# Patient Record
Sex: Female | Born: 1970 | Race: White | Hispanic: No | Smoking: Never smoker
Health system: Southern US, Community
[De-identification: ages and names within clinical notes are randomized; demographics above are authoritative.]

## PROBLEM LIST (undated history)

## (undated) DIAGNOSIS — E8881 Metabolic syndrome: Secondary | ICD-10-CM

## (undated) DIAGNOSIS — F329 Major depressive disorder, single episode, unspecified: Secondary | ICD-10-CM

## (undated) DIAGNOSIS — B0223 Postherpetic polyneuropathy: Secondary | ICD-10-CM

## (undated) DIAGNOSIS — I428 Other cardiomyopathies: Secondary | ICD-10-CM

## (undated) DIAGNOSIS — I509 Heart failure, unspecified: Secondary | ICD-10-CM

## (undated) DIAGNOSIS — T7840XA Allergy, unspecified, initial encounter: Secondary | ICD-10-CM

## (undated) DIAGNOSIS — E119 Type 2 diabetes mellitus without complications: Secondary | ICD-10-CM

## (undated) DIAGNOSIS — Z5189 Encounter for other specified aftercare: Secondary | ICD-10-CM

## (undated) DIAGNOSIS — G43909 Migraine, unspecified, not intractable, without status migrainosus: Secondary | ICD-10-CM

## (undated) DIAGNOSIS — E118 Type 2 diabetes mellitus with unspecified complications: Secondary | ICD-10-CM

## (undated) DIAGNOSIS — R9430 Abnormal result of cardiovascular function study, unspecified: Secondary | ICD-10-CM

## (undated) DIAGNOSIS — M21619 Bunion of unspecified foot: Secondary | ICD-10-CM

## (undated) DIAGNOSIS — E785 Hyperlipidemia, unspecified: Secondary | ICD-10-CM

## (undated) DIAGNOSIS — K7581 Nonalcoholic steatohepatitis (NASH): Secondary | ICD-10-CM

## (undated) DIAGNOSIS — E669 Obesity, unspecified: Secondary | ICD-10-CM

## (undated) DIAGNOSIS — R000 Tachycardia, unspecified: Secondary | ICD-10-CM

## (undated) DIAGNOSIS — K219 Gastro-esophageal reflux disease without esophagitis: Secondary | ICD-10-CM

## (undated) DIAGNOSIS — F3289 Other specified depressive episodes: Secondary | ICD-10-CM

## (undated) DIAGNOSIS — D239 Other benign neoplasm of skin, unspecified: Secondary | ICD-10-CM

## (undated) DIAGNOSIS — M1990 Unspecified osteoarthritis, unspecified site: Secondary | ICD-10-CM

## (undated) HISTORY — DX: Other specified depressive episodes: F32.89

## (undated) HISTORY — DX: Major depressive disorder, single episode, unspecified: F32.9

## (undated) HISTORY — DX: Nonalcoholic steatohepatitis (NASH): K75.81

## (undated) HISTORY — DX: Unspecified osteoarthritis, unspecified site: M19.90

## (undated) HISTORY — DX: Abnormal result of cardiovascular function study, unspecified: R94.30

## (undated) HISTORY — DX: Bunion of unspecified foot: M21.619

## (undated) HISTORY — DX: Type 2 diabetes mellitus with unspecified complications: E11.8

## (undated) HISTORY — PX: OTHER SURGICAL HISTORY: SHX169

## (undated) HISTORY — DX: Allergy, unspecified, initial encounter: T78.40XA

## (undated) HISTORY — DX: Metabolic syndrome: E88.810

## (undated) HISTORY — DX: Encounter for other specified aftercare: Z51.89

## (undated) HISTORY — DX: Gastro-esophageal reflux disease without esophagitis: K21.9

## (undated) HISTORY — DX: Type 2 diabetes mellitus without complications: E11.9

## (undated) HISTORY — DX: Other cardiomyopathies: I42.8

## (undated) HISTORY — DX: Tachycardia, unspecified: R00.0

## (undated) HISTORY — PX: CARDIAC CATHETERIZATION: SHX172

## (undated) HISTORY — DX: Heart failure, unspecified: I50.9

## (undated) HISTORY — DX: Metabolic syndrome: E88.81

## (undated) HISTORY — DX: Other benign neoplasm of skin, unspecified: D23.9

## (undated) HISTORY — DX: Obesity, unspecified: E66.9

## (undated) HISTORY — PX: BUNIONECTOMY: SHX129

## (undated) HISTORY — DX: Hyperlipidemia, unspecified: E78.5

## (undated) HISTORY — DX: Migraine, unspecified, not intractable, without status migrainosus: G43.909

---

## 1999-02-20 DIAGNOSIS — I428 Other cardiomyopathies: Secondary | ICD-10-CM

## 1999-02-20 HISTORY — DX: Other cardiomyopathies: I42.8

## 2005-04-04 ENCOUNTER — Ambulatory Visit: Payer: Self-pay | Admitting: Family Medicine

## 2005-04-10 ENCOUNTER — Ambulatory Visit: Payer: Self-pay | Admitting: Family Medicine

## 2005-06-22 ENCOUNTER — Ambulatory Visit: Payer: Self-pay | Admitting: Cardiology

## 2006-01-16 ENCOUNTER — Ambulatory Visit: Payer: Self-pay | Admitting: Cardiology

## 2006-02-12 ENCOUNTER — Ambulatory Visit: Payer: Self-pay

## 2006-02-12 ENCOUNTER — Encounter: Payer: Self-pay | Admitting: Cardiology

## 2006-02-15 ENCOUNTER — Ambulatory Visit: Payer: Self-pay | Admitting: Cardiology

## 2006-03-01 ENCOUNTER — Ambulatory Visit: Payer: Self-pay | Admitting: Family Medicine

## 2007-03-07 ENCOUNTER — Ambulatory Visit: Payer: Self-pay | Admitting: Cardiology

## 2007-04-10 ENCOUNTER — Telehealth (INDEPENDENT_AMBULATORY_CARE_PROVIDER_SITE_OTHER): Payer: Self-pay | Admitting: *Deleted

## 2007-05-23 LAB — CONVERTED CEMR LAB

## 2008-05-06 ENCOUNTER — Ambulatory Visit: Payer: Self-pay | Admitting: Cardiology

## 2008-05-14 ENCOUNTER — Encounter: Payer: Self-pay | Admitting: Cardiology

## 2008-05-14 ENCOUNTER — Ambulatory Visit: Payer: Self-pay

## 2008-05-14 ENCOUNTER — Ambulatory Visit: Payer: Self-pay | Admitting: Cardiology

## 2008-05-14 LAB — CONVERTED CEMR LAB
AST: 26 units/L (ref 0–37)
Bilirubin, Direct: 0.1 mg/dL (ref 0.0–0.3)
CO2: 27 meq/L (ref 19–32)
Chloride: 104 meq/L (ref 96–112)
Creatinine, Ser: 0.6 mg/dL (ref 0.4–1.2)
GFR calc Af Amer: 145 mL/min
GFR calc non Af Amer: 120 mL/min
Potassium: 4.1 meq/L (ref 3.5–5.1)
Triglycerides: 360 mg/dL (ref 0–149)
VLDL: 72 mg/dL — ABNORMAL HIGH (ref 0–40)

## 2008-06-11 ENCOUNTER — Ambulatory Visit: Payer: Self-pay | Admitting: Cardiology

## 2008-07-16 ENCOUNTER — Encounter (INDEPENDENT_AMBULATORY_CARE_PROVIDER_SITE_OTHER): Payer: Self-pay | Admitting: *Deleted

## 2008-07-17 ENCOUNTER — Ambulatory Visit: Payer: Self-pay | Admitting: Cardiology

## 2008-08-31 ENCOUNTER — Telehealth: Payer: Self-pay | Admitting: Cardiology

## 2008-10-12 ENCOUNTER — Ambulatory Visit: Payer: Self-pay | Admitting: Cardiology

## 2008-10-12 DIAGNOSIS — E8770 Fluid overload, unspecified: Secondary | ICD-10-CM | POA: Insufficient documentation

## 2008-10-13 LAB — CONVERTED CEMR LAB
BUN: 14 mg/dL (ref 6–23)
CO2: 27 meq/L (ref 19–32)
Calcium: 9.5 mg/dL (ref 8.4–10.5)
Chloride: 107 meq/L (ref 96–112)
Creatinine, Ser: 0.8 mg/dL (ref 0.4–1.2)
Glucose, Bld: 151 mg/dL — ABNORMAL HIGH (ref 70–99)
Potassium: 4.6 meq/L (ref 3.5–5.1)
Sodium: 140 meq/L (ref 135–145)

## 2008-10-14 DIAGNOSIS — R072 Precordial pain: Secondary | ICD-10-CM

## 2008-10-15 ENCOUNTER — Ambulatory Visit: Payer: Self-pay | Admitting: Cardiology

## 2008-10-15 DIAGNOSIS — I498 Other specified cardiac arrhythmias: Secondary | ICD-10-CM

## 2009-01-11 ENCOUNTER — Encounter: Payer: Self-pay | Admitting: Cardiology

## 2009-01-12 ENCOUNTER — Ambulatory Visit: Payer: Self-pay | Admitting: Cardiology

## 2009-04-09 ENCOUNTER — Encounter (INDEPENDENT_AMBULATORY_CARE_PROVIDER_SITE_OTHER): Payer: Self-pay | Admitting: *Deleted

## 2009-07-09 ENCOUNTER — Ambulatory Visit: Payer: Self-pay | Admitting: Cardiology

## 2009-07-16 LAB — CONVERTED CEMR LAB
Basophils Absolute: 0.1 10*3/uL (ref 0.0–0.1)
Creatinine, Ser: 0.7 mg/dL (ref 0.4–1.2)
GFR calc non Af Amer: 99.61 mL/min (ref >60)
HCT: 41.5 % (ref 36.0–46.0)
Hemoglobin: 14 g/dL (ref 12.0–15.0)
MCHC: 33.7 g/dL (ref 30.0–36.0)
MCV: 90.7 fL (ref 78.0–100.0)
Monocytes Relative: 6 % (ref 3.0–12.0)
Platelets: 361 10*3/uL (ref 150.0–400.0)
Potassium: 4.5 meq/L (ref 3.5–5.1)
RDW: 13.7 % (ref 11.5–14.6)
Sodium: 138 meq/L (ref 135–145)
WBC: 13.6 10*3/uL — ABNORMAL HIGH (ref 4.5–10.5)

## 2009-07-22 ENCOUNTER — Ambulatory Visit (HOSPITAL_COMMUNITY): Admission: RE | Admit: 2009-07-22 | Discharge: 2009-07-22 | Payer: Self-pay | Admitting: Cardiology

## 2009-07-22 ENCOUNTER — Ambulatory Visit: Payer: Self-pay | Admitting: Internal Medicine

## 2009-07-22 ENCOUNTER — Ambulatory Visit: Payer: Self-pay

## 2009-07-22 ENCOUNTER — Encounter: Payer: Self-pay | Admitting: Cardiology

## 2009-08-11 ENCOUNTER — Encounter: Payer: Self-pay | Admitting: Cardiology

## 2009-08-12 ENCOUNTER — Ambulatory Visit: Payer: Self-pay | Admitting: Cardiology

## 2009-08-12 LAB — CONVERTED CEMR LAB
BUN: 11 mg/dL (ref 6–23)
CO2: 30 meq/L (ref 19–32)
Calcium: 9.1 mg/dL (ref 8.4–10.5)
Chloride: 102 meq/L (ref 96–112)
Creatinine, Ser: 0.8 mg/dL (ref 0.4–1.2)
GFR calc non Af Amer: 85.34 mL/min (ref >60)
Glucose, Bld: 153 mg/dL — ABNORMAL HIGH (ref 70–99)
Potassium: 4.2 meq/L (ref 3.5–5.1)
Sodium: 140 meq/L (ref 135–145)

## 2009-10-07 ENCOUNTER — Ambulatory Visit: Payer: Self-pay | Admitting: Internal Medicine

## 2009-10-07 DIAGNOSIS — E669 Obesity, unspecified: Secondary | ICD-10-CM

## 2009-10-07 DIAGNOSIS — G43909 Migraine, unspecified, not intractable, without status migrainosus: Secondary | ICD-10-CM

## 2009-10-07 DIAGNOSIS — E785 Hyperlipidemia, unspecified: Secondary | ICD-10-CM | POA: Insufficient documentation

## 2009-10-07 DIAGNOSIS — M129 Arthropathy, unspecified: Secondary | ICD-10-CM | POA: Insufficient documentation

## 2009-10-07 DIAGNOSIS — F329 Major depressive disorder, single episode, unspecified: Secondary | ICD-10-CM

## 2009-10-07 DIAGNOSIS — Z9189 Other specified personal risk factors, not elsewhere classified: Secondary | ICD-10-CM | POA: Insufficient documentation

## 2009-10-07 DIAGNOSIS — E8881 Metabolic syndrome: Secondary | ICD-10-CM

## 2009-10-08 LAB — CONVERTED CEMR LAB
Cholesterol: 202 mg/dL — ABNORMAL HIGH (ref 0–200)
Direct LDL: 132 mg/dL
HDL: 36.9 mg/dL — ABNORMAL LOW (ref >39.00)
Hgb A1c MFr Bld: 6.3 % (ref 4.6–6.5)
Rheumatoid fact SerPl-aCnc: 23.7 intl units/mL — ABNORMAL HIGH (ref 0.0–20.0)
TSH: 2.81 microintl units/mL (ref 0.35–5.50)
Total CHOL/HDL Ratio: 5
Triglycerides: 366 mg/dL — ABNORMAL HIGH (ref 0.0–149.0)
VLDL: 73.2 mg/dL — ABNORMAL HIGH (ref 0.0–40.0)

## 2009-12-22 ENCOUNTER — Telehealth: Payer: Self-pay | Admitting: Cardiology

## 2009-12-23 ENCOUNTER — Ambulatory Visit: Payer: Self-pay | Admitting: Internal Medicine

## 2009-12-24 LAB — CONVERTED CEMR LAB
AST: 22 units/L (ref 0–37)
Albumin: 3.9 g/dL (ref 3.5–5.2)
Cholesterol: 167 mg/dL (ref 0–200)
Direct LDL: 96.5 mg/dL
Total CHOL/HDL Ratio: 5
Total Protein: 7.2 g/dL (ref 6.0–8.3)
Triglycerides: 271 mg/dL — ABNORMAL HIGH (ref 0.0–149.0)
VLDL: 54.2 mg/dL — ABNORMAL HIGH (ref 0.0–40.0)

## 2009-12-27 ENCOUNTER — Ambulatory Visit: Payer: Self-pay | Admitting: Internal Medicine

## 2010-01-10 ENCOUNTER — Ambulatory Visit: Payer: Self-pay | Admitting: Internal Medicine

## 2010-01-10 DIAGNOSIS — D239 Other benign neoplasm of skin, unspecified: Secondary | ICD-10-CM | POA: Insufficient documentation

## 2010-01-10 LAB — CONVERTED CEMR LAB: Hgb A1c MFr Bld: 6.2 % (ref 4.6–6.5)

## 2010-04-26 ENCOUNTER — Telehealth: Payer: Self-pay | Admitting: Internal Medicine

## 2010-05-22 HISTORY — PX: BUNIONECTOMY: SHX129

## 2010-06-21 NOTE — Assessment & Plan Note (Signed)
Summary: NEW/ MEDICARE/MEDCOST/[place]  #   Vital Signs:  Patient profile:   40 year old female Height:      67 inches (170.18 cm) Weight:      256.8 pounds (116.73 kg) O2 Sat:      94 % on Room air Temp:     97.1 degrees F (36.17 degrees C) oral Pulse rate:   96 / minute BP sitting:   100 / 72  (left arm) Cuff size:   large  Vitals Entered By: Tomma Lightning (Oct 07, 2009 9:20 AM)  O2 Flow:  Room air CC: New patient Is Patient Diabetic? No Pain Assessment Patient in pain? no        Primary Care Provider:  Rowe Clack MD  CC:  New patient.  History of Present Illness: new pt to me and our division, here to est care -  1) dyslipidemia - hx high TG 04/2008 - no f/u since then - not faithful with low fat diet - never on med or otc tx for same -   2) arthritis - onset >6 mos ago - pain is worse with cold weather and activity - pain affects bilateral MCP and bilateral knees R>L - no trauma or injury recalled, not assoc with any swelling- +FH both RA(mom) and OA (g-mom) - pain improved with "gloves" while knitting to keep hands warm and occ tylenol use  3) hyperglycemia - ?metabolic syndrome - +FH DM but never personally dx - weight stable, not exercising - no PU or PD -   4) CM hx - follows with cards for same - no edema or SOB - no recent need for med changes  5) depression - follows with psyc provider for same - reports compliance with ongoing medical treatment and no changes in medication dose or frequency. denies adverse side effects related to current therapy. no si or sadness   Preventive Screening-Counseling & Management  Alcohol-Tobacco     Alcohol drinks/day: <1     Alcohol Counseling: not indicated; use of alcohol is not excessive or problematic     Smoking Status: never     Tobacco Counseling: not indicated; no tobacco use  Caffeine-Diet-Exercise     Diet Counseling: to improve diet; diet is suboptimal     Nutrition Referrals: no     Does Patient Exercise:  no     Exercise Counseling: to improve exercise regimen     Depression Counseling: not indicated; screening negative for depression  Safety-Violence-Falls     Seat Belt Use: yes     Seat Belt Counseling: not indicated; patient wears seat belts     Helmet Counseling: not indicated; patient wears helmet when riding bicycle/motocycle     Firearms in the Home: no firearms in the home     Firearm Counseling: not applicable     Smoke Detectors: yes     Smoke Detector Counseling: no     Violence Counseling: not indicated; no violence risk noted     Fall Risk Counseling: not indicated; no significant falls noted  Clinical Review Panels:  Prevention   Last Pap Smear:  Interpretation/ Result:Negative for intraepithelial Lesion or Malignancy.    (05/23/2007)  Immunizations   Last Tetanus Booster:  Historical (05/22/2008)   Last Flu Vaccine:  HISTORICAL (04/10/2007)   Last Pneumovax:  Historical (05/23/1999)  Lipid Management   Cholesterol:  191 (05/14/2008)   LDL (bad choesterol):  DEL (05/14/2008)   HDL (good cholesterol):  34.9 (05/14/2008)  CBC  WBC:  13.6 (07/09/2009)   RBC:  4.57 (07/09/2009)   Hgb:  14.0 (07/09/2009)   Hct:  41.5 (07/09/2009)   Platelets:  361.0 (07/09/2009)   MCV  90.7 (07/09/2009)   MCHC  33.7 (07/09/2009)   RDW  13.7 (07/09/2009)   PMN:  61.2 (07/09/2009)   Lymphs:  29.7 (07/09/2009)   Monos:  6.0 (07/09/2009)   Eosinophils:  2.3 (07/09/2009)   Basophil:  0.8 (07/09/2009)  Complete Metabolic Panel   Glucose:  153 (08/12/2009)   Sodium:  140 (08/12/2009)   Potassium:  4.2 (08/12/2009)   Chloride:  102 (08/12/2009)   CO2:  30 (08/12/2009)   BUN:  11 (08/12/2009)   Creatinine:  0.8 (08/12/2009)   Albumin:  3.4 (05/14/2008)   Total Protein:  6.8 (05/14/2008)   Calcium:  9.1 (08/12/2009)   Total Bili:  0.7 (05/14/2008)   Alk Phos:  61 (05/14/2008)   SGPT (ALT):  25 (05/14/2008)   SGOT (AST):  26 (05/14/2008)   Current Medications  (verified): 1)  Furosemide 80 Mg Tabs (Furosemide) .... Take 1 Tablet By Mouth Once A Day 2)  Digitek 0.125 Mg  Tabs (Digoxin) .... Take One Tablet By Mouth Once Daily 3)  Lisinopril 20 Mg Tabs (Lisinopril) .... Take One Tablet By Mouth Daily 4)  Spironolactone 25 Mg Tabs (Spironolactone) .... Take One Tablet By Mouth Daily 5)  Carvedilol 25 Mg Tabs (Carvedilol) .... Take Two Tablets By Mouth Twice A Day 6)  Calcium Carbonate-Vitamin D 600-400 Mg-Unit  Tabs (Calcium Carbonate-Vitamin D) .... Once Daily 7)  Vitamin C 500 Mg  Tabs (Ascorbic Acid) .... Once Daily 8)  Fluoxetine Hcl 40 Mg Caps (Fluoxetine Hcl) .... 2 Tabs Once Daily 9)  Lamictal 200 Mg Tabs (Lamotrigine) .... Once Daily 10)  Klor-Con M20 20 Meq Cr-Tabs (Potassium Chloride Crys Cr) .Marland Kitchen.. 1 Tab Once Daily 11)  Zyrtec Allergy 10 Mg Tabs (Cetirizine Hcl) .... Once Daily 12)  Glucosamine-Chondroitin   Caps (Glucosamine-Chondroit-Vit C-Mn) .... Take One Tablet By Mouth Two Times A Day  Allergies: 1)  ! Sulfa  Past History:  Past Medical History: Migraines Cardiomyopathy. EF 20% in October 2000,  improved over time.echocardiogram    September 2007-EF 50-55%---EF 04/2008 .Marland Kitchen35-40%  /  EF 40-45%...echo...07/22/2009 Chest pain 2007 with normal Cardiolite. History of normal coronary arteries in 2000 Depression arthritis -hands, knees r>l metabolic syndrome - hypertriglycerides 04/2008, hyperglycemia  [facility] rooster; cards - [facility] - [facility] psyc -polus - [facility]  Past Surgical History: Denies surgical history  Family History: Family History of Arthritis (parent, grandparent) Family History Diabetes 1st degree relative (dad, mom) Family History High cholesterol (parent) Family History Hypertension (parent) Family History Ovarian cancer (mom, benign mass)  mom expired age 44 - [place], DM - sepsis from lymphedema? dad expired age 93 - [place], smoker, DM, bipolar schizophrenia  Social History: Never Smoked married, lives with  spouse, dtr and college friend rare alcohol use disabled Smoking Status:  never Does Patient Exercise:  no Seat Belt Use:  yes  Review of Systems       see HPI above. I have reviewed all other systems and they were negative.   Physical Exam  General:  overweight-appearing.  alert, well-developed, well-nourished, and cooperative to examination.    Head:  Normocephalic and atraumatic without obvious abnormalities. No apparent alopecia or balding. Eyes:  vision grossly intact; pupils equal, round and reactive to light.  conjunctiva and lids normal.    Ears:  normal pinnae bilaterally, without erythema,  swelling, or tenderness to palpation. TMs clear, without effusion, or cerumen impaction. Hearing grossly normal bilaterally  Mouth:  teeth and gums in good repair; mucous membranes moist, without lesions or ulcers. oropharynx clear without exudate, no erythema.  Neck:  supple, full ROM, no masses, no thyromegaly; no thyroid nodules or tenderness. no JVD or carotid bruits.   Lungs:  normal respiratory effort, no intercostal retractions or use of accessory muscles; normal breath sounds bilaterally - no crackles and no wheezes.    Heart:  normal rate, regular rhythm, no murmur, and no rub. BLE without edema. normal DP pulses and normal cap refill in all 4 extremities    Abdomen:  soft, non-tender, normal bowel sounds, no distention; no masses and no appreciable hepatomegaly or splenomegaly.   Genitalia:  defer to gyn Msk:  very mild MCP boggy changed 2/3 on left hand - no other effusiions - no warmth or erythema - FROM with ext and flexion - bilater knee: full range of motion, no joint effusion or swelling. no erythema or abnormal warmth. Stable to ligamentous testing. Nontender to palpation. Neurovascularly intact.  Neurologic:  alert & oriented X3 and cranial nerves II-XII symetrically intact.  strength normal in all extremities, sensation intact to light touch, and gait normal. speech fluent  without dysarthria or aphasia; follows commands with good comprehension.  Skin:  no rashes, vesicles, ulcers, or erythema. No nodules or irregularity to palpation.  Psych:  Oriented X3, memory intact for recent and remote, normally interactive, good eye contact, not anxious appearing, not depressed appearing, and not agitated.      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 see below - Time spent with patient 4) minutes, more than 50% of this time was spent counseling patient onsyndrome + need for diet and exercise with weight loss to control same (even if meds needed for clarified dx depending on lab results)  Problem # 2:  DYSLIPIDEMIA (ICD-272.4)  Orders: TLB-Lipid Panel (80061-LIPID)  Labs Reviewed: SGOT: 26 (05/14/2008)   SGPT: 25 (05/14/2008)   HDL:34.9 (05/14/2008)  LDL:DEL (05/14/2008)  Chol:191 (05/14/2008)  Trig:360 (05/14/2008)  Problem # 3:  HYPERGLYCEMIA, BORDERLINE (ICD-790.29)  Orders: TLB-A1C / Hgb A1C (Glycohemoglobin) (83036-A1C)  Labs Reviewed: Creat: 0.8 (08/12/2009)     Problem # 4:  OBESITY (ICD-278.00)  Orders: TLB-TSH (Thyroid Stimulating Hormone) (84443-TSH)  Ht: 67 (10/07/2009)   Wt: 256.8 (10/07/2009)   BMI: 41.18 (08/12/2009)  Problem # 5:  ARTHRITIS (ICD-716.90) exam benign but note FH RA - suspect mild DJD if any - check xray now and labs cont tylenol and "warmth/gloves" to maximize conserv mgmt at this time avoid NSAIDs given CM and CHF hx Orders: TLB-Rheumatoid Factor (RA) (86431-RA) T-Hand Left 3 Views (73130TC) T-Hand Right 3 views (73130TC) T-Knee Left 2 view (73560TC) T-Knee Right 2 view (73560TC)  Problem # 6:  DEPRESSION (ICD-311) well controlled -  cont same med and counseling as per psyc providers Her updated medication list for this problem includes:    Fluoxetine Hcl 40 Mg Caps (Fluoxetine hcl) .Marland Kitchen... 2 tabs once daily  Orders: TLB-TSH  (Thyroid Stimulating Hormone) (84443-TSH)  Problem # 7:  CARDIOMYOPATHY (ICD-425.4) per cards 07/2009 OV: Her updated medication list for this problem includes:    Furosemide 80 Mg Tabs (Furosemide) .Marland Kitchen... Take 1 tablet by mouth once a day    Digitek 0.125 Mg Tabs (Digoxin) .Marland Kitchen... Take one tablet by mouth once daily  Lisinopril 20 Mg Tabs (Lisinopril) .Marland Kitchen... Take one tablet by mouth daily    Spironolactone 25 Mg Tabs (Spironolactone) .Marland Kitchen... Take one tablet by mouth daily    Carvedilol 25 Mg Tabs (Carvedilol) .Marland Kitchen... Take two tablets by mouth twice a day The patient's followup echo revealed an ejection fraction of 40-45%.  I am pleased with his result.  We will continue all of her medications.  Complete Medication List: 1)  Furosemide 80 Mg Tabs (Furosemide) .... Take 1 tablet by mouth once a day 2)  Digitek 0.125 Mg Tabs (Digoxin) .... Take one tablet by mouth once daily 3)  Lisinopril 20 Mg Tabs (Lisinopril) .... Take one tablet by mouth daily 4)  Spironolactone 25 Mg Tabs (Spironolactone) .... Take one tablet by mouth daily 5)  Carvedilol 25 Mg Tabs (Carvedilol) .... Take two tablets by mouth twice a day 6)  Calcium Carbonate-vitamin D 600-400 Mg-unit Tabs (Calcium carbonate-vitamin d) .... Once daily 7)  Vitamin C 500 Mg Tabs (Ascorbic acid) .... Once daily 8)  Fluoxetine Hcl 40 Mg Caps (Fluoxetine hcl) .... 2 tabs once daily 9)  Lamictal 200 Mg Tabs (Lamotrigine) .... Once daily 10)  Klor-con M20 20 Meq Cr-tabs (Potassium chloride crys cr) .Marland Kitchen.. 1 tab once daily 11)  Zyrtec Allergy 10 Mg Tabs (Cetirizine hcl) .... Once daily 12)  Glucosamine-chondroitin Caps (Glucosamine-chondroit-vit c-mn) .... Take one tablet by mouth two times a day  Patient Instructions: 1)  it was good to see you today.  2)  test(s) ordered today - your results will be posted on the phone tree for review in 48-72 hours from the time of test completion; call [phone] and enter your 9 digit MRN (listed above on this  page, just below your name); if any changes need to be made or there are abnormal results, you will be contacted directly.  3)  no medications changes recommended at this time but will notify you if additions needed after reviewing labs - 4)  ok to use Tylenol as needed for arthritis pains 5)  it is important that you work on losing weight - monitor your diet and consume fewer calories such as less carbohydrates (sugar) and less fat. you also need to increase your physical activity level - start by walking for 10-20 minutes 3 times per week and work up to 30 minutes 4-5 times each week.  6)  Please schedule a follow-up appointment in 3 months, sooner if problems.    Immunization History:  Tetanus/Td Immunization History:    Tetanus/Td:  historical (05/22/2008)  Pneumovax Immunization History:    Pneumovax:  historical (05/23/1999)    Pap Smear  Procedure date:  05/23/2007  Findings:      Interpretation/ Result:Negative for intraepithelial Lesion or Malignancy.

## 2010-06-21 NOTE — Progress Notes (Signed)
Summary: simvastain  Phone Note Refill Request Message from:  Fax from Pharmacy on April 26, 2010 12:22 PM  Refills Requested: Medication #1:  SIMVASTATIN 20 MG TABS 1 by mouth at bedtime. [facility]/ [place] [phone]   Method Requested: Electronic Initial call taken by: Tomma Lightning RMA,  April 26, 2010 12:22 PM    Prescriptions: SIMVASTATIN 20 MG TABS (SIMVASTATIN) 1 by mouth at bedtime  #30 x 9   Entered by:   Tomma Lightning RMA   Authorized by:   Rowe Clack MD   Signed by:   Tomma Lightning RMA on 04/26/2010   Method used:   Electronically to        [facility] [phone]* (retail)       [place], [place]  [address]       Ph: [phone]       Fax: [phone]   RxID:   [id number]

## 2010-06-21 NOTE — Miscellaneous (Signed)
  Clinical Lists Changes  Observations: Added new observation of PAST MED HX: allergy to sulfa and tetanus Migraines Cardiomyopathy. Ejection fraction 20% in October 2000 by report. This improved over time.echocardiogram September 2007-ejection fraction 50-55%----  /  -EF 04/2008 .Marland Kitchen35-40%  /  EF 40-45%...echo...07/22/2009 Chest pain 2007 with normal Cardiolite. History of normal coronary arteries in 2000 (08/11/2009 10:17) Added new observation of PRIMARY MD: none (08/11/2009 10:17)       Past History:  Past Medical History: allergy to sulfa and tetanus Migraines Cardiomyopathy. Ejection fraction 20% in October 2000 by report. This improved over time.echocardiogram September 2007-ejection fraction 50-55%----  /  -EF 04/2008 .Marland Kitchen35-40%  /  EF 40-45%...echo...07/22/2009 Chest pain 2007 with normal Cardiolite. History of normal coronary arteries in 2000

## 2010-06-21 NOTE — Progress Notes (Signed)
Summary: Pt request call  Phone Note Call from Patient Call back at Home Phone [phone]   Caller: Patient Summary of Call: Request call Initial call taken by: Delsa Sale,  December 22, 2009 1:10 PM  Follow-up for Phone Call        pt has been having a "twinge" b/t right side b/t collar bone and shoulder, just last about 1 min., has only happened 3 times in past 2 weeks, also gets a "flash of warmth" over her body when she gets the twinge, no SOB, no palps, BP 111/75 advised did not sound cardiac recommended f/u w/Dr Asa Lente pt agreeable Kevan Rosebush, RN  December 22, 2009 5:38 PM

## 2010-06-21 NOTE — Assessment & Plan Note (Signed)
Summary: rov/jss   Visit Type:  Follow-up Primary Provider:  none  CC:  cardiomyopathy.  History of Present Illness: The patient is seen for followup of cardiomyopathy.  Her carvedilol dose is 50 mg b.i.d.  She tolerates this well. She has had some increased shortness of breath.  This is with exertion.  She has not had PND orthopnea.  She is not having any significant edema but she says she may be mildly swollen.  She does watch result in today.  She does drink extra fluid including water and she will cut back.   Current Medications (verified): 1)  Furosemide 80 Mg Tabs (Furosemide) .Marland Kitchen.. 1 in The Am, 1/2 in The Pm 2)  Digitek 0.125 Mg  Tabs (Digoxin) .... Take One Tablet By Mouth Once Daily 3)  Lisinopril 20 Mg Tabs (Lisinopril) .... Take One Tablet By Mouth Daily 4)  Spironolactone 25 Mg Tabs (Spironolactone) .... Take One Tablet By Mouth Daily 5)  Carvedilol 25 Mg Tabs (Carvedilol) .... Take Two Tablets By Mouth Twice A Day 6)  Calcium Carbonate-Vitamin D 600-400 Mg-Unit  Tabs (Calcium Carbonate-Vitamin D) .... Once Daily 7)  Vitamin C 500 Mg  Tabs (Ascorbic Acid) .... Once Daily 8)  Fluoxetine Hcl 40 Mg Caps (Fluoxetine Hcl) .... 2 Tabs Once Daily 9)  Lamictal 200 Mg Tabs (Lamotrigine) .... Once Daily 10)  Klor-Con M20 20 Meq Cr-Tabs (Potassium Chloride Crys Cr) .Marland Kitchen.. 1 Tab Once Daily 11)  Zyrtec Allergy 10 Mg Tabs (Cetirizine Hcl) .... Once Daily 12)  Glucosamine-Chondroitin   Caps (Glucosamine-Chondroit-Vit C-Mn) .... Take One Tablet By Mouth Once Daily.  Allergies (verified): 1)  ! Sulfa 2)  ! * Tetanus  Past History:  Past Medical History: Last updated: 10/14/2008 allergy to sulfa and tetanus Migraines Cardiomyopathy. Ejection fraction 20% in October 2000 by report. This improved over time.echocardiogram September 2007-ejection fraction 50-55%-----EF 04/2008 .Marland Kitchen35-40% Chest pain 2007 with normal Cardiolite. History of normal coronary arteries in 2000  Review of Systems      Patient denies fever, chills, headache, sweats, rash, change in vision, change in hearing, chest pain, cough, nausea or vomiting, urinary symptoms, musculoskeletal problems.  All other systems are reviewed and are negative.  Vital Signs:  Patient profile:   40 year old female Height:      67 inches Weight:      264 pounds BMI:     41.50 Pulse rate:   90 / minute BP sitting:   112 / 68  (left arm) Cuff size:   regular  Vitals Entered By: Mignon Pine, RMA (July 09, 2009 9:59 AM)  Physical Exam  General:  patient is stable.  She is overweight. Head:  head is atraumatic. Eyes:  no xanthelasma. Neck:  no jugular venous distention. Chest Wall:  no chest wall tenderness. Lungs:  lungs are clear.  Respiratory effort is nonlabored. Heart:  cardiac exam reveals S1-S2.  No clicks or significant murmurs Abdomen:  abdomen is obese but soft. Msk:  no musculoskeletal deformities. Extremities:  there may be trace peripheral edema Skin:  no skin rashes. Psych:  patient is oriented to person time and place.  Affect is normal.   Impression & Recommendations:  Problem # 1:  SINUS TACHYCARDIA (ICD-427.89)  Her updated medication list for this problem includes:    Lisinopril 20 Mg Tabs (Lisinopril) .Marland Kitchen... Take one tablet by mouth daily    Carvedilol 25 Mg Tabs (Carvedilol) .Marland Kitchen... Take two tablets by mouth twice a day The patient's heart rate is 90  today despite carvedilol 50 mg b.i.d.  We will check thyroid functions.  This is the dose that is recommended.  I will not plan to push higher at this point.  Problem # 2:  CARDIOMYOPATHY (ICD-425.4)  Her updated medication list for this problem includes:    Furosemide 80 Mg Tabs (Furosemide) .Marland Kitchen... 1 in the am, 1/2 in the pm    Digitek 0.125 Mg Tabs (Digoxin) .Marland Kitchen... Take one tablet by mouth once daily    Lisinopril 20 Mg Tabs (Lisinopril) .Marland Kitchen... Take one tablet by mouth daily    Spironolactone 25 Mg Tabs (Spironolactone) .Marland Kitchen... Take one tablet  by mouth daily    Carvedilol 25 Mg Tabs (Carvedilol) .Marland Kitchen... Take two tablets by mouth twice a day It is time now to recheck the patient's 2-D echo.  Based on the finding I will continue to adjust her medications.  Her current shortness of breath may be related to volume overload.  Orders: TLB-BMP (Basic Metabolic Panel-BMET) ([id number]) TLB-CBC Platelet - w/Differential (85025-CBCD) TLB-TSH (Thyroid Stimulating Hormone) (84443-TSH) Echocardiogram (Echo)  Problem # 3:  PRECORDIAL PAIN (ICD-786.51)  Her updated medication list for this problem includes:    Lisinopril 20 Mg Tabs (Lisinopril) .Marland Kitchen... Take one tablet by mouth daily    Carvedilol 25 Mg Tabs (Carvedilol) .Marland Kitchen... Take two tablets by mouth twice a day The patient has not been having any chest.  Problem # 4:  FLUID OVERLOAD (ICD-276.6)  The patient's current shortness of breath may be mild volume overload.  We will start by cutting down this fluid.  I will not plan to push her diuretic as of today.  We will check a CBC, chemistry, thyroid functions.  She'll have a 2-D echo and I'll see her for followup.  Orders: TLB-BMP (Basic Metabolic Panel-BMET) ([id number]) TLB-CBC Platelet - w/Differential (85025-CBCD) TLB-TSH (Thyroid Stimulating Hormone) (84443-TSH) Echocardiogram (Echo)  Patient Instructions: 1)  Labs today--bmet, cbc, tsh 2)  Your physician has requested that you limit your fluid intake to    per day. 3)  Your physician has requested that you have an echocardiogram.  Echocardiography is a painless test that uses sound waves to create images of your heart. It provides your doctor with information about the size and shape of your heart and how well your heart's chambers and valves are working.  This procedure takes approximately one hour. There are no restrictions for this procedure. 4)  Follow up in 2 weeks

## 2010-06-21 NOTE — Assessment & Plan Note (Signed)
Summary: 2wk f/u sl   Visit Type:  Follow-up Primary Provider:  none  CC:  cardiomyopathy.  History of Present Illness: The patient is seen for followup of cardiomyopathy and fluid overload.  I saw her last July 09, 2009.  At that time we decided to proceed with a followup 2-D echo to reassess LV function.  Plan was also made for her to decrease her salt and fluid intake.  Since then she has decreased her fluid intake and in addition she increased her Lasix.  She feels much better.  We had checked labs on that day and her renal function was good.  We will need to check her chemistries again today and she is on a higher dose of diuretics.  Other labs showed that her hemoglobin was normal and her TSH was normal.  Current Medications (verified): 1)  Furosemide 80 Mg Tabs (Furosemide) .... Take 1 Tablet By Mouth Once A Day 2)  Digitek 0.125 Mg  Tabs (Digoxin) .... Take One Tablet By Mouth Once Daily 3)  Lisinopril 20 Mg Tabs (Lisinopril) .... Take One Tablet By Mouth Daily 4)  Spironolactone 25 Mg Tabs (Spironolactone) .... Take One Tablet By Mouth Daily 5)  Carvedilol 25 Mg Tabs (Carvedilol) .... Take Two Tablets By Mouth Twice A Day 6)  Calcium Carbonate-Vitamin D 600-400 Mg-Unit  Tabs (Calcium Carbonate-Vitamin D) .... Once Daily 7)  Vitamin C 500 Mg  Tabs (Ascorbic Acid) .... Once Daily 8)  Fluoxetine Hcl 40 Mg Caps (Fluoxetine Hcl) .... 2 Tabs Once Daily 9)  Lamictal 200 Mg Tabs (Lamotrigine) .... Once Daily 10)  Klor-Con M20 20 Meq Cr-Tabs (Potassium Chloride Crys Cr) .Marland Kitchen.. 1 Tab Once Daily 11)  Zyrtec Allergy 10 Mg Tabs (Cetirizine Hcl) .... Once Daily 12)  Glucosamine-Chondroitin   Caps (Glucosamine-Chondroit-Vit C-Mn) .... Take One Tablet By Mouth Two Times A Day  Allergies (verified): 1)  ! Sulfa 2)  ! * Tetanus  Past History:  Past Medical History: Last updated: 08/11/2009 allergy to sulfa and tetanus Migraines Cardiomyopathy. Ejection fraction 20% in October 2000 by  report. This improved over time.echocardiogram September 2007-ejection fraction 50-55%----  /  -EF 04/2008 .Marland Kitchen35-40%  /  EF 40-45%...echo...07/22/2009 Chest pain 2007 with normal Cardiolite. History of normal coronary arteries in 2000  Review of Systems       Patient denies fever, chills, headache, sweats, rash, change in vision, change in hearing, chest pain, cough, shortness of breath, nausea vomiting, urinary symptoms.  All other systems are reviewed and are negative.  Vital Signs:  Patient profile:   40 year old female Height:      67 inches Weight:      262 pounds BMI:     41.18 Pulse rate:   85 / minute BP sitting:   104 / 66  (left arm) Cuff size:   regular  Vitals Entered By: Mignon Pine, RMA (August 12, 2009 9:31 AM)  Physical Exam  General:  patient is quite stable today and feeling well. Eyes:  no xanthelasma. Neck:  no jugular venous distention. Lungs:  lungs are clear.  Respiratory effort is nonlabored. Heart:  cardiac exam reveals S1-S2.  No clicks or significant murmurs. Abdomen:  abdomen is soft. Extremities:  no peripheral edema. Psych:  patient is oriented to person time and place.  Affect is normal.   Impression & Recommendations:  Problem # 1:  SINUS TACHYCARDIA (ICD-427.89)  Her updated medication list for this problem includes:    Lisinopril 20 Mg Tabs (Lisinopril) .Marland KitchenMarland KitchenMarland KitchenMarland Kitchen  Take one tablet by mouth daily    Carvedilol 25 Mg Tabs (Carvedilol) .Marland Kitchen... Take two tablets by mouth twice a day The patient is on high-dose carvedilol.  Her rate is reasonable.  We will not push the dose higher.  Problem # 2:  CARDIOMYOPATHY (ICD-425.4)  Her updated medication list for this problem includes:    Furosemide 80 Mg Tabs (Furosemide) .Marland Kitchen... Take 1 tablet by mouth once a day    Digitek 0.125 Mg Tabs (Digoxin) .Marland Kitchen... Take one tablet by mouth once daily    Lisinopril 20 Mg Tabs (Lisinopril) .Marland Kitchen... Take one tablet by mouth daily    Spironolactone 25 Mg Tabs (Spironolactone)  .Marland Kitchen... Take one tablet by mouth daily    Carvedilol 25 Mg Tabs (Carvedilol) .Marland Kitchen... Take two tablets by mouth twice a day The patient's followup echo revealed an ejection fraction of 40-45%.  I am pleased with his result.  We will continue all of her medications.  Orders: TLB-BMP (Basic Metabolic Panel-BMET) (12258-TMMITVI)  Problem # 3:  FLUID OVERLOAD (ICD-276.6) Fluid status is under much better control.  Chemistry rechecked today to be sure that her renal function and potassium were stable.  Six-month followup.  Patient Instructions: 1)  Lab today 2)  Follow up in 6 months

## 2010-06-21 NOTE — Assessment & Plan Note (Signed)
Summary: f/u appt/#//cd   Vital Signs:  Patient profile:   40 year old female Height:      64 inches (162.56 cm) Weight:      246.6 pounds (112.09 kg) O2 Sat:      96 % on Room air Temp:     98.3 degrees F (36.83 degrees C) oral Pulse rate:   89 / minute BP sitting:   100 / 60  (left arm) Cuff size:   large  Vitals Entered By: Tomma Lightning RMA (January 10, 2010 1:44 PM)  O2 Flow:  Room air CC: follow-up visit Is Patient Diabetic? Yes Did you bring your meter with you today? No Pain Assessment Patient in pain? no        Primary Care Provider:  Rowe Clack MD  CC:  follow-up visit.  History of Present Illness: here for f/u  1) dyslipidemia - hx high TG 04/2008 - now faithful with low fat diet - started on statin 09/2009 for same - reports compliance with ongoing medical treatment and no changes in medication dose or frequency. denies adverse side effects related to current therapy.   2) arthritis - onset >6 mos ago - pain is worse with cold weather and activity - pain affects bilateral MCP and bilateral knees R>L - no trauma or injury recalled, not assoc with any swelling- +FH both RA(mom) and OA (g-mom) - pain improved with "gloves" while knitting to keep hands warm and occ tylenol use  3) hyperglycemia - metabolic syndrome - +FH DM but never personally dx - ongoing weight loss efforts with noted success, ongoing exercise - no PU or PD -   4) CM hx - follows with cards for same - no edema or SOB - no recent need for med changes  5) depression - follows with psyc provider for same - reports compliance with ongoing medical treatment and no changes in medication dose or frequency. denies adverse side effects related to current therapy. no si or sadness   Current Medications (verified): 1)  Furosemide 80 Mg Tabs (Furosemide) .... Take 1 Tablet By Mouth Once A Day 2)  Digitek 0.125 Mg  Tabs (Digoxin) .... Take One Tablet By Mouth Once Daily 3)  Lisinopril 20 Mg Tabs  (Lisinopril) .... Take One Tablet By Mouth Daily 4)  Spironolactone 25 Mg Tabs (Spironolactone) .... Take One Tablet By Mouth Daily 5)  Carvedilol 25 Mg Tabs (Carvedilol) .... Take Two Tablets By Mouth Twice A Day 6)  Calcium Carbonate-Vitamin D 600-400 Mg-Unit  Tabs (Calcium Carbonate-Vitamin D) .... Once Daily 7)  Vitamin C 500 Mg  Tabs (Ascorbic Acid) .... Once Daily 8)  Fluoxetine Hcl 40 Mg Caps (Fluoxetine Hcl) .... 2 Tabs Once Daily 9)  Lamictal 200 Mg Tabs (Lamotrigine) .... Once Daily 10)  Klor-Con M20 20 Meq Cr-Tabs (Potassium Chloride Crys Cr) .Marland Kitchen.. 1 Tab Once Daily 11)  Zyrtec Allergy 10 Mg Tabs (Cetirizine Hcl) .... Once Daily 12)  Glucosamine-Chondroitin   Caps (Glucosamine-Chondroit-Vit C-Mn) .... Take One Tablet By Mouth Two Times A Day 13)  Simvastatin 20 Mg Tabs (Simvastatin) .Marland Kitchen.. 1 By Mouth At Bedtime  Allergies (verified): 1)  ! Sulfa  Past History:  Past Medical History: Migraines Cardiomyopathy. EF 20% in October 2000,  improved over time.echocardiogram    September 2007-EF 50-55%---EF 04/2008 .Marland Kitchen35-40%  /  EF 40-45%...echo...07/22/2009 Chest pain 2007 with normal Cardiolite. History of normal coronary arteries in 2000 Depression arthritis -hands, knees r>l metabolic syndrome - hypertriglycerides 04/2008, hyperglycemia  Md  roster; cards - Haematologist gyn - dorn psyc -polus - [facility]  Review of Systems  The patient denies weight gain, chest pain, syncope, and headaches.         c/o hair loss and changing mole on left chest  Physical Exam  General:  overweight-appearing.  alert, well-developed, well-nourished, and cooperative to examination.    Lungs:  normal respiratory effort, no intercostal retractions or use of accessory muscles; normal breath sounds bilaterally - no crackles and no wheezes.    Heart:  normal rate, regular rhythm, no murmur, and no rub. BLE without edema. normal DP pulses and normal cap refill in all 4 extremities    Skin:  fried egg mole on  left anterior chest - also skin tags right axillea Psych:  Oriented X3, memory intact for recent and remote, normally interactive, good eye contact, not anxious appearing, not depressed appearing, and not agitated.      Impression & Recommendations:  Problem # 1:  DYSLIPIDEMIA (ICD-272.4) Assessment Improved  labs reviewed - improved on statin and with life style changes - cont same Her updated medication list for this problem includes:    Simvastatin 20 Mg Tabs (Simvastatin) .Marland Kitchen... 1 by mouth at bedtime  Orders: TLB-TSH (Thyroid Stimulating Hormone) (84443-TSH) TLB-A1C / Hgb A1C (Glycohemoglobin) (83036-A1C)  Labs Reviewed: SGOT: 22 (12/23/2009)   SGPT: 25 (12/23/2009)   HDL:34.20 (12/23/2009), 36.90 (10/07/2009)  LDL:DEL (05/14/2008)  Chol:167 (12/23/2009), 202 (10/07/2009)  Trig:271.0 (12/23/2009), 366.0 (10/07/2009)  Problem # 2:  OBESITY (ICD-278.00) congrats on weight loss provided Orders: TLB-TSH (Thyroid Stimulating Hormone) (84443-TSH) TLB-A1C / Hgb A1C (Glycohemoglobin) (83036-A1C)  Orders: TLB-TSH (Thyroid Stimulating Hormone) (84443-TSH)  Ht: 67 (10/07/2009)   Wt: 256.8 (10/07/2009)   BMI: 41.18 (08/12/2009)  Ht: 64 (01/10/2010)   Wt: 246.6 (01/10/2010)   BMI: 41.18 (08/12/2009)  Problem # 3:  DIABETES MELLITUS, TYPE II, CONTROLLED, MILD (ICD-250.00) diet controlled - recheck a1c now Her updated medication list for this problem includes:    Lisinopril 20 Mg Tabs (Lisinopril) .Marland Kitchen... Take one tablet by mouth daily  Orders: TLB-TSH (Thyroid Stimulating Hormone) (84443-TSH) TLB-A1C / Hgb A1C (Glycohemoglobin) (83036-A1C)  Labs Reviewed: Creat: 0.8 (08/12/2009)    Reviewed HgBA1c results: 6.3 (10/07/2009)  Problem # 4:  BENIGN NEOPLASM OF SKIN SITE UNSPECIFIED (ICD-216.9)  Orders: Dermatology Referral (Derma)  Complete Medication List: 1)  Furosemide 80 Mg Tabs (Furosemide) .... Take 1 tablet by mouth once a day 2)  Digitek 0.125 Mg Tabs (Digoxin) .... Take  one tablet by mouth once daily 3)  Lisinopril 20 Mg Tabs (Lisinopril) .... Take one tablet by mouth daily 4)  Spironolactone 25 Mg Tabs (Spironolactone) .... Take one tablet by mouth daily 5)  Carvedilol 25 Mg Tabs (Carvedilol) .... Take two tablets by mouth twice a day 6)  Calcium Carbonate-vitamin D 600-400 Mg-unit Tabs (Calcium carbonate-vitamin d) .... Once daily 7)  Vitamin C 500 Mg Tabs (Ascorbic acid) .... Once daily 8)  Fluoxetine Hcl 40 Mg Caps (Fluoxetine hcl) .... 2 tabs once daily 9)  Lamictal 200 Mg Tabs (Lamotrigine) .... Once daily 10)  Klor-con M20 20 Meq Cr-tabs (Potassium chloride crys cr) .Marland Kitchen.. 1 tab once daily 11)  Zyrtec Allergy 10 Mg Tabs (Cetirizine hcl) .... Once daily 12)  Glucosamine-chondroitin Caps (Glucosamine-chondroit-vit c-mn) .... Take one tablet by mouth two times a day 13)  Simvastatin 20 Mg Tabs (Simvastatin) .Marland Kitchen.. 1 by mouth at bedtime  Patient Instructions: 1)  it was good to see you today. 2)  test(s) ordered  today - your results will be posted on the phone tree for review in 48-72 hours from the time of test completion; call [phone] and enter your 9 digit MRN (listed above on this page, just below your name); if any changes need to be made or there are abnormal results, you will be contacted directly.  3)  we'll make referral dermatology as discussed. Our office will contact you regarding this appointment once made.  4)  keep up the good work on your diet, exercise and weight loss! 5)  labs reviewed - cholesterol is better and liver is good ! 6)  Please schedule a follow-up appointment in 6 months to review cholesterol, weight, etc; call sooner if problems.

## 2010-07-11 ENCOUNTER — Ambulatory Visit: Payer: Self-pay | Admitting: Internal Medicine

## 2010-08-02 ENCOUNTER — Other Ambulatory Visit: Payer: PRIVATE HEALTH INSURANCE

## 2010-08-02 ENCOUNTER — Encounter: Payer: Self-pay | Admitting: Internal Medicine

## 2010-08-02 ENCOUNTER — Ambulatory Visit (INDEPENDENT_AMBULATORY_CARE_PROVIDER_SITE_OTHER)
Admission: RE | Admit: 2010-08-02 | Discharge: 2010-08-02 | Disposition: A | Discharge status: Home / Self Care | Payer: PRIVATE HEALTH INSURANCE | Source: Ambulatory Visit | Attending: Internal Medicine | Admitting: Internal Medicine

## 2010-08-02 ENCOUNTER — Other Ambulatory Visit: Payer: Self-pay | Admitting: Internal Medicine

## 2010-08-02 ENCOUNTER — Ambulatory Visit (INDEPENDENT_AMBULATORY_CARE_PROVIDER_SITE_OTHER): Payer: PRIVATE HEALTH INSURANCE | Admitting: Internal Medicine

## 2010-08-02 DIAGNOSIS — E785 Hyperlipidemia, unspecified: Secondary | ICD-10-CM

## 2010-08-02 DIAGNOSIS — E119 Type 2 diabetes mellitus without complications: Secondary | ICD-10-CM

## 2010-08-02 DIAGNOSIS — M79609 Pain in unspecified limb: Secondary | ICD-10-CM | POA: Insufficient documentation

## 2010-08-02 DIAGNOSIS — Z79899 Other long term (current) drug therapy: Secondary | ICD-10-CM

## 2010-08-02 DIAGNOSIS — H9190 Unspecified hearing loss, unspecified ear: Secondary | ICD-10-CM | POA: Insufficient documentation

## 2010-08-02 DIAGNOSIS — M21619 Bunion of unspecified foot: Secondary | ICD-10-CM | POA: Insufficient documentation

## 2010-08-02 LAB — LIPID PANEL
Cholesterol: 120 mg/dL (ref 0–200)
HDL: 26.5 mg/dL — ABNORMAL LOW (ref >39.00)
Triglycerides: 215 mg/dL — ABNORMAL HIGH (ref 0.0–149.0)

## 2010-08-02 LAB — HEMOGLOBIN A1C: Hgb A1c MFr Bld: 6.2 % (ref 4.6–6.5)

## 2010-08-02 LAB — HEPATIC FUNCTION PANEL
ALT: 25 U/L (ref 0–35)
AST: 21 U/L (ref 0–37)
Total Bilirubin: 1 mg/dL (ref 0.3–1.2)
Total Protein: 7 g/dL (ref 6.0–8.3)

## 2010-08-02 LAB — CREATININE, SERUM: Creatinine, Ser: 0.7 mg/dL (ref 0.4–1.2)

## 2010-08-02 LAB — LDL CHOLESTEROL, DIRECT: Direct LDL: 64.6 mg/dL

## 2010-08-09 NOTE — Assessment & Plan Note (Signed)
Summary: 6 MTH FU-STC   Vital Signs:  Patient profile:   40 year old female Height:      64 inches Weight:      249.50 pounds BMI:     42.98 O2 Sat:      97 % on Room air Temp:     97.8 degrees F oral Pulse rate:   95 / minute BP sitting:   124 / 62  (left arm) Cuff size:   regular  Vitals Entered By: Crissie Sickles, CMA (August 02, 2010 10:01 AM)  O2 Flow:  Room air CC: 27mh follow up/DBD   Primary Care Provider:  VRowe ClackMD  CC:  682m follow up/DBD.  History of Present Illness: here for f/u  1) dyslipidemia - hx high TG 04/2008 - now faithful with low fat diet - started on statin 09/2009 for same - reports compliance with ongoing medical treatment and no changes in medication dose or frequency. denies adverse side effects related to current therapy.   2) arthritis  - pain is worse with cold weather and activity - pain affects bilateral MCP and bilateral knees R>L - no trauma or injury recalled, not assoc with any swelling- +FH both RA(mom) and OA (g-mom) - pain improved with "gloves" while knitting to keep hands warm and occ tylenol use  3) DM2, diet controlled - metabolic syndrome - +FH DM - ongoing weight loss efforts (intermittent), ongoing exercise - no PU or PD -  does not check home cbg  4) CM hx - follows with cards for same - no edema or SOB - no recent need for med changes  5) depression - follows with psyc provider for same - reports compliance with ongoing medical treatment and no changes in medication dose or frequency. denies adverse side effects related to current therapy. no si or sadness   Clinical Review Panels:  Lipid Management   Cholesterol:  167 (12/23/2009)   LDL (bad choesterol):  DEL (05/14/2008)   HDL (good cholesterol):  34.20 (12/23/2009)  Diabetes Management   HgBA1C:  6.2 (01/10/2010)   Creatinine:  0.8 (08/12/2009)   Last Flu Vaccine:  HISTORICAL (04/10/2007)   Last Pneumovax:  Historical  (05/23/1999)   Allergies: 1)  ! Sulfa  Past History:  Past Medical History: Migraines Cardiomyopathy. EF 20% in October 2000,  improved over time.echocardiogram    September 2007-EF 50-55%---EF 04/2008 ..3Marland Kitchen-40%  /  EF 40-45%...echo...07/22/2009 Chest pain 2007 with normal Cardiolite. History of normal coronary arteries in 2000 Depression  arthritis -hands, knees r>l metabolic syndrome - hypertriglycerides 04/2008, hyperglycemia DM2  Md roster: cards - kaRon Parkeryn - dorn psyc -polus - [facility]  Review of Systems       The patient complains of decreased hearing.  The patient denies chest pain, syncope, and headaches.         c/o B bunion irritation with closed shoes and r heel pain x 6 mo, no trauma.  Physical Exam  General:  overweight-appearing.  alert, well-developed, well-nourished, and cooperative to examination.    Lungs:  normal respiratory effort, no intercostal retractions or use of accessory muscles; normal breath sounds bilaterally - no crackles and no wheezes.    Heart:  normal rate, regular rhythm, no murmur, and no rub. BLE without edema. normal DP pulses and normal cap refill in all 4 extremities    Msk:  B bunions with erythema, no ulceration - R heel nontender to palp - achellies intact FROM w/o pain  Psych:  Oriented X3, memory intact for recent and remote, normally interactive, good eye contact, not anxious appearing, not depressed appearing, and not agitated.      Impression & Recommendations:  Problem # 1:  DIABETES MELLITUS, TYPE II, CONTROLLED, MILD (ICD-250.00)  Her updated medication list for this problem includes:    Lisinopril 20 Mg Tabs (Lisinopril) .Marland Kitchen... Take one tablet by mouth daily  Orders: TLB-A1C / Hgb A1C (Glycohemoglobin) (83036-A1C) TLB-Creatinine, Blood (82565-CREA)  diet controlled - recheck a1c now  Labs Reviewed: Creat: 0.8 (08/12/2009)    Reviewed HgBA1c results: 6.2 (01/10/2010)  6.3 (10/07/2009)  Problem # 2:   DYSLIPIDEMIA (ICD-272.4)  Her updated medication list for this problem includes:    Simvastatin 20 Mg Tabs (Simvastatin) .Marland Kitchen... 1 by mouth at bedtime  Orders: TLB-Lipid Panel (80061-LIPID)  labs reviewed - improved on statin begun 09/2009 and with life style changes - cont same  Labs Reviewed: SGOT: 22 (12/23/2009)   SGPT: 25 (12/23/2009)   HDL:34.20 (12/23/2009), 36.90 (10/07/2009)  LDL:DEL (05/14/2008)  Chol:167 (12/23/2009), 202 (10/07/2009)  Trig:271.0 (12/23/2009), 366.0 (10/07/2009)  Problem # 3:  BUNIONS, BILATERAL (ICD-727.1)  Orders: Podiatry Referral (Podiatry)  Problem # 4:  HEEL PAIN, RIGHT (ICD-729.5) probable PF (hx same) - check xray for spur or FB - reviewed exercise , NSAIDS and ice massage Orders: T-Foot Right (73630TC) Podiatry Referral (Podiatry)  Problem # 5:  HEARING LOSS (ICD-389.9)  Orders: Audiology (Audio)  Complete Medication List: 1)  Furosemide 80 Mg Tabs (Furosemide) .... Take 1 tablet by mouth once a day 2)  Digitek 0.125 Mg Tabs (Digoxin) .... Take one tablet by mouth once daily 3)  Lisinopril 20 Mg Tabs (Lisinopril) .... Take one tablet by mouth daily 4)  Spironolactone 25 Mg Tabs (Spironolactone) .... Take one tablet by mouth daily 5)  Carvedilol 25 Mg Tabs (Carvedilol) .... Take two tablets by mouth twice a day 6)  Calcium Carbonate-vitamin D 600-400 Mg-unit Tabs (Calcium carbonate-vitamin d) .... Once daily 7)  Vitamin C 500 Mg Tabs (Ascorbic acid) .... Once daily 8)  Fluoxetine Hcl 40 Mg Caps (Fluoxetine hcl) .... 2 tabs once daily 9)  Lamictal 200 Mg Tabs (Lamotrigine) .... Once daily 10)  Klor-con M20 20 Meq Cr-tabs (Potassium chloride crys cr) .Marland Kitchen.. 1 tab once daily 11)  Zyrtec Allergy 10 Mg Tabs (Cetirizine hcl) .... Once daily 12)  Glucosamine-chondroitin Caps (Glucosamine-chondroit-vit c-mn) .... Take one tablet by mouth two times a day 13)  Simvastatin 20 Mg Tabs (Simvastatin) .Marland Kitchen.. 1 by mouth at bedtime  Other  Orders: TLB-Hepatic/Liver Function Pnl (80076-HEPATIC)  Patient Instructions: 1)  it was good to see you today. 2)  test(s) ordered today - your results will be called to you after review in 48-72 hours from the time of test completion 3)  we'll make referral podiatry and audiology as discussed. Our office will contact you regarding this appointment once made.  4)  keep up the good work on your diet, exercise and weight loss! 5)  labs reviewed - cholesterol is better and liver is good ! 6)  Please schedule a follow-up appointment in 6 months to review cholesterol, diabetes and weight, etc; call sooner if problems.    Orders Added: 1)  TLB-Lipid Panel [80061-LIPID] 2)  TLB-A1C / Hgb A1C (Glycohemoglobin) [83036-A1C] 3)  TLB-Hepatic/Liver Function Pnl [80076-HEPATIC] 4)  TLB-Creatinine, Blood [82565-CREA] 5)  T-Foot Right [73630TC] 6)  Audiology [Audio] 7)  Podiatry Referral [Podiatry] 8)  Est. Patient Level IV [17616]

## 2010-08-15 ENCOUNTER — Other Ambulatory Visit: Payer: Self-pay | Admitting: Cardiology

## 2010-08-16 ENCOUNTER — Other Ambulatory Visit: Payer: Self-pay

## 2010-08-16 DIAGNOSIS — I428 Other cardiomyopathies: Secondary | ICD-10-CM

## 2010-08-16 MED ORDER — CARVEDILOL 25 MG PO TABS: 25.0000 mg | ORAL_TABLET | Freq: Two times a day (BID) | ORAL | Status: DC

## 2010-08-18 ENCOUNTER — Other Ambulatory Visit: Payer: Self-pay | Admitting: Cardiology

## 2010-08-18 NOTE — Telephone Encounter (Signed)
[address] °

## 2010-09-26 ENCOUNTER — Telehealth: Payer: Self-pay | Admitting: Cardiology

## 2010-09-26 NOTE — Telephone Encounter (Signed)
Pt states the office called in the wrong dose. Pt needs coreg 40m 2 tablet twice a day [facility]

## 2010-09-27 ENCOUNTER — Other Ambulatory Visit: Payer: Self-pay | Admitting: *Deleted

## 2010-09-27 ENCOUNTER — Other Ambulatory Visit: Payer: Self-pay | Admitting: Cardiology

## 2010-09-27 DIAGNOSIS — I428 Other cardiomyopathies: Secondary | ICD-10-CM

## 2010-09-27 MED ORDER — CARVEDILOL 25 MG PO TABS: 50.0000 mg | ORAL_TABLET | Freq: Two times a day (BID) | ORAL | Status: DC

## 2010-09-27 NOTE — Telephone Encounter (Signed)
Spoke with pharmacy they state the directions is correct they just need a quanity change to have enough pills for 1 month I corrected the dosage and gave pt #120 6 refills

## 2010-09-27 NOTE — Telephone Encounter (Signed)
Pt states she has called re her meds and no one has call in the correct meds to her pharmacy. Pt needs coreg 66m 2 tab twice a day to be called in to [facility].

## 2010-09-28 ENCOUNTER — Other Ambulatory Visit: Payer: Self-pay | Admitting: *Deleted

## 2010-09-28 DIAGNOSIS — I428 Other cardiomyopathies: Secondary | ICD-10-CM

## 2010-09-28 MED ORDER — SPIRONOLACTONE 25 MG PO TABS: ORAL_TABLET | ORAL | Status: DC

## 2010-09-29 ENCOUNTER — Encounter: Payer: Self-pay | Admitting: Family Medicine

## 2010-09-29 ENCOUNTER — Inpatient Hospital Stay (INDEPENDENT_AMBULATORY_CARE_PROVIDER_SITE_OTHER)
Admission: RE | Admit: 2010-09-29 | Discharge: 2010-09-29 | Disposition: A | Discharge status: Home / Self Care | Payer: PRIVATE HEALTH INSURANCE | Source: Ambulatory Visit | Attending: Family Medicine | Admitting: Family Medicine

## 2010-09-29 DIAGNOSIS — J069 Acute upper respiratory infection, unspecified: Secondary | ICD-10-CM

## 2010-10-04 NOTE — Assessment & Plan Note (Signed)
[facility]                            CARDIOLOGY OFFICE NOTE   GWENDOLYNN, MERKEY                     MRN:          [id number]  DATE:07/17/2008                            DOB:          12/17/1970    Ms. Badders is here for followup.  She has left ventricular dysfunction.  I have been adjusting her meds up.  We pushed her carvedilol up to 25  b.i.d. on June 14, 2008.  She is feeling better.  However, she still  has some mild relative resting tachycardia.  She is also feeling some  increase in volume.  She is on Lasix, but in the past she has been on a  higher dose.  She does not get edema.  However, historically she gets a  bloated sensation in her abdomen.  Her weight is up to 5 pounds since  the last visit.   PAST MEDICAL HISTORY:   ALLERGIES:  SULFA and TETANUS.   MEDICATIONS:  See the flow sheet.   REVIEW OF SYSTEMS:  She is not having any fevers or chills.  There is no  headaches.  There is no GI or GU symptoms.  There are no skin rashes.  Her review of systems is negative.   PHYSICAL EXAMINATION:  VITAL SIGNS:  Blood pressure is 96/72.  This is  stable for her.  Her pulse, however, is 91.  This has improved from the  last visit, but remains higher than I would like at rest.  GENERAL:  The patient is oriented to person, time, and place.  Affect is  normal.  HEENT:  No xanthelasma.  She has normal extraocular motion.  NECK:  There are no carotid bruits.  There is no jugular venous  distension.  LUNGS:  Clear.  Respiratory effort is not labored.  CARDIAC:  S1 with an S2.  There are no clicks or significant murmurs.  ABDOMEN:  Soft.  EXTREMITIES:  She has no significant peripheral edema.   Problems are listed completely on the note of May 06, 2008.  She  may be mildly volume overloaded.  Her Lasix dose will be increased to 80  b.i.d.  Her renal function was normal in December 2009.  She will obtain  a BMET before I see her  back.  Her carvedilol dose will also be  increased to 37.5 b.i.d.     Carlena Bjornstad, MD, [facility]  Electronically Signed    JDK/MedQ  DD: 07/17/2008  DT: 07/17/2008  Job #: [id number]

## 2010-10-04 NOTE — Assessment & Plan Note (Signed)
[facility]                            CARDIOLOGY OFFICE NOTE   Carolyn, Salazar                     MRN:          [id number]  DATE:05/06/2008                            DOB:          Apr 30, 1971    Carolyn Salazar is here for cardiology followup.  She had moved to this area  from [place], [place].  She had a history of LV dysfunction that was  thought to be severe, but ultimately this improved.  Her echo here in  September 2007, revealed that she had an ejection fraction in the 50-55%  range.  I had seen her in 2008 and tried to keep her on appropriate  medicines for her ventricle.  She is not having any chest pain.  She has  had a very difficult year and that she helped taking care of her mother  who was dying for a prolonged period of time.  Her mother did eventually  passed away.   PAST MEDICAL HISTORY:   ALLERGIES:  TETANUS and SULFA.   MEDICATIONS:  1. Furosemide 80.  2. Fluoxetine 80.  3. Lamictal 200.  4. Lisinopril 20.  5. Digoxin 0.125.  6. Spironolactone 25.  7. Zyrtec 10.  8. Vitamins.  9. Potassium 20.  10.Carvedilol 12.5 b.i.d.  11.Xanax.   OTHER MEDICAL PROBLEMS:  See the list below.   REVIEW OF SYSTEMS:  She is not having any headache or eye problems.  She  is not having any fevers or chills.  She has no GI or GU symptoms.  Her  review of systems otherwise is negative.   PHYSICAL EXAMINATION:  VITAL SIGNS:  Blood pressure is 99/74 with the  pulse of 102.  GENERAL:  The patient is oriented to person, time, and place.  Affect is  normal.  HEENT:  No xanthelasma.  She has normal extraocular motion.  There are  no carotid bruits.  There is no jugular venous distention.  LUNGS:  Clear.  Respiratory effort is not labored.  CARDIAC:  S1 with an S2.  There are no clicks or significant murmurs.  ABDOMEN:  Soft.  EXTREMITIES:  She has no significant peripheral edema.  The patient does  weight 257 pounds and she is significantly  overweight.   LABORATORY:  Revealed mild sinus tachycardia.   PROBLEMS:  1. History of allergy to SULFA and TETANUS.  2. Migraines, stable.  3. History of mid calf pain historically stable.  4. Question of her lipid status.  I had asked to arrange for a fasting      lipid when I saw her last year, but this did not happen.  We will      arrange for it at this time.  5. History of a cardiomyopathy with ejection fraction 20% in the past      which then improved.  I am concerned that she had mild resting      sinus tachycardia.  It has been 2 years since her last echo.  We      will do a 2-D echo to reassess.  She will also have thyroid  functions.  6. History of chest pain in 2007.  She has Cardiolite with no ischemia      at that time.  The patient is on medications as listed and has had      no labs done.  She needs BMET and a digoxin level and this will all      be done.  I will then see her in followup.     Carlena Bjornstad, MD, [facility]  Electronically Signed    JDK/MedQ  DD: 05/06/2008  DT: 05/06/2008  Job #: [id number]

## 2010-10-04 NOTE — Assessment & Plan Note (Signed)
[facility]                            CARDIOLOGY OFFICE NOTE   KALIKA, SMAY                     MRN:          [id number]  DATE:03/07/2007                            DOB:          [birth year]    Ms. Meiser is doing very well.  In [place], [place], before she came  here, she had a history of left ventricular dysfunction.  Fortunately,  this improved.  She then had some chest pain in September of 2007 and we  evaluated her with a Myoview scan.  This study showed no significant  ischemia.  We did a followup echo to be sure that her left ventricular  function was remaining good.  This study showed her ejection fraction to  be in the 55% range.  She continues on the appropriate medicines and she  is doing well.  She has not had any chest pain.  There has been no  syncope or pre-syncope.   PAST MEDICAL HISTORY:   ALLERGIES:  SULFA, TETANUS.   MEDICATIONS:  Furosemide.  Fluoxetine.  Lamictal.  Lisinopril.  Carvedilol.  Digoxin.  Spironolactone.  Zyrtec.  Vitamins.  Potassium.  P.r.n. Xanax.   OTHER MEDICAL PROBLEMS:  See the list below.   REVIEW OF SYSTEMS:  She is actually doing well and her review of systems  today is negative.   PHYSICAL EXAM:  Weight is 250 pounds, down 2 pounds since September of  2007.  Blood pressure 110/64 with a pulse of 96.  She is not taking her  full dose of carvedilol and I have pushed this back up.  HEENT:  No xanthelasma.  She has normal extraocular motions.  There are no carotid bruits.  There is no jugular venous distension.  LUNGS:  Clear.  Respiratory effort is not labored.  CARDIAC:  Reveals an S1 with an S2.  There are no clicks or significant  murmurs.  ABDOMEN:  Obese, but soft.  She has no peripheral edema.  There are no musculoskeletal deformities.   EKG is normal, but her resting rate is 95.   PROBLEMS:  1. History of allergy to SULFA and TETANUS.  2. Migraines.  3. History of mid calf pain  historically.  4. History of mild gallop.  5. Question of her lipid status.  We will ask for a fasting lipid      profile.  6. Cardiomyopathy.  Ejection fraction 20% in October of 2000 by report      and this improved.  She was actually considered for a transplant      elsewhere, but she improved and she has remained stable.  7. Chest pain in 2007 with normal Cardiolite and this has stabilized.      She did have normal coronary arteries in 2000.   I will see her back in 1 year for cardiology followup.  We will request  a fasting lipid.  She is going to go back to the recommended dose of  carvedilol.     Carlena Bjornstad, MD, [facility]  Electronically Signed    JDK/MedQ  DD: 03/07/2007  DT: 03/08/2007  Job #:  [id number] 

## 2010-10-07 NOTE — Assessment & Plan Note (Signed)
Carolyn Salazar OFFICE NOTE   Carolyn Salazar, Carolyn Salazar                     MRN:          [id number]  DATE:06/14/2008                            DOB:          November 22, 1970    I saw Carolyn Salazar on May 06, 2008.  She had some increased resting  heart rate.  She had a history previously of decreased LV function that  did improve.  We decided to recheck all of her status.  Her TSH was  normal.  BMET was normal.  Triglycerides were 360 with HDL 35 and LDL  102.  I am not changing her meds at this point.  She will begin to lose  weight and we will look further into these meds.  Her dig level was 0.1.  This dose can be increased.  However, the main goal will be to increase  her carvedilol at this time because her 2-D echo reveals decreased LV  function.   The patient is feeling some fatigue and some shortness of breath.  She  has cut back on her salt intake and she is feeling better.   PAST MEDICAL HISTORY:   ALLERGIES:  TETANUS and SULFA.   MEDICATIONS:  See the flow sheet.   REVIEW OF SYSTEMS:  She is not having any GI or GU symptoms.  She has no  headaches, fevers, or chills.  There are no rashes.  Otherwise, her  review of systems is negative.   PHYSICAL EXAMINATION:  VITAL SIGNS:  Blood pressure is 110/76 with a  pulse of 96.  GENERAL:  The patient is oriented to person, time, and place.  Affect is  normal.  HEENT:  No xanthelasma.  She has normal extraocular motion.  NECK:  There are no carotid bruits.  There is no jugular venous  distention.  LUNGS:  Clear.  Respiratory effort is not labored.  CARDIAC:  S1 with an S2.  There are no clicks or significant murmurs.  ABDOMEN:  Soft.  EXTREMITIES:  She has no peripheral edema.   PROBLEMS:  Listed on the note of May 06, 2008.  #4.  Lipid status.  We do need to address this further, but will not  change her medications as of today.  #5.  History of  cardiomyopathy that improved in the past.  Her ejection  fraction now has to come down somewhat compare to the prior information  that I have and her ejection fraction is 35-40%.  She is on many of the  appropriate medications.  We will increase her carvedilol dose from 12.5  b.i.d. to 25 b.i.d. as for the first step and that I will see her back  for followup.   I will see her back in 2-3 weeks for the next step.     Carolyn Bjornstad, MD, [facility]  Electronically Signed    JDK/MedQ  DD: 06/11/2008  DT: 06/11/2008  Job #: [id number]

## 2010-10-07 NOTE — Assessment & Plan Note (Signed)
[facility]                              CARDIOLOGY OFFICE NOTE   ABRIANNA, SIDMAN                     MRN:          [id number]  DATE:02/15/2006                            DOB:          1971/05/16    Ms. Michie was seen in the office on January 16, 2006.  She had been in  [place], and she felt poorly.  She had a Cardiolite scan there, and the  ejection fraction was 54%.  There was question of some reversible anterior  ischemia.  She was stable, and she was seen back, and when I saw her she was  doing well.  I decided to do a followup 2D echo to be sure her LV function  was remaining normal.  We know she has normal coronaries from 2000.  The  echo study was done, and she is now seen back in followup.   The echo study showed that her ejection fraction was 50% to 55%.  There was  a question of possible hypokinesis at the apex, seen mostly in the apical  views.   Today she returns, and she is feeling well.   See the prior note in the chart for her medications and her allergies.   The patient appears to be stable.   PROBLEMS:  1. Cardiomyopathy.  This was noted in 2000, with an ejection fraction of      20%.  It improved and she is stable.  2. Recent chest pain.  I am not inclined to assess any further.  I will      see her for followup in a year.            ______________________________  Carlena Bjornstad, MD, [facility]     JDK/MedQ  DD:  02/15/2006  DT:  02/17/2006  Job #:  [id number]

## 2010-10-07 NOTE — Assessment & Plan Note (Signed)
[facility]                              CARDIOLOGY OFFICE NOTE   CHAKARA, BOGNAR                     MRN:          [id number]  DATE:01/16/2006                            DOB:          [birth year]    Carolyn Salazar is seen for cardiology followup.  I had seen her in February  2007 with an extensive evaluation and note.  The patient recently was in  [place], [place], moving her mother to this area.  She had some chest  discomfort.  She was seen and kept in an emergency room there overnight and  eventually had a Cardiolite scan with an ejection fraction of 54%.  There  was question of reversible anterior ischemia.  Her markers were negative and  she was allowed to be discharged and she is now here for followup.  Since  that time she has been stable.  She does have some shortness of breath and  fatigue with the recent high heat and humidity.  It is important to note  that originally in 2000, the patient had a nonischemic cardiomyopathy.  Catheterization was normal and on medications she responded and her ejection  fraction normalized historically.   PAST MEDICAL HISTORY:   ALLERGIES:  SULFA and TETANUS.   MEDICATIONS:  1. Furosemide 80.  2. Fluoxetine 80.  3. Lamictal 200.  4. Lisinopril 20.  5. Coreg 12.5 b.i.d.  6. Digoxin 0.125.  7  Spironolactone 25.  1. Zyrtec 10.  2. Calcium.  3. Vitamin C.  4. Zinc.  5. Trazodone.  6. KCL 20.   OTHER MEDICAL PROBLEMS:  See the list below.   REVIEW OF SYSTEMS:  Other than some recent shortness of breath, her review  of systems is negative.   PHYSICAL EXAMINATION:  VITAL SIGNS:  Blood pressure today 116/68.  Her rate  is 90.  Patient is significantly overweight at 252 pounds.  GENERAL:  Patient is oriented to person, time and place and her affect is  normal.  LUNGS:  Clear.  Respiratory effort is not labored.  HEENT:  Reveals no xanthelasma.  She has normal extraocular motion.  CARDIAC:   Reveals an S1 and S2.  There are no clicks or significant murmurs.  ABDOMEN:  Soft but obese.  EXTREMITIES:  She has no significant peripheral edema.   EKG reveals non-specific ST-T wave changes.  There is no marked change from  the past.   PROBLEMS:  Include:  1. History of an allergy to SULFA and TETANUS.  2. Migraines.  3. History of some mild calf pain historically.  4. History of mild gout.  5. Some anxiety and depression.  6. History of a renal stone.  7. History of some asthma.  8. Elevated low-density lipoprotein.  9. Cardiomyopathy.  As described in October of 2000, she had ejection      fraction of 20%.  This improved over time.  She was actually considered      for transplant.  She had a myocardial biopsy that was non-diagnostic.      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  Fortunately,  she      improved and historically her ejection fraction increased to 60%.  By      nuclear scan recently in [place], her ejection fraction was 56%.  10.Recent chest pain with emergency room visit in [place].   Overall, I think Ms. Guercio is stable.  However, she is fatigued and short  of breath at times.  We will reassess her left ventricle function very  carefully with 2-D echo and then I will see her back.  At this time I plan  not to change her medications.  We will also consider approach to her  lipids.  As noted above the patient did have a Cardiolite scan raising the  question of anterior ischemia.  It is possible this could be from breast  attenuation.  This finding will be kept in mind.  However, with normal  coronaries in 2000, I have chosen not to repeat studies at this time.  I  will see her back after her echo is done.                               Carlena Bjornstad, MD, [facility]    JDK/MedQ  DD:  01/16/2006  DT:  01/17/2006  Job #:  [id number]

## 2010-10-07 NOTE — Assessment & Plan Note (Signed)
[facility]                                   ON-CALL NOTE   TEYANA, PIERRON                     MRN:          [id number]  DATE:12/22/2005                            DOB:          1970-08-03    While DOD today, I got a call from the [facility] emergency room about  Carolyn Salazar.  She is a patient of Dr. Ron Parker.  She is 40 years old.  She has a history of nonischemic cardiomyopathy, diagnosed in 2000.  She had  a normal cardiac catheterization at that time.  Subsequently, her  cardiomyopathy has resolved with her most recent ejection fraction of 60%.  She was in [place] helping her mother move and she developed some atypical  chest pain.  She had a Cardiolite out there which showed an EF of 54% and a  question of reversible anterior defect concerning for ischemia.  Her chest  pain has resolved.  She has had negative cardiac markers, and she is being  planned for discharge.  They called [place] to give [place] the information and make  sure we were okay with the plan.  Given her normal coronary anatomy on  recent cardiac catheterization, I suspect she may have shifting breast  attenuation on her nuclear study.  I said if Ms. Petrovic is asymptomatic,  then I felt that she was okay to be discharged and I have asked her to  follow up with Dr. Ron Parker as soon as she gets back from [facility].  Of  course should she have recurrent pain, she will need to report immediately  to the emergency room.                                   Shaune Pascal. Bensimhon, MD   DRB/MedQ  DD:  12/22/2005  DT:  12/22/2005  Job #:  [id number]

## 2010-11-30 ENCOUNTER — Other Ambulatory Visit: Payer: Self-pay | Admitting: Cardiology

## 2010-12-05 ENCOUNTER — Other Ambulatory Visit: Payer: Self-pay | Admitting: Cardiology

## 2011-01-31 ENCOUNTER — Telehealth: Payer: Self-pay | Admitting: *Deleted

## 2011-01-31 ENCOUNTER — Other Ambulatory Visit (INDEPENDENT_AMBULATORY_CARE_PROVIDER_SITE_OTHER): Payer: PRIVATE HEALTH INSURANCE

## 2011-01-31 ENCOUNTER — Ambulatory Visit (INDEPENDENT_AMBULATORY_CARE_PROVIDER_SITE_OTHER): Payer: PRIVATE HEALTH INSURANCE | Admitting: Internal Medicine

## 2011-01-31 ENCOUNTER — Other Ambulatory Visit: Payer: Self-pay | Admitting: Internal Medicine

## 2011-01-31 ENCOUNTER — Encounter: Payer: Self-pay | Admitting: Internal Medicine

## 2011-01-31 VITALS — BP 90/62 | HR 76 | Temp 98.3°F | Ht 64.0 in | Wt 241.6 lb

## 2011-01-31 DIAGNOSIS — Z79899 Other long term (current) drug therapy: Secondary | ICD-10-CM

## 2011-01-31 DIAGNOSIS — E119 Type 2 diabetes mellitus without complications: Secondary | ICD-10-CM

## 2011-01-31 DIAGNOSIS — E669 Obesity, unspecified: Secondary | ICD-10-CM

## 2011-01-31 DIAGNOSIS — E785 Hyperlipidemia, unspecified: Secondary | ICD-10-CM

## 2011-01-31 DIAGNOSIS — I428 Other cardiomyopathies: Secondary | ICD-10-CM

## 2011-01-31 DIAGNOSIS — Z23 Encounter for immunization: Secondary | ICD-10-CM

## 2011-01-31 LAB — HEPATIC FUNCTION PANEL
AST: 23 U/L (ref 0–37)
Albumin: 4.4 g/dL (ref 3.5–5.2)
Alkaline Phosphatase: 76 U/L (ref 39–117)
Total Protein: 7.8 g/dL (ref 6.0–8.3)

## 2011-01-31 LAB — LIPID PANEL
Cholesterol: 153 mg/dL (ref 0–200)
VLDL: 51.4 mg/dL — ABNORMAL HIGH (ref 0.0–40.0)

## 2011-01-31 LAB — HEMOGLOBIN A1C: Hgb A1c MFr Bld: 6 % (ref 4.6–6.5)

## 2011-01-31 MED ORDER — SIMVASTATIN 20 MG PO TABS: 20.0000 mg | ORAL_TABLET | Freq: Every day | ORAL | Status: DC

## 2011-01-31 NOTE — Assessment & Plan Note (Signed)
Started simvastatin 09/2009 - tolerating well Check lipids/LFT now and adjust as needed

## 2011-01-31 NOTE — Telephone Encounter (Signed)
Pt is here early for appt want to know does md want to have bloodwork done. Want to go down now appt is @ 10:00am..Marland Kitchen9/11/12@8 :54am/LMB

## 2011-01-31 NOTE — Assessment & Plan Note (Signed)
Dx 02/1999 - managed by cards for same - Tachycardia controlled with beta-blocker but would reduce ACEI due to low blood pressure and fatigue Pt will discuss with Dr. Ron Parker before changing med

## 2011-01-31 NOTE — Telephone Encounter (Signed)
Note not seen before now - labs ordered at [facility] - thx

## 2011-01-31 NOTE — Progress Notes (Signed)
Subjective:    Patient ID: Carolyn Salazar, female    DOB: 01/10/1971, 40 y.o.   MRN: [id number]  HPI  here for follow up - reviewed chronic medical issues  dyslipidemia - hx high TG 04/2008 - now faithful with low fat diet - started on statin 09/2009 for same - reports compliance with ongoing medical treatment and no changes in medication dose or frequency.  denies adverse side effects related to current therapy.   arthritis - pain is worse with cold weather and activity - pain affects bilateral MCP and bilateral knees R>L - no trauma or injury recalled, not assoc with any swelling- +FH both RA(mom) and OA (g-mom) - pain improved with "gloves" while knitting to keep hands warm and occ tylenol use   DM2, diet controlled - metabolic syndrome - +FH DM - ongoing weight loss efforts (intermittent), ongoing exercise - no PU or PD - does not check home cbg   CM hx 02/1999 - follows with cards for same - no edema or SOB - no recent need for med changes   depression - follows with psyc provider for same - reports compliance with ongoing medical treatment and no changes in medication dose or frequency. denies adverse side effects related to current therapy. no si or sadness   Past Medical History  Diagnosis Date  . BUNIONS, BILATERAL   . CARDIOMYOPATHY 02/1999    EF 20% in 02/1999, improved over time. Echo 01/2006 - EF 50-55% EF 04/2008 35-40% EF 40-45% Echo 07/22/2009  . MIGRAINE HEADACHE   . DEPRESSION   . OBESITY   . 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 DYSLIPIDEMIA   . DIABETES MELLITUS, TYPE II, CONTROLLED, MILD   . BENIGN NEOPLASM OF SKIN SITE UNSPECIFIED   . Arthritis     Hands, Knees RT>LT    Review of Systems  Constitutional: Negative for unexpected weight change.  Respiratory: Negative for cough and shortness of breath.   Cardiovascular: Negative for chest pain and palpitations.       Objective:   Physical Exam BP 90/62  Pulse 76   Temp(Src) 98.3 F (36.8 C) (Oral)  Ht 5' 4"  (1.626 m)  Wt 241 lb 9.6 oz (109.589 kg)  BMI 41.47 kg/m2  SpO2 97% Wt Readings from Last 3 Encounters:  01/31/11 241 lb 9.6 oz (109.589 kg)  08/02/10 249 lb 8 oz (113.172 kg)  01/10/10 246 lb 9.6 oz (111.857 kg)   Constitutional: She is overweight. She appears well-developed and well-nourished. No distress.   Neck: Normal range of motion. Neck supple. No JVD present. No thyromegaly present.  Cardiovascular: Normal rate, regular rhythm and normal heart sounds.  No murmur heard. No BLE edema. Pulmonary/Chest: Effort normal and breath sounds normal. No respiratory distress. She has no wheezes.  Musculoskeletal: Normal range of motion, no joint effusions. No gross deformities Neurological: She is alert and oriented to person, place, and time. No cranial nerve deficit. Coordination normal.  Skin: Skin is warm and dry. No rash noted. No erythema.  Psychiatric: She has a normal mood and affect. Her behavior is normal. Judgment and thought content normal.   Lab Results  Component Value Date   WBC 13.6* 07/09/2009   HGB 14.0 07/09/2009   HCT 41.5 07/09/2009   PLT 361.0 07/09/2009   CHOL 120 08/02/2010   TRIG 215.0* 08/02/2010   HDL 26.50* 08/02/2010   LDLDIRECT 64.6 08/02/2010   ALT 25 08/02/2010   AST 21 08/02/2010  NA 140 08/12/2009   K 4.2 08/12/2009   CL 102 08/12/2009   CREATININE 0.7 08/02/2010   BUN 11 08/12/2009   CO2 30 08/12/2009   TSH 3.17 01/10/2010   HGBA1C 6.2 08/02/2010       Assessment & Plan:  See problem list. Medications and labs reviewed today.

## 2011-01-31 NOTE — Assessment & Plan Note (Signed)
Diet controlled -  Check a1c and adjust as needed Lab Results  Component Value Date   HGBA1C 6.2 08/02/2010

## 2011-01-31 NOTE — Patient Instructions (Signed)
It was good to see you today. We have reviewed your prior records including labs and tests today Consider reducing dose of lisinopril to 30m daily if ok with Dr. KRon Parkerdue to your low blood pressure  Other Medications reviewed, no changes at this time. Test(s) ordered today. Your results will be called to you after review (48-72hours after test completion). If any changes need to be made, you will be notified at that time. Please schedule followup in 6 months to monitor diabetes, cholesterol and weight, call sooner if problems.

## 2011-01-31 NOTE — Assessment & Plan Note (Signed)
Weight loss trend continues - diet with weight watchers and exercise ongoing Encouragement provided re: same

## 2011-02-01 ENCOUNTER — Telehealth: Payer: Self-pay | Admitting: Cardiology

## 2011-02-01 NOTE — Telephone Encounter (Signed)
Her psychiatrist wants to prescribe a sleeping pill for her and wants to know if there are any that she cannot take due to med reaction with other meds.

## 2011-02-01 NOTE — Telephone Encounter (Signed)
No special concerns from my viewpoint. If her Doctor has a particular med he wants to ask [place] about, please let us know.

## 2011-02-01 NOTE — Telephone Encounter (Signed)
Needs to speak to you regarding her starting a sleeping pill another doctor wants to put her on.  Is there one that he would recommend that would not interfere with her other meds.  Please call her [phone].

## 2011-02-02 NOTE — Telephone Encounter (Signed)
Pt was notified.  

## 2011-02-17 ENCOUNTER — Encounter: Payer: Self-pay | Admitting: Internal Medicine

## 2011-02-27 ENCOUNTER — Other Ambulatory Visit: Payer: Self-pay | Admitting: Internal Medicine

## 2011-04-12 ENCOUNTER — Telehealth: Payer: Self-pay | Admitting: Cardiology

## 2011-04-12 NOTE — Telephone Encounter (Signed)
Complaining of dizziness off and on today, sob x 2 days, tired past month.  She also states she has been having trouble concentrating and increased thirst recently.  BP today was 80/47, 88/51 and 101/46.

## 2011-04-12 NOTE — Telephone Encounter (Signed)
New Msg: Pt calling wanting to speak with nurse c/o low blood pressure and feelings of dizziness. Please return pt call to discuss further.

## 2011-04-12 NOTE — Telephone Encounter (Signed)
Pt to hold Lasix and Potassium tomorrow.  To restart Lasix and Potassium on Friday as long as she is doing ok.  Pt to f/u with Dr Ron Parker next week.  Instructions per Dr Acie Fredrickson.

## 2011-04-17 ENCOUNTER — Encounter: Payer: Self-pay | Admitting: Cardiology

## 2011-04-17 DIAGNOSIS — R079 Chest pain, unspecified: Secondary | ICD-10-CM | POA: Insufficient documentation

## 2011-04-18 ENCOUNTER — Encounter: Payer: Self-pay | Admitting: Cardiology

## 2011-04-18 ENCOUNTER — Ambulatory Visit (INDEPENDENT_AMBULATORY_CARE_PROVIDER_SITE_OTHER): Payer: PRIVATE HEALTH INSURANCE | Admitting: Cardiology

## 2011-04-18 DIAGNOSIS — E8779 Other fluid overload: Secondary | ICD-10-CM

## 2011-04-18 DIAGNOSIS — I428 Other cardiomyopathies: Secondary | ICD-10-CM

## 2011-04-18 DIAGNOSIS — R079 Chest pain, unspecified: Secondary | ICD-10-CM

## 2011-04-18 DIAGNOSIS — I959 Hypotension, unspecified: Secondary | ICD-10-CM

## 2011-04-18 MED ORDER — FUROSEMIDE 40 MG PO TABS: 40.0000 mg | ORAL_TABLET | Freq: Every day | ORAL | Status: DC

## 2011-04-18 NOTE — Assessment & Plan Note (Signed)
Her fluid status is stable.  He remained stable on the lower dose of Lasix.  This will be continued.

## 2011-04-18 NOTE — Progress Notes (Signed)
HPI  Patient is seen in followup cardiomyopathy.  She is also seen followup hypotension.  I had seen her last in March, 2011.  Last week the patient felt weak and tired.  Her blood pressure was 80/47.  Her Lasix was held for several days and she felt better.  She did not have syncope.  She has not had chest pain or shortness of breath.  She has felt a sensation of coolness in her feet.  This is at rest.  There is no exertional claudication type symptoms.  Historically the patient has had left ventricular dysfunction.  Her EF has been buried over time.  In October, 2000, her ejection fraction was as low as 20%.  This improved over time up to 50% in September, 2007.  In 2009 EF seemed to decrease to 35-40%.  EF in March, 2011 was 40-50%.  As part of today's evaluation I have reviewed my old records about the patient extensively and I have updated the current new electronic record.  She also mentions some excess thirst.  The patient is watched carefully for the possibility of diabetes.  This has Not been a significant documented problem.  Allergies  Allergen Reactions  . Sulfonamide Derivatives     REACTION: sweeling    Current Outpatient Prescriptions  Medication Sig Dispense Refill  . Calcium Carbonate-Vit D-Min 600-200 MG-UNIT TABS Take 1 tablet by mouth daily.        . carvedilol (COREG) 25 MG tablet Take 2 tablets (50 mg total) by mouth 2 (two) times daily.  60 tablet  11  . cetirizine (ZYRTEC) 10 MG tablet Take 10 mg by mouth daily.        . digoxin (LANOXIN) 0.125 MG tablet Take 125 mcg by mouth daily.        Marland Kitchen FLUoxetine (PROZAC) 40 MG capsule Take 80 mg by mouth daily.        . furosemide (LASIX) 80 MG tablet TAKE 1 TABLET BY MOUTH TWICE DAILY  60 tablet  7  . glucosamine-chondroitin 500-400 MG tablet Take 1 tablet by mouth 2 (two) times daily.        Marland Kitchen lamoTRIgine (LAMICTAL) 200 MG tablet Take 400 mg by mouth daily.       Marland Kitchen lisinopril (PRINIVIL,ZESTRIL) 20 MG tablet TAKE 1 TABLET BY  MOUTH EVERY DAY  90 tablet  5  . Multiple Vitamin (MULTI-VITAMIN PO) Take by mouth daily.        . potassium chloride SA (K-DUR,KLOR-CON) 20 MEQ tablet TAKE ONE TABLET BY MOUTH EVERY DAY  90 tablet  5  . simvastatin (ZOCOR) 20 MG tablet TAKE 1 TABLET BY MOUTH EVERY NIGHT AT BEDTIME  30 tablet  5  . spironolactone (ALDACTONE) 25 MG tablet 1 tab po qd  90 tablet  3  . vitamin C (ASCORBIC ACID) 500 MG tablet Take 500 mg by mouth daily.          History   Social History  . Marital Status: Married    Spouse Name: N/A    Number of Children: N/A  . Years of Education: N/A   Occupational History  . Not on file.   Social History Main Topics  . Smoking status: Never Smoker   . Smokeless tobacco: Not on file  . Alcohol Use: Yes     Rare  . Drug Use:   . Sexually Active:    Other Topics Concern  . Not on file   Social History Narrative   Pt lives  with his spouse, daughter and college friend.     Family History  Problem Relation Age of Onset  . Diabetes Mother   . Cancer Mother     Ovarian, benign mass  . Diabetes Father   . Hypertension Other     Parent  . Hyperlipidemia Other     parent  . Arthritis Other     parent, grandparent    Past Medical History  Diagnosis Date  . BUNIONS, BILATERAL   . CARDIOMYOPATHY 02/1999    EF 20% in 02/1999, improved over time. Echo 01/2006 - EF 50-55% EF 04/2008 35-40% EF 40-45% Echo 07/22/2009  . MIGRAINE HEADACHE   . DEPRESSION   . OBESITY   . 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 DYSLIPIDEMIA   . DIABETES MELLITUS, TYPE II, CONTROLLED, MILD   . BENIGN NEOPLASM OF SKIN SITE UNSPECIFIED   . Arthritis     Hands, Knees RT>LT  . Chest pain     Nuclear, 2007, normal ( catheterization in 2000, normal coronary arteries)  . Fluid overload   . Sinus tachycardia   . Hypotension     November, 2012    Past Surgical History  Procedure Date  . Cardiac catheterization   . Heart biopsy   . Vein scope      ROS   Patient denies fever, chills, headache, sweats, rash, change in vision, change in hearing, chest pain, cough, nausea vomiting, urinary symptoms.  All other systems are reviewed and are negative.  PHYSICAL EXAM  Patient is stable today.  She is overweight.  There is no jugular venous distention.  Lungs are clear.  Respiratory effort is unlabored.  Cardiac exam reveals S1-S2.  There no clicks or significant murmurs.  The abdomen is soft.  There is no peripheral edema.  There are no musculoskeletal deformities.  No skin rashes.  Filed Vitals:   04/18/11 1453  BP: 105/59  Pulse: 88  Height: 5' 6"  (1.676 m)  Weight: 241 lb 12.8 oz (109.68 kg)    EKG  EKG is done today and reviewed by me.  I have compared to the tracing of 2010.  She has sinus rhythm.  She has diffuse nonspecific ST-T wave changes.  There is no change  ASSESSMENT & PLAN

## 2011-04-18 NOTE — Assessment & Plan Note (Addendum)
The patient felt poorly last week with decreased blood pressure.  She is feeling better today.  Her overall diuretic dose has been decreased.  Since she has had significant LV dysfunction in the past and since she is now stable on lower diuretic dose, I chose not to change her other medicines. Probably from the current dose of her medicines.  He do need to check chemistry.  Also because she felt poorly a CBC will be checked.  Historically her thyroid functions have been normal over time.

## 2011-04-18 NOTE — Assessment & Plan Note (Signed)
She has not had any recurrent significant chest pain.  No further workup.

## 2011-04-18 NOTE — Patient Instructions (Signed)
Your physician recommends that you schedule a follow-up appointment in: 2-3 [place] Your physician recommends that you continue on your current medications as directed. Please refer to the Current Medication list given to you today. Your physician has requested that you have an echocardiogram. Echocardiography is a painless test that uses sound waves to create images of your heart. It provides your doctor with information about the size and shape of your heart and how well your heart's chambers and valves are working. This procedure takes approximately one hour. There are no restrictions for this procedure. DX 458.9. Your physician recommends that you return for lab work in: [place] 458.9

## 2011-04-18 NOTE — Assessment & Plan Note (Signed)
The patient is on good doses of medicine for her cardiomyopathy.  Her last echo was in March, 2011.  Because she has had such variation over time I feel that we should repeat an echo now.

## 2011-04-19 LAB — BASIC METABOLIC PANEL
Chloride: 105 mEq/L (ref 96–112)
Potassium: 4.5 mEq/L (ref 3.5–5.1)
Sodium: 138 mEq/L (ref 135–145)

## 2011-04-19 LAB — CBC WITH DIFFERENTIAL/PLATELET
Basophils Absolute: 0 10*3/uL (ref 0.0–0.1)
Eosinophils Absolute: 0.2 10*3/uL (ref 0.0–0.7)
Lymphocytes Relative: 26.5 % (ref 12.0–46.0)
MCHC: 33.9 g/dL (ref 30.0–36.0)
Neutrophils Relative %: 66.5 % (ref 43.0–77.0)
Platelets: 296 10*3/uL (ref 150.0–400.0)
RBC: 4.24 Mil/uL (ref 3.87–5.11)
RDW: 14.8 % — ABNORMAL HIGH (ref 11.5–14.6)

## 2011-04-23 ENCOUNTER — Other Ambulatory Visit: Payer: Self-pay | Admitting: Cardiology

## 2011-04-24 NOTE — Progress Notes (Signed)
Summary: POSSIBLE SINUS INFECTION AND CHEST CONGESTION NH Room 5   Vital Signs:  Patient Profile:   40 Years Old Female CC:      Sinus Headache,Productive cough, Congestion x 6 days Height:     64 inches (162.56 cm) Weight:      252 pounds O2 Sat:      98 % O2 treatment:    Room Air Temp:     99.0 degrees F oral Pulse rate:   78 / minute Pulse rhythm:   regular Resp:     12 per minute BP sitting:   98 / 63  (left arm) Cuff size:   large  Vitals Entered By: Georgiann Mccoy (Sep 29, 2010 8:16 AM)                  Current Allergies (reviewed today): ! SULFAHistory of Present Illness Chief Complaint: Sinus Headache,Productive cough, Congestion x 6 days History of Present Illness:  Subjective: Patient complains of onset of URI symptoms 5 days ago with frontal headache and left earache.  This was soon followed by sinus congestion and mild sore throat.  The sore throat has now resolved. + cough started yesterday, non-productive No pleuritic pain No wheezing + post-nasal drainage ? sinus pain/pressure No itchy/red eyes + earache initially, resolved No hemoptysis No SOB No fever/chills, but has felt hot No nausea No vomiting No abdominal pain No diarrhea No skin rashes + fatigue No myalgias Used OTC meds without relief; she presently take Zyrtec for seasonal rhinitis.  Her symptoms have not improved with guaifenesin  REVIEW OF SYSTEMS Constitutional Symptoms      Denies fever, chills, night sweats, weight loss, weight gain, and fatigue.  Eyes       Complains of eye drainage.      Denies change in vision, eye pain, glasses, contact lenses, and eye surgery. Ear/Nose/Throat/Mouth       Complains of ear pain, frequent runny nose, sinus problems, and hoarseness.      Denies hearing loss/aids, change in hearing, ear discharge, dizziness, frequent nose bleeds, sore throat, and tooth pain or bleeding.  Respiratory       Complains of productive cough.      Denies dry cough,  wheezing, shortness of breath, asthma, bronchitis, and emphysema/COPD.  Cardiovascular       Denies murmurs, chest pain, and tires easily with exhertion.    Gastrointestinal       Denies stomach pain, nausea/vomiting, diarrhea, constipation, blood in bowel movements, and indigestion. Genitourniary       Denies painful urination, kidney stones, and loss of urinary control. Neurological       Complains of headaches.      Denies paralysis, seizures, and fainting/blackouts. Musculoskeletal       Denies muscle pain, joint pain, joint stiffness, decreased range of motion, redness, swelling, muscle weakness, and gout.  Skin       Denies bruising, unusual mles/lumps or sores, and hair/skin or nail changes.  Psych       Denies mood changes, temper/anger issues, anxiety/stress, speech problems, depression, and sleep problems.  Past History:  Past Medical History: Reviewed history from 08/02/2010 and no changes required. Migraines Cardiomyopathy. EF 20% in October 2000,  improved over time.echocardiogram    September 2007-EF 50-55%---EF 04/2008 .Marland Kitchen35-40%  /  EF 40-45%...echo...07/22/2009 Chest pain 2007 with normal Cardiolite. History of normal coronary arteries in 2000 Depression  arthritis -hands, knees r>l metabolic syndrome - hypertriglycerides 04/2008, hyperglycemia DM2  Md roster: cards -  Ron Parker gyn - [facility] -[facility]us - [facility]  Past Surgical History: Heart cath Heart Biopsy Vein Scope  Family History: Reviewed history from 10/07/2009 and no changes required. Family History of Arthritis (parent, grandparent) Family History Diabetes 1st degree relative (dad, mom) Family History High cholesterol (parent) Family History Hypertension (parent) Family History Ovarian cancer (mom, benign mass)  mom expired age 101 - [place], DM - sepsis from lymphedema? dad expired age 15 - [place], smoker, DM, bipolar schizophrenia  Social History: Reviewed history from 10/07/2009 and no changes  required. Never Smoked married, lives with spouse, dtr and college friend rare alcohol use disabled   Objective:  Appearance:  Patient appears healthy, stated age, and in no acute distress  Eyes:  Pupils are equal, round, and reactive to light and accomdation.  Extraocular movement is intact.  Conjunctivae are not inflamed.  Ears:  Canals normal.  Tympanic membranes normal.   Nose:  Mildly congested turbinates.  + mild maxillary sinus tenderness  Pharynx:  Normal  Neck:  Supple.  Tender shotty posterior nodes are palpated bilaterally.  Lungs:  Clear to auscultation.  Breath sounds are equal.  Heart:  Regular rate and rhythm without murmurs, rubs, or gallops.  Abdomen:  Nontender without masses or hepatosplenomegaly.  Bowel sounds are present.  No CVA or flank tenderness.  Extremities:  No edema.   Skin:  No rash CBC:  WBC 10.3 with normal diff; Hgb 12.6 Assessment New Problems: UPPER RESPIRATORY INFECTION, ACUTE (ICD-465.9)  NO EVIDENCE BACTERIAL INFECTION TODAY  Plan New Medications/Changes: BENZONATATE 200 MG CAPS (BENZONATATE) One by mouth hs as needed cough  #12 x 0, 09/29/2010, Theone Murdoch MD AMOXICILLIN 875 MG TABS (AMOXICILLIN) One by mouth two times a day (Rx void after 10/07/10)  #20 x 0, 09/29/2010, Theone Murdoch MD  New Orders: Pulse Oximetry (single measurment) [94760] CBC w/Diff [81017-51025] New Patient Level III [85277] Planning Comments:   Treat symptomatically for now:  Increase fluid intake, begin expectorant/decongestant, topical decongestant,  cough suppressant at bedtime.  If fever/chills/sweats persist, or if not improving 5  days begin amoxicillin (given Rx to hold).  Followup with PCP if not improving 7 to 10 days.   The patient and/or caregiver has been counseled thoroughly with regard to medications prescribed including dosage, schedule, interactions, rationale for use, and possible side effects and they verbalize understanding.  Diagnoses and  expected course of recovery discussed and will return if not improved as expected or if the condition worsens. Patient and/or caregiver verbalized understanding.  Prescriptions: BENZONATATE 200 MG CAPS (BENZONATATE) One by mouth hs as needed cough  #12 x 0   Entered and Authorized by:   Theone Murdoch MD   Signed by:   Theone Murdoch MD on 09/29/2010   Method used:   Print then Give to Patient   RxID:   [id number] AMOXICILLIN 875 MG TABS (AMOXICILLIN) One by mouth two times a day (Rx void after 10/07/10)  #20 x 0   Entered and Authorized by:   Theone Murdoch MD   Signed by:   Theone Murdoch MD on 09/29/2010   Method used:   Print then Give to Patient   RxID:   [id number]   Patient Instructions: 1)  Take Mucinex D (guaifenesin with decongestant) twice daily for congestion, or take Mucinex plus Sudafed. 2)  Stop Zyrtec for now. 3)  Increase fluid intake, rest. 4)  May use Afrin nasal spray (or generic oxymetazoline) twice daily for about 5 days.  Also recommend using  saline nasal spray several times daily and/or saline nasal irrigation. 5)  Begin Amoxicillin if not improving about 5 days or if persistent fever develops. 6)  Followup with family doctor if not improving 7 to 10 days.   Orders Added: 1)  Pulse Oximetry (single measurment) [94760] 2)  CBC w/Diff [[id number]] 3)  New Patient Level III [83462]

## 2011-05-04 ENCOUNTER — Ambulatory Visit (HOSPITAL_COMMUNITY): Payer: PRIVATE HEALTH INSURANCE | Attending: Cardiovascular Disease

## 2011-05-04 DIAGNOSIS — E785 Hyperlipidemia, unspecified: Secondary | ICD-10-CM | POA: Insufficient documentation

## 2011-05-04 DIAGNOSIS — I079 Rheumatic tricuspid valve disease, unspecified: Secondary | ICD-10-CM | POA: Insufficient documentation

## 2011-05-04 DIAGNOSIS — E119 Type 2 diabetes mellitus without complications: Secondary | ICD-10-CM | POA: Insufficient documentation

## 2011-05-04 DIAGNOSIS — I959 Hypotension, unspecified: Secondary | ICD-10-CM

## 2011-05-04 DIAGNOSIS — I379 Nonrheumatic pulmonary valve disorder, unspecified: Secondary | ICD-10-CM | POA: Insufficient documentation

## 2011-05-04 DIAGNOSIS — I428 Other cardiomyopathies: Secondary | ICD-10-CM

## 2011-05-08 ENCOUNTER — Ambulatory Visit: Payer: PRIVATE HEALTH INSURANCE | Admitting: Cardiology

## 2011-05-09 ENCOUNTER — Ambulatory Visit (INDEPENDENT_AMBULATORY_CARE_PROVIDER_SITE_OTHER): Payer: PRIVATE HEALTH INSURANCE | Admitting: Cardiology

## 2011-05-09 ENCOUNTER — Encounter: Payer: Self-pay | Admitting: Cardiology

## 2011-05-09 DIAGNOSIS — I428 Other cardiomyopathies: Secondary | ICD-10-CM

## 2011-05-09 DIAGNOSIS — I959 Hypotension, unspecified: Secondary | ICD-10-CM

## 2011-05-09 DIAGNOSIS — E8779 Other fluid overload: Secondary | ICD-10-CM

## 2011-05-09 NOTE — Assessment & Plan Note (Signed)
Her current blood pressure is 98 systolic. This is stable for her. She's not having symptoms. We will not adjust her medicines any further. She is on very good medications for cardiomyopathy.

## 2011-05-09 NOTE — Assessment & Plan Note (Signed)
Volume status is stable. No change in therapy. 

## 2011-05-09 NOTE — Patient Instructions (Signed)
Your physician wants you to follow-up in:  6 months. You will receive a reminder letter in the mail two months in advance. If you don't receive a letter, please call our office to schedule the follow-up appointment.   

## 2011-05-09 NOTE — Progress Notes (Signed)
HPI   Patient is seen today for followup visit oh April 18, 2011. She has history of a cardiomyopathy. She had some hypotension and felt poorly. Her Lasix was held. When I saw her last her CBC was checked revealing a hemoglobin of 13. Renal function was good with a BUN of 19 and creatinine 0.8. Two-dimensional echo revealed that her ejection fraction remains the same at 40-45%. She's feeling well.  Allergies  Allergen Reactions  . Sulfonamide Derivatives     REACTION: sweeling    Current Outpatient Prescriptions  Medication Sig Dispense Refill  . Calcium Carbonate-Vit D-Min 600-200 MG-UNIT TABS Take 1 tablet by mouth daily.        . carvedilol (COREG) 25 MG tablet Take 2 tablets (50 mg total) by mouth 2 (two) times daily.  60 tablet  11  . cetirizine (ZYRTEC) 10 MG tablet Take 10 mg by mouth daily.        . digoxin (LANOXIN) 0.125 MG tablet TAKE 1 TABLET BY MOUTH ONCE DAILY  90 tablet  3  . FLUoxetine (PROZAC) 40 MG capsule Take 80 mg by mouth daily.        . furosemide (LASIX) 40 MG tablet Take 1 tablet (40 mg total) by mouth daily.  60 tablet  7  . glucosamine-chondroitin 500-400 MG tablet Take 1 tablet by mouth 2 (two) times daily.        Marland Kitchen lamoTRIgine (LAMICTAL) 200 MG tablet Take 400 mg by mouth daily.       Marland Kitchen lisinopril (PRINIVIL,ZESTRIL) 20 MG tablet TAKE 1 TABLET BY MOUTH EVERY DAY  90 tablet  5  . Multiple Vitamin (MULTI-VITAMIN PO) Take by mouth daily.        . potassium chloride SA (K-DUR,KLOR-CON) 20 MEQ tablet TAKE ONE TABLET BY MOUTH EVERY DAY  90 tablet  5  . simvastatin (ZOCOR) 20 MG tablet TAKE 1 TABLET BY MOUTH EVERY NIGHT AT BEDTIME  30 tablet  5  . spironolactone (ALDACTONE) 25 MG tablet 1 tab po qd  90 tablet  3  . vitamin C (ASCORBIC ACID) 500 MG tablet Take 500 mg by mouth daily.          History   Social History  . Marital Status: Married    Spouse Name: N/A    Number of Children: N/A  . Years of Education: N/A   Occupational History  . Not on file.    Social History Main Topics  . Smoking status: Never Smoker   . Smokeless tobacco: Not on file  . Alcohol Use: Yes     Rare  . Drug Use:   . Sexually Active:    Other Topics Concern  . Not on file   Social History Narrative   Pt lives with his spouse, daughter and college friend.     Family History  Problem Relation Age of Onset  . Diabetes Mother   . Cancer Mother     Ovarian, benign mass  . Diabetes Father   . Hypertension Other     Parent  . Hyperlipidemia Other     parent  . Arthritis Other     parent, grandparent    Past Medical History  Diagnosis Date  . BUNIONS, BILATERAL   . CARDIOMYOPATHY 02/1999    EF 20% in 02/1999, improved over time. Echo 01/2006 - EF 50-55% EF 04/2008 35-40% EF 40-45% Echo 07/22/2009  . MIGRAINE HEADACHE   . DEPRESSION   . OBESITY   . METABOLIC SYNDROME X  hypertriglycerides 04/2008, hyperglycemia  . DYSLIPIDEMIA   . DIABETES MELLITUS, TYPE II, CONTROLLED, MILD   . BENIGN NEOPLASM OF SKIN SITE UNSPECIFIED   . Arthritis     Hands, Knees RT>LT  . Chest pain     Nuclear, 2007, normal ( catheterization in 2000, normal coronary arteries)  . Fluid overload   . Sinus tachycardia   . Hypotension     November, 2012    Past Surgical History  Procedure Date  . Cardiac catheterization   . Heart biopsy   . Vein scope     ROS   Patient denies fever, chills, headache, sweats, rash, change in vision, change in hearing, chest pain, cough, nausea vomiting, urinary symptoms. All other systems are reviewed and are negative.  PHYSICAL EXAM  Patient is stable. She is oriented to person time and place. Affect is normal. There is no jugulovenous distention. Lungs are clear. Respiratory effort is nonlabored. Cardiac exam reveals S1 and S2. There are no clicks or significant murmurs. The abdomen is soft. There is no peripheral edema.  Filed Vitals:   05/09/11 0917  BP: 98/65  Pulse: 89  Height: 5' 6"  (1.676 m)  Weight: 241 lb 12.8 oz  (109.68 kg)    EKG  ASSESSMENT & PLAN

## 2011-05-09 NOTE — Assessment & Plan Note (Signed)
Followup ejection fraction is 40-45% by echo in December, 2012. This is stable for her and we're both pleased. No change in therapy.

## 2011-05-21 ENCOUNTER — Other Ambulatory Visit: Payer: Self-pay | Admitting: Internal Medicine

## 2011-05-22 ENCOUNTER — Telehealth: Payer: Self-pay | Admitting: Cardiology

## 2011-05-22 NOTE — Telephone Encounter (Signed)
Per Dr Rayann Heman, pt was told to hold lasix for a couple days and see a PA or NP at the end of the week.  Appt was scheduled with Truitt Merle NP on Friday.  Pt was notified and agrees.

## 2011-05-22 NOTE — Telephone Encounter (Signed)
Reporting a low BP and sob this am.  It was 88/46 after going up and down stairs this am.  No dizziness and no dry mouth.  This happened previously at the end of Nov.  At that time she held her lasix and potassium for a few days with relief of symptoms.  BP normally runs around 94/56.

## 2011-05-22 NOTE — Telephone Encounter (Addendum)
New msg Pt wanted to talk to someone about her BP 88/47 please call her back

## 2011-05-25 ENCOUNTER — Telehealth: Payer: Self-pay | Admitting: Cardiology

## 2011-05-25 NOTE — Telephone Encounter (Signed)
Pt states she is holding her lasix but BP went down one more time since 05/22/11.  She will keep her appt here tomorrow.

## 2011-05-25 NOTE — Telephone Encounter (Signed)
Pt rtn call 

## 2011-05-26 ENCOUNTER — Ambulatory Visit (INDEPENDENT_AMBULATORY_CARE_PROVIDER_SITE_OTHER): Payer: PRIVATE HEALTH INSURANCE | Admitting: Nurse Practitioner

## 2011-05-26 ENCOUNTER — Encounter: Payer: Self-pay | Admitting: Nurse Practitioner

## 2011-05-26 VITALS — BP 94/62 | HR 90 | Ht 66.0 in | Wt 243.0 lb

## 2011-05-26 DIAGNOSIS — I428 Other cardiomyopathies: Secondary | ICD-10-CM

## 2011-05-26 DIAGNOSIS — I5020 Unspecified systolic (congestive) heart failure: Secondary | ICD-10-CM

## 2011-05-26 LAB — BASIC METABOLIC PANEL
BUN: 19 mg/dL (ref 6–23)
CO2: 23 mEq/L (ref 19–32)
Calcium: 8.9 mg/dL (ref 8.4–10.5)
Chloride: 106 mEq/L (ref 96–112)
Creatinine, Ser: 0.7 mg/dL (ref 0.4–1.2)
GFR: 92.51 mL/min (ref >60.00)
Glucose, Bld: 140 mg/dL — ABNORMAL HIGH (ref 70–99)
Potassium: 4.6 mEq/L (ref 3.5–5.1)
Sodium: 137 mEq/L (ref 135–145)

## 2011-05-26 LAB — BRAIN NATRIURETIC PEPTIDE: Pro B Natriuretic peptide (BNP): 8 pg/mL (ref 0.0–100.0)

## 2011-05-26 NOTE — Progress Notes (Signed)
Carolyn Salazar Date of Birth: 06-Aug-1970 Medical Record #[id number]  History of Present Illness: [name] is seen today for a work in visit. She is seen for Dr. Ron Parker. She is a 41 year old obese female with a history of a cardiomyopathy. EF has improved, up to 40-45%. Last echo was just this past December.   She comes in today. She had called to report a low blood pressure and some shortness of breath on Monday. She had been going up and down her steps to do laundry. She was not dizzy or lightheaded. She actually felt pretty good. However, she does not exercise on any basis and only goes up her steps inconsistently. She checked her blood pressure and it was noted to be 88/46. She is on a very good CHF regimen. She was told to hold her diuretic but restarted it last night due to feeling like her fluid was building back up. She tries to watch her salt. Doesn't exercise and probably has a degree of deconditioning. No chest pain.   Current Outpatient Prescriptions on File Prior to Visit  Medication Sig Dispense Refill  . Calcium Carbonate-Vit D-Min 600-200 MG-UNIT TABS Take 1 tablet by mouth daily.        . carvedilol (COREG) 25 MG tablet Take 2 tablets (50 mg total) by mouth 2 (two) times daily.  60 tablet  11  . cetirizine (ZYRTEC) 10 MG tablet Take 10 mg by mouth daily.        . digoxin (LANOXIN) 0.125 MG tablet TAKE 1 TABLET BY MOUTH ONCE DAILY  90 tablet  3  . FLUoxetine (PROZAC) 40 MG capsule Take 80 mg by mouth daily.        Marland Kitchen glucosamine-chondroitin 500-400 MG tablet Take 1 tablet by mouth daily.       Marland Kitchen lamoTRIgine (LAMICTAL) 200 MG tablet Take 400 mg by mouth daily.       Marland Kitchen lisinopril (PRINIVIL,ZESTRIL) 20 MG tablet TAKE 1 TABLET BY MOUTH EVERY DAY  90 tablet  5  . Multiple Vitamin (MULTI-VITAMIN PO) Take by mouth daily.        . potassium chloride SA (K-DUR,KLOR-CON) 20 MEQ tablet TAKE ONE TABLET BY MOUTH EVERY DAY  90 tablet  5  . simvastatin (ZOCOR) 20 MG tablet TAKE 1 TABLET BY  MOUTH EVERY NIGHT AT BEDTIME  30 tablet  5  . spironolactone (ALDACTONE) 25 MG tablet 1 tab po qd  90 tablet  3  . vitamin C (ASCORBIC ACID) 500 MG tablet Take 500 mg by mouth daily.        Marland Kitchen DISCONTD: furosemide (LASIX) 40 MG tablet Take 1 tablet (40 mg total) by mouth daily.  60 tablet  7  . DISCONTD: simvastatin (ZOCOR) 20 MG tablet TAKE 1 TABLET BY MOUTH AT BEDTIME  90 tablet  1    Allergies  Allergen Reactions  . Sulfonamide Derivatives     REACTION: sweeling    Past Medical History  Diagnosis Date  . BUNIONS, BILATERAL   . CARDIOMYOPATHY 02/1999    EF 20% in 02/1999, improved over time. Echo 01/2006 - EF 50-55% EF 04/2008 35-40% EF 40-45% Echo 07/22/2009  . MIGRAINE HEADACHE   . DEPRESSION   . OBESITY   . 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 DYSLIPIDEMIA   . DIABETES MELLITUS, TYPE II, CONTROLLED, MILD   . BENIGN NEOPLASM OF SKIN SITE UNSPECIFIED   . Arthritis     Hands, Knees RT>LT  .  Chest pain     Nuclear, 2007, normal ( catheterization in 2000, normal coronary arteries)  . Fluid overload   . Sinus tachycardia   . Hypotension     November, 2012    Past Surgical History  Procedure Date  . Cardiac catheterization   . Heart biopsy   . Vein scope     History  Smoking status  . Never Smoker   Smokeless tobacco  . Not on file    History  Alcohol Use  . Yes    Rare    Family History  Problem Relation Age of Onset  . Diabetes Mother   . Cancer Mother     Ovarian, benign mass  . Diabetes Father   . Hypertension Other     Parent  . Hyperlipidemia Other     parent  . Arthritis Other     parent, grandparent    Review of Systems: The review of systems is positive for recent URI.  All other systems were reviewed and are negative.  Physical Exam: BP 94/62  Pulse 90  Ht 5' 6"  (1.676 m)  Wt 243 lb (110.224 kg)  BMI 39.22 kg/m2 Patient is very pleasant and in no acute distress. She is morbidly obese. Skin is warm  and dry. Color is normal.  HEENT is unremarkable. Normocephalic/atraumatic. PERRL. Sclera are nonicteric. Neck is supple. No masses. No JVD. Lungs are clear. Cardiac exam shows a regular rate and rhythm. She has a soft S3. Abdomen is obese but soft. Extremities are without edema. Gait and ROM are intact. No gross neurologic deficits noted.  LABORATORY DATA: BMET and BNP are pending.   Assessment / Plan:

## 2011-05-26 NOTE — Assessment & Plan Note (Signed)
She looks compensated to me. I explained that with the CHF regimen she is on that her heart likes her blood pressure to be in this range, unless she was symptomatic. She is not symptomatic. I have left her on her current regimen. We will check a BNP and BMET today. For now, she will keep her regular appointment with Dr. Ron Parker for June. I have encouraged her to try and walk on a consistent basis. I think this would benefit her greatly.  Patient is agreeable to this plan and will call if any problems develop in the interim.

## 2011-05-26 NOTE — Patient Instructions (Signed)
Continue with your current medicines. Weigh yourself each morning and record. Take extra dose of diuretic for weight gain of 3 pounds in 24 hours.  Call the office if you have any concerns. Limit sodium intake. Goal is to have less than 2000 mg (2gm) of salt per day.  We will check your labs today.  Keep your 6 month appointment with Dr. Ron Parker unless problems arise.

## 2011-06-29 ENCOUNTER — Emergency Department (INDEPENDENT_AMBULATORY_CARE_PROVIDER_SITE_OTHER)
Admission: EM | Admit: 2011-06-29 | Discharge: 2011-06-29 | Disposition: A | Discharge status: Home / Self Care | Payer: PRIVATE HEALTH INSURANCE | Source: Home / Self Care | Attending: Family Medicine | Admitting: Family Medicine

## 2011-06-29 ENCOUNTER — Encounter: Payer: Self-pay | Admitting: *Deleted

## 2011-06-29 DIAGNOSIS — B029 Zoster without complications: Secondary | ICD-10-CM

## 2011-06-29 MED ORDER — VALACYCLOVIR HCL 1 G PO TABS: 1000.0000 mg | ORAL_TABLET | Freq: Three times a day (TID) | ORAL | Status: DC

## 2011-06-29 NOTE — ED Provider Notes (Signed)
History     CSN: [id number]  Arrival date & time 06/29/11  0820   First MD Initiated Contact with Patient 06/29/11 0915      Chief Complaint  Patient presents with  . Rash      HPI Comments: Patient complains of onset of rash on abdomen and upper chest two days ago.  She feels well otherwise.  She has had chickenpox in the past  Patient is a 41 y.o. female presenting with rash.  Rash  This is a new problem. The current episode started 2 days ago. The problem has been gradually worsening. The problem is associated with nothing. There has been no fever. The rash is present on the abdomen and torso. The pain is at a severity of 0/10. The patient is experiencing no pain. Pertinent negatives include no blisters, no itching, no pain and no weeping. She has tried nothing for the symptoms.    Past Medical History  Diagnosis Date  . BUNIONS, BILATERAL   . CARDIOMYOPATHY 02/1999    EF 20% in 02/1999, improved over time. Echo 01/2006 - EF 50-55% EF 04/2008 35-40% EF 40-45% Echo 07/22/2009  . MIGRAINE HEADACHE   . DEPRESSION   . OBESITY   . 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 DYSLIPIDEMIA   . DIABETES MELLITUS, TYPE II, CONTROLLED, MILD   . BENIGN NEOPLASM OF SKIN SITE UNSPECIFIED   . Arthritis     Hands, Knees RT>LT  . Chest pain     Nuclear, 2007, normal ( catheterization in 2000, normal coronary arteries)  . Fluid overload   . Sinus tachycardia   . Hypotension     November, 2012    Past Surgical History  Procedure Date  . Cardiac catheterization   . Heart biopsy   . Vein scope   . Bunionectomy     Family History  Problem Relation Age of Onset  . Diabetes Mother   . Cancer Mother     Ovarian, benign mass  . Diabetes Father   . Heart failure Father   . Hypertension Other     Parent  . Hyperlipidemia Other     parent  . Arthritis Other     parent, grandparent    History  Substance Use Topics  . Smoking status: Never Smoker   .  Smokeless tobacco: Not on file  . Alcohol Use: Yes     Rare    OB History    Grav Para Term Preterm Abortions TAB SAB Ect Mult Living                  Review of Systems  Skin: Positive for rash. Negative for itching.  All other systems reviewed and are negative.    Allergies  Sulfonamide derivatives  Home Medications   Current Outpatient Rx  Name Route Sig Dispense Refill  . CALCIUM CARBONATE-VIT D-MIN 600-200 MG-UNIT PO TABS Oral Take 1 tablet by mouth daily.      Marland Kitchen CARVEDILOL 25 MG PO TABS Oral Take 2 tablets (50 mg total) by mouth 2 (two) times daily. 60 tablet 11  . CETIRIZINE HCL 10 MG PO TABS Oral Take 10 mg by mouth daily.      Marland Kitchen DIGOXIN 0.125 MG PO TABS  TAKE 1 TABLET BY MOUTH ONCE DAILY 90 tablet 3  . FLUOXETINE HCL 40 MG PO CAPS Oral Take 80 mg by mouth daily.      . FUROSEMIDE 40 MG PO TABS Oral  Take 40 mg by mouth 2 (two) times daily.      Marland Kitchen GLUCOSAMINE-CHONDROITIN 500-400 MG PO TABS Oral Take 1 tablet by mouth daily.     Marland Kitchen LAMOTRIGINE 200 MG PO TABS Oral Take 400 mg by mouth daily.     Marland Kitchen LISINOPRIL 20 MG PO TABS  TAKE 1 TABLET BY MOUTH EVERY DAY 90 tablet 5    **Patient requests 90 day supply**  . MULTI-VITAMIN PO Oral Take by mouth daily.      Marland Kitchen POTASSIUM CHLORIDE CRYS ER 20 MEQ PO TBCR  TAKE ONE TABLET BY MOUTH EVERY DAY 90 tablet 5    **Patient requests 90 day supply**  . SIMVASTATIN 20 MG PO TABS  TAKE 1 TABLET BY MOUTH EVERY NIGHT AT BEDTIME 30 tablet 5  . SPIRONOLACTONE 25 MG PO TABS  1 tab po qd 90 tablet 3  . VALACYCLOVIR HCL 1 G PO TABS Oral Take 1 tablet (1,000 mg total) by mouth 3 (three) times daily. 21 tablet 0  . VITAMIN C 500 MG PO TABS Oral Take 500 mg by mouth daily.        BP 88/56  Pulse 80  Temp(Src) 98.3 F (36.8 C) (Oral)  Resp 18  Ht 5' 6"  (1.676 m)  Wt 242 lb 4 oz (109.884 kg)  BMI 39.10 kg/m2  SpO2 96%  LMP 06/23/2011  Physical Exam  Nursing note and vitals reviewed. Constitutional: She appears well-developed and  well-nourished. No distress.  Skin:          In marked location on chest there are crops of 1 to 67m dia erythematous maculo-papular herpetic appearing lesions.    ED Course  Procedures none      1. Herpes zoster       MDM  Begin Valtrex. Followup with dermatologist if not improving.        STheone Murdoch MD 06/29/11 [phone]

## 2011-06-29 NOTE — ED Notes (Signed)
Pt c/o rash on her trunk x 2 days. No OTC meds. Denies fever.

## 2011-07-06 ENCOUNTER — Encounter: Payer: Self-pay | Admitting: Internal Medicine

## 2011-07-06 ENCOUNTER — Ambulatory Visit (INDEPENDENT_AMBULATORY_CARE_PROVIDER_SITE_OTHER): Payer: PRIVATE HEALTH INSURANCE | Admitting: Internal Medicine

## 2011-07-06 ENCOUNTER — Other Ambulatory Visit (INDEPENDENT_AMBULATORY_CARE_PROVIDER_SITE_OTHER): Payer: PRIVATE HEALTH INSURANCE

## 2011-07-06 DIAGNOSIS — R5383 Other fatigue: Secondary | ICD-10-CM

## 2011-07-06 DIAGNOSIS — IMO0002 Reserved for concepts with insufficient information to code with codable children: Secondary | ICD-10-CM

## 2011-07-06 DIAGNOSIS — E119 Type 2 diabetes mellitus without complications: Secondary | ICD-10-CM

## 2011-07-06 DIAGNOSIS — M792 Neuralgia and neuritis, unspecified: Secondary | ICD-10-CM

## 2011-07-06 DIAGNOSIS — R5381 Other malaise: Secondary | ICD-10-CM

## 2011-07-06 LAB — CBC WITH DIFFERENTIAL/PLATELET
Basophils Absolute: 0 10*3/uL (ref 0.0–0.1)
Eosinophils Absolute: 0.3 10*3/uL (ref 0.0–0.7)
Eosinophils Relative: 3 % (ref 0.0–5.0)
HCT: 33.2 % — ABNORMAL LOW (ref 36.0–46.0)
Lymphs Abs: 3.6 10*3/uL (ref 0.7–4.0)
MCHC: 34.2 g/dL (ref 30.0–36.0)
MCV: 92.9 fl (ref 78.0–100.0)
Monocytes Absolute: 0.6 10*3/uL (ref 0.1–1.0)
Platelets: 309 10*3/uL (ref 150.0–400.0)
RDW: 13.3 % (ref 11.5–14.6)

## 2011-07-06 LAB — TSH: TSH: 4.91 u[IU]/mL (ref 0.35–5.50)

## 2011-07-06 LAB — FERRITIN: Ferritin: 200.4 ng/mL (ref 10.0–291.0)

## 2011-07-06 LAB — HEPATIC FUNCTION PANEL
AST: 15 U/L (ref 0–37)
Albumin: 4 g/dL (ref 3.5–5.2)

## 2011-07-06 MED ORDER — GABAPENTIN 300 MG PO CAPS: 300.0000 mg | ORAL_CAPSULE | Freq: Three times a day (TID) | ORAL | Status: DC

## 2011-07-06 NOTE — Progress Notes (Signed)
Subjective:    Patient ID: Carolyn Salazar, female    DOB: 09-23-1970, 41 y.o.   MRN: [id number]  HPI here for rash - located on abdomen never epigastric region and slight made over breast left greater than right. Associated with itch and burning sensation. Denies outdoor exposure or new medication/topical. No fever, not spreading. No history of same. Evaluated in the emergency room and diagnosed with shingles, unimproved after 6 days of Valtrex therapy  also reviewed chronic medical issues  dyslipidemia - hx high TG 04/2008 - now faithful with low fat diet - started on statin 09/2009 for same - reports compliance with ongoing medical treatment and no changes in medication dose or frequency.  denies adverse side effects related to current therapy.   arthritis - pain is worse with cold weather and activity - pain affects bilateral MCP and bilateral knees R>L - no trauma or injury recalled, not assoc with any swelling- +FH both RA(mom) and OA (g-mom) - pain improved with "gloves" while knitting to keep hands warm and occ tylenol use   DM2, diet controlled - metabolic syndrome - +FH DM - ongoing weight loss efforts (intermittent), ongoing exercise - no PU or PD - does not check home cbg   CM hx 02/1999 - follows with cards for same - no edema or SOB - no recent need for med changes   depression - follows with psyc provider for same - reports compliance with ongoing medical treatment and no changes in medication dose or frequency. denies adverse side effects related to current therapy. no si or sadness   Past Medical History  Diagnosis Date  . BUNIONS, BILATERAL   . CARDIOMYOPATHY 02/1999    EF 20% in 02/1999, improved over time. Echo 01/2006 - EF 50-55% EF 04/2008 35-40% EF 40-45% Echo 07/22/2009  . MIGRAINE HEADACHE   . DEPRESSION   . OBESITY   . 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 DYSLIPIDEMIA   . DIABETES MELLITUS, TYPE II, CONTROLLED, MILD   . BENIGN  NEOPLASM OF SKIN SITE UNSPECIFIED   . Arthritis     Hands, Knees RT>LT  . Chest pain     Nuclear, 2007, normal ( catheterization in 2000, normal coronary arteries)  . Fluid overload   . Sinus tachycardia   . Hypotension     November, 2012    Review of Systems  Constitutional: Negative for unexpected weight change.  Respiratory: Negative for cough and shortness of breath.   Cardiovascular: Negative for chest pain and palpitations.       Objective:   Physical Exam  BP 80/62  Pulse 84  Temp(Src) 97.3 F (36.3 C) (Oral)  SpO2 97%  LMP 06/23/2011 Wt Readings from Last 3 Encounters:  06/29/11 242 lb 4 oz (109.884 kg)  05/26/11 243 lb (110.224 kg)  05/09/11 241 lb 12.8 oz (109.68 kg)   Constitutional: She is overweight. She appears well-developed and well-nourished. No distress.   Neck: Normal range of motion. Neck supple. No JVD present. No thyromegaly present.  Cardiovascular: Normal rate, regular rhythm and normal heart sounds.  No murmur heard. No BLE edema. Pulmonary/Chest: Effort normal and breath sounds normal. No respiratory distress. She has no wheezes.  MSkel: Back: full range of motion of thoracic and lumbar spine. Non tender to palpation. Negative straight leg raise. DTR's are symmetrically intact. Sensation intact in all dermatomes of the lower extremities. Full strength to manual muscle testing. patient is able to heel toe walk without  difficulty and ambulates with antalgic gait. Skin: small scattered petechial patch outbreak midline abdomen,  Also spot on L>R breast; no vesicles or cellulitis, Skin is warm and dry. No rash noted. No erythema.  Psychiatric: She has a normal mood and affect. Her behavior is normal. Judgment and thought content normal.   Lab Results  Component Value Date   WBC 13.8* 04/18/2011   HGB 12.8 04/18/2011   HCT 37.8 04/18/2011   PLT 296.0 04/18/2011   CHOL 153 01/31/2011   TRIG 257.0* 01/31/2011   HDL 41.00 01/31/2011   LDLDIRECT 87.1  01/31/2011   ALT 21 01/31/2011   AST 23 01/31/2011   NA 137 05/26/2011   K 4.6 05/26/2011   CL 106 05/26/2011   CREATININE 0.7 05/26/2011   BUN 19 05/26/2011   CO2 23 05/26/2011   TSH 3.17 01/10/2010   HGBA1C 6.0 01/31/2011       Assessment & Plan:   Fatigue - peticheal rash with neuropathy (BLE) but no weakness - check labs and start gabapentin  Also see problem list. Medications and labs reviewed today.

## 2011-07-06 NOTE — Patient Instructions (Signed)
It was good to see you today. Test(s) ordered today. Your results will be called to you after review (48-72hours after test completion). If any changes need to be made, you will be notified at that time. If labs ok, will start gabapentin for your nerve pain symptoms - Your prescription(s) have been submitted to your pharmacy. Please take as directed and contact our office if you believe you are having problem(s) with the medication(s).

## 2011-07-06 NOTE — Assessment & Plan Note (Signed)
Diet controlled - on ACEI Check a1c and adjust as needed Lab Results  Component Value Date   HGBA1C 6.0 01/31/2011

## 2011-07-07 ENCOUNTER — Telehealth: Payer: Self-pay | Admitting: *Deleted

## 2011-07-07 NOTE — Telephone Encounter (Signed)
Pt was given Gabapentin yesterday at [facility] for nerve pain from shingles-pt now states that she is having trouble walking and moving since starting the Gabapentin. VAL pt-please advise.

## 2011-07-08 ENCOUNTER — Other Ambulatory Visit: Payer: Self-pay

## 2011-07-08 ENCOUNTER — Inpatient Hospital Stay (HOSPITAL_COMMUNITY)
Admission: EM | Admit: 2011-07-08 | Discharge: 2011-07-10 | DRG: 683 | Disposition: A | Discharge status: Home / Self Care | Payer: PRIVATE HEALTH INSURANCE | Attending: Internal Medicine | Admitting: Internal Medicine

## 2011-07-08 ENCOUNTER — Emergency Department (HOSPITAL_COMMUNITY): Payer: PRIVATE HEALTH INSURANCE

## 2011-07-08 ENCOUNTER — Encounter (HOSPITAL_COMMUNITY): Payer: Self-pay | Admitting: Emergency Medicine

## 2011-07-08 DIAGNOSIS — B029 Zoster without complications: Secondary | ICD-10-CM | POA: Diagnosis present

## 2011-07-08 DIAGNOSIS — E8881 Metabolic syndrome: Secondary | ICD-10-CM

## 2011-07-08 DIAGNOSIS — Z833 Family history of diabetes mellitus: Secondary | ICD-10-CM

## 2011-07-08 DIAGNOSIS — E669 Obesity, unspecified: Secondary | ICD-10-CM

## 2011-07-08 DIAGNOSIS — W19XXXA Unspecified fall, initial encounter: Secondary | ICD-10-CM | POA: Diagnosis present

## 2011-07-08 DIAGNOSIS — F329 Major depressive disorder, single episode, unspecified: Secondary | ICD-10-CM

## 2011-07-08 DIAGNOSIS — M21619 Bunion of unspecified foot: Secondary | ICD-10-CM

## 2011-07-08 DIAGNOSIS — D72829 Elevated white blood cell count, unspecified: Secondary | ICD-10-CM

## 2011-07-08 DIAGNOSIS — N179 Acute kidney failure, unspecified: Principal | ICD-10-CM

## 2011-07-08 DIAGNOSIS — Y998 Other external cause status: Secondary | ICD-10-CM

## 2011-07-08 DIAGNOSIS — F3289 Other specified depressive episodes: Secondary | ICD-10-CM

## 2011-07-08 DIAGNOSIS — E785 Hyperlipidemia, unspecified: Secondary | ICD-10-CM

## 2011-07-08 DIAGNOSIS — Z9189 Other specified personal risk factors, not elsewhere classified: Secondary | ICD-10-CM

## 2011-07-08 DIAGNOSIS — E872 Acidosis, unspecified: Secondary | ICD-10-CM

## 2011-07-08 DIAGNOSIS — G43909 Migraine, unspecified, not intractable, without status migrainosus: Secondary | ICD-10-CM

## 2011-07-08 DIAGNOSIS — E1165 Type 2 diabetes mellitus with hyperglycemia: Secondary | ICD-10-CM | POA: Diagnosis present

## 2011-07-08 DIAGNOSIS — IMO0002 Reserved for concepts with insufficient information to code with codable children: Secondary | ICD-10-CM | POA: Diagnosis present

## 2011-07-08 DIAGNOSIS — E875 Hyperkalemia: Secondary | ICD-10-CM

## 2011-07-08 DIAGNOSIS — R000 Tachycardia, unspecified: Secondary | ICD-10-CM

## 2011-07-08 DIAGNOSIS — D239 Other benign neoplasm of skin, unspecified: Secondary | ICD-10-CM

## 2011-07-08 DIAGNOSIS — M129 Arthropathy, unspecified: Secondary | ICD-10-CM

## 2011-07-08 DIAGNOSIS — H9190 Unspecified hearing loss, unspecified ear: Secondary | ICD-10-CM

## 2011-07-08 DIAGNOSIS — I959 Hypotension, unspecified: Secondary | ICD-10-CM | POA: Diagnosis present

## 2011-07-08 DIAGNOSIS — E119 Type 2 diabetes mellitus without complications: Secondary | ICD-10-CM

## 2011-07-08 DIAGNOSIS — I428 Other cardiomyopathies: Secondary | ICD-10-CM

## 2011-07-08 HISTORY — DX: Postherpetic polyneuropathy: B02.23

## 2011-07-08 LAB — BASIC METABOLIC PANEL
Chloride: 109 mEq/L (ref 96–112)
GFR calc Af Amer: 6 mL/min — ABNORMAL LOW (ref >90)
GFR calc non Af Amer: 5 mL/min — ABNORMAL LOW (ref >90)
Potassium: >7.5 mEq/L (ref 3.5–5.1)
Sodium: 134 mEq/L — ABNORMAL LOW (ref 135–145)

## 2011-07-08 LAB — URINALYSIS, ROUTINE W REFLEX MICROSCOPIC
Leukocytes, UA: NEGATIVE
Nitrite: NEGATIVE
Specific Gravity, Urine: 1.017 (ref 1.005–1.030)
Urobilinogen, UA: 0.2 mg/dL (ref 0.0–1.0)
pH: 5 (ref 5.0–8.0)

## 2011-07-08 LAB — POCT I-STAT, CHEM 8
BUN: 127 mg/dL — ABNORMAL HIGH (ref 6–23)
Calcium, Ion: 1.17 mmol/L (ref 1.12–1.32)
Chloride: 120 mEq/L — ABNORMAL HIGH (ref 96–112)
Creatinine, Ser: 7.6 mg/dL — ABNORMAL HIGH (ref 0.50–1.10)
Glucose, Bld: 212 mg/dL — ABNORMAL HIGH (ref 70–99)

## 2011-07-08 LAB — DIFFERENTIAL
Basophils Relative: 0 % (ref 0–1)
Eosinophils Absolute: 0.4 10*3/uL (ref 0.0–0.7)
Lymphs Abs: 3.5 10*3/uL (ref 0.7–4.0)
Neutro Abs: 8.1 10*3/uL — ABNORMAL HIGH (ref 1.7–7.7)
Neutrophils Relative %: 63 % (ref 43–77)

## 2011-07-08 LAB — CBC
MCH: 30.6 pg (ref 26.0–34.0)
MCHC: 32.2 g/dL (ref 30.0–36.0)
Platelets: 307 10*3/uL (ref 150–400)
RBC: 3.53 MIL/uL — ABNORMAL LOW (ref 3.87–5.11)

## 2011-07-08 LAB — URINE MICROSCOPIC-ADD ON

## 2011-07-08 LAB — PREGNANCY, URINE: Preg Test, Ur: NEGATIVE

## 2011-07-08 MED ORDER — ACETAMINOPHEN 80 MG PO CHEW
500.0000 mg | CHEWABLE_TABLET | ORAL | Status: DC | PRN
  Filled 2011-07-08: qty 7

## 2011-07-08 MED ORDER — ACETAMINOPHEN 160 MG/5ML PO SOLN
500.0000 mg | ORAL | Status: DC | PRN
  Filled 2011-07-08: qty 20.3

## 2011-07-08 MED ORDER — SODIUM CHLORIDE 0.9 % IV BOLUS (SEPSIS)
1000.0000 mL | Freq: Once | INTRAVENOUS | Status: AC
  Administered 2011-07-08: 1000 mL via INTRAVENOUS

## 2011-07-08 MED ORDER — ALBUTEROL SULFATE (5 MG/ML) 0.5% IN NEBU
2.5000 mg | INHALATION_SOLUTION | Freq: Once | RESPIRATORY_TRACT | Status: AC
  Administered 2011-07-08: 2.5 mg via RESPIRATORY_TRACT
  Filled 2011-07-08: qty 0.5

## 2011-07-08 MED ORDER — SODIUM BICARBONATE 8.4 % IV SOLN
50.0000 meq | Freq: Once | INTRAVENOUS | Status: AC
  Administered 2011-07-08: 50 meq via INTRAVENOUS

## 2011-07-08 MED ORDER — SODIUM BICARBONATE 8.4 % IV SOLN
INTRAVENOUS | Status: AC
  Filled 2011-07-08: qty 50

## 2011-07-08 MED ORDER — INSULIN ASPART 100 UNIT/ML ~~LOC~~ SOLN
SUBCUTANEOUS | Status: AC
  Filled 2011-07-08: qty 1

## 2011-07-08 MED ORDER — SODIUM CHLORIDE 0.9 % IV SOLN: INTRAVENOUS | Status: DC

## 2011-07-08 MED ORDER — DEXTROSE 50 % IV SOLN
50.0000 mL | Freq: Once | INTRAVENOUS | Status: AC
  Administered 2011-07-08: 50 mL via INTRAVENOUS

## 2011-07-08 MED ORDER — SODIUM POLYSTYRENE SULFONATE 15 GM/60ML PO SUSP
ORAL | Status: AC
  Administered 2011-07-08: 60 g via ORAL
  Filled 2011-07-08: qty 60

## 2011-07-08 MED ORDER — DEXTROSE 50 % IV SOLN
INTRAVENOUS | Status: AC
  Administered 2011-07-08: 50 mL via INTRAVENOUS
  Filled 2011-07-08: qty 50

## 2011-07-08 MED ORDER — CALCIUM GLUCONATE 10 % IV SOLN
1.0000 g | Freq: Once | INTRAVENOUS | Status: AC
  Administered 2011-07-08: 1 g via INTRAVENOUS
  Filled 2011-07-08: qty 10

## 2011-07-08 MED ORDER — SODIUM POLYSTYRENE SULFONATE 15 GM/60ML PO SUSP
60.0000 g | Freq: Once | ORAL | Status: AC
  Administered 2011-07-08: 60 g via ORAL
  Filled 2011-07-08: qty 180

## 2011-07-08 MED ORDER — INSULIN REGULAR HUMAN 100 UNIT/ML IJ SOLN
10.0000 [IU] | Freq: Once | INTRAMUSCULAR | Status: AC
  Administered 2011-07-08: 10 [IU] via INTRAVENOUS
  Filled 2011-07-08: qty 0.1

## 2011-07-08 MED ORDER — SODIUM CHLORIDE 0.9 % IV BOLUS (SEPSIS)
500.0000 mL | Freq: Once | INTRAVENOUS | Status: AC
  Administered 2011-07-08: 500 mL via INTRAVENOUS

## 2011-07-08 NOTE — Telephone Encounter (Signed)
Reduce Gabapentin to 1 qd or stop F/u w/Dr Asa Lente Thx

## 2011-07-08 NOTE — ED Notes (Addendum)
NT in room doing repeat EKG

## 2011-07-08 NOTE — ED Notes (Signed)
Patient transported to CT 

## 2011-07-08 NOTE — ED Notes (Signed)
[facility] notified for transport to [facility].

## 2011-07-08 NOTE — ED Notes (Signed)
CAR:[id number]<AD> Expected date:<BR> Expected time:<BR> Means of arrival:<BR> Comments:<BR> EMS 34

## 2011-07-08 NOTE — ED Notes (Signed)
Pt with bruise, small abrasion above right eye, some swelling-"goose egg"

## 2011-07-08 NOTE — ED Notes (Signed)
Pt has hight level of potassium. Currently being treated for High K. MD and RNs at bedside at this moment administering medicines.

## 2011-07-08 NOTE — Consult Note (Signed)
Carolyn Salazar 07/08/2011 [place] Requesting Physician:  Dr. Jules Husbands  Reason for Consult:  Hyperkalemia, AKI HPI: The patient is a 41 y.o. year-old female with hx cardiomyopathy and obesity presented with severe lightheadness and fatigue of 24-48 hrs.  Had shingles one week ago and prescribed valcyte. Has been taking ibuprofen for pain, and excedrin.  Then had neurontin prescribed 2d ago.  In ED K+ was >7.5, creat over 8 and BP in the 76'P systolic. Normal BP for her is in the 90's.  She had a wide complex junctional rhythm which improved to a sinus rhthym after IV calcium, Na HCO3 and insulin with glucose.  She is feeling about 50% better now.  She voided 600 cc urine, and had 2 BM's after rec'g kaexalate about 90 min ago.  No prior hx of renal failure.   Creatinine, Ser  Date/Time Value Range Status  07/08/2011  6:39 PM 7.60* 0.50-1.10 (mg/dL) Final  07/08/2011  4:30 PM 8.43* 0.50-1.10 (mg/dL) Final  05/26/2011 10:39 AM 0.7  0.4-1.2 (mg/dL) Final  04/18/2011  4:49 PM 0.8  0.4-1.2 (mg/dL) Final  08/02/2010 10:35 AM 0.7  0.4-1.2 (mg/dL) Final  08/12/2009  9:58 AM 0.8  0.4-1.2 (mg/dL) Final  07/09/2009 10:33 AM 0.7  0.4-1.2 (mg/dL) Final  10/12/2008  9:15 AM 0.8  0.4-1.2 (mg/dL) Final  05/14/2008  9:04 AM 0.6  0.4-1.2 (mg/dL) Final    Past Medical History:  Past Medical History  Diagnosis Date  . BUNIONS, BILATERAL   . CARDIOMYOPATHY 02/1999    EF 20% in 02/1999, improved over time. Echo 01/2006 - EF 50-55% EF 04/2008 35-40% EF 40-45% Echo 07/22/2009  . MIGRAINE HEADACHE   . DEPRESSION   . OBESITY   . 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 DYSLIPIDEMIA   . DIABETES MELLITUS, TYPE II, CONTROLLED, MILD   . BENIGN NEOPLASM OF SKIN SITE UNSPECIFIED   . Arthritis     Hands, Knees RT>LT  . Chest pain     Nuclear, 2007, normal ( catheterization in 2000, normal coronary arteries)  . Fluid overload   . Sinus tachycardia   . Hypotension     November,  2012  . Shingles (herpes zoster) polyneuropathy     Past Surgical History:  Past Surgical History  Procedure Date  . Cardiac catheterization   . Heart biopsy   . Vein scope   . Bunionectomy     Family History:  Family History  Problem Relation Age of Onset  . Diabetes Mother   . Cancer Mother     Ovarian, benign mass  . Diabetes type II Mother   . Diabetes Father   . Heart failure Father   . Heart attack Father   . Hypertension Other     Parent  . Hyperlipidemia Other     parent  . Arthritis Other     parent, grandparent   Social History:  reports that she has never smoked. She does not have any smokeless tobacco history on file. She reports that she drinks about .6 ounces of alcohol per week. She reports that she does not use illicit drugs.  Allergies:  Allergies  Allergen Reactions  . Sulfonamide Derivatives     REACTION: sweeling    Home medications: Prior to Admission medications   Medication Sig Start Date End Date Taking? Authorizing Provider  Calcium Carbonate-Vit D-Min 600-200 MG-UNIT TABS Take 1 tablet by mouth daily.     Yes Historical Provider, MD  carvedilol (COREG) 25  MG tablet Take 2 tablets (50 mg total) by mouth 2 (two) times daily. 09/27/10 09/27/11 Yes Carlena Bjornstad, MD  cetirizine (ZYRTEC) 10 MG tablet Take 10 mg by mouth daily.     Yes Historical Provider, MD  digoxin (LANOXIN) 0.125 MG tablet TAKE 1 TABLET BY MOUTH ONCE DAILY 04/23/11  Yes Carlena Bjornstad, MD  furosemide (LASIX) 40 MG tablet Take 40 mg by mouth 2 (two) times daily.     Yes Historical Provider, MD  gabapentin (NEURONTIN) 300 MG capsule Take 1 capsule (300 mg total) by mouth 3 (three) times daily. 07/06/11 07/05/12 Yes Gwendolyn Grant, MD  glucosamine-chondroitin 500-400 MG tablet Take 1 tablet by mouth daily.    Yes Historical Provider, MD  lamoTRIgine (LAMICTAL) 200 MG tablet Take 400 mg by mouth daily.    Yes Historical Provider, MD  lisinopril (PRINIVIL,ZESTRIL) 20 MG tablet TAKE 1  TABLET BY MOUTH EVERY DAY 09/27/10  Yes Carlena Bjornstad, MD  Multiple Vitamin (MULTI-VITAMIN PO) Take by mouth daily.     Yes Historical Provider, MD  potassium chloride SA (K-DUR,KLOR-CON) 20 MEQ tablet TAKE ONE TABLET BY MOUTH EVERY DAY 11/30/10  Yes Jolaine Artist, MD  simvastatin (ZOCOR) 20 MG tablet TAKE 1 TABLET BY MOUTH EVERY NIGHT AT BEDTIME 02/27/11  Yes Gwendolyn Grant, MD  spironolactone (ALDACTONE) 25 MG tablet 1 tab po qd 09/28/10  Yes Carlena Bjornstad, MD  vitamin C (ASCORBIC ACID) 500 MG tablet Take 500 mg by mouth daily.     Yes Historical Provider, MD  zolpidem (AMBIEN) 10 MG tablet Take 10 mg by mouth at bedtime as needed.   Yes Historical Provider, MD  FLUoxetine (PROZAC) 40 MG capsule Take 40 mg by mouth 2 (two) times daily.     Historical Provider, MD    Inpatient medications:    . albuterol  2.5 mg Nebulization Once  . calcium gluconate  1 g Intravenous Once  . dextrose  50 mL Intravenous Once  . insulin regular  10 Units Intravenous Once  . sodium bicarbonate  50 mEq Intravenous Once  . sodium bicarbonate  50 mEq Intravenous Once  . sodium chloride  1,000 mL Intravenous Once  . sodium chloride  500 mL Intravenous Once  . sodium polystyrene  60 g Oral Once    Review of Systems Gen:  Denies headache, fever, chills, sweats.  No weight loss. HEENT:  No visual change, sore throat, difficulty swallowing. Resp:  No difficulty breathing, DOE.  No cough or hemoptysis. Cardiac:  No chest pain, orthopnea, PND.  Denies edema. GI:   Denies abdominal pain.   No nausea, vomiting, diarrhea.  No constipation. GU:  Denies difficulty or change in voiding.  No change in urine color.     MS:  Denies joint pain or swelling.   Derm:  Denies skin rash or itching.  No chronic skin conditions.  Neuro:   Denies focal weakness, memory problems, hx stroke or TIA.   Psych:  Denies symptoms of depression of anxiety.  No hallucination.    Physical Exam:  Blood pressure 92/46, pulse 95,  temperature 97.8 F (36.6 C), temperature source Oral, resp. rate 22, last menstrual period 06/23/2011, SpO2 100.00%.  Gen: alert, pale, slurred speech Skin: no rash, cyanosis Neck: no JVD, bruits or LAN Chest: clear bilat Heart: regular, no rub or gallop Abdomen: soft, nontender, obese, no ascite Ext: no edema x 4 ext Neuro: alert, Ox3, no focal deficit Heme/Lymph: no bruising or LAN  Labs:  Basic Metabolic Panel:  Lab 26/33/35 1839 07/08/11 1630  NA 139 134*  K 7.3* >7.5*  CL 120* 109  CO2 -- 12*  GLUCOSE 212* 126*  BUN 127* 101*  CREATININE 7.60* 8.43*  ALB -- --  CALCIUM -- 8.7  PHOS -- --   Liver Function Tests:  Lab 07/06/11 1059  AST 15  ALT 18  ALKPHOS 62  BILITOT 0.9  PROT 7.3  ALBUMIN 4.0   No results found for this basename: LIPASE:3,AMYLASE:3 in the last 168 hours No results found for this basename: AMMONIA:3 in the last 168 hours CBC:  Lab 07/08/11 1839 07/08/11 1630 07/06/11 1059  WBC -- 13.0* 11.1*  NEUTROABS -- 8.1* 6.6  HGB 10.2* 10.8* 11.4*  HCT 30.0* 33.5* 33.2*  MCV -- 94.9 92.9  PLT -- 307 309.0   PT/INR: @labrcntip (inr:5) Cardiac Enzymes: No results found for this basename: CKTOTAL:5,CKMB:5,CKMBINDEX:5,TROPONINI:5 in the last 168 hours CBG: No results found for this basename: GLUCAP:5 in the last 168 hours  Iron Studies:  Lab 07/06/11 1059  IRON --  TIBC --  TRANSFERRIN --  FERRITIN 200.4    Xrays/Other Studies: Ct Head Wo Contrast  07/08/2011  *RADIOLOGY REPORT*  Clinical Data:  Weakness.  Altered mental status.  Fall down stairs.  Syncope.  Right frontal contusion.  CT HEAD WITHOUT CONTRAST CT CERVICAL SPINE WITHOUT CONTRAST  Technique:  Multidetector CT imaging of the head and cervical spine was performed following the standard protocol without intravenous contrast.  Multiplanar CT image reconstructions of the cervical spine were also generated.  Comparison:   None  CT HEAD  Findings: Small right maxillary sinus with partial  opacification and an air-fluid level.  This may be related to sinus disease however, orbital floor injury cannot be excluded.  If this is of concern CT imaging of the face can be obtained for further delineation.  Right orbital/supraorbital subcutaneous hematoma.  No underlying fracture.  Minimal mucosal thickening right sphenoid sinus.  No intracranial hemorrhage.  No CT evidence of large acute infarct.  Small acute infarct cannot be excluded by CT.  No intracranial mass lesion detected on this unenhanced exam.  Globes appear to be grossly intact.  IMPRESSION: Right orbital/supraorbital subcutaneous hematoma without underlying fracture or intracranial hemorrhage.  The globes appear to be intact.  Fluid level within small right maxillary sinus.  This may be related to inflammatory disease although orbital floor injury not excluded.  CT CERVICAL SPINE  Findings: No cervical spine fracture.  Mild reversal of the normal cervical reduces may be related to head position or muscle spasm.  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  IMPRESSION: No cervical spine fracture.  Please see above.  Original Report Authenticated By: Doug Sou, M.D.   Ct Cervical Spine Wo Contrast  07/08/2011  *RADIOLOGY REPORT*  Clinical Data:  Weakness.  Altered mental status.  Fall down stairs.  Syncope.  Right frontal contusion.  CT HEAD WITHOUT CONTRAST CT CERVICAL SPINE WITHOUT CONTRAST  Technique:  Multidetector CT imaging of the head and cervical spine was performed following the standard protocol without intravenous contrast.  Multiplanar CT image reconstructions of the cervical spine were also generated.  Comparison:   None  CT HEAD  Findings: Small right maxillary sinus with partial opacification and an air-fluid level.  This may be related to sinus disease however, orbital floor injury cannot be excluded.  If this is of concern CT imaging of the  face can be  obtained for further delineation.  Right orbital/supraorbital subcutaneous hematoma.  No underlying fracture.  Minimal mucosal thickening right sphenoid sinus.  No intracranial hemorrhage.  No CT evidence of large acute infarct.  Small acute infarct cannot be excluded by CT.  No intracranial mass lesion detected on this unenhanced exam.  Globes appear to be grossly intact.  IMPRESSION: Right orbital/supraorbital subcutaneous hematoma without underlying fracture or intracranial hemorrhage.  The globes appear to be intact.  Fluid level within small right maxillary sinus.  This may be related to inflammatory disease although orbital floor injury not excluded.  CT CERVICAL SPINE  Findings: No cervical spine fracture.  Mild reversal of the normal cervical reduces may be related to head position or muscle spasm.  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  IMPRESSION: No cervical spine fracture.  Please see above.  Original Report Authenticated By: Doug Sou, M.D.    Assessment/Plan 1. AKI- functional vs ATN,  prob due to taking NSAID's in setting of known CM, ACEI and poor oral intake with recent episode of shingles.  Making urine, which is reassuring.  Recommend IVF's with NS, strict I/O's, hold ACEI and avoid NSAID's, IV dye, other nephrotoxins. Check renal US tomorrow. CHeck UA.  2. Hyperkalemia- due to combination of AKI, ACEI, spironolactone, po KCL, NSAID's.  I believe she will respond to medical therapy with kaexalate and IVF's.  Check K+ q 4 hrs until under 6, and repeat kaexelate prn.  HD as needed.  Will need to be admitted at [facility] with telemetry. 3. Vol depletion 4. Hypotension- acute on chronic, baseline BP is low 90's.   5. Hx CM, idiopathic- last EF around 40-50% 6. Obesity  Thanks for the referral, we will follow.  Kelly Splinter  MD [facility] [phone] pgr    [phone] cell 07/08/2011, 7:35  PM

## 2011-07-08 NOTE — H&P (Addendum)
Hospital Admission Note Date: 07/08/2011  PCP: Gwendolyn Grant, MD, MD  Chief Complaint: Fall  History of Present Illness: This is a 41 year old female with past medical history of nonischemic cardiomyopathy with an EF of 40-45% on her last echo currently on Coreg, lisinopril, spironolactone and Lasix, but comes in for fall here in the ED CT scan of the head and neck was done that showed no fractures. She relates that 1 week ago she started having shingles. Her primary care doctor put her on Neurontin. 2 days prior to admission she started getting wobbly as she relates. And also some dizziness upon standing. She relates his dizziness as being out of balance. So she decided to call EMS. And after she called EMS she fell. She was brought here to the ED imaging was done. Labs were drawn and shows some a potassium of 7.3 a bicarbonate of 12 and a creatinine of 8. the emergency doctor talked to the nephrologist who related that she would probably be the better off transferred:. She was given 1 L of IV fluid. EKG was done that showed prolonged QRS prolonged PR and 30 T waves one in aVL V2 and V3: She was also given Kayexalate, insulin, 2 tabs of D50, 2 amps of bicarbonate, and calcium. And her EKG started to normalize with her QRS dropping from 192-138 her PR interval shortened.  We were called to admit and further evaluate. She relates no nausea vomiting diarrhea  Allergies: Sulfonamide derivatives Past Medical History  Diagnosis Date  . BUNIONS, BILATERAL   . CARDIOMYOPATHY 02/1999    EF 20% in 02/1999, improved over time. Echo 01/2006 - EF 50-55% EF 04/2008 35-40% EF 40-45% Echo 07/22/2009  . MIGRAINE HEADACHE   . DEPRESSION   . OBESITY   . 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 DYSLIPIDEMIA   . DIABETES MELLITUS, TYPE II, CONTROLLED, MILD   . BENIGN NEOPLASM OF SKIN SITE UNSPECIFIED   . Arthritis     Hands, Knees RT>LT  . Chest pain     Nuclear, 2007, normal  ( catheterization in 2000, normal coronary arteries)  . Fluid overload   . Sinus tachycardia   . Hypotension     November, 2012  . Shingles (herpes zoster) polyneuropathy    Prior to Admission medications   Medication Sig Start Date End Date Taking? Authorizing Provider  Calcium Carbonate-Vit D-Min 600-200 MG-UNIT TABS Take 1 tablet by mouth daily.     Yes Historical Provider, MD  carvedilol (COREG) 25 MG tablet Take 2 tablets (50 mg total) by mouth 2 (two) times daily. 09/27/10 09/27/11 Yes Carlena Bjornstad, MD  cetirizine (ZYRTEC) 10 MG tablet Take 10 mg by mouth daily.     Yes Historical Provider, MD  digoxin (LANOXIN) 0.125 MG tablet TAKE 1 TABLET BY MOUTH ONCE DAILY 04/23/11  Yes Carlena Bjornstad, MD  furosemide (LASIX) 40 MG tablet Take 40 mg by mouth 2 (two) times daily.     Yes Historical Provider, MD  gabapentin (NEURONTIN) 300 MG capsule Take 1 capsule (300 mg total) by mouth 3 (three) times daily. 07/06/11 07/05/12 Yes Gwendolyn Grant, MD  glucosamine-chondroitin 500-400 MG tablet Take 1 tablet by mouth daily.    Yes Historical Provider, MD  lamoTRIgine (LAMICTAL) 200 MG tablet Take 400 mg by mouth daily.    Yes Historical Provider, MD  lisinopril (PRINIVIL,ZESTRIL) 20 MG tablet TAKE 1 TABLET BY MOUTH EVERY DAY 09/27/10  Yes Carlena Bjornstad, MD  Multiple Vitamin (MULTI-VITAMIN PO) Take by mouth daily.     Yes Historical Provider, MD  potassium chloride SA (K-DUR,KLOR-CON) 20 MEQ tablet TAKE ONE TABLET BY MOUTH EVERY DAY 11/30/10  Yes Jolaine Artist, MD  simvastatin (ZOCOR) 20 MG tablet TAKE 1 TABLET BY MOUTH EVERY NIGHT AT BEDTIME 02/27/11  Yes Gwendolyn Grant, MD  spironolactone (ALDACTONE) 25 MG tablet 1 tab po qd 09/28/10  Yes Carlena Bjornstad, MD  vitamin C (ASCORBIC ACID) 500 MG tablet Take 500 mg by mouth daily.     Yes Historical Provider, MD  zolpidem (AMBIEN) 10 MG tablet Take 10 mg by mouth at bedtime as needed.   Yes Historical Provider, MD  FLUoxetine (PROZAC) 40 MG capsule Take 40  mg by mouth 2 (two) times daily.     Historical Provider, MD   Past Surgical History  Procedure Date  . Cardiac catheterization   . Heart biopsy   . Vein scope   . Bunionectomy    Family History  Problem Relation Age of Onset  . Diabetes Mother   . Cancer Mother     Ovarian, benign mass  . Diabetes type II Mother   . Diabetes Father   . Heart failure Father   . Heart attack Father   . Hypertension Other     Parent  . Hyperlipidemia Other     parent  . Arthritis Other     parent, grandparent   History   Social History  . Marital Status: Married    Spouse Name: N/A    Number of Children: N/A  . Years of Education: N/A   Occupational History  . Not on file.   Social History Main Topics  . Smoking status: Never Smoker   . Smokeless tobacco: Not on file  . Alcohol Use: 0.6 oz/week    1 Shots of liquor per week     Rare  . Drug Use: No  . Sexually Active: Yes    Birth Control/ Protection: Other-see comments   Other Topics Concern  . Not on file   Social History Narrative   Pt lives with his spouse, daughter and college friend.     REVIEW OF SYSTEMS:  Constitutional:  No weight loss, night sweats, Fevers, chills, fatigue.  HEENT:  No headaches, Difficulty swallowing,Tooth/dental problems,Sore throat,  No sneezing, itching, ear ache, nasal congestion, post nasal drip,  Cardio-vascular:  No chest pain, Orthopnea, PND, swelling in lower extremities, anasarca, dizziness, palpitations  GI:  No heartburn, indigestion, abdominal pain, nausea, vomiting, diarrhea, change in bowel habits, loss of appetite  Resp:  No shortness of breath with exertion or at rest. No excess mucus, no productive cough, No non-productive cough, No coughing up of blood.No change in color of mucus.No wheezing.No chest wall deformity  Skin:  no rash or lesions.  GU:  no dysuria, change in color of urine, no urgency or frequency. No flank pain.  Musculoskeletal:  No joint pain or swelling.  No decreased range of motion. No back pain.  Psych:  No change in mood or affect. No depression or anxiety. No memory loss.   Physical Exam: Filed Vitals:   07/08/11 1510 07/08/11 1749 07/08/11 1809 07/08/11 1909  BP: 80/59  83/47 92/46  Pulse: 85  84 95  Temp: 97.8 F (36.6 C)     TempSrc: Oral     Resp: 20  21 22   SpO2: 92% 100% 97% 100%    Intake/Output Summary (Last 24 hours) at 07/08/11 1916  Last data filed at 07/08/11 1642  Gross per 24 hour  Intake      0 ml  Output    600 ml  Net   -600 ml   BP 92/46  Pulse 95  Temp(Src) 97.8 F (36.6 C) (Oral)  Resp 22  SpO2 100%  LMP 06/23/2011  General Appearance:    Alert, cooperative, no distress, appears stated age  Head:    Normocephalic, without obvious abnormality, right forehead bruise.   Eyes:    PERRL, conjunctiva/corneas clear, EOM's intact, fundi    benign, both eyes  Ears:    Normal TM's and external ear canals, both ears  Nose:   Nares normal, septum midline, mucosa normal, no drainage    or sinus tenderness  Throat:   Dry mucose membrane  Neck:   Supple, symmetrical, trachea midline, no adenopathy;    thyroid:  no enlargement/tenderness/nodules; no carotid   bruit or JVD  Back:     Symmetric, no curvature, ROM normal, no CVA tenderness  Lungs:     Clear to auscultation bilaterally, respirations unlabored  Chest Wall:    No tenderness or deformity   Heart:    Regular rate and rhythm, S1 and S2 normal, no murmur, rub   or gallop     Abdomen:     Soft, non-tender, bowel sounds active all four quadrants,    no masses, no organomegaly        Extremities:   Extremities normal, atraumatic, no cyanosis or edema  Pulses:   2+ and symmetric all extremities  Skin:   Skin color, texture, turgor normal, no rashes or lesions  Lymph nodes:   Cervical, supraclavicular, and axillary nodes normal  Neurologic:   CNII-XII intact, normal strength, sensation and reflexes    throughout   Lab results:  Basename 07/08/11  1839 07/08/11 1630  NA 139 134*  K 7.3* >7.5*  CL 120* 109  CO2 -- 12*  GLUCOSE 212* 126*  BUN 127* 101*  CREATININE 7.60* 8.43*  CALCIUM -- 8.7  MG -- --  PHOS -- --    Basename 07/06/11 1059  AST 15  ALT 18  ALKPHOS 62  BILITOT 0.9  PROT 7.3  ALBUMIN 4.0   No results found for this basename: LIPASE:2,AMYLASE:2 in the last 72 hours  Basename 07/08/11 1839 07/08/11 1630 07/06/11 1059  WBC -- 13.0* 11.1*  NEUTROABS -- 8.1* 6.6  HGB 10.2* 10.8* --  HCT 30.0* 33.5* --  MCV -- 94.9 92.9  PLT -- 307 309.0   No results found for this basename: CKTOTAL:3,CKMB:3,CKMBINDEX:3,TROPONINI:3 in the last 72 hours No components found with this basename: POCBNP:3 No results found for this basename: DDIMER:2 in the last 72 hours  Basename 07/06/11 1059  HGBA1C 6.3   No results found for this basename: CHOL:2,HDL:2,LDLCALC:2,TRIG:2,CHOLHDL:2,LDLDIRECT:2 in the last 72 hours  Basename 07/06/11 1059  TSH 4.91  T4TOTAL --  T3FREE --  THYROIDAB --    Basename 07/06/11 1059  VITAMINB12 --  FOLATE --  FERRITIN 200.4  TIBC --  IRON --  RETICCTPCT --   Imaging results:  Ct Head Wo Contrast  07/08/2011  *RADIOLOGY REPORT*  Clinical Data:  Weakness.  Altered mental status.  Fall down stairs.  Syncope.  Right frontal contusion.  CT HEAD WITHOUT CONTRAST CT CERVICAL SPINE WITHOUT CONTRAST  Technique:  Multidetector CT imaging of the head and cervical spine was performed following the standard protocol without intravenous contrast.  Multiplanar CT image reconstructions of the cervical  spine were also generated.  Comparison:   None  CT HEAD  Findings: Small right maxillary sinus with partial opacification and an air-fluid level.  This may be related to sinus disease however, orbital floor injury cannot be excluded.  If this is of concern CT imaging of the face can be obtained for further delineation.  Right orbital/supraorbital subcutaneous hematoma.  No underlying fracture.  Minimal mucosal  thickening right sphenoid sinus.  No intracranial hemorrhage.  No CT evidence of large acute infarct.  Small acute infarct cannot be excluded by CT.  No intracranial mass lesion detected on this unenhanced exam.  Globes appear to be grossly intact.  IMPRESSION: Right orbital/supraorbital subcutaneous hematoma without underlying fracture or intracranial hemorrhage.  The globes appear to be intact.  Fluid level within small right maxillary sinus.  This may be related to inflammatory disease although orbital floor injury not excluded.  CT CERVICAL SPINE  Findings: No cervical spine fracture.  Mild reversal of the normal cervical reduces may be related to head position or muscle spasm.  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  IMPRESSION: No cervical spine fracture.  Please see above.  Original Report Authenticated By: Doug Sou, M.D.   Ct Cervical Spine Wo Contrast  07/08/2011  *RADIOLOGY REPORT*  Clinical Data:  Weakness.  Altered mental status.  Fall down stairs.  Syncope.  Right frontal contusion.  CT HEAD WITHOUT CONTRAST CT CERVICAL SPINE WITHOUT CONTRAST  Technique:  Multidetector CT imaging of the head and cervical spine was performed following the standard protocol without intravenous contrast.  Multiplanar CT image reconstructions of the cervical spine were also generated.  Comparison:   None  CT HEAD  Findings: Small right maxillary sinus with partial opacification and an air-fluid level.  This may be related to sinus disease however, orbital floor injury cannot be excluded.  If this is of concern CT imaging of the face can be obtained for further delineation.  Right orbital/supraorbital subcutaneous hematoma.  No underlying fracture.  Minimal mucosal thickening right sphenoid sinus.  No intracranial hemorrhage.  No CT evidence of large acute infarct.  Small acute infarct cannot be excluded by CT.  No intracranial mass lesion  detected on this unenhanced exam.  Globes appear to be grossly intact.  IMPRESSION: Right orbital/supraorbital subcutaneous hematoma without underlying fracture or intracranial hemorrhage.  The globes appear to be intact.  Fluid level within small right maxillary sinus.  This may be related to inflammatory disease although orbital floor injury not excluded.  CT CERVICAL SPINE  Findings: No cervical spine fracture.  Mild reversal of the normal cervical reduces may be related to head position or muscle spasm.  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  IMPRESSION: No cervical spine fracture.  Please see above.  Original Report Authenticated By: Doug Sou, M.D.   Other results: EKG: A heart rate of 81 widened QRS long PR interval. The lip T waves on aVL 1 V2 and V3.   Patient Active Hospital Problem List: 1.Acute kidney injury (07/08/2011) Is probably multifactorial secondary to her lisinopril, spironolactone, and Lasix. I have given her a 1 liter of IV fluids. And her blood pressure came up. The ED called renal for possible dialysis. And they would elect to keep on treating with IV fluids. We'll monitor her strict I.'s and O.'s the patient has put out about 600 cc of  urine after the liter bolus of fluid she was given here in the ED They would like the patient transferred to [facility].  2.DIABETES MELLITUS, TYPE II, CONTROLLED, MILD (10/08/2009)  her hemoglobin A1c is 6.3, very well controlled sliding scale insulin sensitive.   3.CARDIOMYOPATHY (02/20/1999) She is borderline hypotensive. We'll hold her Coreg, Lasix, ACE and spironolactone. And we'll give her IV fluids gently and monitor her strict I.'s and O.'s when her blood pressure starts to increase will increase her Coreg at a lower dose. And continue to titrate her medications as tolerated.   4.Hypotension () This probably multifactorial. She does describe symptoms of orthostatic  hypotension. She kept on taking her Coreg Lasix spironolactone and lisinopril. I will hold these. And continue to monitor her blood pressure very closely. Continue IV fluids.  5.Hyperkalemia (07/08/2011) This is multifactorial secondary to her acute kidney injury and ongoing use of her heart failure medication (Lasix ACE spironolactone and potassium supplement). Kayexelate q 4 hrs until K less 6.5. Follow up on b-met.  Metabolic acidosis (2/49/3241)  This probably secondary to her renal failure. We'll start giving her IV fluids, she is having good urine output. And we'll continue to follow the bicarbonate along. We have consulted renal the  Leukocytosis (07/08/2011) Is probably secondary to her acute kidney injury. She remains a febrile. Will hold on antibiotics repeat a CBC in am.   Code Status: Full code. Family Communication: Spouse [phone]   Charlynne Cousins M.D. [facility] [phone] 07/08/2011, 7:16 PM

## 2011-07-08 NOTE — ED Provider Notes (Signed)
History     CSN: [id number]  Arrival date & time 07/08/11  1500   First MD Initiated Contact with Patient 07/08/11 1527      Chief Complaint  Patient presents with  . Fall    (Consider location/radiation/quality/duration/timing/severity/associated sxs/prior treatment) Patient is a 41 y.o. female presenting with fall. The history is provided by the patient and medical records.  Fall Associated symptoms include headaches. Pertinent negatives include no fever, no abdominal pain, no nausea and no vomiting.   the patient is a 41 year old, female, with history of cardiomyopathy, diabetes, and hypertension, who presents to emergency department after she fell down the stairs.  She says that she recently started gabapentin for shingles.  She also takes digoxin, lisinopril, Dilaudid, Lasix, and spironolactone. She feels "" hazy" or as if she was drunk.  She has not been drinking etoh.  She struck her right forehead when she fell.  She did not have loss of consciousness.  .  She also has a headache. She denies nausea, vomiting.  She denies neck pain.  She denies back pain, chest pain, or abdominal pain.  She denies recent illness.  She is not taking anticoagulants.    Past Medical History  Diagnosis Date  . BUNIONS, BILATERAL   . CARDIOMYOPATHY 02/1999    EF 20% in 02/1999, improved over time. Echo 01/2006 - EF 50-55% EF 04/2008 35-40% EF 40-45% Echo 07/22/2009  . MIGRAINE HEADACHE   . DEPRESSION   . OBESITY   . 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 DYSLIPIDEMIA   . DIABETES MELLITUS, TYPE II, CONTROLLED, MILD   . BENIGN NEOPLASM OF SKIN SITE UNSPECIFIED   . Arthritis     Hands, Knees RT>LT  . Chest pain     Nuclear, 2007, normal ( catheterization in 2000, normal coronary arteries)  . Fluid overload   . Sinus tachycardia   . Hypotension     November, 2012  . Shingles (herpes zoster) polyneuropathy     Past Surgical History  Procedure Date  . Cardiac  catheterization   . Heart biopsy   . Vein scope   . Bunionectomy     Family History  Problem Relation Age of Onset  . Diabetes Mother   . Cancer Mother     Ovarian, benign mass  . Diabetes Father   . Heart failure Father   . Hypertension Other     Parent  . Hyperlipidemia Other     parent  . Arthritis Other     parent, grandparent    History  Substance Use Topics  . Smoking status: Never Smoker   . Smokeless tobacco: Not on file  . Alcohol Use: Yes     Rare    OB History    Grav Para Term Preterm Abortions TAB SAB Ect Mult Living                  Review of Systems  Constitutional: Negative for fever and chills.  HENT: Negative for nosebleeds and neck pain.   Respiratory: Negative for cough and shortness of breath.   Cardiovascular: Negative for chest pain and palpitations.  Gastrointestinal: Negative for nausea, vomiting and abdominal pain.  Genitourinary: Negative for dysuria.  Neurological: Positive for dizziness, light-headedness and headaches. Negative for weakness.  Hematological: Does not bruise/bleed easily.  Psychiatric/Behavioral: Negative for confusion.  All other systems reviewed and are negative.    Allergies  Sulfonamide derivatives  Home Medications   Current Outpatient  Rx  Name Route Sig Dispense Refill  . CALCIUM CARBONATE-VIT D-MIN 600-200 MG-UNIT PO TABS Oral Take 1 tablet by mouth daily.      Marland Kitchen CARVEDILOL 25 MG PO TABS Oral Take 2 tablets (50 mg total) by mouth 2 (two) times daily. 60 tablet 11  . CETIRIZINE HCL 10 MG PO TABS Oral Take 10 mg by mouth daily.      Marland Kitchen DIGOXIN 0.125 MG PO TABS  TAKE 1 TABLET BY MOUTH ONCE DAILY 90 tablet 3  . FUROSEMIDE 40 MG PO TABS Oral Take 40 mg by mouth 2 (two) times daily.      Marland Kitchen GABAPENTIN 300 MG PO CAPS Oral Take 1 capsule (300 mg total) by mouth 3 (three) times daily. 90 capsule 0  . GLUCOSAMINE-CHONDROITIN 500-400 MG PO TABS Oral Take 1 tablet by mouth daily.     Marland Kitchen LAMOTRIGINE 200 MG PO TABS Oral  Take 400 mg by mouth daily.     Marland Kitchen LISINOPRIL 20 MG PO TABS  TAKE 1 TABLET BY MOUTH EVERY DAY 90 tablet 5    **Patient requests 90 day supply**  . MULTI-VITAMIN PO Oral Take by mouth daily.      Marland Kitchen POTASSIUM CHLORIDE CRYS ER 20 MEQ PO TBCR  TAKE ONE TABLET BY MOUTH EVERY DAY 90 tablet 5    **Patient requests 90 day supply**  . SIMVASTATIN 20 MG PO TABS  TAKE 1 TABLET BY MOUTH EVERY NIGHT AT BEDTIME 30 tablet 5  . SPIRONOLACTONE 25 MG PO TABS  1 tab po qd 90 tablet 3  . VITAMIN C 500 MG PO TABS Oral Take 500 mg by mouth daily.      Marland Kitchen ZOLPIDEM TARTRATE 10 MG PO TABS Oral Take 10 mg by mouth at bedtime as needed.    Marland Kitchen FLUOXETINE HCL 40 MG PO CAPS Oral Take 40 mg by mouth 2 (two) times daily.       BP 80/59  Pulse 85  Temp(Src) 97.8 F (36.6 C) (Oral)  Resp 20  SpO2 92%  LMP 06/23/2011  Physical Exam  Vitals reviewed. Constitutional: She is oriented to person, place, and time. No distress.       Morbidly obese immobilized with a c-collar and on a backboard  HENT:  Head: Normocephalic.       Right forehead contusion  Eyes: Conjunctivae are normal. Pupils are equal, round, and reactive to light.  Neck: No tracheal deviation present.       Midline C-spine tenderness collar left in place  Cardiovascular: Normal rate.   No murmur heard. Pulmonary/Chest: Effort normal. No respiratory distress. She has no rales. She exhibits no tenderness.  Abdominal: Soft. She exhibits no distension. There is no tenderness.  Musculoskeletal: Normal range of motion. She exhibits no edema and no tenderness.       No thoracic or lumbar tenderness No deformities of the extremities  Neurological: She is alert and oriented to person, place, and time.       Normal strength in all extremities  Skin: Skin is warm and dry.  Psychiatric: She has a normal mood and affect.    ED Course  Procedures (including critical care time) 41 year old morbidly obese female, presents to emergency department after she had a  fall.  She has a right forehead contusion.  Her neurological examination and mental status are normal.  There are no other signs of injury.  She does have a systolic blood pressure of 80.  She is taking multiple medications that  may lower her blood pressure.  We will perform an EKG, laboratory testing, and CAT scan of her head and neck for evaluation.  She does not want pain medications at this time  Labs Reviewed  CBC - Abnormal; Notable for the following:    WBC 13.0 (*)    RBC 3.53 (*)    Hemoglobin 10.8 (*)    HCT 33.5 (*)    All other components within normal limits  DIFFERENTIAL - Abnormal; Notable for the following:    Neutro Abs 8.1 (*)    All other components within normal limits  BASIC METABOLIC PANEL - Abnormal; Notable for the following:    Sodium 134 (*)    CO2 12 (*)    Glucose, Bld 126 (*)    BUN 101 (*)    Creatinine, Ser 8.43 (*)    GFR calc non Af Amer 5 (*)    GFR calc Af Amer 6 (*)    All other components within normal limits  PREGNANCY, URINE  URINALYSIS, ROUTINE W REFLEX MICROSCOPIC   No results found.   No diagnosis found.  ED ECG REPORT   Date: 07/08/2011  EKG Time: 8:27 PM  Rate: 81  Rhythm: junctional rhythm Axis: right axis  Intervals:none  ST&T Change: nonspecific  Narrative Interpretation: junctional rhythm with wide complex and notspecific ecg changes  AFTER tx with calcium, bicarb, glucose and insulin  ED ECG REPORT   Date: 07/08/2011  EKG Time: 8:30 PM  Rate: 87  Rhythm: junctional rhythm  Axis: slight rad  Intervals:none  ST&T Change: nonspecific  Narrative Interpretation: accelerated junctional rhythm with nonspecific tw changes, QRS has narrowed compared to prior ecg   CRITICAL CARE Performed by: Neilan Rizzo P   Total critical care time: 60 min tx of hyperkalemia with Calcium, 2 amps of sodium bicarbonate, glucose, and insulin, Kayexalate, albuterol. Discussions with the nephrologist, and [facility] Repeat blood  chemistry, and EKG  Critical care time was exclusive of separately billable procedures and treating other patients.  Critical care was necessary to treat or prevent imminent or life-threatening deterioration.  Critical care was time spent personally by me on the following activities: development of treatment plan with patient and/or surrogate as well as nursing, discussions with consultants, evaluation of patient's response to treatment, examination of patient, obtaining history from patient or surrogate, ordering and performing treatments and interventions, ordering and review of laboratory studies, ordering and review of radiographic studies, pulse oximetry and re-evaluation of patient's condition.        Hyperkalemia           6:15 PM Spoke with dr. Jonnie Finner.  He will arrange dialysis.  MDM   Hyperkalemia - improved after tx in ed. Will be dialyzed Acute renal failure Hypotension possibly iatrogenic Forehead contusion. No brain or neck injury        Elmer Picker, MD 07/08/11 2036

## 2011-07-08 NOTE — ED Notes (Signed)
Pt transported to [address] at [facility] by [place].

## 2011-07-08 NOTE — ED Notes (Signed)
Per EMS, pt on phone with 911 r/t reaction to gabapentin-fell down 4-5 stairs landed on right side-pt states she was diagnosed with shingles 1 week ago and was prescribed med

## 2011-07-09 LAB — COMPREHENSIVE METABOLIC PANEL
ALT: 13 U/L (ref 0–35)
Alkaline Phosphatase: 68 U/L (ref 39–117)
CO2: 17 mEq/L — ABNORMAL LOW (ref 19–32)
Calcium: 8.5 mg/dL (ref 8.4–10.5)
GFR calc Af Amer: 11 mL/min — ABNORMAL LOW (ref >90)
GFR calc non Af Amer: 9 mL/min — ABNORMAL LOW (ref >90)
Glucose, Bld: 111 mg/dL — ABNORMAL HIGH (ref 70–99)
Sodium: 143 mEq/L (ref 135–145)

## 2011-07-09 LAB — CBC
Hemoglobin: 10.4 g/dL — ABNORMAL LOW (ref 12.0–15.0)
MCH: 31 pg (ref 26.0–34.0)
MCHC: 32.6 g/dL (ref 30.0–36.0)
Platelets: 153 10*3/uL (ref 150–400)
RBC: 3.23 MIL/uL — ABNORMAL LOW (ref 3.87–5.11)
RDW: 14.5 % (ref 11.5–15.5)
WBC: 14.6 10*3/uL — ABNORMAL HIGH (ref 4.0–10.5)

## 2011-07-09 LAB — GLUCOSE, CAPILLARY
Glucose-Capillary: 107 mg/dL — ABNORMAL HIGH (ref 70–99)
Glucose-Capillary: 148 mg/dL — ABNORMAL HIGH (ref 70–99)
Glucose-Capillary: 176 mg/dL — ABNORMAL HIGH (ref 70–99)
Glucose-Capillary: 133 mg/dL — ABNORMAL HIGH (ref 70–99)

## 2011-07-09 LAB — BASIC METABOLIC PANEL
CO2: 18 mEq/L — ABNORMAL LOW (ref 19–32)
Chloride: 111 mEq/L (ref 96–112)
Creatinine, Ser: 6.69 mg/dL — ABNORMAL HIGH (ref 0.50–1.10)
Glucose, Bld: 106 mg/dL — ABNORMAL HIGH (ref 70–99)

## 2011-07-09 MED ORDER — CALCIUM CARBONATE-VITAMIN D 500-200 MG-UNIT PO TABS
1.0000 | ORAL_TABLET | Freq: Every day | ORAL | Status: DC
  Administered 2011-07-09 – 2011-07-10 (×2): 1 via ORAL
  Filled 2011-07-09 (×3): qty 1

## 2011-07-09 MED ORDER — ONDANSETRON HCL 4 MG PO TABS: 4.0000 mg | ORAL_TABLET | Freq: Four times a day (QID) | ORAL | Status: DC | PRN

## 2011-07-09 MED ORDER — SODIUM CHLORIDE 0.9 % IJ SOLN: 3.0000 mL | Freq: Two times a day (BID) | INTRAMUSCULAR | Status: DC

## 2011-07-09 MED ORDER — SIMVASTATIN 20 MG PO TABS
20.0000 mg | ORAL_TABLET | Freq: Every day | ORAL | Status: DC
  Administered 2011-07-09: 20 mg via ORAL
  Filled 2011-07-09 (×2): qty 1

## 2011-07-09 MED ORDER — LAMOTRIGINE 200 MG PO TABS
400.0000 mg | ORAL_TABLET | Freq: Every day | ORAL | Status: DC
  Administered 2011-07-09 – 2011-07-10 (×2): 400 mg via ORAL
  Filled 2011-07-09 (×2): qty 2

## 2011-07-09 MED ORDER — ONDANSETRON HCL 4 MG/2ML IJ SOLN: 4.0000 mg | Freq: Four times a day (QID) | INTRAMUSCULAR | Status: DC | PRN

## 2011-07-09 MED ORDER — POLYETHYLENE GLYCOL 3350 17 G PO PACK
17.0000 g | PACK | Freq: Every day | ORAL | Status: DC | PRN
  Filled 2011-07-09: qty 1

## 2011-07-09 MED ORDER — FLUOXETINE HCL 20 MG PO CAPS
40.0000 mg | ORAL_CAPSULE | Freq: Two times a day (BID) | ORAL | Status: DC
  Administered 2011-07-09 – 2011-07-10 (×4): 40 mg via ORAL
  Filled 2011-07-09 (×5): qty 2

## 2011-07-09 MED ORDER — OXYCODONE HCL 5 MG PO TABS
5.0000 mg | ORAL_TABLET | ORAL | Status: DC | PRN
  Administered 2011-07-09 – 2011-07-10 (×4): 5 mg via ORAL
  Filled 2011-07-09 (×4): qty 1

## 2011-07-09 MED ORDER — SODIUM CHLORIDE 0.9 % IV SOLN
INTRAVENOUS | Status: DC
  Administered 2011-07-09 – 2011-07-10 (×4): via INTRAVENOUS

## 2011-07-09 MED ORDER — INSULIN ASPART 100 UNIT/ML ~~LOC~~ SOLN
0.0000 [IU] | Freq: Three times a day (TID) | SUBCUTANEOUS | Status: DC
  Administered 2011-07-09: 1 [IU] via SUBCUTANEOUS
  Administered 2011-07-09 (×2): 2 [IU] via SUBCUTANEOUS
  Filled 2011-07-09: qty 3

## 2011-07-09 MED ORDER — ZOLPIDEM TARTRATE 5 MG PO TABS
10.0000 mg | ORAL_TABLET | Freq: Every evening | ORAL | Status: DC | PRN
  Administered 2011-07-09 (×2): 10 mg via ORAL
  Filled 2011-07-09 (×4): qty 1

## 2011-07-09 MED ORDER — SODIUM POLYSTYRENE SULFONATE 15 GM/60ML PO SUSP
30.0000 g | ORAL | Status: AC
  Administered 2011-07-09 (×3): 30 g via ORAL
  Filled 2011-07-09 (×3): qty 120

## 2011-07-09 MED ORDER — VITAMIN C 500 MG PO TABS
500.0000 mg | ORAL_TABLET | Freq: Every day | ORAL | Status: DC
  Administered 2011-07-09 – 2011-07-10 (×2): 500 mg via ORAL
  Filled 2011-07-09 (×2): qty 1

## 2011-07-09 NOTE — Progress Notes (Signed)
Subjective: Looks a lot better, more color. Alert. Slurred speech resolved.  Objective Vital signs in last 24 hours: Filed Vitals:   07/09/11 0543 07/09/11 0555 07/09/11 0914 07/09/11 1338  BP:  96/54 98/64 129/62  Pulse:  111 107 114  Temp:  97.9 F (36.6 C) 98.1 F (36.7 C) 97.8 F (36.6 C)  TempSrc:  Oral Oral Oral  Resp:  18 20 18   Height: 5' 6"  (1.676 m)     Weight:  111.177 kg (245 lb 1.6 oz)    SpO2:  100% 97% 99%   Weight change:   Intake/Output Summary (Last 24 hours) at 07/09/11 1703 Last data filed at 07/09/11 1028  Gross per 24 hour  Intake 1646.25 ml  Output   2100 ml  Net -453.75 ml   Labs: Basic Metabolic Panel:  Lab 79/39/03 0520 07/09/11 0019 07/08/11 1839 07/08/11 1630  NA 143 141 139 134*  K 5.5* 6.4* 7.3* >7.5*  CL 114* 111 120* 109  CO2 17* 18* -- 12*  GLUCOSE 111* 106* 212* 126*  BUN 86* 89* 127* 101*  CREATININE 5.38* 6.69* 7.60* 8.43*  ALB -- -- -- --  CALCIUM 8.5 8.8 -- 8.7  PHOS -- -- -- --   Liver Function Tests:  Lab 07/09/11 0520 07/06/11 1059  AST 13 15  ALT 13 18  ALKPHOS 68 62  BILITOT 0.3 0.9  PROT 6.8 7.3  ALBUMIN 3.6 4.0   No results found for this basename: LIPASE:3,AMYLASE:3 in the last 168 hours No results found for this basename: AMMONIA:3 in the last 168 hours CBC:  Lab 07/09/11 0520 07/08/11 1839 07/08/11 1630 07/06/11 1059  WBC 13.8* -- 13.0* 11.1*  NEUTROABS -- -- 8.1* 6.6  HGB 10.4* 10.2* 10.8* 11.4*  HCT 31.9* 30.0* 33.5* 33.2*  MCV 96.1 -- 94.9 92.9  PLT 261 -- 307 309.0   PT/INR: @labrcntip (inr:5) Cardiac Enzymes: No results found for this basename: CKTOTAL:5,CKMB:5,CKMBINDEX:5,TROPONINI:5 in the last 168 hours CBG:  Lab 07/09/11 0755 07/08/11 2350  GLUCAP 148* 107*    Iron Studies:  Lab 07/06/11 1059  IRON --  TIBC --  TRANSFERRIN --  FERRITIN 200.4   Studies/Results: Ct Head Wo Contrast  07/08/2011  *RADIOLOGY REPORT*  Clinical Data:  Weakness.  Altered mental status.  Fall down stairs.   Syncope.  Right frontal contusion.  CT HEAD WITHOUT CONTRAST CT CERVICAL SPINE WITHOUT CONTRAST  Technique:  Multidetector CT imaging of the head and cervical spine was performed following the standard protocol without intravenous contrast.  Multiplanar CT image reconstructions of the cervical spine were also generated.  Comparison:   None  CT HEAD  Findings: Small right maxillary sinus with partial opacification and an air-fluid level.  This may be related to sinus disease however, orbital floor injury cannot be excluded.  If this is of concern CT imaging of the face can be obtained for further delineation.  Right orbital/supraorbital subcutaneous hematoma.  No underlying fracture.  Minimal mucosal thickening right sphenoid sinus.  No intracranial hemorrhage.  No CT evidence of large acute infarct.  Small acute infarct cannot be excluded by CT.  No intracranial mass lesion detected on this unenhanced exam.  Globes appear to be grossly intact.  IMPRESSION: Right orbital/supraorbital subcutaneous hematoma without underlying fracture or intracranial hemorrhage.  The globes appear to be intact.  Fluid level within small right maxillary sinus.  This may be related to inflammatory disease although orbital floor injury not excluded.  CT CERVICAL SPINE  Findings: No cervical  spine fracture.  Mild reversal of the normal cervical reduces may be related to head position or muscle spasm.  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  IMPRESSION: No cervical spine fracture.  Please see above.  Original Report Authenticated By: Doug Sou, M.D.   Ct Cervical Spine Wo Contrast  07/08/2011  *RADIOLOGY REPORT*  Clinical Data:  Weakness.  Altered mental status.  Fall down stairs.  Syncope.  Right frontal contusion.  CT HEAD WITHOUT CONTRAST CT CERVICAL SPINE WITHOUT CONTRAST  Technique:  Multidetector CT imaging of the head and cervical spine was performed  following the standard protocol without intravenous contrast.  Multiplanar CT image reconstructions of the cervical spine were also generated.  Comparison:   None  CT HEAD  Findings: Small right maxillary sinus with partial opacification and an air-fluid level.  This may be related to sinus disease however, orbital floor injury cannot be excluded.  If this is of concern CT imaging of the face can be obtained for further delineation.  Right orbital/supraorbital subcutaneous hematoma.  No underlying fracture.  Minimal mucosal thickening right sphenoid sinus.  No intracranial hemorrhage.  No CT evidence of large acute infarct.  Small acute infarct cannot be excluded by CT.  No intracranial mass lesion detected on this unenhanced exam.  Globes appear to be grossly intact.  IMPRESSION: Right orbital/supraorbital subcutaneous hematoma without underlying fracture or intracranial hemorrhage.  The globes appear to be intact.  Fluid level within small right maxillary sinus.  This may be related to inflammatory disease although orbital floor injury not excluded.  CT CERVICAL SPINE  Findings: No cervical spine fracture.  Mild reversal of the normal cervical reduces may be related to head position or muscle spasm.  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  IMPRESSION: No cervical spine fracture.  Please see above.  Original Report Authenticated By: Doug Sou, M.D.   Medications:    . sodium chloride 125 mL/hr at 07/09/11 1052  . DISCONTD: sodium chloride        . albuterol  2.5 mg Nebulization Once  . calcium gluconate  1 g Intravenous Once  . calcium-vitamin D  1 tablet Oral Q breakfast  . dextrose  50 mL Intravenous Once  . FLUoxetine  40 mg Oral BID  . insulin aspart  0-9 Units Subcutaneous TID WC  . insulin regular  10 Units Intravenous Once  . lamoTRIgine  400 mg Oral Daily  . simvastatin  20 mg Oral q1800  . sodium bicarbonate  50  mEq Intravenous Once  . sodium bicarbonate  50 mEq Intravenous Once  . sodium chloride  1,000 mL Intravenous Once  . sodium chloride  500 mL Intravenous Once  . sodium polystyrene  30 g Oral Q4H  . sodium polystyrene  60 g Oral Once  . vitamin C  500 mg Oral Daily  . DISCONTD: sodium chloride  3 mL Intravenous Q12H    I  have reviewed scheduled and prn medications.  Physical Exam:  Blood pressure 129/62, pulse 114, temperature 97.8 F (36.6 C), temperature source Oral, resp. rate 18, height 5' 6"  (1.676 m), weight 111.177 kg (245 lb 1.6 oz), last menstrual period 06/23/2011, SpO2 99.00%.  Gen: alert, brighter Skin: no rash, cyanosis  Neck: no JVD, bruits or LAN  Chest: clear bilat  Heart: regular, no rub or gallop  Abdomen: soft, nontender, obese,  no ascite  Ext: no edema x 4 ext  Neuro: alert, Ox3, no focal deficit  Heme/Lymph: no bruising or LAN  Assessment: 1. AKI due to combination of volume depletion, ACEI, NSAID's and known CM. Resolving.  Would probably avoid ACEI/ARB in this patient, but a cardiologist might feel differently.  2. Hyperkalemia with wide complex EKG- much better with kaexalate.    3. Vol depletion- resolving.  4. Hypotension- chronic, baseline BP is low 90's.  5. Hx CM, idiopathic- last EF around 40-50%  6. Obesity  Recommend:  Continue IVF's at 125 cc/hr, check labs again in am.   Kelly Splinter  MD [place] [phone] pgr    [phone] cell 07/09/2011, 5:03 PM

## 2011-07-09 NOTE — Progress Notes (Signed)
Lab called at 0208 and made aware RN aware that potassium now 6.4.  Made Walden Field NP aware.  Continue current plan of care.  Linus Galas 07/09/2011

## 2011-07-09 NOTE — Progress Notes (Signed)
Subjective: Weakness. Pain generalized from fall.  Objective: Vital signs in last 24 hours: Temp:  [97.4 F (36.3 C)-98.1 F (36.7 C)] 97.8 F (36.6 C) (02/17 1338) Pulse Rate:  [84-114] 114  (02/17 1338) Resp:  [16-22] 18  (02/17 1338) BP: (70-129)/(40-64) 129/62 mmHg (02/17 1338) SpO2:  [95 %-100 %] 99 % (02/17 1338) Weight:  [111.177 kg (245 lb 1.6 oz)] 111.177 kg (245 lb 1.6 oz) (02/17 0555) Weight change:  Last BM Date: 07/09/11  Intake/Output from previous day: 02/16 0701 - 02/17 0700 In: 1326.3 [P.O.:720; I.V.:606.3] Out: 1700 [Urine:1700] Total I/O In: 320 [P.O.:320] Out: 1000 [Urine:1000]   Physical Exam: General: Alert, awake, oriented x3, in no acute distress. HEENT: No bruits, no goiter. MMD. Bruising over right eye. Heart: Regular rate and rhythm, without murmurs, rubs, gallops. Lungs: Clear to auscultation bilaterally. Abdomen: Soft, nontender, nondistended, positive bowel sounds. Extremities: No clubbing cyanosis or edema with positive pedal pulses. Neuro: Grossly intact, nonfocal.  Lab Results: Basic Metabolic Panel:  Basename 07/09/11 0520 07/09/11 0019  NA 143 141  K 5.5* 6.4*  CL 114* 111  CO2 17* 18*  GLUCOSE 111* 106*  BUN 86* 89*  CREATININE 5.38* 6.69*  CALCIUM 8.5 8.8  MG -- --  PHOS -- --   Liver Function Tests:  [facility] 07/09/11 0520  AST 13  ALT 13  ALKPHOS 68  BILITOT 0.3  PROT 6.8  ALBUMIN 3.6   No results found for this basename: LIPASE:2,AMYLASE:2 in the last 72 hours No results found for this basename: AMMONIA:2 in the last 72 hours CBC:  Basename 07/09/11 0520 07/08/11 1839 07/08/11 1630  WBC 13.8* -- 13.0*  NEUTROABS -- -- 8.1*  HGB 10.4* 10.2* --  HCT 31.9* 30.0* --  MCV 96.1 -- 94.9  PLT 261 -- 307   CBG:  Basename 07/09/11 0755 07/08/11 2350  GLUCAP 148* 107*   Urinalysis:  Basename 07/08/11 1633  COLORURINE YELLOW  LABSPEC 1.017  PHURINE 5.0  GLUCOSEU NEGATIVE  HGBUR TRACE*  BILIRUBINUR  NEGATIVE  KETONESUR NEGATIVE  PROTEINUR NEGATIVE  UROBILINOGEN 0.2  NITRITE NEGATIVE  LEUKOCYTESUR NEGATIVE    No results found for this or any previous visit (from the past 240 hour(s)).  Studies/Results: Ct Head Wo Contrast  07/08/2011  *RADIOLOGY REPORT*  Clinical Data:  Weakness.  Altered mental status.  Fall down stairs.  Syncope.  Right frontal contusion.  CT HEAD WITHOUT CONTRAST CT CERVICAL SPINE WITHOUT CONTRAST  Technique:  Multidetector CT imaging of the head and cervical spine was performed following the standard protocol without intravenous contrast.  Multiplanar CT image reconstructions of the cervical spine were also generated.  Comparison:   None  CT HEAD  Findings: Small right maxillary sinus with partial opacification and an air-fluid level.  This may be related to sinus disease however, orbital floor injury cannot be excluded.  If this is of concern CT imaging of the face can be obtained for further delineation.  Right orbital/supraorbital subcutaneous hematoma.  No underlying fracture.  Minimal mucosal thickening right sphenoid sinus.  No intracranial hemorrhage.  No CT evidence of large acute infarct.  Small acute infarct cannot be excluded by CT.  No intracranial mass lesion detected on this unenhanced exam.  Globes appear to be grossly intact.  IMPRESSION: Right orbital/supraorbital subcutaneous hematoma without underlying fracture or intracranial hemorrhage.  The globes appear to be intact.  Fluid level within small right maxillary sinus.  This may be related to inflammatory disease although orbital floor injury not  excluded.  CT CERVICAL SPINE  Findings: No cervical spine fracture.  Mild reversal of the normal cervical reduces may be related to head position or muscle spasm.  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  IMPRESSION: No cervical spine fracture.  Please see above.  Original Report Authenticated By:  Doug Sou, M.D.   Ct Cervical Spine Wo Contrast  07/08/2011  *RADIOLOGY REPORT*  Clinical Data:  Weakness.  Altered mental status.  Fall down stairs.  Syncope.  Right frontal contusion.  CT HEAD WITHOUT CONTRAST CT CERVICAL SPINE WITHOUT CONTRAST  Technique:  Multidetector CT imaging of the head and cervical spine was performed following the standard protocol without intravenous contrast.  Multiplanar CT image reconstructions of the cervical spine were also generated.  Comparison:   None  CT HEAD  Findings: Small right maxillary sinus with partial opacification and an air-fluid level.  This may be related to sinus disease however, orbital floor injury cannot be excluded.  If this is of concern CT imaging of the face can be obtained for further delineation.  Right orbital/supraorbital subcutaneous hematoma.  No underlying fracture.  Minimal mucosal thickening right sphenoid sinus.  No intracranial hemorrhage.  No CT evidence of large acute infarct.  Small acute infarct cannot be excluded by CT.  No intracranial mass lesion detected on this unenhanced exam.  Globes appear to be grossly intact.  IMPRESSION: Right orbital/supraorbital subcutaneous hematoma without underlying fracture or intracranial hemorrhage.  The globes appear to be intact.  Fluid level within small right maxillary sinus.  This may be related to inflammatory disease although orbital floor injury not excluded.  CT CERVICAL SPINE  Findings: No cervical spine fracture.  Mild reversal of the normal cervical reduces may be related to head position or muscle spasm.  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  IMPRESSION: No cervical spine fracture.  Please see above.  Original Report Authenticated By: Doug Sou, M.D.    Medications: Scheduled Meds:   . albuterol  2.5 mg Nebulization Once  . calcium gluconate  1 g Intravenous Once  . calcium-vitamin D  1 tablet Oral Q  breakfast  . dextrose  50 mL Intravenous Once  . FLUoxetine  40 mg Oral BID  . insulin aspart  0-9 Units Subcutaneous TID WC  . insulin regular  10 Units Intravenous Once  . lamoTRIgine  400 mg Oral Daily  . simvastatin  20 mg Oral q1800  . sodium bicarbonate  50 mEq Intravenous Once  . sodium bicarbonate  50 mEq Intravenous Once  . sodium chloride  1,000 mL Intravenous Once  . sodium chloride  500 mL Intravenous Once  . sodium polystyrene  30 g Oral Q4H  . sodium polystyrene  60 g Oral Once  . vitamin C  500 mg Oral Daily  . DISCONTD: sodium chloride  3 mL Intravenous Q12H   Continuous Infusions:   . sodium chloride 125 mL/hr at 07/09/11 1052  . DISCONTD: sodium chloride     PRN Meds:.acetaminophen, ondansetron (ZOFRAN) IV, ondansetron, oxyCODONE, polyethylene glycol, zolpidem, DISCONTD: acetaminophen  Assessment/Plan:  Principal Problem:  *Acute kidney injury Active Problems:  DIABETES MELLITUS, TYPE II, CONTROLLED, MILD  OBESITY  CARDIOMYOPATHY  Hypotension  Hyperkalemia  Metabolic acidosis  Leukocytosis  41 y.o. year-old female with hx cardiomyopathy and obesity presented with severe lightheadness and fatigue of 24-48 hrs. Had shingles one week  ago and prescribed valcyte and taking ibuprofen, excedrin for pain. She was seen in the ED at [facility] with a K+ 7.3, BUN 127, creatinine 7.6 and BP in the 94'F systolic. (Her normal SBP run's in the 90's) She had a wide complex junctional rhythm in the ED which improved to a sinus rhthym after IV calcium, Na HCO3 and insulin with glucose. Now stable. Ms. Murry was seen in consult by renal. She also received Kayexalate as well as IV calcium, NaHCO3 and insulin with glucose.   Plan: 1. Renal Failure: Still acidotic, Scr slightly improved. Ag 12 -Renal Failure slowly resolving. Continue IV fluids.  2. Cardiomyopathy: 2D echo pending/Hypotension  3. DM: Novolog and Lantus, will follow CBGs.  4. Leukocytosis: resolving, now  14.6   LOS: 1 day   [facility] Pager: [phone] 07/09/2011, 4:15 PM

## 2011-07-09 NOTE — Progress Notes (Signed)
Transfer Accept Note  Relevant Hx: 41 y.o. year-old female with hx cardiomyopathy and obesity presented with severe lightheadness and fatigue of 24-48 hrs. Had shingles one week ago and prescribed valcyte and taking ibuprofen, excedrin for pain. She was seen in the ED at [facility] with a K+ 7.3, BUN 127, creatinine 7.6 and BP in the 70'Y systolic. (Her normal SBP run's in the 90's) She had a wide complex junctional rhythm in the ED which improved to a sinus rhthym after IV calcium, Na HCO3 and insulin with glucose.   Course: Carolyn Salazar was seen in consult by renal. She also received Kayexalate as well as IV calcium, NaHCO3 and insulin with glucose. She was transferred to [facility] for possible dialysis.  Current Status: denies nausea, vomiting, diarrhea intermittent secondary to Kayexalate, reports intermittent abdominal cramping. Denies dyspnea.  Scheduled Meds:   . albuterol  2.5 mg Nebulization Once  . calcium gluconate  1 g Intravenous Once  . calcium-vitamin D  1 tablet Oral Q breakfast  . dextrose  50 mL Intravenous Once  . FLUoxetine  40 mg Oral BID  . insulin aspart  0-9 Units Subcutaneous TID WC  . insulin regular  10 Units Intravenous Once  . lamoTRIgine  400 mg Oral Daily  . simvastatin  20 mg Oral q1800  . sodium bicarbonate  50 mEq Intravenous Once  . sodium bicarbonate  50 mEq Intravenous Once  . sodium chloride  1,000 mL Intravenous Once  . sodium chloride  500 mL Intravenous Once  . sodium polystyrene  30 g Oral Q4H  . sodium polystyrene  60 g Oral Once  . vitamin C  500 mg Oral Daily  . DISCONTD: sodium chloride  3 mL Intravenous Q12H   Continuous Infusions:   . sodium chloride    . DISCONTD: sodium chloride     PRN Meds:.acetaminophen, ondansetron (ZOFRAN) IV, ondansetron, oxyCODONE, polyethylene glycol, zolpidem, DISCONTD: acetaminophen  Physical Exam: Filed Vitals:   07/08/11 2336  BP: 95/56  Pulse: 90  Temp: 97.8 F (36.6 C)  Resp: 18    General  appearance: alert, cooperative and no distress Eyes: negative Lungs: clear to auscultation bilaterally Heart: regular rate and rhythm Abdomen: soft, non-tender; bowel sounds normal; no masses,  no organomegaly Extremities: extremities normal, atraumatic, no cyanosis or edema Neurologic: Grossly normal  Impression/Plan: 1. Acute renal failure : renal has been consulted, continued IV hydration and kayexalate. Reassess potassium now. 2. Hypotension: SBP now in 90's which is her norm. Continue hydration and monitor. 3. Diabetes : Carb consistent diet. Novolog with meals and SSI. 4. Ethics:  Full code.

## 2011-07-10 LAB — GLUCOSE, CAPILLARY

## 2011-07-10 LAB — BASIC METABOLIC PANEL
GFR calc Af Amer: 54 mL/min — ABNORMAL LOW (ref >90)
GFR calc non Af Amer: 47 mL/min — ABNORMAL LOW (ref >90)
Glucose, Bld: 95 mg/dL (ref 70–99)
Potassium: 4 mEq/L (ref 3.5–5.1)
Sodium: 143 mEq/L (ref 135–145)

## 2011-07-10 MED ORDER — CENTRUM PO CHEW: 1.0000 | CHEWABLE_TABLET | Freq: Every day | ORAL | Status: DC

## 2011-07-10 MED ORDER — OXYCODONE HCL 5 MG PO TABS: 5.0000 mg | ORAL_TABLET | ORAL | Status: DC | PRN

## 2011-07-10 MED ORDER — LIDOCAINE 5 % EX PTCH: 1.0000 | MEDICATED_PATCH | CUTANEOUS | Status: DC

## 2011-07-10 NOTE — Discharge Summary (Signed)
Physician Discharge Summary  Patient ID: Carolyn Salazar MRN: [id number] DOB/AGE: 1970/08/26 41 y.o.  Admit date: 07/08/2011 Discharge date: 07/10/2011  Primary Care Physician:  Gwendolyn Grant, MD, MD   Discharge Diagnoses:    Principal Problem:  *Acute kidney injury Active Problems:  DIABETES MELLITUS, TYPE II, CONTROLLED, MILD  OBESITY  CARDIOMYOPATHY  Hypotension  Hyperkalemia  Metabolic acidosis  Leukocytosis    Medication List  As of 07/10/2011  9:42 AM   STOP taking these medications         furosemide 40 MG tablet      gabapentin 300 MG capsule      lisinopril 20 MG tablet      potassium chloride SA 20 MEQ tablet      spironolactone 25 MG tablet         TAKE these medications         Calcium Carbonate-Vit D-Min 600-200 MG-UNIT Tabs   Take 1 tablet by mouth daily.      carvedilol 25 MG tablet   Commonly known as: COREG   Take 2 tablets (50 mg total) by mouth 2 (two) times daily.      cetirizine 10 MG tablet   Commonly known as: ZYRTEC   Take 10 mg by mouth daily.      digoxin 0.125 MG tablet   Commonly known as: LANOXIN   TAKE 1 TABLET BY MOUTH ONCE DAILY      FLUoxetine 40 MG capsule   Commonly known as: PROZAC   Take 40 mg by mouth 2 (two) times daily.      glucosamine-chondroitin 500-400 MG tablet   Take 1 tablet by mouth daily.      lamoTRIgine 200 MG tablet   Commonly known as: LAMICTAL   Take 400 mg by mouth daily.      lidocaine 5 %   Commonly known as: LIDODERM   Place 1 patch onto the skin daily. Remove & Discard patch within 12 hours or as directed by MD      MULTI-VITAMIN PO   Take by mouth daily.      multivitamin-iron-minerals-folic acid chewable tablet   Chew 1 tablet by mouth daily.      oxyCODONE 5 MG immediate release tablet   Commonly known as: Oxy IR/ROXICODONE   Take 1 tablet (5 mg total) by mouth every 4 (four) hours as needed for pain (headache, pain from fall).      simvastatin 20 MG tablet   Commonly  known as: ZOCOR   TAKE 1 TABLET BY MOUTH EVERY NIGHT AT BEDTIME      vitamin C 500 MG tablet   Commonly known as: ASCORBIC ACID   Take 500 mg by mouth daily.      zolpidem 10 MG tablet   Commonly known as: AMBIEN   Take 10 mg by mouth at bedtime as needed.             Disposition and Follow-up:    Consults: Renal   Significant Diagnostic Studies:  Ct Head Wo Contrast  07/08/2011  *RADIOLOGY REPORT*  Clinical Data:  Weakness.  Altered mental status.  Fall down stairs.  Syncope.  Right frontal contusion.  CT HEAD WITHOUT CONTRAST CT CERVICAL SPINE WITHOUT CONTRAST  Technique:  Multidetector CT imaging of the head and cervical spine was performed following the standard protocol without intravenous contrast.  Multiplanar CT image reconstructions of the cervical spine were also generated.  Comparison:   None  CT HEAD  Findings: Small right  maxillary sinus with partial opacification and an air-fluid level.  This may be related to sinus disease however, orbital floor injury cannot be excluded.  If this is of concern CT imaging of the face can be obtained for further delineation.  Right orbital/supraorbital subcutaneous hematoma.  No underlying fracture.  Minimal mucosal thickening right sphenoid sinus.  No intracranial hemorrhage.  No CT evidence of large acute infarct.  Small acute infarct cannot be excluded by CT.  No intracranial mass lesion detected on this unenhanced exam.  Globes appear to be grossly intact.  IMPRESSION: Right orbital/supraorbital subcutaneous hematoma without underlying fracture or intracranial hemorrhage.  The globes appear to be intact.  Fluid level within small right maxillary sinus.  This may be related to inflammatory disease although orbital floor injury not excluded.  CT CERVICAL SPINE  Findings: No cervical spine fracture.  Mild reversal of the normal cervical reduces may be related to head position or muscle spasm.  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  IMPRESSION: No cervical spine fracture.  Please see above.  Original Report Authenticated By: Doug Sou, M.D.   Ct Cervical Spine Wo Contrast  07/08/2011  *RADIOLOGY REPORT*  Clinical Data:  Weakness.  Altered mental status.  Fall down stairs.  Syncope.  Right frontal contusion.  CT HEAD WITHOUT CONTRAST CT CERVICAL SPINE WITHOUT CONTRAST  Technique:  Multidetector CT imaging of the head and cervical spine was performed following the standard protocol without intravenous contrast.  Multiplanar CT image reconstructions of the cervical spine were also generated.  Comparison:   None  CT HEAD  Findings: Small right maxillary sinus with partial opacification and an air-fluid level.  This may be related to sinus disease however, orbital floor injury cannot be excluded.  If this is of concern CT imaging of the face can be obtained for further delineation.  Right orbital/supraorbital subcutaneous hematoma.  No underlying fracture.  Minimal mucosal thickening right sphenoid sinus.  No intracranial hemorrhage.  No CT evidence of large acute infarct.  Small acute infarct cannot be excluded by CT.  No intracranial mass lesion detected on this unenhanced exam.  Globes appear to be grossly intact.  IMPRESSION: Right orbital/supraorbital subcutaneous hematoma without underlying fracture or intracranial hemorrhage.  The globes appear to be intact.  Fluid level within small right maxillary sinus.  This may be related to inflammatory disease although orbital floor injury not excluded.  CT CERVICAL SPINE  Findings: No cervical spine fracture.  Mild reversal of the normal cervical reduces may be related to head position or muscle spasm.  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  IMPRESSION: No cervical spine fracture.  Please see above.  Original Report Authenticated By: Doug Sou, M.D.    Hospital Course:  Principal Problem:  *Acute kidney injury Active Problems:  DIABETES MELLITUS, TYPE II, CONTROLLED, MILD  OBESITY  CARDIOMYOPATHY  Hypotension  Hyperkalemia  Metabolic acidosis  Leukocytosis  41 y.o. year-old female with hx cardiomyopathy and obesity presented with severe lightheadness and fatigue of 24-48 hrs. Had shingles one week ago and prescribed valcyte and taking ibuprofen, excedrin for pain. She was seen in the ED at [facility] with a K+ 7.3, BUN 127, creatinine 7.6 and BP in the 54'Y systolic. (Her normal SBP run's in the 90's) She had a wide complex junctional rhythm in the ED which improved to a sinus rhthym  after IV calcium, Na HCO3 and insulin with glucose. Now stable.  Ms. Rosamond was seen in consult by renal. She also received Kayexalate as well as IV calcium, NaHCO3 and insulin with glucose.    Rebnal failure dramatically improved after stopping nephrotoxic agents and IV hydration. Creatine 1.5 on discharge, will need to follow renal function as outpatient and be cautious using high dose diuretics, fluid restriction and NSAIDS.  Time spent on Discharge: 35 min  Signed: Neng Albee [facility]  07/10/2011, 9:42 AM

## 2011-07-10 NOTE — Progress Notes (Signed)
Pt. discharged to home after d/c summary reviewed and pt capable of re verbalizing medications and follow up appointments. Pt remains stable. No signs and symptoms of distress. Educated to return to ER in the event of SOB, dizziness, chest pain, or fainting. Lesslie Mckeehan, RN  

## 2011-07-10 NOTE — Telephone Encounter (Signed)
Pt informed of MD's advisement via VM and to callback office with any questions/concerns.

## 2011-07-10 NOTE — Progress Notes (Signed)
   CARE MANAGEMENT NOTE 07/10/2011  Patient:  Carolyn Salazar, Carolyn Salazar   Account Number:  [id number]  Date Initiated:  07/10/2011  Documentation initiated by:  Tomi Bamberger  Subjective/Objective Assessment:   dx acute kidney injury  admit- lives with spouse. pta independent.     Action/Plan:   continue with progression of care.   Anticipated DC Date:  07/10/2011   Anticipated DC Plan:  [place]  CM consult      Choice offered to / List presented to:             Status of service:  Completed, signed off Medicare Important Message given?   (If response is "NO", the following Medicare IM given date fields will be blank) Date Medicare IM given:   Date Additional Medicare IM given:    Discharge Disposition:  HOME/SELF CARE  Per UR Regulation:    Comments:  PCP De. Leschber  07/10/11 14:12 Tomi Bamberger RN, BSN [phone] patient lives with spouse, pta independent, has medication coverage.  Patient for dc to home, no needs identified.

## 2011-07-10 NOTE — Progress Notes (Addendum)
Patient dramatically improved today. Creatinine is now near normal at 1.3. Acute Renal failure in setting of NSAIDS, high dose diuretics, fluid restriction and gabapentin for shingles pain. Plan on d/c today.

## 2011-07-10 NOTE — Progress Notes (Signed)
Subjective: Interval History: none.  Objective: Vital signs in last 24 hours: Temp:  [97.7 F (36.5 C)-99.5 F (37.5 C)] 98.8 F (37.1 C) (02/18 0945) Pulse Rate:  [88-117] 88  (02/18 0945) Resp:  [18-20] 20  (02/18 0945) BP: (93-129)/(62-68) 112/65 mmHg (02/18 0945) SpO2:  [94 %-99 %] 94 % (02/18 0945) Weight:  [111.6 kg (246 lb 0.5 oz)] 111.6 kg (246 lb 0.5 oz) (02/17 2140) Weight change: 0.423 kg (14.9 oz)  Intake/Output from previous day: 02/17 0701 - 02/18 0700 In: 3380 [P.O.:1880; I.V.:1500] Out: 1000 [Urine:1000] Intake/Output this shift: Total I/O In: 480 [P.O.:480] Out: 400 [Urine:400]  General appearance: alert, cooperative and pale Resp: clear to auscultation bilaterally Cardio: S1, S2 normal and systolic murmur: holosystolic 2/6, blowing at apex GI: obese, pos bs, liver down 2 cm Skin: Skin color, texture, turgor normal. No rashes or lesions  Lab Results:  [facility] 07/09/11 2116 07/09/11 0520  WBC 14.6* 13.8*  HGB 10.0* 10.4*  HCT 30.4* 31.9*  PLT 153 261   BMET:  Basename 07/10/11 0730 07/09/11 0520  NA 143 143  K 4.0 5.5*  CL 114* 114*  CO2 20 17*  GLUCOSE 95 111*  BUN 24* 86*  CREATININE 1.39* 5.38*  CALCIUM 7.9* 8.5   No results found for this basename: PTH:2 in the last 72 hours Iron Studies: No results found for this basename: IRON,TIBC,TRANSFERRIN,FERRITIN in the last 72 hours  Studies/Results: Ct Head Wo Contrast  07/08/2011  *RADIOLOGY REPORT*  Clinical Data:  Weakness.  Altered mental status.  Fall down stairs.  Syncope.  Right frontal contusion.  CT HEAD WITHOUT CONTRAST CT CERVICAL SPINE WITHOUT CONTRAST  Technique:  Multidetector CT imaging of the head and cervical spine was performed following the standard protocol without intravenous contrast.  Multiplanar CT image reconstructions of the cervical spine were also generated.  Comparison:   None  CT HEAD  Findings: Small right maxillary sinus with partial opacification and an air-fluid  level.  This may be related to sinus disease however, orbital floor injury cannot be excluded.  If this is of concern CT imaging of the face can be obtained for further delineation.  Right orbital/supraorbital subcutaneous hematoma.  No underlying fracture.  Minimal mucosal thickening right sphenoid sinus.  No intracranial hemorrhage.  No CT evidence of large acute infarct.  Small acute infarct cannot be excluded by CT.  No intracranial mass lesion detected on this unenhanced exam.  Globes appear to be grossly intact.  IMPRESSION: Right orbital/supraorbital subcutaneous hematoma without underlying fracture or intracranial hemorrhage.  The globes appear to be intact.  Fluid level within small right maxillary sinus.  This may be related to inflammatory disease although orbital floor injury not excluded.  CT CERVICAL SPINE  Findings: No cervical spine fracture.  Mild reversal of the normal cervical reduces may be related to head position or muscle spasm.  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  IMPRESSION: No cervical spine fracture.  Please see above.  Original Report Authenticated By: Doug Sou, M.D.   Ct Cervical Spine Wo Contrast  07/08/2011  *RADIOLOGY REPORT*  Clinical Data:  Weakness.  Altered mental status.  Fall down stairs.  Syncope.  Right frontal contusion.  CT HEAD WITHOUT CONTRAST CT CERVICAL SPINE WITHOUT CONTRAST  Technique:  Multidetector CT imaging of the head and cervical spine was performed following the standard protocol without intravenous contrast.  Multiplanar CT image reconstructions of the cervical spine  were also generated.  Comparison:   None  CT HEAD  Findings: Small right maxillary sinus with partial opacification and an air-fluid level.  This may be related to sinus disease however, orbital floor injury cannot be excluded.  If this is of concern CT imaging of the face can be obtained for further delineation.   Right orbital/supraorbital subcutaneous hematoma.  No underlying fracture.  Minimal mucosal thickening right sphenoid sinus.  No intracranial hemorrhage.  No CT evidence of large acute infarct.  Small acute infarct cannot be excluded by CT.  No intracranial mass lesion detected on this unenhanced exam.  Globes appear to be grossly intact.  IMPRESSION: Right orbital/supraorbital subcutaneous hematoma without underlying fracture or intracranial hemorrhage.  The globes appear to be intact.  Fluid level within small right maxillary sinus.  This may be related to inflammatory disease although orbital floor injury not excluded.  CT CERVICAL SPINE  Findings: No cervical spine fracture.  Mild reversal of the normal cervical reduces may be related to head position or muscle spasm.  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  IMPRESSION: No cervical spine fracture.  Please see above.  Original Report Authenticated By: Doug Sou, M.D.    I have reviewed the patient's current medications.  Assessment/Plan: 1 AKI resolving, can d/c ivf.  Avoid NSAIDs in acut illness.  Would not use ACEI for 2 - 4 weeks.   Will S/O for now.      LOS: 2 days   Kortlyn Koltz L 07/10/2011,12:28 PM

## 2011-07-11 ENCOUNTER — Telehealth: Payer: Self-pay | Admitting: Cardiology

## 2011-07-11 NOTE — Telephone Encounter (Signed)
New Msg: pt calling wanting to speak with Dr. Ron Parker about seeing MD following hospital D/C. Pt wanted to see MD as oppose to PA. Soonest available appt to see Dr. Ron Parker is in April 2013. Pt wants to see MD prior to that date.  Please return pt call to discuss further.

## 2011-07-11 NOTE — Telephone Encounter (Signed)
Pt was at [facility] for extreme dehydration.  Meds were changed and she is concerned.  She states that Dr Ron Parker needs to call the "kidney specialist"/Dr Basilio Cairo to get her meds straightened out and to let them know if her med change is ok. The following meds were stopped: Lasix, Spironolactone, K+, Lisinopril, Tylenol.  They started Gabapentin, Lidoderm patch, iron supplement, oxycodone.  She was also told to drink as many fluids as she can and to see Dr Ron Parker in the next 2-3 days.

## 2011-07-11 NOTE — Telephone Encounter (Signed)
Pt to see Richardson Dopp, PA-C for medication adjustment per Dr Ron Parker

## 2011-07-12 LAB — CULTURE, BLOOD (ROUTINE X 2)

## 2011-07-12 NOTE — Progress Notes (Signed)
Utilization review completed.  

## 2011-07-12 NOTE — Telephone Encounter (Signed)
Appt scheduled with Richardson Dopp PA-C.  Pt notified of date and time

## 2011-07-13 ENCOUNTER — Encounter: Payer: Self-pay | Admitting: Internal Medicine

## 2011-07-13 ENCOUNTER — Other Ambulatory Visit (INDEPENDENT_AMBULATORY_CARE_PROVIDER_SITE_OTHER): Payer: PRIVATE HEALTH INSURANCE

## 2011-07-13 ENCOUNTER — Ambulatory Visit (INDEPENDENT_AMBULATORY_CARE_PROVIDER_SITE_OTHER): Payer: PRIVATE HEALTH INSURANCE | Admitting: Internal Medicine

## 2011-07-13 VITALS — BP 100/72 | HR 79 | Temp 97.5°F | Ht 67.0 in | Wt 242.0 lb

## 2011-07-13 DIAGNOSIS — N920 Excessive and frequent menstruation with regular cycle: Secondary | ICD-10-CM

## 2011-07-13 DIAGNOSIS — N289 Disorder of kidney and ureter, unspecified: Secondary | ICD-10-CM

## 2011-07-13 DIAGNOSIS — I428 Other cardiomyopathies: Secondary | ICD-10-CM

## 2011-07-13 DIAGNOSIS — N921 Excessive and frequent menstruation with irregular cycle: Secondary | ICD-10-CM

## 2011-07-13 DIAGNOSIS — E875 Hyperkalemia: Secondary | ICD-10-CM

## 2011-07-13 LAB — CBC WITH DIFFERENTIAL/PLATELET
Eosinophils Absolute: 0.3 10*3/uL (ref 0.0–0.7)
Eosinophils Relative: 3.4 % (ref 0.0–5.0)
HCT: 30.8 % — ABNORMAL LOW (ref 36.0–46.0)
Lymphs Abs: 2.5 10*3/uL (ref 0.7–4.0)
MCHC: 32.5 g/dL (ref 30.0–36.0)
MCV: 94.6 fl (ref 78.0–100.0)
Monocytes Absolute: 0.6 10*3/uL (ref 0.1–1.0)
Platelets: 321 10*3/uL (ref 150.0–400.0)
RDW: 14.2 % (ref 11.5–14.6)
WBC: 9.5 10*3/uL (ref 4.5–10.5)

## 2011-07-13 LAB — BASIC METABOLIC PANEL
BUN: 7 mg/dL (ref 6–23)
CO2: 23 mEq/L (ref 19–32)
Chloride: 110 mEq/L (ref 96–112)
Glucose, Bld: 97 mg/dL (ref 70–99)
Potassium: 4.4 mEq/L (ref 3.5–5.1)

## 2011-07-13 NOTE — Patient Instructions (Addendum)
It was good to see you today. We have reviewed your prior records including labs and tests today Medications reviewed, no changes at this time. Test(s) ordered today. Your results will be called to you after review (48-72hours after test completion). If any changes need to be made, you will be notified at that time. Followup with cardiologist Nicki Reaper) as planned next week Call here if increased swelling, shortness of breath or other problems before that visit we'll make referral to gynecology for your bleeding/frequent periods. Our office will contact you regarding appointment(s) once made.

## 2011-07-13 NOTE — Progress Notes (Signed)
Subjective:    Patient ID: Carolyn Salazar, female    DOB: [birth year], 41 y.o.   MRN: [id number]  HPI here for hosp follow up - hospital a vigorous 16 through 18 for hyperkalemia in the setting of hypotension volume depletion and acute renal failure - at discharge discontinued Lasix, start lactone and potassium - has resumed carvedilol. No recurrent syncope, no edema or shortness of breath   also reviewed chronic medical issues  dyslipidemia - hx high TG 04/2008 - now faithful with low fat diet - started on statin 09/2009 for same - reports compliance with ongoing medical treatment and no changes in medication dose or frequency.  denies adverse side effects related to current therapy.   arthritis - pain is worse with cold weather and activity - pain affects bilateral MCP and bilateral knees R>L - no trauma or injury recalled, not assoc with any swelling- +FH both RA(mom) and OA (g-mom) - pain improved with "gloves" while knitting to keep hands warm and occ tylenol use   DM2, diet controlled - metabolic syndrome - +FH DM - ongoing weight loss efforts (intermittent), ongoing exercise - no PU or PD - does not check home cbg   CM hx 02/1999 - follows with cards for same - no edema or shortness of breath - no recent need for med changes   depression - follows with psyc provider for same - reports compliance with ongoing medical treatment and no changes in medication dose or frequency. denies adverse side effects related to current therapy. no si or sadness   Past Medical History  Diagnosis Date  . BUNIONS, BILATERAL   . CARDIOMYOPATHY 02/1999    EF 20% in 02/1999, improved over time. Echo 01/2006 - EF 50-55% EF 04/2008 35-40% EF 40-45% Echo 07/22/2009  . MIGRAINE HEADACHE   . DEPRESSION   . OBESITY   . 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 DYSLIPIDEMIA   . DIABETES MELLITUS, TYPE II, CONTROLLED, MILD   . BENIGN NEOPLASM OF SKIN SITE UNSPECIFIED   .  Arthritis     Hands, Knees RT>LT  . Chest pain     Nuclear, 2007, normal ( catheterization in 2000, normal coronary arteries)  . Sinus tachycardia   . Hypotension     November, 2012  . Shingles (herpes zoster) polyneuropathy     Review of Systems  Constitutional: Negative for unexpected weight change.  Respiratory: Negative for cough and shortness of breath.   Cardiovascular: Negative for chest pain and palpitations.       Objective:   Physical Exam  BP 100/72  Pulse 79  Temp(Src) 97.5 F (36.4 C) (Oral)  Ht 5' 7"  (1.702 m)  Wt 242 lb (109.77 kg)  BMI 37.90 kg/m2  SpO2 97%  LMP 06/23/2011 Wt Readings from Last 3 Encounters:  07/13/11 242 lb (109.77 kg)  07/09/11 246 lb 0.5 oz (111.6 kg)  06/29/11 242 lb 4 oz (109.884 kg)   Constitutional: She is overweight. She appears well-developed and well-nourished. No distress.   Eyes: periorbital bruising R eye - small scleral hemorrhage  Neck: Normal range of motion. Neck supple. No JVD present. No thyromegaly present.  Cardiovascular: Normal rate, regular rhythm and normal heart sounds.  No murmur heard. No BLE edema. Pulmonary/Chest: Effort normal and breath sounds normal. No respiratory distress. She has no wheezes.  Psychiatric: She has a normal mood and affect. Her behavior is normal. Judgment and thought content normal.   Lab Results  Component  Value Date   WBC 14.6* 07/09/2011   HGB 10.0* 07/09/2011   HCT 30.4* 07/09/2011   PLT 153 07/09/2011   CHOL 153 01/31/2011   TRIG 257.0* 01/31/2011   HDL 41.00 01/31/2011   LDLDIRECT 87.1 01/31/2011   ALT 13 07/09/2011   AST 13 07/09/2011   NA 143 07/10/2011   K 4.0 07/10/2011   CL 114* 07/10/2011   CREATININE 1.39* 07/10/2011   BUN 24* 07/10/2011   CO2 20 07/10/2011   TSH 4.91 07/06/2011   HGBA1C 6.3 07/06/2011       Assessment & Plan:   Syncope in setting of vol depletion, ARI and hyperkalemia - resolved in hosp 2/16-18 - events. Labs and hosp notes reviewed - recheck labs now -  continue to hold Lasix, spironolactone and KCl  menometrorrhagia - will refer to gyn as per request  Also see problem list. Medications and labs reviewed today.

## 2011-07-13 NOTE — Assessment & Plan Note (Signed)
Off diuretics and KCl since 07/10/11 due to ARI and hyperkalemia in setting of hypotension and syncope Recheck labs today- clinically euvolemic continue Bbloc and holding other meds - follow up cards as planned

## 2011-07-13 NOTE — Progress Notes (Signed)
Patient ID: Carolyn Salazar, female   DOB: Aug 09, 1970, 41 y.o.   MRN: [id number]  Received a call from [facility] regarding positive blood culture post hospitalization for micrococcus, this is common  contaminant. No further follow up needed, patient clinically not showing signs of bacteremia.    [facility] [phone]

## 2011-07-16 LAB — CULTURE, BLOOD (ROUTINE X 2): Culture: NO GROWTH

## 2011-07-18 ENCOUNTER — Ambulatory Visit (INDEPENDENT_AMBULATORY_CARE_PROVIDER_SITE_OTHER): Payer: PRIVATE HEALTH INSURANCE | Admitting: Physician Assistant

## 2011-07-18 ENCOUNTER — Encounter: Payer: Self-pay | Admitting: Physician Assistant

## 2011-07-18 VITALS — BP 95/61 | HR 86 | Ht 66.0 in | Wt 242.0 lb

## 2011-07-18 DIAGNOSIS — Z87448 Personal history of other diseases of urinary system: Secondary | ICD-10-CM | POA: Insufficient documentation

## 2011-07-18 DIAGNOSIS — I5022 Chronic systolic (congestive) heart failure: Secondary | ICD-10-CM

## 2011-07-18 DIAGNOSIS — I5042 Chronic combined systolic (congestive) and diastolic (congestive) heart failure: Secondary | ICD-10-CM | POA: Insufficient documentation

## 2011-07-18 DIAGNOSIS — I959 Hypotension, unspecified: Secondary | ICD-10-CM

## 2011-07-18 NOTE — Assessment & Plan Note (Signed)
This was likely related to a combination of nonsteroidals in the setting of dehydration and ACE inhibitor use.  Recent creatinine is normal.  As noted nephrology recommended holding off on reinitiating her ACE inhibitor for 2-4 weeks. She will followup in 3-4 weeks and we can reassess her creatinine at that time and make a decision.

## 2011-07-18 NOTE — Patient Instructions (Signed)
Your physician recommends that you schedule a follow-up appointment in: 3-4 weeks with Richardson Dopp, PA-C Your physician recommends that you return for lab work on the day of your next appt.

## 2011-07-18 NOTE — Progress Notes (Signed)
[place], [place]  [address] Phone: [phone] Fax:  [phone]  Date:  07/18/2011   Name:  Carolyn Salazar       DOB:  Jun 22, 1970 MRN:  [id number]  PCP:  Dr. Asa Lente Primary Cardiologist:  Dr. Cleatis Polka  Primary Electrophysiologist:  None    History of Present Illness: Carolyn Salazar is a 41 y.o. female who presents for post hospital follow up.  She has a history of nonischemic cardiomyopathy with variable ejection fractions in the past.  EF was 20% in 2000.  Last echo 12/12: EF 40-45%, diffuse hypokinesis, mild LAE.  Other history includes dyslipidemia, diabetes, depression.  She was last seen by Dr. Ron Parker 12/12.  She was noted to be hypotensive but this was fairly typical for her.  She was admitted 2/16-2/18 with acute renal failure.  She had had a recent bout of shingles and presented to the emergency room after a fall.  Her creatinine was 8 and her potassium was 7.8.  Her ECG demonstrated a wide complex rhythm, which I have reviewed.  She was resuscitated and her ECG improved with improving potassium.  She was seen by nephrology.  It was felt that her acute renal failure was secondary to a combination of nonsteroidals, cardiomyopathy and ACE inhibitor therapy.  Her creatinine improved and she was eventually discharged.  Creatinine at followup with her PCP 2/21 was 0.9.  Nephrology did recommend holding her ACE inhibitor for at least 2-4 weeks post discharge.  She is off of her Lasix, ACE inhibitor and Spironolactone.  Overall, she is doing well.  The patient denies chest pain, shortness of breath, syncope, orthopnea, PND or significant pedal edema.  Weights have been stable.  Past Medical History  Diagnosis Date  . BUNIONS, BILATERAL   . CARDIOMYOPATHY 02/1999    EF 20% in 02/1999, improved over time. Echo 01/2006 - EF 50-55% EF 04/2008 35-40% EF 40-45% Echo 07/22/2009  . MIGRAINE HEADACHE   . DEPRESSION   . OBESITY   . METABOLIC  SYNDROME X     hypertriglycerides 04/2008, hyperglycemia  . DYSLIPIDEMIA   . DIABETES MELLITUS, TYPE II, CONTROLLED, MILD   . BENIGN NEOPLASM OF SKIN SITE UNSPECIFIED   . Arthritis     Hands, Knees RT>LT  . Chest pain     Nuclear, 2007, normal ( catheterization in 2000, normal coronary arteries)  . Sinus tachycardia   . Hypotension     November, 2012  . Shingles (herpes zoster) polyneuropathy     Current Outpatient Prescriptions  Medication Sig Dispense Refill  . Calcium Carbonate-Vit D-Min 600-200 MG-UNIT TABS Take 1 tablet by mouth daily.        . carvedilol (COREG) 25 MG tablet Take 2 tablets (50 mg total) by mouth 2 (two) times daily.  60 tablet  11  . cetirizine (ZYRTEC) 10 MG tablet Take 10 mg by mouth daily.        . digoxin (LANOXIN) 0.125 MG tablet TAKE 1 TABLET BY MOUTH ONCE DAILY  90 tablet  3  . FLUoxetine (PROZAC) 40 MG capsule Take 40 mg by mouth 2 (two) times daily.       Marland Kitchen glucosamine-chondroitin 500-400 MG tablet Take 1 tablet by mouth daily.       Marland Kitchen lamoTRIgine (LAMICTAL) 200 MG tablet Take 400 mg by mouth daily.       . multivitamin-iron-minerals-folic acid (CENTRUM) chewable tablet Chew 1 tablet by mouth daily.  30 tablet  3  .  oxyCODONE (OXY IR/ROXICODONE) 5 MG immediate release tablet Take 1 tablet (5 mg total) by mouth every 4 (four) hours as needed for pain (headache, pain from fall).  40 tablet  0  . simvastatin (ZOCOR) 20 MG tablet TAKE 1 TABLET BY MOUTH EVERY NIGHT AT BEDTIME  30 tablet  5  . vitamin C (ASCORBIC ACID) 500 MG tablet Take 500 mg by mouth daily.        Marland Kitchen zolpidem (AMBIEN) 10 MG tablet Take 10 mg by mouth at bedtime as needed.        Allergies: Allergies  Allergen Reactions  . Sulfonamide Derivatives     REACTION: sweeling    History  Substance Use Topics  . Smoking status: Never Smoker   . Smokeless tobacco: Not on file  . Alcohol Use: 0.6 oz/week    1 Shots of liquor per week     Rare     PHYSICAL EXAM: VS:  BP 95/61  Pulse 86   Ht 5' 6"  (1.676 m)  Wt 242 lb (109.77 kg)  BMI 39.06 kg/m2  LMP 06/23/2011 Well nourished, well developed, in no acute distress HEENT: normal Neck: no JVD Cardiac:  normal S1, S2; RRR; no murmur Lungs:  clear to auscultation bilaterally, no wheezing, rhonchi or rales Abd: soft, nontender, no hepatomegaly Ext: no edema Skin: warm and dry Neuro:  CNs 2-12 intact, no focal abnormalities noted  EKG:  Sinus rhythm, heart rate 83, normal axis, diffuse T wave changes, no change from prior tracing  ASSESSMENT AND PLAN:

## 2011-07-18 NOTE — Assessment & Plan Note (Addendum)
Volume is stable.  She is tolerating being off of her diuretic.  We discussed weighing on a daily basis and to notify us if her weight goes up or she developed increased dyspnea or swelling.  I would continue to hold her Lasix, ACE inhibitor and spironolactone.  I would recommend that she followup in the next 3-4 weeks.  We can recheck her basic metabolic panel at that time.  If her creatinine remains stable, we can consider reinitiating her ACE inhibitor.

## 2011-07-18 NOTE — Assessment & Plan Note (Signed)
Blood pressure is fairly stable for her.  She is asymptomatic.

## 2011-07-20 ENCOUNTER — Encounter: Payer: Self-pay | Admitting: Gynecology

## 2011-07-20 ENCOUNTER — Other Ambulatory Visit (HOSPITAL_COMMUNITY)
Admission: RE | Admit: 2011-07-20 | Discharge: 2011-07-20 | Disposition: A | Discharge status: Home / Self Care | Payer: PRIVATE HEALTH INSURANCE | Source: Ambulatory Visit | Attending: Gynecology | Admitting: Gynecology

## 2011-07-20 ENCOUNTER — Ambulatory Visit (INDEPENDENT_AMBULATORY_CARE_PROVIDER_SITE_OTHER): Payer: PRIVATE HEALTH INSURANCE | Admitting: Gynecology

## 2011-07-20 VITALS — BP 112/70 | Ht 66.25 in | Wt 244.0 lb

## 2011-07-20 DIAGNOSIS — Z01419 Encounter for gynecological examination (general) (routine) without abnormal findings: Secondary | ICD-10-CM

## 2011-07-20 DIAGNOSIS — N926 Irregular menstruation, unspecified: Secondary | ICD-10-CM

## 2011-07-20 NOTE — Patient Instructions (Signed)
Breast Self-Exam A self breast exam may help you find changes or problems while they are still small. Do a breast self-exam:  Every month.   One week after your period (menstrual period).   On the first day of each month if you do not have periods anymore.  Look for any:  Change in breast color, size, or shape.   Dimples in your breast.   Changes in your nipples or skin.   Dry skin on your breasts or nipples.   Watery or bloody discharge from your nipples.   Feel for:  Lumps.   Thick, hard places.   Any other changes.  HOME CARE There are 3 ways to do the breast self-exam: In front of a mirror.  Lift your arms over your head and turn side to side.   Put your hands on your hips and lean down, then turn from side to side.   Bend forward and turn from side to side.  In the shower.  With soapy hands, check both breasts. Then check above and below your collarbone and your armpits.   Feel above and below your collarbone down to under your breast, and from the center of your chest to the outer edge of the armpit. Check for any lumps or hard spots.   Using the tips of your middle three fingers check your whole breast by pressing your hand over your breast in a circle or in an up and down motion.  Lying down.  Lie flat on your bed.   Put a small pillow under the breast you are going to check. On that same side, put your hand behind your head.   With your other hand, use the 3 middle fingers to feel the breast.   Move your fingers in a circle around the breast. Press firmly over all parts of the breast to feel for any lumps.  GET HELP RIGHT AWAY IF: You find any changes in your breasts so they can be checked. Document Released: 10/25/2007 Document Revised: 01/18/2011 Document Reviewed: 08/26/2008 [facility] Patient Information 2012 [place].

## 2011-07-20 NOTE — Progress Notes (Signed)
Carolyn Salazar Apr 28, 1971 [id number]   History:    41 y.o.  gravida 1 para 1 (prior tubal sterilization procedure) presented to the office for an annual gynecological examination. Patient has not had a Pap smear since 2009. Patient states she's always had normal Pap smears in the past. Her main complaint is that her cycles range from every 2-4 weeks heavy with cramps. Patient currently not sexually active. Patient has been unable to tolerate oral contraceptive pills in the past. And she suffers from anemia as a result of this. Review of her record indicates that she's been followed by the cardiologist Dr. Dola Argyle for nonischemic cardiomyopathy. Patient is also been followed by Dr. Asa Lente for her metabolic syndrome. Patient frequently does her self breast examination.  Past medical history,surgical history, family history and social history were all reviewed and documented in the EPIC chart.  Gynecologic History Patient's last menstrual period was 07/10/2011. Contraception: tubal ligation Last Pap: 2009. Results were: normal Last mammogram: Not indicated. Results were: Not indicated  Obstetric History OB History    Grav Para Term Preterm Abortions TAB SAB Ect Mult Living   1 1 1       1      # Outc Date GA Lbr Len/2nd Wgt Sex Del Anes PTL Lv   1 TRM     F SVD  No Yes       ROS:  Was performed and pertinent positives and negatives are included in the history.  Exam: chaperone present  BP 112/70  Ht 5' 6.25" (1.683 m)  Wt 244 lb (110.678 kg)  BMI 39.09 kg/m2  LMP 07/10/2011  Body mass index is 39.09 kg/(m^2).  General appearance : Well developed well nourished female. No acute distress HEENT: Neck supple, trachea midline, no carotid bruits, no thyroidmegaly Lungs: Clear to auscultation, no rhonchi or wheezes, or rib retractions  Heart: Regular rate and rhythm, no murmurs or gallops Breast:Examined in sitting and supine position were symmetrical in appearance, no palpable  masses or tenderness,  no skin retraction, no nipple inversion, no nipple discharge, no skin discoloration, no axillary or supraclavicular lymphadenopathy Abdomen: no palpable masses or tenderness, no rebound or guarding Extremities: no edema or skin discoloration or tenderness  Pelvic:  Bartholin, Urethra, Skene Glands: Within normal limits             Vagina: No gross lesions or discharge  Cervix: No gross lesions or discharge  Uterus  anteverted, normal size, shape and consistency, non-tender and mobile  Adnexa  Without masses or tenderness  Anus and perineum  normal   Rectovaginal  normal sphincter tone without palpated masses or tenderness             Hemoccult not indicated     Assessment/Plan:  41 y.o. gravida 1 para 1 due patient to the practice with worsening irregularity of her menses. Reports cycles every 2-4 weeks with passage of large clots and heavy. This is probably contributing to her anemia. Her hemoglobin done in the emergency room recently was 10.0. She will be encouraged to take iron tablet 1 tablet daily. We're going to try to make arrangements to see if she can have her Mirena IUD placed her cycle control. She had this in the past and worked well for her, since she cannot tolerate oral contraceptive pills. We did discuss and another alternative would be for an in office endometrial ablation such as with the her option technique. She is a high risk for surgery due to her  cardiomyopathy for consideration of a hysterectomy. We're going to check with her insurance company and provide documentation as needed to help cover for placement of Mirena IUD or the endometrial ablation in the office for cycle control. Pap smear was done today. No lab work was drawn since all her labs were drawn less than a month ago by her primary. She was encouraged to do her monthly self breast examination and literature information was provided.    Terrance Mass MD, 2:31 PM 07/20/2011

## 2011-07-21 ENCOUNTER — Telehealth: Payer: Self-pay

## 2011-07-21 ENCOUNTER — Other Ambulatory Visit: Payer: Self-pay | Admitting: *Deleted

## 2011-07-21 DIAGNOSIS — Z3049 Encounter for surveillance of other contraceptives: Secondary | ICD-10-CM

## 2011-07-21 MED ORDER — LEVONORGESTREL 20 MCG/24HR IU IUD: INTRAUTERINE_SYSTEM | Freq: Once | INTRAUTERINE | Status: AC

## 2011-07-21 NOTE — Progress Notes (Signed)
Mirena IUD benefits $25 to call for insert with JF.

## 2011-07-21 NOTE — Telephone Encounter (Signed)
I called patient to let her know that I checked her insurance benefits for Mirena IUD (device, insertion and removal) and also, for Her Option Endometrial Ablation. Both are covered at 100% after a $25 copayment per "Jupree" at the ins co.  Patient said without doubt she wants the Mirena IUD.  She had it in the past and it worked well.  I will let Amy H. Know that she needs to order one for the patient and the patient will call on Day One of her menses to schedule to come in for insertion.  She said her cycles are to unpredictable to schedule appt at this time.

## 2011-08-01 ENCOUNTER — Ambulatory Visit: Payer: PRIVATE HEALTH INSURANCE | Admitting: Internal Medicine

## 2011-08-01 ENCOUNTER — Encounter: Payer: Self-pay | Admitting: Physician Assistant

## 2011-08-01 ENCOUNTER — Ambulatory Visit (INDEPENDENT_AMBULATORY_CARE_PROVIDER_SITE_OTHER): Payer: PRIVATE HEALTH INSURANCE | Admitting: Physician Assistant

## 2011-08-01 VITALS — BP 108/80 | HR 68 | Ht 66.75 in | Wt 246.0 lb

## 2011-08-01 DIAGNOSIS — I5022 Chronic systolic (congestive) heart failure: Secondary | ICD-10-CM

## 2011-08-01 LAB — BASIC METABOLIC PANEL
BUN: 9 mg/dL (ref 6–23)
Calcium: 8.9 mg/dL (ref 8.4–10.5)
GFR: 88.28 mL/min (ref >60.00)
Glucose, Bld: 78 mg/dL (ref 70–99)
Sodium: 139 mEq/L (ref 135–145)

## 2011-08-01 NOTE — Patient Instructions (Addendum)
Your physician recommends that you schedule a follow-up appointment in: 4-6 [place] DR. KATZ IF NOT AVAILABLE THEN OK TO SCHEDULE WITH SCOTT WEAVER, PA-C SAME DAY DR. KATZ IS IN THE OFFICE  Your physician recommends that you return for lab work in: TODAY BMET [phone]  Your physician recommends that you return for lab work in: 08/08/11 REPEAT BMET   START LASIX 40 MG DAILY, START POTASSIUM 20 [place]

## 2011-08-01 NOTE — Progress Notes (Signed)
[name], [place]  [address] Phone: [phone] Fax:  [phone]  Date:  08/01/2011   Name:  Carolyn Salazar       DOB:  02-14-1971 MRN:  [id number]  PCP:  Dr. Asa Lente Primary Cardiologist:  Dr. Cleatis Polka  Primary Electrophysiologist:  None    History of Present Illness: Carolyn Salazar is a 41 y.o. female who presents for follow up.  She has a history of nonischemic cardiomyopathy with variable ejection fractions in the past.  EF was 20% in 2000.  Last echo 12/12: EF 40-45%, diffuse hypokinesis, mild LAE.  Other history includes dyslipidemia, diabetes, depression.  She was last seen by Dr. Ron Parker 12/12.  She was noted to be hypotensive but this was fairly typical for her.  She was admitted 2/16-2/18 with acute renal failure.  She had had a recent bout of shingles and presented to the emergency room after a fall.  Her creatinine was 8 and her potassium was 7.8.  Her ECG demonstrated a wide complex rhythm which improved with correction of K+.  ARF was felt to be secondary to a combination of nonsteroidals, cardiomyopathy and ACE inhibitor therapy.  Renal fxn improved.  Nephrology recommended holding her ACE inhibitor for at least 2-4 weeks post discharge.  She was last seen 2/26.  She was off of her furosemide, ACE inhibitor and spironolactone.  I continued to hold these.  We plan to recheck her renal function today and potentially restart her ACE inhibitor.  She notes her weight is up 4-5 pounds.  Notes increased abdominal girth.  She denies significant DOE.  Probably Class 2.  Denies chest pain or syncope.  No orthopnea, PND or edema.    Past Medical History  Diagnosis Date  . BUNIONS, BILATERAL   . CARDIOMYOPATHY 02/1999    EF 20% in 02/1999, improved over time. Echo 01/2006 - EF 50-55% EF 04/2008 35-40% EF 40-45% Echo 07/22/2009  . MIGRAINE HEADACHE   . DEPRESSION   . OBESITY   . METABOLIC SYNDROME X     hypertriglycerides 04/2008,  hyperglycemia  . DYSLIPIDEMIA   . DIABETES MELLITUS, TYPE II, CONTROLLED, MILD   . BENIGN NEOPLASM OF SKIN SITE UNSPECIFIED   . Arthritis     Hands, Knees RT>LT  . Chest pain     Nuclear, 2007, normal ( catheterization in 2000, normal coronary arteries)  . Sinus tachycardia   . Hypotension     November, 2012  . Shingles (herpes zoster) polyneuropathy     Current Outpatient Prescriptions  Medication Sig Dispense Refill  . Calcium Carbonate-Vit D-Min 600-200 MG-UNIT TABS Take 1 tablet by mouth daily.        . carvedilol (COREG) 25 MG tablet Take 2 tablets (50 mg total) by mouth 2 (two) times daily.  60 tablet  11  . cetirizine (ZYRTEC) 10 MG tablet Take 10 mg by mouth daily.        . digoxin (LANOXIN) 0.125 MG tablet TAKE 1 TABLET BY MOUTH ONCE DAILY  90 tablet  3  . FLUoxetine (PROZAC) 40 MG capsule Take 40 mg by mouth 2 (two) times daily.       . IRON PO Take by mouth daily.      Marland Kitchen lamoTRIgine (LAMICTAL) 200 MG tablet Take 400 mg by mouth daily.       . multivitamin-iron-minerals-folic acid (CENTRUM) chewable tablet Chew 1 tablet by mouth daily.  30 tablet  3  . simvastatin (ZOCOR) 20 MG tablet  TAKE 1 TABLET BY MOUTH EVERY NIGHT AT BEDTIME  30 tablet  5  . vitamin C (ASCORBIC ACID) 500 MG tablet Take 500 mg by mouth daily.        Marland Kitchen zolpidem (AMBIEN) 10 MG tablet Take 10 mg by mouth at bedtime as needed.      Marland Kitchen oxyCODONE (OXY IR/ROXICODONE) 5 MG immediate release tablet Take 1 tablet (5 mg total) by mouth every 4 (four) hours as needed for pain (headache, pain from fall).  40 tablet  0   Current Facility-Administered Medications  Medication Dose Route Frequency Provider Last Rate Last Dose  . levonorgestrel (MIRENA) 20 MCG/24HR IUD   Intrauterine Once Terrance Mass, MD        Allergies: Allergies  Allergen Reactions  . Sulfonamide Derivatives     REACTION: sweeling    History  Substance Use Topics  . Smoking status: Never Smoker   . Smokeless tobacco: Never Used  .  Alcohol Use: 0.6 oz/week    1 Shots of liquor per week     Rare    ROS:  See HPI.  No nausea, vomiting or weakness.  All other systems reviewed and negative.  PHYSICAL EXAM: VS:  BP 108/80  Pulse 68  Ht 5' 6.75" (1.695 m)  Wt 246 lb (111.585 kg)  BMI 38.82 kg/m2  LMP 07/10/2011 Well nourished, well developed, in no acute distress HEENT: normal Neck: JVP 5-6 cm Cardiac:  normal S1, S2; RRR; no murmur Lungs:  clear to auscultation bilaterally, no wheezing, rhonchi or rales Abd: soft, nontender, no hepatomegaly Ext: trace bilateral edema Skin: warm and dry Neuro:  CNs 2-12 intact, no focal abnormalities noted  EKG:  Sinus rhythm, heart rate 68, normal axis, diffuse T wave changes, no change from prior tracing  ASSESSMENT AND PLAN:   1. Chronic systolic heart failure  I think she is getting volume overloaded.  Restart Lasix at 40 mg QD and K+ 20 mEq QD.  Check a bmet today and repeat in one week.  If repeat bmet ok in one week, start Lisinopril 1.25 or 2.5 mg QD with repeat bmet one week later.  She knows to weigh herself and to let us know if weights are going up or she feels more swollen or more SOB.  Follow up with Dr. Cleatis Polka in 4-6 weeks (or me on a day he is here).     Danton Sewer, PA-C  11:43 AM 08/01/2011

## 2011-08-03 ENCOUNTER — Telehealth: Payer: Self-pay | Admitting: *Deleted

## 2011-08-03 NOTE — Telephone Encounter (Signed)
Message copied by Michae Kava on Thu Aug 03, 2011  9:12 AM ------      Message from: [place], [place] T      Created: Tue Aug 01, 2011  9:10 PM       Please notify patient that the lab results are ok.      Richardson Dopp, PA-C  9:09 PM 08/01/2011

## 2011-08-03 NOTE — Telephone Encounter (Signed)
lmom labs ok. [name]  

## 2011-08-07 ENCOUNTER — Encounter: Payer: Self-pay | Admitting: Gynecology

## 2011-08-07 ENCOUNTER — Other Ambulatory Visit: Payer: Self-pay | Admitting: Gynecology

## 2011-08-07 ENCOUNTER — Ambulatory Visit (INDEPENDENT_AMBULATORY_CARE_PROVIDER_SITE_OTHER): Payer: PRIVATE HEALTH INSURANCE | Admitting: Gynecology

## 2011-08-07 VITALS — BP 126/88

## 2011-08-07 DIAGNOSIS — Z30431 Encounter for routine checking of intrauterine contraceptive device: Secondary | ICD-10-CM

## 2011-08-07 DIAGNOSIS — Z30430 Encounter for insertion of intrauterine contraceptive device: Secondary | ICD-10-CM

## 2011-08-07 DIAGNOSIS — Z3049 Encounter for surveillance of other contraceptives: Secondary | ICD-10-CM

## 2011-08-07 NOTE — Progress Notes (Signed)
Patient is a 41 year old gravida 1 para 1 (prior tubal sterilization procedure) who was seen the office October 28 for her annual gynecological examination see previous note. Patient suffers from heavy menstrual cycles and is not sexually active. As a result of this she has suffer from our deficiency anemia for which she's currently on iron supplementation. She cannot tolerate oral contraceptive pills. She is here today for placement of Mirena IUD for which literature information been provided on previous visit. Consent form was signed today and all questions were answered. Patient's fully aware that this form of contraception is 99% effective and is good for 5 years.  Exam: Bartholin urethra Skene was within normal limits Vagina: No lesions or discharge Cervix: No lesions or discharge Uterus: Anteverted normal size shape and consistency Adnexa: No palpable masses or tenderness Rectal: Not examined  The cervix was cleansed with Betadine solution and the anterior cervical lip was grasped with a single-tooth tenaculum. The uterus sounded to 7 cm. The Mirena IUD were shown to the patient and was inserted in a sterile fashion and the string was cut. The single-tooth tenaculum was removed there was no bleeding. Patient was released home to return back in one month for followup.

## 2011-08-08 ENCOUNTER — Other Ambulatory Visit: Payer: PRIVATE HEALTH INSURANCE

## 2011-08-10 ENCOUNTER — Other Ambulatory Visit (INDEPENDENT_AMBULATORY_CARE_PROVIDER_SITE_OTHER): Payer: PRIVATE HEALTH INSURANCE

## 2011-08-10 DIAGNOSIS — I5022 Chronic systolic (congestive) heart failure: Secondary | ICD-10-CM

## 2011-08-10 LAB — BASIC METABOLIC PANEL
CO2: 26 mEq/L (ref 19–32)
Chloride: 103 mEq/L (ref 96–112)
Potassium: 4.2 mEq/L (ref 3.5–5.1)
Sodium: 137 mEq/L (ref 135–145)

## 2011-08-11 ENCOUNTER — Telehealth: Payer: Self-pay | Admitting: *Deleted

## 2011-08-11 DIAGNOSIS — I5022 Chronic systolic (congestive) heart failure: Secondary | ICD-10-CM

## 2011-08-11 MED ORDER — LISINOPRIL 2.5 MG PO TABS: 2.5000 mg | ORAL_TABLET | Freq: Every day | ORAL | Status: DC

## 2011-08-11 NOTE — Telephone Encounter (Signed)
Message copied by Michae Kava on Fri Aug 11, 2011  3:15 PM ------      Message from: [place], [place] T      Created: Thu Aug 10, 2011  1:18 PM       Ok      She can now resume ACE - start Lisinopril 2.5 mg QD      Repeat BMET in one week      Richardson Dopp, PA-C  1:18 PM 08/10/2011

## 2011-08-11 NOTE — Telephone Encounter (Signed)
Pt notified of lab results and to start on lisinopril 2.5 mg daily, rx sent in today to [facility] , repeat bmet 08/21/11 pt will out of town until then.  Julaine Hua

## 2011-08-21 ENCOUNTER — Other Ambulatory Visit: Payer: PRIVATE HEALTH INSURANCE

## 2011-09-07 ENCOUNTER — Ambulatory Visit (INDEPENDENT_AMBULATORY_CARE_PROVIDER_SITE_OTHER): Payer: PRIVATE HEALTH INSURANCE | Admitting: Gynecology

## 2011-09-07 ENCOUNTER — Other Ambulatory Visit: Payer: Self-pay | Admitting: Gynecology

## 2011-09-07 ENCOUNTER — Encounter: Payer: Self-pay | Admitting: Gynecology

## 2011-09-07 DIAGNOSIS — Z30431 Encounter for routine checking of intrauterine contraceptive device: Secondary | ICD-10-CM

## 2011-09-07 DIAGNOSIS — D649 Anemia, unspecified: Secondary | ICD-10-CM

## 2011-09-07 DIAGNOSIS — Z1231 Encounter for screening mammogram for malignant neoplasm of breast: Secondary | ICD-10-CM

## 2011-09-07 NOTE — Patient Instructions (Signed)
Remember to schedule your mammogram

## 2011-09-07 NOTE — Progress Notes (Signed)
41 y.o. gravida 1 para 1 (prior tubal sterilization procedure) presented to the office today for followup one month after having placed a Mirena IUD. Patient was seen on February 28th for gynecological examination and Pap smear (she had not had one since 2009). She had suffered from heavy menstrual cycles sometimes lasting as much as 2-4 weeks which has contributed to her iron deficiency anemia. Although she has had a tubal ligation in the past the Mirena IUD was placed in an effort to control her menses since she cannot tolerate oral contraceptive pills. Patient states she's always had normal Pap smears in the past. Pap smear here in our office in February 2013 was normal. Review of her record indicated that she's been followed by the cardiologist Dr. Dola Argyle for nonischemic cardiomyopathy. Patient is also been followed by Dr. Asa Lente for her metabolic syndrome. Patient frequently does her self breast examination.   Patient with no complaints since the Mirena IUD was placed. She's had some minimal spotting as to be expected. Since she started the iron supplementation in February she states that her energy level is back to normal. Exam: Bartholin urethra Skene glands: Within normal limits Vagina: No lesions or discharge Cervix: IUD string seen Uterus: Anteverted normal size shape and consistency Adnexa: No palpable masses or tenderness Rectal exam: Not done  Assessment/plan: Patient status post one month IUD placement for menstrual control (Mirena IUD) and anemia. Patient on iron supplementation daily. We'll check CBC today. Last hemoglobin February 2013 was 10 g. Patient with no prior mammogram, requisition was provided. We'll need to see her back in one year for gynecological examination or when necessary.

## 2011-09-08 LAB — CBC WITH DIFFERENTIAL/PLATELET
Eosinophils Absolute: 0.3 10*3/uL (ref 0.0–0.7)
Eosinophils Relative: 3 % (ref 0–5)
Hemoglobin: 13.2 g/dL (ref 12.0–15.0)
Lymphs Abs: 2.9 10*3/uL (ref 0.7–4.0)
MCH: 29.4 pg (ref 26.0–34.0)
MCV: 95.5 fL (ref 78.0–100.0)
Monocytes Relative: 6 % (ref 3–12)
Neutrophils Relative %: 60 % (ref 43–77)
RBC: 4.49 MIL/uL (ref 3.87–5.11)

## 2011-09-15 ENCOUNTER — Ambulatory Visit: Payer: PRIVATE HEALTH INSURANCE | Admitting: Cardiology

## 2011-10-06 ENCOUNTER — Ambulatory Visit: Payer: PRIVATE HEALTH INSURANCE

## 2011-10-20 ENCOUNTER — Other Ambulatory Visit: Payer: Self-pay | Admitting: Cardiology

## 2011-11-30 ENCOUNTER — Encounter: Payer: Self-pay | Admitting: Cardiology

## 2011-12-12 ENCOUNTER — Other Ambulatory Visit: Payer: Self-pay | Admitting: *Deleted

## 2011-12-12 DIAGNOSIS — I5022 Chronic systolic (congestive) heart failure: Secondary | ICD-10-CM

## 2011-12-12 MED ORDER — CARVEDILOL 25 MG PO TABS: 50.0000 mg | ORAL_TABLET | Freq: Two times a day (BID) | ORAL | Status: DC

## 2011-12-12 MED ORDER — LISINOPRIL 2.5 MG PO TABS: 2.5000 mg | ORAL_TABLET | Freq: Every day | ORAL | Status: DC

## 2011-12-12 MED ORDER — DIGOXIN 125 MCG PO TABS: 0.1250 mg | ORAL_TABLET | Freq: Every day | ORAL | Status: DC

## 2011-12-12 MED ORDER — SIMVASTATIN 20 MG PO TABS: 20.0000 mg | ORAL_TABLET | Freq: Every day | ORAL | Status: DC

## 2012-01-04 ENCOUNTER — Ambulatory Visit: Payer: PRIVATE HEALTH INSURANCE | Admitting: Internal Medicine

## 2012-01-04 DIAGNOSIS — Z0289 Encounter for other administrative examinations: Secondary | ICD-10-CM

## 2012-01-19 ENCOUNTER — Other Ambulatory Visit: Payer: Self-pay | Admitting: Internal Medicine

## 2012-01-19 ENCOUNTER — Other Ambulatory Visit: Payer: Self-pay | Admitting: Cardiology

## 2012-07-05 ENCOUNTER — Other Ambulatory Visit: Payer: Self-pay | Admitting: Internal Medicine

## 2012-07-19 ENCOUNTER — Ambulatory Visit
Admission: RE | Admit: 2012-07-19 | Discharge: 2012-07-19 | Disposition: A | Discharge status: Home / Self Care | Payer: PRIVATE HEALTH INSURANCE | Source: Ambulatory Visit | Attending: Gynecology | Admitting: Gynecology

## 2012-07-19 DIAGNOSIS — Z1231 Encounter for screening mammogram for malignant neoplasm of breast: Secondary | ICD-10-CM

## 2012-07-25 ENCOUNTER — Encounter: Payer: Self-pay | Admitting: Gynecology

## 2012-07-25 ENCOUNTER — Ambulatory Visit (INDEPENDENT_AMBULATORY_CARE_PROVIDER_SITE_OTHER): Payer: PRIVATE HEALTH INSURANCE | Admitting: Gynecology

## 2012-07-25 VITALS — BP 128/82 | Ht 65.5 in | Wt 250.0 lb

## 2012-07-25 DIAGNOSIS — N898 Other specified noninflammatory disorders of vagina: Secondary | ICD-10-CM

## 2012-07-25 LAB — WET PREP FOR TRICH, YEAST, CLUE: WBC, Wet Prep HPF POC: NONE SEEN

## 2012-07-25 NOTE — Progress Notes (Signed)
Spotting only with iud

## 2012-07-25 NOTE — Progress Notes (Signed)
Carolyn Salazar March 30, 1971 [id number]   History:    42 y.o.  for annual gyn exam with no major complaints except a slight mucousy discharge at times. She does have a Mirena IUD that was placed in 2013. Her primary physician is Dr. Ripley Fraise who is been following her for metabolic syndrome. Patient scheduled to see her next week and will be obtaining her lab work at that time. She also has been seen in the past by Dr. Ron Parker for nonischemic cardiomyopathy. She has an appointment with her cardiologist in the next few weeks as well. Patient states her cycles by larger regular very light. Her last mammogram was in February this year which was normal. Patient does her monthly self breast examination. Patient with no prior history of abnormal Pap smears.  Past medical history,surgical history, family history and social history were all reviewed and documented in the EPIC chart.  Gynecologic History No LMP recorded. Patient is not currently having periods (Reason: IUD). Contraception: IUD Last Pap: 2013. Results were: normal Last mammogram: 2014. Results were: normal  Obstetric History OB History   Grav Para Term Preterm Abortions TAB SAB Ect Mult Living   1 1 1       1      # Outc Date GA Lbr Len/2nd Wgt Sex Del Anes PTL Lv   1 TRM     F SVD  No Yes       ROS: A ROS was performed and pertinent positives and negatives are included in the history.  GENERAL: No fevers or chills. HEENT: No change in vision, no earache, sore throat or sinus congestion. NECK: No pain or stiffness. CARDIOVASCULAR: No chest pain or pressure. No palpitations. PULMONARY: No shortness of breath, cough or wheeze. GASTROINTESTINAL: No abdominal pain, nausea, vomiting or diarrhea, melena or bright red blood per rectum. GENITOURINARY: No urinary frequency, urgency, hesitancy or dysuria. MUSCULOSKELETAL: No joint or muscle pain, no back pain, no recent trauma. DERMATOLOGIC: No rash, no itching, no lesions. ENDOCRINE: No  polyuria, polydipsia, no heat or cold intolerance. No recent change in weight. HEMATOLOGICAL: No anemia or easy bruising or bleeding. NEUROLOGIC: No headache, seizures, numbness, tingling or weakness. PSYCHIATRIC: No depression, no loss of interest in normal activity or change in sleep pattern.     Exam: chaperone present  BP 128/82  Ht 5' 5.5" (1.664 m)  Wt 250 lb (113.399 kg)  BMI 40.95 kg/m2  Body mass index is 40.95 kg/(m^2).  General appearance : Well developed well nourished female. No acute distress HEENT: Neck supple, trachea midline, no carotid bruits, no thyroidmegaly Lungs: Clear to auscultation, no rhonchi or wheezes, or rib retractions  Heart: Regular rate and rhythm, no murmurs or gallops Breast:Examined in sitting and supine position were symmetrical in appearance, no palpable masses or tenderness,  no skin retraction, no nipple inversion, no nipple discharge, no skin discoloration, no axillary or supraclavicular lymphadenopathy Abdomen: no palpable masses or tenderness, no rebound or guarding Extremities: no edema or skin discoloration or tenderness  Pelvic:  Bartholin, Urethra, Skene Glands: Within normal limits             Vagina: No gross lesions or discharge  Cervix: No gross lesions or discharge, IUD string seen  Uterus  anteverted, normal size, shape and consistency, non-tender and mobile  Adnexa  Without masses or tenderness  Anus and perineum  normal   Rectovaginal  normal sphincter tone without palpated masses or tenderness  Hemoccult not indicated   Vaginal wet prep negative  Assessment/Plan:  42 y.o. female for annual exam doing well after having had a Mirena IUD placed in 2013. Vaginal wet prep was negative. Occasional mucus secretion may be attributed to the IUD otherwise she is doing well. She will continue to follow with her primary physician and cardiologist for blood work and followup. She was reminded to do the monthly self breast  examination. We discussed importance of regular exercise as was calcium and vitamin D for osteoporosis prevention. No Pap smear done today. The new Pap smear screening guidelines were discussed.    Terrance Mass MD, 12:07 PM 07/25/2012

## 2012-08-01 ENCOUNTER — Other Ambulatory Visit: Payer: Self-pay | Admitting: Internal Medicine

## 2012-08-12 ENCOUNTER — Ambulatory Visit (INDEPENDENT_AMBULATORY_CARE_PROVIDER_SITE_OTHER): Payer: PRIVATE HEALTH INSURANCE | Admitting: Internal Medicine

## 2012-08-12 ENCOUNTER — Encounter: Payer: Self-pay | Admitting: Internal Medicine

## 2012-08-12 ENCOUNTER — Other Ambulatory Visit (INDEPENDENT_AMBULATORY_CARE_PROVIDER_SITE_OTHER): Payer: PRIVATE HEALTH INSURANCE

## 2012-08-12 VITALS — BP 110/68 | HR 87 | Temp 98.2°F | Ht 65.5 in | Wt 249.0 lb

## 2012-08-12 DIAGNOSIS — E119 Type 2 diabetes mellitus without complications: Secondary | ICD-10-CM

## 2012-08-12 DIAGNOSIS — E785 Hyperlipidemia, unspecified: Secondary | ICD-10-CM

## 2012-08-12 DIAGNOSIS — F3289 Other specified depressive episodes: Secondary | ICD-10-CM

## 2012-08-12 DIAGNOSIS — I428 Other cardiomyopathies: Secondary | ICD-10-CM

## 2012-08-12 DIAGNOSIS — Z0000 Encounter for general adult medical examination without abnormal findings: Secondary | ICD-10-CM

## 2012-08-12 DIAGNOSIS — F329 Major depressive disorder, single episode, unspecified: Secondary | ICD-10-CM

## 2012-08-12 LAB — HEPATIC FUNCTION PANEL
Bilirubin, Direct: 0.1 mg/dL (ref 0.0–0.3)
Total Bilirubin: 0.9 mg/dL (ref 0.3–1.2)

## 2012-08-12 LAB — BASIC METABOLIC PANEL
BUN: 14 mg/dL (ref 6–23)
CO2: 28 mEq/L (ref 19–32)
Chloride: 99 mEq/L (ref 96–112)
Glucose, Bld: 152 mg/dL — ABNORMAL HIGH (ref 70–99)
Potassium: 3.9 mEq/L (ref 3.5–5.1)
Sodium: 137 mEq/L (ref 135–145)

## 2012-08-12 LAB — LIPID PANEL
HDL: 31.5 mg/dL — ABNORMAL LOW (ref >39.00)
Total CHOL/HDL Ratio: 5
Triglycerides: 289 mg/dL — ABNORMAL HIGH (ref 0.0–149.0)
VLDL: 57.8 mg/dL — ABNORMAL HIGH (ref 0.0–40.0)

## 2012-08-12 LAB — MICROALBUMIN / CREATININE URINE RATIO
Microalb Creat Ratio: 2.5 mg/g (ref 0.0–30.0)
Microalb, Ur: 0.5 mg/dL (ref 0.0–1.9)

## 2012-08-12 MED ORDER — FLUOXETINE HCL 40 MG PO CAPS: 40.0000 mg | ORAL_CAPSULE | Freq: Every day | ORAL | Status: DC

## 2012-08-12 NOTE — Assessment & Plan Note (Signed)
Follows with psyc for same Mood stable - continue medications

## 2012-08-12 NOTE — Assessment & Plan Note (Signed)
Diet controlled - on ACEI, statin Check a1c and adjust as needed Lab Results  Component Value Date   HGBA1C 6.3 07/06/2011

## 2012-08-12 NOTE — Patient Instructions (Signed)
It was good to see you today. Health Maintenance reviewed - all recommended immunizations and age-appropriate screenings are up-to-date. Test(s) ordered today. Your results will be released to [facility] (or called to you) after review, usually within 72hours after test completion. If any changes need to be made, you will be notified at that same time. Medications reviewed, no changes at this time. Please schedule followup in 12 months for annual medical visit and labs, call sooner if problems. Work on lifestyle changes as discussed (low fat, low carb, increased protein diet; improved exercise efforts; weight loss) to control sugar, blood pressure and cholesterol levels and/or reduce risk of developing other medical problems. Look into http://vang.[email or web address] or other type of food journal to assist you in this process.  Health Maintenance, Females A healthy lifestyle and preventative care can promote health and wellness.  Maintain regular health, dental, and eye exams.  Eat a healthy diet. Foods like vegetables, fruits, whole grains, low-fat dairy products, and lean protein foods contain the nutrients you need without too many calories. Decrease your intake of foods high in solid fats, added sugars, and salt. Get information about a proper diet from your caregiver, if necessary.  Regular physical exercise is one of the most important things you can do for your health. Most adults should get at least 150 minutes of moderate-intensity exercise (any activity that increases your heart rate and causes you to sweat) each week. In addition, most adults need muscle-strengthening exercises on 2 or more days a week.   Maintain a healthy weight. The body mass index (BMI) is a screening tool to identify possible weight problems. It provides an estimate of body fat based on height and weight. Your caregiver can help determine your BMI, and can help you achieve or maintain a healthy weight. For adults 20 years and  older:  A BMI below 18.5 is considered underweight.  A BMI of 18.5 to 24.9 is normal.  A BMI of 25 to 29.9 is considered overweight.  A BMI of 30 and above is considered obese.  Maintain normal blood lipids and cholesterol by exercising and minimizing your intake of saturated fat. Eat a balanced diet with plenty of fruits and vegetables. Blood tests for lipids and cholesterol should begin at age 103 and be repeated every 5 years. If your lipid or cholesterol levels are high, you are over 50, or you are a high risk for heart disease, you may need your cholesterol levels checked more frequently.Ongoing high lipid and cholesterol levels should be treated with medicines if diet and exercise are not effective.  If you smoke, find out from your caregiver how to quit. If you do not use tobacco, do not start.  If you are pregnant, do not drink alcohol. If you are breastfeeding, be very cautious about drinking alcohol. If you are not pregnant and choose to drink alcohol, do not exceed 1 drink per day. One drink is considered to be 12 ounces (355 mL) of beer, 5 ounces (148 mL) of wine, or 1.5 ounces (44 mL) of liquor.  Avoid use of street drugs. Do not share needles with anyone. Ask for help if you need support or instructions about stopping the use of drugs.  High blood pressure causes heart disease and increases the risk of stroke. Blood pressure should be checked at least every 1 to 2 years. Ongoing high blood pressure should be treated with medicines, if weight loss and exercise are not effective.  If you are 55 to 41 years  old, ask your caregiver if you should take aspirin to prevent strokes.  Diabetes screening involves taking a blood sample to check your fasting blood sugar level. This should be done once every 3 years, after age 74, if you are within normal weight and without risk factors for diabetes. Testing should be considered at a younger age or be carried out more frequently if you are  overweight and have at least 1 risk factor for diabetes.  Breast cancer screening is essential preventative care for women. You should practice "breast self-awareness." This means understanding the normal appearance and feel of your breasts and may include breast self-examination. Any changes detected, no matter how small, should be reported to a caregiver. Women in their 62s and 30s should have a clinical breast exam (CBE) by a caregiver as part of a regular health exam every 1 to 3 years. After age 33, women should have a CBE every year. Starting at age 48, women should consider having a mammogram (breast X-ray) every year. Women who have a family history of breast cancer should talk to their caregiver about genetic screening. Women at a high risk of breast cancer should talk to their caregiver about having an MRI and a mammogram every year.  The Pap test is a screening test for cervical cancer. Women should have a Pap test starting at age 85. Between ages 27 and 68, Pap tests should be repeated every 2 years. Beginning at age 28, you should have a Pap test every 3 years as long as the past 3 Pap tests have been normal. If you had a hysterectomy for a problem that was not cancer or a condition that could lead to cancer, then you no longer need Pap tests. If you are between ages 47 and 83, and you have had normal Pap tests going back 10 years, you no longer need Pap tests. If you have had past treatment for cervical cancer or a condition that could lead to cancer, you need Pap tests and screening for cancer for at least 20 years after your treatment. If Pap tests have been discontinued, risk factors (such as a new sexual partner) need to be reassessed to determine if screening should be resumed. Some women have medical problems that increase the chance of getting cervical cancer. In these cases, your caregiver may recommend more frequent screening and Pap tests.  The human papillomavirus (HPV) test is an  additional test that may be used for cervical cancer screening. The HPV test looks for the virus that can cause the cell changes on the cervix. The cells collected during the Pap test can be tested for HPV. The HPV test could be used to screen women aged 68 years and older, and should be used in women of any age who have unclear Pap test results. After the age of 44, women should have HPV testing at the same frequency as a Pap test.  Colorectal cancer can be detected and often prevented. Most routine colorectal cancer screening begins at the age of 54 and continues through age 12. However, your caregiver may recommend screening at an earlier age if you have risk factors for colon cancer. On a yearly basis, your caregiver may provide home test kits to check for hidden blood in the stool. Use of a small camera at the end of a tube, to directly examine the colon (sigmoidoscopy or colonoscopy), can detect the earliest forms of colorectal cancer. Talk to your caregiver about this at age 58, when  routine screening begins. Direct examination of the colon should be repeated every 5 to 10 years through age 7, unless early forms of pre-cancerous polyps or small growths are found.  Hepatitis C blood testing is recommended for all people born from [birth year] through 1964 and any individual with known risks for hepatitis C.  Practice safe sex. Use condoms and avoid high-risk sexual practices to reduce the spread of sexually transmitted infections (STIs). Sexually active women aged 69 and younger should be checked for Chlamydia, which is a common sexually transmitted infection. Older women with new or multiple partners should also be tested for Chlamydia. Testing for other STIs is recommended if you are sexually active and at increased risk.  Osteoporosis is a disease in which the bones lose minerals and strength with aging. This can result in serious bone fractures. The risk of osteoporosis can be identified using a bone  density scan. Women ages 54 and over and women at risk for fractures or osteoporosis should discuss screening with their caregivers. Ask your caregiver whether you should be taking a calcium supplement or vitamin D to reduce the rate of osteoporosis.  Menopause can be associated with physical symptoms and risks. Hormone replacement therapy is available to decrease symptoms and risks. You should talk to your caregiver about whether hormone replacement therapy is right for you.  Use sunscreen with a sun protection factor (SPF) of 30 or greater. Apply sunscreen liberally and repeatedly throughout the day. You should seek shade when your shadow is shorter than you. Protect yourself by wearing long sleeves, pants, a wide-brimmed hat, and sunglasses year round, whenever you are outdoors.  Notify your caregiver of new moles or changes in moles, especially if there is a change in shape or color. Also notify your caregiver if a mole is larger than the size of a pencil eraser.  Stay current with your immunizations. Document Released: 11/21/2010 Document Revised: 07/31/2011 Document Reviewed: 11/21/2010 [facility] Patient Information 2013 [place]. Exercise to Lose Weight Exercise and a healthy diet may help you lose weight. Your doctor may suggest specific exercises. EXERCISE IDEAS AND TIPS  Choose low-cost things you enjoy doing, such as walking, bicycling, or exercising to workout videos.  Take stairs instead of the elevator.  Walk during your lunch break.  Park your car further away from work or school.  Go to a gym or an exercise class.  Start with 5 to 10 minutes of exercise each day. Build up to 30 minutes of exercise 4 to 6 days a week.  Wear shoes with good support and comfortable clothes.  Stretch before and after working out.  Work out until you breathe harder and your heart beats faster.  Drink extra water when you exercise.  Do not do so much that you hurt yourself, feel  dizzy, or get very short of breath. Exercises that burn about 150 calories:  Running 1  miles in 15 minutes.  Playing volleyball for 45 to 60 minutes.  Washing and waxing a car for 45 to 60 minutes.  Playing touch football for 45 minutes.  Walking 1  miles in 35 minutes.  Pushing a stroller 1  miles in 30 minutes.  Playing basketball for 30 minutes.  Raking leaves for 30 minutes.  Bicycling 5 miles in 30 minutes.  Walking 2 miles in 30 minutes.  Dancing for 30 minutes.  Shoveling snow for 15 minutes.  Swimming laps for 20 minutes.  Walking up stairs for 15 minutes.  Bicycling 4 miles  in 15 minutes.  Gardening for 30 to 45 minutes.  Jumping rope for 15 minutes.  Washing windows or floors for 45 to 60 minutes. Document Released: 06/10/2010 Document Revised: 07/31/2011 Document Reviewed: 06/10/2010 [facility] Patient Information 2013 [place].

## 2012-08-12 NOTE — Assessment & Plan Note (Signed)
Started simvastatin 09/2009 - tolerating well Check lipids/LFT now and adjust as needed

## 2012-08-12 NOTE — Assessment & Plan Note (Signed)
hosp 07/10/11 due to ARI and hyperkalemia in setting of hypotension and syncope Stable since that time- clinically euvolemic Recheck labs now follow up cards as planned

## 2012-08-12 NOTE — Progress Notes (Signed)
Subjective:    Patient ID: Carolyn Salazar, female    DOB: 1970-12-11, 42 y.o.   MRN: [id number]  HPI  Here for medicare wellness  Diet: heart healthy or DM if diabetic Physical activity: sedentary Depression/mood screen: negative Hearing: intact to whispered voice Visual acuity: grossly normal, performs annual eye exam  ADLs: capable Fall risk: none Home safety: good Cognitive evaluation: intact to orientation, naming, recall and repetition EOL planning: adv directives, full code/ I agree  I have personally reviewed and have noted 1. The patient's medical and social history 2. Their use of alcohol, tobacco or illicit drugs 3. Their current medications and supplements 4. The patient's functional ability including ADL's, fall risks, home safety risks and hearing or visual impairment. 5. Diet and physical activities 6. Evidence for depression or mood disorders  also reviewed chronic medical issues  dyslipidemia - hx high TG 04/2008 - tries to follow low fat diet - started on statin 09/2009 for same - reports compliance with ongoing medical treatment and no changes in medication dose or frequency. denies adverse side effects related to current therapy.   arthritis - pain is worse with cold weather and activity - pain affects bilateral MCP and bilateral knees R>L - no trauma or injury recalled, not assoc with any swelling- +FH both RA (mom) and OA (g-mom) - pain improved with "gloves" while knitting to keep hands warm and occ tylenol use   DM2, diet controlled, hx metabolic syndrome. +FH DM - ongoing weight loss efforts (intermittent), ongoing exercise - no PU or PD - does not check home cbgs regularly - fasting always <120   CM hx 02/1999 - follows with cards for same - no edema or shortness of breath - no recent need for med changes   depression - follows with psyc provider for same - reports compliance with ongoing medical treatment and no changes in medication dose or frequency.  denies adverse side effects related to current therapy. no si or sadness   Past Medical History  Diagnosis Date  . BUNIONS, BILATERAL   . CARDIOMYOPATHY 02/1999    EF 20% in 02/1999, improved over time. Echo 01/2006 - EF 50-55% EF 04/2008 35-40% EF 40-45% Echo 07/22/2009  . MIGRAINE HEADACHE   . DEPRESSION   . OBESITY   . 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 DYSLIPIDEMIA   . DIABETES MELLITUS, TYPE II, CONTROLLED, MILD   . BENIGN NEOPLASM OF SKIN SITE UNSPECIFIED   . Arthritis     Hands, Knees RT>LT  . Chest pain     Nuclear, 2007, normal ( catheterization in 2000, normal coronary arteries)  . Sinus tachycardia   . Hypotension     November, 2012  . Shingles (herpes zoster) polyneuropathy    Family History  Problem Relation Age of Onset  . Diabetes Mother   . Cancer Mother     Ovarian, benign mass  . Diabetes type II Mother   . Diabetes Father   . Heart failure Father   . Heart attack Father   . Hypertension Other     Parent  . Hyperlipidemia Other     parent  . Arthritis Other     parent, grandparent  . Arthritis Sister    History  Substance Use Topics  . Smoking status: Never Smoker   . Smokeless tobacco: Never Used  . Alcohol Use: 0.6 oz/week    1 Shots of liquor per week     Comment: Rare  Review of Systems  Constitutional: Negative for unexpected weight change.  Respiratory: Negative for cough and shortness of breath.   Cardiovascular: Negative for chest pain and palpitations.  No other specific complaints in a complete review of systems (except as listed in HPI above).      Objective:   Physical Exam  BP 110/68  Pulse 87  Temp(Src) 98.2 F (36.8 C) (Oral)  Ht 5' 5.5" (1.664 m)  Wt 249 lb (112.946 kg)  BMI 40.79 kg/m2  SpO2 96% Wt Readings from Last 3 Encounters:  08/12/12 249 lb (112.946 kg)  07/25/12 250 lb (113.399 kg)  08/01/11 246 lb (111.585 kg)   Constitutional: She is overweight. She appears  well-developed and well-nourished. No distress.   Neck: Normal range of motion. Neck supple. No JVD present. No thyromegaly present.  Cardiovascular: Normal rate, regular rhythm and normal heart sounds.  No murmur heard. No BLE edema. Pulmonary/Chest: Effort normal and breath sounds normal. No respiratory distress. She has no wheezes.  Psychiatric: She has a normal mood and affect. Her behavior is normal. Judgment and thought content normal.   Lab Results  Component Value Date   WBC 9.6 09/07/2011   HGB 13.2 09/07/2011   HCT 42.9 09/07/2011   PLT 354 09/07/2011   CHOL 153 01/31/2011   TRIG 257.0* 01/31/2011   HDL 41.00 01/31/2011   LDLDIRECT 87.1 01/31/2011   ALT 13 07/09/2011   AST 13 07/09/2011   NA 137 08/10/2011   K 4.2 08/10/2011   CL 103 08/10/2011   CREATININE 0.7 08/10/2011   BUN 12 08/10/2011   CO2 26 08/10/2011   TSH 4.91 07/06/2011   HGBA1C 6.3 07/06/2011   ECG: sinus @ 77bpm - occ PAC - TWI anterior - unchanged from 08/01/11    Assessment & Plan:   CPX/AWV/v70.0 - Today patient counseled on age appropriate routine health concerns for screening and prevention, each reviewed and up to date or declined. Immunizations reviewed and up to date or declined. Labs/ECG reviewed. Risk factors for depression reviewed and negative. Hearing function and visual acuity are intact. ADLs screened and addressed as needed. Functional ability and level of safety reviewed and appropriate. Education, counseling and referrals performed based on assessed risks today. Patient provided with a copy of personalized plan for preventive services.  Also see problem list. Medications and labs reviewed today.

## 2012-08-13 ENCOUNTER — Other Ambulatory Visit: Payer: Self-pay | Admitting: Internal Medicine

## 2012-08-17 ENCOUNTER — Encounter: Payer: Self-pay | Admitting: Cardiology

## 2012-08-17 DIAGNOSIS — R9430 Abnormal result of cardiovascular function study, unspecified: Secondary | ICD-10-CM | POA: Insufficient documentation

## 2012-08-17 DIAGNOSIS — IMO0002 Reserved for concepts with insufficient information to code with codable children: Secondary | ICD-10-CM | POA: Insufficient documentation

## 2012-08-19 ENCOUNTER — Encounter: Payer: Self-pay | Admitting: Cardiology

## 2012-08-19 ENCOUNTER — Ambulatory Visit (INDEPENDENT_AMBULATORY_CARE_PROVIDER_SITE_OTHER): Payer: PRIVATE HEALTH INSURANCE | Admitting: Cardiology

## 2012-08-19 VITALS — BP 112/72 | HR 78 | Ht 66.0 in | Wt 246.8 lb

## 2012-08-19 DIAGNOSIS — I959 Hypotension, unspecified: Secondary | ICD-10-CM

## 2012-08-19 DIAGNOSIS — R079 Chest pain, unspecified: Secondary | ICD-10-CM

## 2012-08-19 DIAGNOSIS — I5022 Chronic systolic (congestive) heart failure: Secondary | ICD-10-CM

## 2012-08-19 DIAGNOSIS — I428 Other cardiomyopathies: Secondary | ICD-10-CM

## 2012-08-19 MED ORDER — FUROSEMIDE 40 MG PO TABS: 40.0000 mg | ORAL_TABLET | Freq: Every day | ORAL | Status: DC

## 2012-08-19 NOTE — Progress Notes (Signed)
HPI  Patient is seen for cardiology followup. Historically she's had a cardiomyopathy that has improved over time. I saw her last December, 2012. At that time she was stable. She had had an echo at that time with an EF of 40-45%. The patient was then seen in the office February, 2013 after hospitalization. She had acute renal failure at that time. At that time it was felt that her acute renal failure was probably secondary to combination of nonsteroidal meds, cardiomyopathy, ACE inhibitor therapy. Her creatinine improved from 8 all away to 0.9. It was felt that her a should be held for at least 2-4 weeks after that time. At that time she was off Lasix ACE inhibitor and spironolactone. Lab was done August 12, 2012. Creatinine was 0.7 and potassium was normal.  Today she is here feeling well.  Allergies  Allergen Reactions  . Sulfonamide Derivatives     REACTION: sweeling    Current Outpatient Prescriptions  Medication Sig Dispense Refill  . carvedilol (COREG) 25 MG tablet TAKE 2 TABLETS BY MOUTH TWICE DAILY WITH MEALS  360 tablet  0  . cetirizine (ZYRTEC) 10 MG tablet Take 10 mg by mouth daily.        . digoxin (LANOXIN) 0.125 MG tablet Take 1 tablet (0.125 mg total) by mouth daily.  90 tablet  1  . FLUoxetine (PROZAC) 40 MG capsule Take 1 capsule (40 mg total) by mouth daily.  90 capsule  3  . furosemide (LASIX) 40 MG tablet Take 1 tablet (40 mg total) by mouth daily.      . Glucosamine-Chondroitin (GLUCOSAMINE CHONDR COMPLEX) 500-400 MG CAPS Take by mouth daily.      . IRON PO Take 45 mg by mouth daily.       Marland Kitchen lamoTRIgine (LAMICTAL) 200 MG tablet Take 400 mg by mouth daily.       Marland Kitchen lisinopril (PRINIVIL,ZESTRIL) 2.5 MG tablet TAKE 1 TABLET BY MOUTH EVERY DAY  90 tablet  0  . multivitamin-iron-minerals-folic acid (CENTRUM) chewable tablet Chew 2 tablets by mouth daily.      Marland Kitchen omeprazole (PRILOSEC) 20 MG capsule Take 20 mg by mouth daily.      . potassium chloride SA (K-DUR,KLOR-CON) 20 MEQ  tablet TAKE 1 TABLET BY MOUTH EVERY DAY  90 tablet  4  . simvastatin (ZOCOR) 20 MG tablet TAKE 1 TABLET BY MOUTH AT BEDTIME  90 tablet  3  . zolpidem (AMBIEN) 10 MG tablet Take 10 mg by mouth at bedtime as needed.       Current Facility-Administered Medications  Medication Dose Route Frequency Provider Last Rate Last Dose  . levonorgestrel (MIRENA) 20 MCG/24HR IUD   Intrauterine Once Terrance Mass, MD        History   Social History  . Marital Status: Married    Spouse Name: N/A    Number of Children: N/A  . Years of Education: N/A   Occupational History  . Not on file.   Social History Main Topics  . Smoking status: Never Smoker   . Smokeless tobacco: Never Used  . Alcohol Use: 0.6 oz/week    1 Shots of liquor per week     Comment: Rare  . Drug Use: No  . Sexually Active: Yes    Birth Control/ Protection: Other-see comments     Comment: TUBAL LIGATION   Other Topics Concern  . Not on file   Social History Narrative   Pt lives with his spouse, daughter and  college friend.     Family History  Problem Relation Age of Onset  . Diabetes Mother   . Cancer Mother     Ovarian, benign mass  . Diabetes type II Mother   . Diabetes Father   . Heart failure Father   . Heart attack Father   . Hypertension Other     Parent  . Hyperlipidemia Other     parent  . Arthritis Other     parent, grandparent  . Arthritis Sister     Past Medical History  Diagnosis Date  . BUNIONS, BILATERAL   . CARDIOMYOPATHY 02/1999    EF 20% in 02/1999, improved over time. Echo 01/2006 - EF 50-55% EF 04/2008 35-40% EF 40-45% Echo 07/22/2009  . MIGRAINE HEADACHE   . DEPRESSION   . OBESITY   . 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 DYSLIPIDEMIA   . DIABETES MELLITUS, TYPE II, CONTROLLED, MILD   . BENIGN NEOPLASM OF SKIN SITE UNSPECIFIED   . Arthritis     Hands, Knees RT>LT  . Chest pain     Nuclear, 2007, normal ( catheterization in 2000, normal  coronary arteries)  . Sinus tachycardia   . Hypotension     November, 2012  . Shingles (herpes zoster) polyneuropathy   . Ejection fraction     EF 20%, echo, 2000  //   EF 50%, echo, 2007  //   EF 35-40%, echo, 2009  //   EF 40-45%, echo, 2011  //   EF 40-45%, echo, December, 2012    Past Surgical History  Procedure Laterality Date  . Cardiac catheterization    . Heart biopsy    . Vein scope    . Bunionectomy  2012    LEFT    Patient Active Problem List  Diagnosis  . BENIGN NEOPLASM OF SKIN SITE UNSPECIFIED  . DIABETES MELLITUS, TYPE II, CONTROLLED, MILD  . DYSLIPIDEMIA  . METABOLIC SYNDROME X  . OBESITY  . DEPRESSION  . MIGRAINE HEADACHE  . ARTHRITIS  . CHICKENPOX, HX OF  . HEARING LOSS  . BUNIONS, BILATERAL  . CARDIOMYOPATHY  . Chest pain  . Hypotension  . Hyperkalemia  . Chronic systolic heart failure  . Anemia  . Ejection fraction    ROS   Patient denies fever, chills, headache, sweats, rash, change in vision, change in hearing, chest pain, cough, nausea vomiting, urinary symptoms. All other systems are reviewed and are negative.  PHYSICAL EXAM  Patient is overweight but stable. There is no jugular venous distention. Lungs are clear. Respiratory effort is nonlabored. Cardiac exam revealed S1 and S2. There no clicks or significant murmurs. The abdomen is soft. Is no peripheral edema. There no musculoskeletal deformities. There are no skin rashes.  Filed Vitals:   08/19/12 1201  BP: 112/72  Pulse: 78  Height: 5' 6"  (1.676 m)  Weight: 246 lb 12.8 oz (111.948 kg)   I reviewed the EKG from August 12, 2012. There are nonspecific ST-T wave changes. There is sinus rhythm.  ASSESSMENT & PLAN

## 2012-08-19 NOTE — Assessment & Plan Note (Signed)
Blood pressure stable at this time. She had hypotension when she had her renal failure in November, 2012.

## 2012-08-19 NOTE — Patient Instructions (Addendum)
Your physician wants you to follow-up in: 1 year.   You will receive a reminder letter in the mail two months in advance. If you don't receive a letter, please call our office to schedule the follow-up appointment.  Your physician has requested that you have an echocardiogram. Echocardiography is a painless test that uses sound waves to create images of your heart. It provides your doctor with information about the size and shape of your heart and how well your heart's chambers and valves are working. This procedure takes approximately one hour. There are no restrictions for this procedure. ]

## 2012-08-19 NOTE — Assessment & Plan Note (Addendum)
Her volume status is stable. No change in therapy today. Two-dimensional echo will be done. I will be in touch with her with the information.If her echo is stable, I will communicate with her and plans for followup in one year as was outlined at the time of her leaving the office. If there are any new findings from echo will see her sooner.  As part of today's evaluation I spent greater than 25 minutes were overall care. More than half of this time was spent with direct contact with the patient concerning all of her medical issues.

## 2012-08-19 NOTE — Addendum Note (Signed)
Addended by: Lucile Crater D on: 08/19/2012 02:01 PM   Modules accepted: Orders

## 2012-08-19 NOTE — Assessment & Plan Note (Signed)
She's not having any chest pain. Coronaries were normal in 2000 and nuclear in 2007 showed no ischemia

## 2012-08-19 NOTE — Assessment & Plan Note (Signed)
The patient's echo was last done in December, 2012. There has not been an echo since her episode of severe renal failure in February, 2013. We will repeat her echo. Fortunately many of her medicines have been restarted and her renal function is remaining stable. Creatinine done 1 week ago was 0.7. I decided not to change any of her medicines today. It is not totally clear to me exactly why she had renal failure in February, 2013. He was multifactorial. However we need to be very careful with the readmission of any other medications. This includes thought process concerning spironolactone.

## 2012-08-20 LAB — HM PAP SMEAR

## 2012-08-26 ENCOUNTER — Ambulatory Visit (HOSPITAL_COMMUNITY): Payer: PRIVATE HEALTH INSURANCE | Attending: Cardiology

## 2012-08-26 DIAGNOSIS — I428 Other cardiomyopathies: Secondary | ICD-10-CM | POA: Insufficient documentation

## 2012-08-26 NOTE — Progress Notes (Signed)
Echocardiogram performed.  

## 2012-09-07 ENCOUNTER — Encounter: Payer: Self-pay | Admitting: Cardiology

## 2012-09-11 ENCOUNTER — Telehealth: Payer: Self-pay | Admitting: Cardiology

## 2012-09-11 NOTE — Telephone Encounter (Signed)
New problem ° ° °Pt returning your call. °

## 2012-09-11 NOTE — Telephone Encounter (Signed)
Pt given results, she verbalized understanding.

## 2012-10-02 ENCOUNTER — Other Ambulatory Visit: Payer: Self-pay | Admitting: Internal Medicine

## 2012-10-20 ENCOUNTER — Other Ambulatory Visit: Payer: Self-pay | Admitting: Internal Medicine

## 2012-10-24 ENCOUNTER — Other Ambulatory Visit: Payer: Self-pay | Admitting: Internal Medicine

## 2012-12-02 ENCOUNTER — Other Ambulatory Visit: Payer: Self-pay | Admitting: Cardiology

## 2013-03-27 ENCOUNTER — Other Ambulatory Visit: Payer: Self-pay

## 2013-04-08 ENCOUNTER — Other Ambulatory Visit: Payer: Self-pay

## 2013-04-08 MED ORDER — POTASSIUM CHLORIDE CRYS ER 20 MEQ PO TBCR: EXTENDED_RELEASE_TABLET | ORAL | Status: DC

## 2013-04-25 ENCOUNTER — Other Ambulatory Visit: Payer: Self-pay

## 2013-05-27 ENCOUNTER — Telehealth: Payer: Self-pay | Admitting: *Deleted

## 2013-05-27 DIAGNOSIS — R5383 Other fatigue: Principal | ICD-10-CM

## 2013-05-27 DIAGNOSIS — R5381 Other malaise: Secondary | ICD-10-CM

## 2013-05-27 NOTE — Telephone Encounter (Signed)
Lab order entered - will post results to MyChart after review

## 2013-05-27 NOTE — Telephone Encounter (Signed)
Patient phoned requesting iron labs to be drawn, she is having temperature intolerance issues.  Please advise.  CB# [phone]

## 2013-05-28 ENCOUNTER — Other Ambulatory Visit (INDEPENDENT_AMBULATORY_CARE_PROVIDER_SITE_OTHER): Payer: PRIVATE HEALTH INSURANCE

## 2013-05-28 DIAGNOSIS — R5381 Other malaise: Secondary | ICD-10-CM

## 2013-05-28 DIAGNOSIS — R5383 Other fatigue: Principal | ICD-10-CM

## 2013-05-28 LAB — CBC WITH DIFFERENTIAL/PLATELET
BASOS PCT: 0.3 % (ref 0.0–3.0)
Basophils Absolute: 0 10*3/uL (ref 0.0–0.1)
EOS PCT: 1.2 % (ref 0.0–5.0)
Eosinophils Absolute: 0.1 10*3/uL (ref 0.0–0.7)
HCT: 39.7 % (ref 36.0–46.0)
Hemoglobin: 13.8 g/dL (ref 12.0–15.0)
LYMPHS PCT: 33.1 % (ref 12.0–46.0)
Lymphs Abs: 3.7 10*3/uL (ref 0.7–4.0)
MCHC: 34.6 g/dL (ref 30.0–36.0)
MCV: 89.4 fl (ref 78.0–100.0)
MONOS PCT: 6.6 % (ref 3.0–12.0)
Monocytes Absolute: 0.7 10*3/uL (ref 0.1–1.0)
NEUTROS PCT: 58.8 % (ref 43.0–77.0)
Neutro Abs: 6.6 10*3/uL (ref 1.4–7.7)
Platelets: 348 10*3/uL (ref 150.0–400.0)
RBC: 4.45 Mil/uL (ref 3.87–5.11)
RDW: 13.2 % (ref 11.5–14.6)
WBC: 11.2 10*3/uL — AB (ref 4.5–10.5)

## 2013-05-28 LAB — FERRITIN: Ferritin: 132.4 ng/mL (ref 10.0–291.0)

## 2013-05-28 NOTE — Telephone Encounter (Signed)
Patient called back, informed her of MD lab order, patient states she will go to the lab today & get it completed.

## 2013-05-28 NOTE — Telephone Encounter (Signed)
[facility] for patient on voicemail.

## 2013-05-29 ENCOUNTER — Encounter: Payer: Self-pay | Admitting: Internal Medicine

## 2013-07-31 ENCOUNTER — Other Ambulatory Visit: Payer: Self-pay | Admitting: Internal Medicine

## 2013-08-01 ENCOUNTER — Encounter: Payer: Self-pay | Admitting: Gynecology

## 2013-08-01 ENCOUNTER — Telehealth: Payer: Self-pay | Admitting: *Deleted

## 2013-08-01 ENCOUNTER — Ambulatory Visit (INDEPENDENT_AMBULATORY_CARE_PROVIDER_SITE_OTHER): Payer: No Typology Code available for payment source | Admitting: Gynecology

## 2013-08-01 VITALS — BP 128/82 | Ht 66.25 in | Wt 241.0 lb

## 2013-08-01 DIAGNOSIS — Z01419 Encounter for gynecological examination (general) (routine) without abnormal findings: Secondary | ICD-10-CM

## 2013-08-01 DIAGNOSIS — L293 Anogenital pruritus, unspecified: Secondary | ICD-10-CM

## 2013-08-01 DIAGNOSIS — L292 Pruritus vulvae: Secondary | ICD-10-CM

## 2013-08-01 LAB — WET PREP FOR TRICH, YEAST, CLUE
CLUE CELLS WET PREP: NONE SEEN
Trich, Wet Prep: NONE SEEN
WBC, Wet Prep HPF POC: NONE SEEN
YEAST WET PREP: NONE SEEN

## 2013-08-01 MED ORDER — FLUCONAZOLE 150 MG PO TABS: ORAL_TABLET | ORAL | Status: DC

## 2013-08-01 MED ORDER — CLOBETASOL PROPIONATE 0.05 % EX CREA: TOPICAL_CREAM | CUTANEOUS | Status: DC

## 2013-08-01 NOTE — Telephone Encounter (Signed)
Pharmacy called for direction for diflucan 150 every other day was correct. I called pharmacist back to confirm this is correct.

## 2013-08-01 NOTE — Progress Notes (Signed)
Carolyn Salazar 17-Oct-1970 [id number]   History:    43 y.o.  for annual gyn exam with the only complaint being of a vulvar pruritus. Patient stated that she used Mycolog and over-the-counter corticosteroid with no resolution. She denies any vaginal discharge. She is in a monogamous relationship. She does have a Mirena IUD that was placed in 2013. Her primary physician is Dr. Ripley Fraise who is been following her for metabolic syndrome. Patient scheduled to see her next week and will be obtaining her lab work at that time. She also has been seen in the past by Dr. Ron Parker for nonischemic cardiomyopathy. She has an appointment with her cardiologist in the next few weeks as well. Patient states her cycles are spotting for a few days month. Patient with no past history of abnormal Pap smears.  Past medical history,surgical history, family history and social history were all reviewed and documented in the EPIC chart.  Gynecologic History No LMP recorded. Patient is not currently having periods (Reason: IUD). Contraception: IUD Last Pap: 2013. Results were: normal Last mammogram: 2014. Results were: normal  Obstetric History OB History  Gravida Para Term Preterm AB SAB TAB Ectopic Multiple Living  1 1 1       1     # Outcome Date GA Lbr Len/2nd Weight Sex Delivery Anes PTL Lv  1 TRM     F SVD  N Y       ROS: A ROS was performed and pertinent positives and negatives are included in the history.  GENERAL: No fevers or chills. HEENT: No change in vision, no earache, sore throat or sinus congestion. NECK: No pain or stiffness. CARDIOVASCULAR: No chest pain or pressure. No palpitations. PULMONARY: No shortness of breath, cough or wheeze. GASTROINTESTINAL: No abdominal pain, nausea, vomiting or diarrhea, melena or bright red blood per rectum. GENITOURINARY: No urinary frequency, urgency, hesitancy or dysuria. MUSCULOSKELETAL: No joint or muscle pain, no back pain, no recent trauma. DERMATOLOGIC:  Vulvar irritation and pruritus ENDOCRINE: No polyuria, polydipsia, no heat or cold intolerance. No recent change in weight. HEMATOLOGICAL: No anemia or easy bruising or bleeding. NEUROLOGIC: No headache, seizures, numbness, tingling or weakness. PSYCHIATRIC: No depression, no loss of interest in normal activity or change in sleep pattern.     Exam: chaperone present  BP 128/82  Ht 5' 6.25" (1.683 m)  Wt 241 lb (109.317 kg)  BMI 38.59 kg/m2  Body mass index is 38.59 kg/(m^2).  General appearance : Well developed well nourished female. No acute distress HEENT: Neck supple, trachea midline, no carotid bruits, no thyroidmegaly Lungs: Clear to auscultation, no rhonchi or wheezes, or rib retractions  Heart: Regular rate and rhythm, no murmurs or gallops Breast:Examined in sitting and supine position were symmetrical in appearance, no palpable masses or tenderness,  no skin retraction, no nipple inversion, no nipple discharge, no skin discoloration, no axillary or supraclavicular lymphadenopathy Abdomen: no palpable masses or tenderness, no rebound or guarding Extremities: no edema or skin discoloration or tenderness  Pelvic: Labia majora and inguinal crease with evidence of erythema highly suspicious for yeast  Bartholin, Urethra, Skene Glands: Within normal limits             Vagina: No gross lesions or discharge  Cervix: No gross lesions or discharge  Uterus  anteverted, normal size, shape and consistency, non-tender and mobile  Adnexa  Without masses or tenderness  Anus and perineum  normal   Rectovaginal  normal sphincter tone without palpated masses or  tenderness             Hemoccult not indicated   Wet prep negative  Assessment/Plan:  43 y.o. female for annual exam will be treated for suspected skin dermatosis attributed to yeast with Diflucan 150 mg one every other day for 3 days. She will also apply clobetasol 0.05% 2 times a day for 2 weeks then twice a week until I see her for  followup in 3 weeks. Her PCP will be doing her blood work. She is scheduled to see the cardiologist next week. She was reminded to do her monthly breast exam. We discussed importance of calcium vitamin D and regular exercise for osteoporosis prevention. Pap smear was not done today in accordance to the new guidelines. She will need a Pap smear next year. She needs to schedule her mammogram this year.  Note: This dictation was prepared with  Dragon/digital dictation along withSmart phrase technology. Any transcriptional errors that result from this process are unintentional.   Terrance Mass MD, 10:43 AM 08/01/2013

## 2013-08-05 ENCOUNTER — Other Ambulatory Visit: Payer: Self-pay

## 2013-08-05 ENCOUNTER — Telehealth: Payer: Self-pay | Admitting: *Deleted

## 2013-08-05 DIAGNOSIS — Z1231 Encounter for screening mammogram for malignant neoplasm of breast: Secondary | ICD-10-CM

## 2013-08-05 NOTE — Telephone Encounter (Signed)
Pt left message in voiceamail regarding diflucan sent on OV 08/01/13. I left message for pt to call.

## 2013-08-05 NOTE — Telephone Encounter (Signed)
Pt had question with direction with diflucan on OV, all question answered.

## 2013-08-06 ENCOUNTER — Ambulatory Visit
Admission: RE | Admit: 2013-08-06 | Discharge: 2013-08-06 | Disposition: A | Discharge status: Home / Self Care | Payer: PRIVATE HEALTH INSURANCE | Source: Ambulatory Visit

## 2013-08-06 DIAGNOSIS — Z1231 Encounter for screening mammogram for malignant neoplasm of breast: Secondary | ICD-10-CM

## 2013-08-11 ENCOUNTER — Ambulatory Visit (INDEPENDENT_AMBULATORY_CARE_PROVIDER_SITE_OTHER): Payer: Medicare Other | Admitting: Cardiology

## 2013-08-11 ENCOUNTER — Encounter: Payer: Self-pay | Admitting: Cardiology

## 2013-08-11 VITALS — BP 108/60 | HR 80 | Ht 66.25 in | Wt 243.8 lb

## 2013-08-11 DIAGNOSIS — R079 Chest pain, unspecified: Secondary | ICD-10-CM

## 2013-08-11 DIAGNOSIS — I428 Other cardiomyopathies: Secondary | ICD-10-CM

## 2013-08-11 DIAGNOSIS — I5022 Chronic systolic (congestive) heart failure: Secondary | ICD-10-CM

## 2013-08-11 NOTE — Assessment & Plan Note (Signed)
Over the years she has had significant. Patient in her LV function. Most recent echo showed good left ventricular function. She will remain on her medications. No change in therapy.

## 2013-08-11 NOTE — Progress Notes (Signed)
Patient ID: Carolyn Salazar, female   DOB: [birth year], 43 y.o.   MRN: [id number]    HPI  Patient is seen today to followup history of cardiomyopathy. I saw her last April, 2014. Fortunately she's doing well. After her last visit she had a followup 2-D echo. She has had return of good left ventricular function with an EF of 50-55%. She's not having any chest pain or shortness of breath.  Allergies  Allergen Reactions  . Sulfonamide Derivatives     REACTION: sweeling    Current Outpatient Prescriptions  Medication Sig Dispense Refill  . carvedilol (COREG) 25 MG tablet TAKE 2 TABLETS BY MOUTH TWICE DAILY WITH MEALS  360 tablet  3  . cetirizine (ZYRTEC) 10 MG tablet Take 10 mg by mouth daily.        . clobetasol cream (TEMOVATE) 0.05 % Applied twice a day for 2 weeks then twice weekly  30 g  3  . DIGOX 125 MCG tablet TAKE 1 TABLET BY MOUTH EVERY DAY  90 tablet  0  . Esomeprazole Magnesium (NEXIUM PO) Take by mouth daily.      Marland Kitchen FIBER SELECT GUMMIES PO Take 2 tablets by mouth every morning.      . fluconazole (DIFLUCAN) 150 MG tablet Take one every other day for 3 days  3 tablet  2  . FLUoxetine (PROZAC) 40 MG capsule Take 1 capsule (40 mg total) by mouth daily.  90 capsule  3  . furosemide (LASIX) 40 MG tablet Take 1 tablet (40 mg total) by mouth daily.  90 tablet  3  . Glucosamine-Chondroitin (GLUCOSAMINE CHONDR COMPLEX) 500-400 MG CAPS Take by mouth daily.      . IRON PO Take 45 mg by mouth daily.       Marland Kitchen lamoTRIgine (LAMICTAL) 200 MG tablet Take 400 mg by mouth daily.       Marland Kitchen lisinopril (PRINIVIL,ZESTRIL) 2.5 MG tablet TAKE 1 TABLET BY MOUTH EVERY DAY  90 tablet  3  . multivitamin-iron-minerals-folic acid (CENTRUM) chewable tablet Chew 2 tablets by mouth daily.      . potassium chloride SA (K-DUR,KLOR-CON) 20 MEQ tablet TAKE 1 TABLET BY MOUTH EVERY DAY  90 tablet  4  . simvastatin (ZOCOR) 20 MG tablet TAKE 1 TABLET BY MOUTH AT BEDTIME  90 tablet  3  . zolpidem (AMBIEN) 10 MG tablet Take  10 mg by mouth at bedtime as needed.       Current Facility-Administered Medications  Medication Dose Route Frequency Provider Last Rate Last Dose  . levonorgestrel (MIRENA) 20 MCG/24HR IUD   Intrauterine Once Terrance Mass, MD        History   Social History  . Marital Status: Married    Spouse Name: N/A    Number of Children: N/A  . Years of Education: N/A   Occupational History  . Not on file.   Social History Main Topics  . Smoking status: Never Smoker   . Smokeless tobacco: Never Used  . Alcohol Use: 0.6 oz/week    1 Shots of liquor per week     Comment: Rare  . Drug Use: No  . Sexual Activity: Yes    Birth Control/ Protection: Other-see comments     Comment: TUBAL LIGATION   Other Topics Concern  . Not on file   Social History Narrative   Pt lives with his spouse, daughter and college friend.     Family History  Problem Relation Age of Onset  . Diabetes  Mother   . Cancer Mother     Ovarian, benign mass  . Diabetes type II Mother   . Diabetes Father   . Heart failure Father   . Heart attack Father   . Hypertension Other     Parent  . Hyperlipidemia Other     parent  . Arthritis Other     parent, grandparent  . Arthritis Sister     Past Medical History  Diagnosis Date  . BUNIONS, BILATERAL   . CARDIOMYOPATHY 02/1999    EF 20% in 02/1999, improved over time. Echo 01/2006 - EF 50-55% EF 04/2008 35-40% EF 40-45% Echo 07/22/2009  . MIGRAINE HEADACHE   . DEPRESSION   . OBESITY   . 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 DYSLIPIDEMIA   . DIABETES MELLITUS, TYPE II, CONTROLLED, MILD   . BENIGN NEOPLASM OF SKIN SITE UNSPECIFIED   . Arthritis     Hands, Knees RT>LT  . Chest pain     Nuclear, 2007, normal ( catheterization in 2000, normal coronary arteries)  . Sinus tachycardia   . Hypotension     November, 2012  . Shingles (herpes zoster) polyneuropathy   . Ejection fraction     EF 20%, echo, 2000  //   EF 50%,  echo, 2007  //   EF 35-40%, echo, 2009  //   EF 40-45%, echo, 2011  //   EF 40-45%, echo, December, 2012    Past Surgical History  Procedure Laterality Date  . Cardiac catheterization    . Heart biopsy    . Vein scope    . Bunionectomy  2012    LEFT    Patient Active Problem List   Diagnosis Date Noted  . Hypotension     Priority: High  . Chest pain     Priority: High  . CARDIOMYOPATHY 02/20/1999    Priority: High  . Ejection fraction   . Anemia 09/07/2011  . Chronic systolic heart failure 24/26/8341  . Hyperkalemia 07/08/2011  . HEARING LOSS 08/02/2010  . BUNIONS, BILATERAL 08/02/2010  . BENIGN NEOPLASM OF SKIN SITE UNSPECIFIED 01/10/2010  . DIABETES MELLITUS, TYPE II, CONTROLLED, MILD 10/08/2009  . DYSLIPIDEMIA 10/07/2009  . METABOLIC SYNDROME X 96/22/2979  . OBESITY 10/07/2009  . DEPRESSION 10/07/2009  . MIGRAINE HEADACHE 10/07/2009  . ARTHRITIS 10/07/2009  . CHICKENPOX, HX OF 10/07/2009    ROS   Patient denies fever, chills, headache, sweats, rash, change in vision, change in hearing, chest pain, cough, nausea vomiting, urinary symptoms. All other systems are reviewed and are negative.  PHYSICAL EXAM  Patient is overweight. She is oriented to person time and place. Affect is normal. There is no jugulovenous distention. Lungs are clear. Respiratory effort is nonlabored. Cardiac exam her vitals S1 and S2. There no clicks or significant murmurs. The abdomen is soft. There is no peripheral edema.  Filed Vitals:   08/11/13 0940  BP: 108/60  Pulse: 80  Height: 5' 6.25" (1.683 m)  Weight: 243 lb 12.8 oz (110.587 kg)   EKG is done today and reviewed by me. There is sinus rhythm. There are nonspecific ST changes.  ASSESSMENT & PLAN

## 2013-08-11 NOTE — Assessment & Plan Note (Signed)
She's not having any significant chest pain. No change in therapy.

## 2013-08-11 NOTE — Patient Instructions (Signed)
**Note De-identified Ryatt Corsino Obfuscation** Your physician recommends that you continue on your current medications as directed. Please refer to the Current Medication list given to you today.  Your physician wants you to follow-up in: 1 year. You will receive a reminder letter in the mail two months in advance. If you don't receive a letter, please call our office to schedule the follow-up appointment.  

## 2013-08-11 NOTE — Assessment & Plan Note (Signed)
Her volume status is under good control. No change in therapy.

## 2013-08-16 ENCOUNTER — Other Ambulatory Visit: Payer: Self-pay | Admitting: Cardiology

## 2013-08-18 ENCOUNTER — Ambulatory Visit: Payer: No Typology Code available for payment source | Admitting: Gynecology

## 2013-08-29 ENCOUNTER — Other Ambulatory Visit: Payer: Self-pay | Admitting: Internal Medicine

## 2013-10-17 ENCOUNTER — Other Ambulatory Visit: Payer: Self-pay | Admitting: Internal Medicine

## 2013-10-17 ENCOUNTER — Other Ambulatory Visit: Payer: Self-pay

## 2013-10-17 MED ORDER — CARVEDILOL 25 MG PO TABS: ORAL_TABLET | ORAL | Status: DC

## 2013-10-30 ENCOUNTER — Other Ambulatory Visit: Payer: Self-pay | Admitting: Internal Medicine

## 2013-11-05 ENCOUNTER — Other Ambulatory Visit: Payer: Self-pay | Admitting: Internal Medicine

## 2013-11-27 ENCOUNTER — Other Ambulatory Visit: Payer: Self-pay | Admitting: Internal Medicine

## 2013-12-02 ENCOUNTER — Other Ambulatory Visit: Payer: Self-pay

## 2013-12-02 MED ORDER — DIGOXIN 125 MCG PO TABS: ORAL_TABLET | ORAL | Status: DC

## 2013-12-02 MED ORDER — LISINOPRIL 2.5 MG PO TABS: ORAL_TABLET | ORAL | Status: DC

## 2014-01-12 ENCOUNTER — Other Ambulatory Visit: Payer: Self-pay | Admitting: Internal Medicine

## 2014-01-15 ENCOUNTER — Other Ambulatory Visit (INDEPENDENT_AMBULATORY_CARE_PROVIDER_SITE_OTHER): Payer: PRIVATE HEALTH INSURANCE

## 2014-01-15 ENCOUNTER — Encounter: Payer: Self-pay | Admitting: Internal Medicine

## 2014-01-15 ENCOUNTER — Ambulatory Visit (INDEPENDENT_AMBULATORY_CARE_PROVIDER_SITE_OTHER): Payer: PRIVATE HEALTH INSURANCE | Admitting: Internal Medicine

## 2014-01-15 VITALS — BP 128/78 | HR 88 | Temp 98.4°F | Wt 243.1 lb

## 2014-01-15 DIAGNOSIS — E119 Type 2 diabetes mellitus without complications: Secondary | ICD-10-CM

## 2014-01-15 DIAGNOSIS — E785 Hyperlipidemia, unspecified: Secondary | ICD-10-CM

## 2014-01-15 DIAGNOSIS — Z23 Encounter for immunization: Secondary | ICD-10-CM

## 2014-01-15 DIAGNOSIS — R7989 Other specified abnormal findings of blood chemistry: Secondary | ICD-10-CM

## 2014-01-15 DIAGNOSIS — I5022 Chronic systolic (congestive) heart failure: Secondary | ICD-10-CM

## 2014-01-15 DIAGNOSIS — Z0000 Encounter for general adult medical examination without abnormal findings: Secondary | ICD-10-CM

## 2014-01-15 DIAGNOSIS — G43909 Migraine, unspecified, not intractable, without status migrainosus: Secondary | ICD-10-CM

## 2014-01-15 LAB — BASIC METABOLIC PANEL
BUN: 10 mg/dL (ref 6–23)
CALCIUM: 9.2 mg/dL (ref 8.4–10.5)
CO2: 29 meq/L (ref 19–32)
CREATININE: 0.7 mg/dL (ref 0.4–1.2)
Chloride: 103 mEq/L (ref 96–112)
GFR: 97.35 mL/min (ref >60.00)
Glucose, Bld: 111 mg/dL — ABNORMAL HIGH (ref 70–99)
Potassium: 3.9 mEq/L (ref 3.5–5.1)
Sodium: 138 mEq/L (ref 135–145)

## 2014-01-15 LAB — MICROALBUMIN / CREATININE URINE RATIO
Creatinine,U: 99.1 mg/dL
MICROALB UR: 0.4 mg/dL (ref 0.0–1.9)
Microalb Creat Ratio: 0.4 mg/g (ref 0.0–30.0)

## 2014-01-15 LAB — LIPID PANEL
Cholesterol: 202 mg/dL — ABNORMAL HIGH (ref 0–200)
HDL: 40.2 mg/dL (ref >39.00)
NONHDL: 161.8
TRIGLYCERIDES: 338 mg/dL — AB (ref 0.0–149.0)
Total CHOL/HDL Ratio: 5
VLDL: 67.6 mg/dL — ABNORMAL HIGH (ref 0.0–40.0)

## 2014-01-15 LAB — LDL CHOLESTEROL, DIRECT: Direct LDL: 130.8 mg/dL

## 2014-01-15 LAB — HEMOGLOBIN A1C: HEMOGLOBIN A1C: 6 % (ref 4.6–6.5)

## 2014-01-15 LAB — FOLLICLE STIMULATING HORMONE: FSH: 8 m[IU]/mL

## 2014-01-15 MED ORDER — SIMVASTATIN 20 MG PO TABS
20.0000 mg | ORAL_TABLET | Freq: Every day | ORAL | Status: DC
Start: 2014-01-15 — End: 2014-12-28

## 2014-01-15 MED ORDER — FLUOXETINE HCL 40 MG PO CAPS: 40.0000 mg | ORAL_CAPSULE | Freq: Every day | ORAL | Status: DC

## 2014-01-15 MED ORDER — CARVEDILOL 25 MG PO TABS: 50.0000 mg | ORAL_TABLET | Freq: Two times a day (BID) | ORAL | Status: DC

## 2014-01-15 NOTE — Patient Instructions (Signed)
It was good to see you today.  We have reviewed your prior records including labs and tests today  Test(s) ordered today. Return in 2 weeks when you are fasting and back on all meds. Your results will be released to [facility] (or called to you) after review, usually within 72hours after test completion. If any changes need to be made, you will be notified at that same time.  Medications reviewed and updated, no changes recommended at this time. Refill on medication(s) as discussed today.  Please schedule followup in 6 months, call sooner if problems.

## 2014-01-15 NOTE — Progress Notes (Signed)
Subjective:    Patient ID: Carolyn Salazar, female    DOB: 03/25/1971, 43 y.o.   MRN: [id number]  HPI   Here for medicare wellness - last OV 07/2012  Diet: heart healthy, diabetic Physical activity: sedentary Depression/mood screen: negative Hearing: intact to whispered voice Visual acuity: grossly normal, performs annual eye exam  ADLs: capable Fall risk: none Home safety: good Cognitive evaluation: intact to orientation, naming, recall and repetition EOL planning: adv directives, full code/ I agree  I have personally reviewed and have noted 1. The patient's medical and social history 2. Their use of alcohol, tobacco or illicit drugs 3. Their current medications and supplements 4. The patient's functional ability including ADL's, fall risks, home safety risks and hearing or visual impairment. 5. Diet and physical activities 6. Evidence for depression or mood disorders  Also reviewed chronic medical issues and interval medical events  Past Medical History  Diagnosis Date  . BUNIONS, BILATERAL   . CARDIOMYOPATHY 02/1999    EF 20% in 02/1999, improved over time. Echo 01/2006 - EF 50-55% EF 04/2008 35-40% EF 40-45% Echo 07/22/2009  . MIGRAINE HEADACHE   . DEPRESSION   . OBESITY   . 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 DYSLIPIDEMIA   . DIABETES MELLITUS, TYPE II, CONTROLLED, MILD   . BENIGN NEOPLASM OF SKIN SITE UNSPECIFIED   . Arthritis     Hands, Knees RT>LT  . Chest pain     Nuclear, 2007, normal ( catheterization in 2000, normal coronary arteries)  . Sinus tachycardia   . Hypotension     November, 2012  . Shingles (herpes zoster) polyneuropathy   . Ejection fraction     EF 20%, echo, 2000  //   EF 50%, echo, 2007  //   EF 35-40%, echo, 2009  //   EF 40-45%, echo, 2011  //   EF 40-45%, echo, December, 2012   Family History  Problem Relation Age of Onset  . Diabetes Mother   . Cancer Mother     Ovarian, benign mass  .  Diabetes type II Mother   . Diabetes Father   . Heart failure Father   . Heart attack Father   . Hypertension Other     Parent  . Hyperlipidemia Other     parent  . Arthritis Other     parent, grandparent  . Arthritis Sister    History  Substance Use Topics  . Smoking status: Never Smoker   . Smokeless tobacco: Never Used  . Alcohol Use: 0.6 oz/week    1 Shots of liquor per week     Comment: Rare    Review of Systems  Constitutional: Positive for fatigue. Negative for unexpected weight change.  Respiratory: Negative for cough, shortness of breath and wheezing.   Cardiovascular: Negative for chest pain, palpitations and leg swelling.  Gastrointestinal: Positive for constipation (chronic). Negative for nausea, abdominal pain and diarrhea.  Skin:       "hot flashes" - ?early menopause  Neurological: Positive for headaches (chronic migraines, ?worse with cycle changes). Negative for dizziness, weakness and light-headedness.  Psychiatric/Behavioral: Negative for dysphoric mood. The patient is not nervous/anxious.   All other systems reviewed and are negative.      Objective:   Physical Exam  BP 128/78  Pulse 88  Temp(Src) 98.4 F (36.9 C) (Oral)  Wt 243 lb 2 oz (110.281 kg)  SpO2 97% Wt Readings from Last 3 Encounters:  01/15/14 243 lb  2 oz (110.281 kg)  08/11/13 243 lb 12.8 oz (110.587 kg)  08/01/13 241 lb (109.317 kg)   Constitutional: She is MO, appears well-developed and well-nourished. No distress.  Neck: Normal range of motion. Neck supple. No JVD present. No thyromegaly present.  Cardiovascular: Normal rate, regular rhythm and normal heart sounds.  No murmur heard. No BLE edema. Pulmonary/Chest: Effort normal and breath sounds normal. No respiratory distress. She has no wheezes.  Abdomen: SNTND+BS Psychiatric: She has a reserved and normal mood/affect. Her behavior is normal. Judgment and thought content normal.   Lab Results  Component Value Date   WBC  11.2* 05/28/2013   HGB 13.8 05/28/2013   HCT 39.7 05/28/2013   PLT 348.0 05/28/2013   GLUCOSE 152* 08/12/2012   CHOL 146 08/12/2012   TRIG 289.0* 08/12/2012   HDL 31.50* 08/12/2012   LDLDIRECT 77.4 08/12/2012   ALT 21 08/12/2012   AST 19 08/12/2012   NA 137 08/12/2012   K 3.9 08/12/2012   CL 99 08/12/2012   CREATININE 0.7 08/12/2012   BUN 14 08/12/2012   CO2 28 08/12/2012   TSH 3.03 08/12/2012   HGBA1C 6.1 08/12/2012   MICROALBUR 0.5 08/12/2012    Mm Digital Screening Bilateral  08/06/2013   CLINICAL DATA:  Screening.  EXAM: DIGITAL SCREENING BILATERAL MAMMOGRAM WITH CAD  COMPARISON:  Previous exam(s).  ACR Breast Density Category b: There are scattered areas of fibroglandular density.  FINDINGS: There are no findings suspicious for malignancy. Images were processed with CAD.  IMPRESSION: No mammographic evidence of malignancy. A result letter of this screening mammogram will be mailed directly to the patient.  RECOMMENDATION: Screening mammogram in one year. (Code:[id number]-B-01Y)  BI-RADS CATEGORY  1: Negative.   Electronically Signed   By: Lovey Newcomer M.D.   On: 08/06/2013 16:17       Assessment & Plan:   AWV/v70.0 - Today patient counseled on age appropriate routine health concerns for screening and prevention, each reviewed and up to date or declined. Immunizations reviewed and up to date or declined. Labs ordered and reviewed. Risk factors for depression reviewed and negative. Hearing function and visual acuity are intact. ADLs screened and addressed as needed. Functional ability and level of safety reviewed and appropriate. Education, counseling and referrals performed based on assessed risks today. Patient provided with a copy of personalized plan for preventive services.  Problem List Items Addressed This Visit   Chronic systolic heart failure     hosp 07/10/11 due to ARI and hyperkalemia in setting of hypotension and syncope Stable since that time- clinically euvolemic Recheck labs now follow up cards  as planned    Relevant Medications      simvastatin (ZOCOR) tablet      carvedilol (COREG) tablet   DIABETES MELLITUS, TYPE II, CONTROLLED, MILD      Diet controlled - on ACEI, statin Check a1c and adjust as needed Lab Results  Component Value Date   HGBA1C 6.1 08/12/2012      Relevant Medications      simvastatin (ZOCOR) tablet   Other Relevant Orders      Hemoglobin A1c (Completed)      Basic metabolic panel (Completed)      Lipid panel (Completed)      Microalbumin / creatinine urine ratio (Completed)   DYSLIPIDEMIA     Started simvastatin 09/2009 - tolerating well Check lipids/LFT now and adjust as needed    Relevant Medications      simvastatin (ZOCOR) tablet  carvedilol (COREG) tablet   Other Relevant Orders      Lipid panel (Completed)   MIGRAINE HEADACHE     Increase intensity, ?hormone related to early menopause? Check FSH - IUD per gyn    Relevant Medications      simvastatin (ZOCOR) tablet      carvedilol (COREG) tablet      FLUoxetine (PROZAC) 40 MG capsule   Other Relevant Orders      Follicle stimulating hormone (Completed)    Other Visit Diagnoses   Routine general medical examination at a health care facility    -  Primary

## 2014-01-15 NOTE — Assessment & Plan Note (Signed)
Diet controlled - on ACEI, statin Check a1c and adjust as needed Lab Results  Component Value Date   HGBA1C 6.1 08/12/2012

## 2014-01-15 NOTE — Assessment & Plan Note (Signed)
Started simvastatin 09/2009 - tolerating well Check lipids/LFT now and adjust as needed

## 2014-01-15 NOTE — Assessment & Plan Note (Signed)
hosp 07/10/11 due to ARI and hyperkalemia in setting of hypotension and syncope Stable since that time- clinically euvolemic Recheck labs now follow up cards as planned

## 2014-01-15 NOTE — Assessment & Plan Note (Signed)
Increase intensity, ?hormone related to early menopause? Check FSH - IUD per gyn

## 2014-01-15 NOTE — Progress Notes (Signed)
Pre visit review using our clinic review tool, if applicable. No additional management support is needed unless otherwise documented below in the visit note. 

## 2014-01-16 NOTE — Addendum Note (Signed)
Addended by: Lowella Dandy on: 01/16/2014 08:04 AM   Modules accepted: Orders

## 2014-02-27 ENCOUNTER — Telehealth: Payer: Self-pay | Admitting: Cardiology

## 2014-02-27 NOTE — Telephone Encounter (Signed)
Yes it is safe for her to get this vaccine.

## 2014-02-27 NOTE — Telephone Encounter (Signed)
**Note De-Identified Carolyn Salazar Obfuscation** The pt is advised and she verbalized understanding. Per the patients request I am mailing her this phone note as documentation that it is okay from a cardiac stand point for her to have vaccine.

## 2014-02-27 NOTE — Telephone Encounter (Signed)
Follow Up   Pt returning call from earlier. Please call.

## 2014-02-27 NOTE — Telephone Encounter (Signed)
New problem    Pt want to know if its safe for her to get a MMR vaccine and it is live culture. Please advise.

## 2014-02-27 NOTE — Telephone Encounter (Signed)
**Note De-identified [place] Folden Obfuscation** Please advise 

## 2014-02-27 NOTE — Telephone Encounter (Signed)
LMTCB

## 2014-03-23 ENCOUNTER — Encounter: Payer: Self-pay | Admitting: Internal Medicine

## 2014-04-17 ENCOUNTER — Other Ambulatory Visit: Payer: Self-pay | Admitting: Internal Medicine

## 2014-04-22 ENCOUNTER — Encounter (HOSPITAL_COMMUNITY): Payer: Self-pay | Admitting: *Deleted

## 2014-04-22 ENCOUNTER — Other Ambulatory Visit: Payer: Self-pay | Admitting: Cardiology

## 2014-04-22 ENCOUNTER — Emergency Department (INDEPENDENT_AMBULATORY_CARE_PROVIDER_SITE_OTHER)
Admission: EM | Admit: 2014-04-22 | Discharge: 2014-04-22 | Disposition: A | Discharge status: Home / Self Care | Payer: Commercial Managed Care - PPO | Source: Home / Self Care | Attending: Emergency Medicine | Admitting: Emergency Medicine

## 2014-04-22 DIAGNOSIS — B354 Tinea corporis: Secondary | ICD-10-CM | POA: Diagnosis not present

## 2014-04-22 MED ORDER — CLOTRIMAZOLE-BETAMETHASONE 1-0.05 % EX CREA: TOPICAL_CREAM | CUTANEOUS | Status: DC

## 2014-04-22 NOTE — ED Provider Notes (Signed)
CSN: [id number]     Arrival date & time 04/22/14  1108 History   First MD Initiated Contact with Patient 04/22/14 1140     Chief Complaint  Patient presents with  . Rash   (Consider location/radiation/quality/duration/timing/severity/associated sxs/prior Treatment) HPI          43 year old female presents for evaluation of a rash on her left forearm. She has had an itchy circular red rash since yesterday. She did not have a similar rash elsewhere. No recent contact with anyone with rashes, although she does have a stray cat living in her house that has some undiagnosed skin condition. No systemic symptoms.  Past Medical History  Diagnosis Date  . BUNIONS, BILATERAL   . CARDIOMYOPATHY 02/1999    EF 20% in 02/1999, improved over time. Echo 01/2006 - EF 50-55% EF 04/2008 35-40% EF 40-45% Echo 07/22/2009  . MIGRAINE HEADACHE   . DEPRESSION   . OBESITY   . 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 DYSLIPIDEMIA   . DIABETES MELLITUS, TYPE II, CONTROLLED, MILD   . BENIGN NEOPLASM OF SKIN SITE UNSPECIFIED   . Arthritis     Hands, Knees RT>LT  . Chest pain     Nuclear, 2007, normal ( catheterization in 2000, normal coronary arteries)  . Sinus tachycardia   . Hypotension     November, 2012  . Shingles (herpes zoster) polyneuropathy   . Ejection fraction     EF 20%, echo, 2000  //   EF 50%, echo, 2007  //   EF 35-40%, echo, 2009  //   EF 40-45%, echo, 2011  //   EF 40-45%, echo, December, 2012   Past Surgical History  Procedure Laterality Date  . Cardiac catheterization    . Heart biopsy    . Vein scope    . Bunionectomy  2012    LEFT   Family History  Problem Relation Age of Onset  . Diabetes Mother   . Cancer Mother     Ovarian, benign mass  . Diabetes type II Mother   . Diabetes Father   . Heart failure Father   . Heart attack Father   . Hypertension Other     Parent  . Hyperlipidemia Other     parent  . Arthritis Other     parent,  grandparent  . Arthritis Sister    History  Substance Use Topics  . Smoking status: Never Smoker   . Smokeless tobacco: Never Used  . Alcohol Use: 0.6 oz/week    1 Shots of liquor per week     Comment: Rare   OB History    Gravida Para Term Preterm AB TAB SAB Ectopic Multiple Living   1 1 1       1      Review of Systems  Skin: Positive for rash.    Allergies  Sulfonamide derivatives  Home Medications   Prior to Admission medications   Medication Sig Start Date End Date Taking? Authorizing Provider  carvedilol (COREG) 25 MG tablet Take 2 tablets (50 mg total) by mouth 2 (two) times daily with a meal. 04/20/14   Rowe Clack, MD  cetirizine (ZYRTEC) 10 MG tablet Take 10 mg by mouth daily.      Historical Provider, MD  clobetasol cream (TEMOVATE) 0.05 % Applied twice a day for 2 weeks then twice weekly 08/01/13   Terrance Mass, MD  clotrimazole-betamethasone (LOTRISONE) cream Apply to affected area 2 times daily  prn 04/22/14   Liam Graham, PA-C  digoxin (DIGOX) 0.125 MG tablet TAKE 1 TABLET BY MOUTH DAILY 12/02/13   Carlena Bjornstad, MD  Esomeprazole Magnesium (NEXIUM PO) Take by mouth daily.    Historical Provider, MD  FIBER SELECT GUMMIES PO Take 2 tablets by mouth every morning.    Historical Provider, MD  FLUoxetine (PROZAC) 40 MG capsule Take 1 capsule (40 mg total) by mouth daily. 01/15/14   Rowe Clack, MD  furosemide (LASIX) 40 MG tablet TAKE 1 TABLET BY MOUTH EVERY DAY    Carlena Bjornstad, MD  Glucosamine-Chondroitin (GLUCOSAMINE CHONDR COMPLEX) 500-400 MG CAPS Take by mouth daily.    Historical Provider, MD  IRON PO Take 45 mg by mouth daily.     Historical Provider, MD  lamoTRIgine (LAMICTAL) 200 MG tablet Take 400 mg by mouth daily.     Historical Provider, MD  lisinopril (PRINIVIL,ZESTRIL) 2.5 MG tablet TAKE 1 TABLET BY MOUTH EVERY DAY 12/02/13   Carlena Bjornstad, MD  multivitamin-iron-minerals-folic acid (CENTRUM) chewable tablet Chew 2 tablets by mouth  daily. 07/10/11 08/11/13  Acquanetta Chain, DO  potassium chloride SA (K-DUR,KLOR-CON) 20 MEQ tablet TAKE 1 TABLET BY MOUTH EVERY DAY 04/08/13   Jolaine Artist, MD  simvastatin (ZOCOR) 20 MG tablet Take 1 tablet (20 mg total) by mouth daily at 6 PM. 01/15/14   Rowe Clack, MD  zolpidem (AMBIEN) 10 MG tablet Take 10 mg by mouth at bedtime as needed.    Historical Provider, MD   Pulse 96  Temp(Src) 98.3 F (36.8 C) (Oral)  Resp 18  SpO2 97%  LMP 04/22/2014 Physical Exam  Constitutional: She is oriented to person, place, and time. Vital signs are normal. She appears well-developed and well-nourished. No distress.  HENT:  Head: Normocephalic and atraumatic.  Pulmonary/Chest: Effort normal. No respiratory distress.  Neurological: She is alert and oriented to person, place, and time. She has normal strength. Coordination normal.  Skin: Skin is warm and dry. Rash (pink, scaling, circular circumscribed rash on the anterior proximal left forearm, with a raised edge and central clearing) noted. She is not diaphoretic.  Psychiatric: She has a normal mood and affect. Judgment normal.  Nursing note and vitals reviewed.   ED Course  Procedures (including critical care time) Labs Review Labs Reviewed - No data to display  Imaging Review No results found.   MDM   1. Tinea corporis    Consistent with tinea corporis, treat with Lotrisone for 2 weeks. Follow-up when necessary.  Meds ordered this encounter  Medications  . clotrimazole-betamethasone (LOTRISONE) cream    Sig: Apply to affected area 2 times daily prn    Dispense:  15 g    Refill:  0    Order Specific Question:  Supervising Provider    Answer:  Ihor Gully D [[id number]]       Liam Graham, PA-C 04/22/14 [phone]

## 2014-04-22 NOTE — Discharge Instructions (Signed)

## 2014-04-22 NOTE — ED Notes (Signed)
Pt     Reports       She noticed    A      Rash    On inner  Aspect of  l arm     Since   Sat      No   Angioedema     Sitting  Upright  On  Exam table in no acute distress

## 2014-06-26 ENCOUNTER — Other Ambulatory Visit: Payer: Self-pay | Admitting: Cardiology

## 2014-07-13 ENCOUNTER — Other Ambulatory Visit: Payer: Self-pay | Admitting: Cardiology

## 2014-07-14 ENCOUNTER — Emergency Department (INDEPENDENT_AMBULATORY_CARE_PROVIDER_SITE_OTHER)
Admission: EM | Admit: 2014-07-14 | Discharge: 2014-07-14 | Disposition: A | Discharge status: Home / Self Care | Payer: Commercial Managed Care - PPO | Source: Home / Self Care | Attending: Emergency Medicine | Admitting: Emergency Medicine

## 2014-07-14 ENCOUNTER — Encounter: Payer: Self-pay | Admitting: *Deleted

## 2014-07-14 DIAGNOSIS — B354 Tinea corporis: Secondary | ICD-10-CM

## 2014-07-14 MED ORDER — TERBINAFINE HCL 250 MG PO TABS: 250.0000 mg | ORAL_TABLET | Freq: Every day | ORAL | Status: DC

## 2014-07-14 NOTE — ED Notes (Signed)
Pt c/o rash on her LT hand, 2 spots on her back and one near her coccyx area x 2 wks. She reports a hx of ringworm and has run out of meds.

## 2014-07-14 NOTE — ED Provider Notes (Signed)
CSN: [id number]     Arrival date & time 07/14/14  1057 History   First MD Initiated Contact with Patient 07/14/14 1112     Chief Complaint  Patient presents with  . Rash   (Consider location/radiation/quality/duration/timing/severity/associated sxs/prior Treatment) HPI Pt c/o rash on her LT hand, 2 spots on her back and one near her coccyx area x 2 wks. She reports a hx of "ringworm" which improved somewhat with topical antifungal cream but now worsened now that she stopped the antifungal cream. Not particularly pruritic. No associated ENT are cardiorespiratory or GI symptoms. No fever or chills or nausea or vomiting. She denies chance of pregnancy Past Medical History  Diagnosis Date  . BUNIONS, BILATERAL   . CARDIOMYOPATHY 02/1999    EF 20% in 02/1999, improved over time. Echo 01/2006 - EF 50-55% EF 04/2008 35-40% EF 40-45% Echo 07/22/2009  . MIGRAINE HEADACHE   . DEPRESSION   . OBESITY   . 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 DYSLIPIDEMIA   . DIABETES MELLITUS, TYPE II, CONTROLLED, MILD   . BENIGN NEOPLASM OF SKIN SITE UNSPECIFIED   . Arthritis     Hands, Knees RT>LT  . Chest pain     Nuclear, 2007, normal ( catheterization in 2000, normal coronary arteries)  . Sinus tachycardia   . Hypotension     November, 2012  . Shingles (herpes zoster) polyneuropathy   . Ejection fraction     EF 20%, echo, 2000  //   EF 50%, echo, 2007  //   EF 35-40%, echo, 2009  //   EF 40-45%, echo, 2011  //   EF 40-45%, echo, December, 2012   Past Surgical History  Procedure Laterality Date  . Cardiac catheterization    . Heart biopsy    . Vein scope    . Bunionectomy  2012    LEFT   Family History  Problem Relation Age of Onset  . Diabetes Mother   . Cancer Mother     Ovarian, benign mass  . Diabetes type II Mother   . Hyperlipidemia Mother   . Diabetes Father   . Heart failure Father   . Heart attack Father   . Hypertension Other     Parent  .  Hyperlipidemia Other     parent  . Arthritis Other     parent, grandparent  . Arthritis Sister    History  Substance Use Topics  . Smoking status: Never Smoker   . Smokeless tobacco: Never Used  . Alcohol Use: 2.4 oz/week    4 Glasses of wine per week   OB History    Gravida Para Term Preterm AB TAB SAB Ectopic Multiple Living   1 1 1       1      Review of Systems  All other systems reviewed and are negative.   Allergies  Sulfonamide derivatives  Home Medications   Prior to Admission medications   Medication Sig Start Date End Date Taking? Authorizing Provider  carvedilol (COREG) 25 MG tablet Take 2 tablets (50 mg total) by mouth 2 (two) times daily with a meal. 04/20/14   Rowe Clack, MD  cetirizine (ZYRTEC) 10 MG tablet Take 10 mg by mouth daily.      Historical Provider, MD  clobetasol cream (TEMOVATE) 0.05 % Applied twice a day for 2 weeks then twice weekly 08/01/13   Terrance Mass, MD  clotrimazole-betamethasone (LOTRISONE) cream Apply to affected area 2  times daily prn 04/22/14   Liam Graham, PA-C  DIGOX 125 MCG tablet TAKE 1 TABLET BY MOUTH DAILY 06/29/14   Carlena Bjornstad, MD  Esomeprazole Magnesium (NEXIUM PO) Take by mouth daily.    Historical Provider, MD  FIBER SELECT GUMMIES PO Take 2 tablets by mouth every morning.    Historical Provider, MD  FLUoxetine (PROZAC) 40 MG capsule Take 1 capsule (40 mg total) by mouth daily. 01/15/14   Rowe Clack, MD  furosemide (LASIX) 40 MG tablet TAKE 1 TABLET BY MOUTH EVERY DAY    Carlena Bjornstad, MD  Glucosamine-Chondroitin (GLUCOSAMINE CHONDR COMPLEX) 500-400 MG CAPS Take by mouth daily.    Historical Provider, MD  IRON PO Take 45 mg by mouth daily.     Historical Provider, MD  lamoTRIgine (LAMICTAL) 200 MG tablet Take 400 mg by mouth daily.     Historical Provider, MD  lisinopril (PRINIVIL,ZESTRIL) 2.5 MG tablet TAKE 1 TABLET BY MOUTH EVERY DAY 07/14/14   Carlena Bjornstad, MD  multivitamin-iron-minerals-folic acid  (CENTRUM) chewable tablet Chew 2 tablets by mouth daily. 07/10/11 08/11/13  Acquanetta Chain, DO  potassium chloride SA (K-DUR,KLOR-CON) 20 MEQ tablet TAKE 1 TABLET BY MOUTH EVERY DAY 04/23/14   Carlena Bjornstad, MD  simvastatin (ZOCOR) 20 MG tablet Take 1 tablet (20 mg total) by mouth daily at 6 PM. 01/15/14   Rowe Clack, MD  terbinafine (LAMISIL) 250 MG tablet Take 1 tablet (250 mg total) by mouth daily. 07/14/14   Jacqulyn Cane, MD  zolpidem (AMBIEN) 10 MG tablet Take 10 mg by mouth at bedtime as needed.    Historical Provider, MD   BP 95/62 mmHg  Pulse 86  Temp(Src) 97.6 F (36.4 C) (Oral)  Resp 18  Ht 5' 6"  (1.676 m)  Wt 243 lb (110.224 kg)  BMI 39.24 kg/m2  SpO2 97% Physical Exam  Constitutional: She is oriented to person, place, and time. She appears well-developed and well-nourished. No distress.  HENT:  Head: Normocephalic and atraumatic.  Eyes: Conjunctivae and EOM are normal. Pupils are equal, round, and reactive to light. No scleral icterus.  Neck: Normal range of motion.  Cardiovascular: Normal rate.   Pulmonary/Chest: Effort normal.  Abdominal: She exhibits no distension.  Musculoskeletal: Normal range of motion.  Neurological: She is alert and oriented to person, place, and time.  Skin: Skin is warm. Rash noted.  Annular patches, red raised edges with mostly clear centers. On back, shoulder, and left hand. No pustules or vesicles or induration or fluctuance or drainage  Psychiatric: She has a normal mood and affect.  Nursing note and vitals reviewed.   ED Course  Procedures (including critical care time) Labs Review Labs Reviewed - No data to display  Imaging Review No results found.    MDM   1. Tinea corporis    Treatment options discussed, as well as risks, benefits, alternatives. Patient voiced understanding and agreement with the following plans:  Lamisil 250 mg tablet, one daily 14 days And Lamisil cream twice a day Questions invited and  answered. Other advice given. Follow-up with dermatologist in 14 days if not improving, or sooner if symptoms become worse. Precautions discussed. Red flags discussed. Questions invited and answered. Patient voiced understanding and agreement.   Jacqulyn Cane, MD 07/14/14 [phone]

## 2014-07-17 ENCOUNTER — Telehealth: Payer: Self-pay | Admitting: Internal Medicine

## 2014-07-17 NOTE — Telephone Encounter (Signed)
Moved Tuesday appointment to July since Carolyn Salazar is out.  Patient would like to know if she needs to have any labs done before that time.

## 2014-07-17 NOTE — Telephone Encounter (Signed)
LVM for pt to call back.

## 2014-07-21 ENCOUNTER — Ambulatory Visit: Payer: PRIVATE HEALTH INSURANCE | Admitting: Internal Medicine

## 2014-07-21 NOTE — Telephone Encounter (Signed)
Spoke with pt regarding labs prior to appt. Informed pt that labs were not needed prior to appt in July.

## 2014-07-24 ENCOUNTER — Telehealth: Payer: Self-pay | Admitting: Internal Medicine

## 2014-07-24 MED ORDER — CARVEDILOL 25 MG PO TABS: 50.0000 mg | ORAL_TABLET | Freq: Two times a day (BID) | ORAL | Status: DC

## 2014-07-24 NOTE — Telephone Encounter (Signed)
Calling in a refill of carvedilol (COREG) 25 MG tablet [[id number]]

## 2014-07-24 NOTE — Telephone Encounter (Signed)
erx done

## 2014-08-06 ENCOUNTER — Other Ambulatory Visit: Payer: Self-pay

## 2014-08-06 DIAGNOSIS — Z1231 Encounter for screening mammogram for malignant neoplasm of breast: Secondary | ICD-10-CM

## 2014-08-11 ENCOUNTER — Ambulatory Visit: Payer: Medicare Other

## 2014-08-12 ENCOUNTER — Ambulatory Visit (INDEPENDENT_AMBULATORY_CARE_PROVIDER_SITE_OTHER): Payer: Commercial Managed Care - PPO | Admitting: Cardiology

## 2014-08-12 ENCOUNTER — Encounter: Payer: Self-pay | Admitting: Cardiology

## 2014-08-12 VITALS — BP 102/78 | HR 80 | Ht 66.5 in | Wt 243.0 lb

## 2014-08-12 DIAGNOSIS — I5042 Chronic combined systolic (congestive) and diastolic (congestive) heart failure: Secondary | ICD-10-CM

## 2014-08-12 DIAGNOSIS — I429 Cardiomyopathy, unspecified: Secondary | ICD-10-CM | POA: Diagnosis not present

## 2014-08-12 DIAGNOSIS — R072 Precordial pain: Secondary | ICD-10-CM | POA: Diagnosis not present

## 2014-08-12 DIAGNOSIS — E785 Hyperlipidemia, unspecified: Secondary | ICD-10-CM

## 2014-08-12 DIAGNOSIS — I428 Other cardiomyopathies: Secondary | ICD-10-CM

## 2014-08-12 LAB — BASIC METABOLIC PANEL
BUN: 10 mg/dL (ref 6–23)
CALCIUM: 9.3 mg/dL (ref 8.4–10.5)
CHLORIDE: 102 meq/L (ref 96–112)
CO2: 28 meq/L (ref 19–32)
Creatinine, Ser: 0.71 mg/dL (ref 0.40–1.20)
GFR: 95.51 mL/min (ref >60.00)
Glucose, Bld: 151 mg/dL — ABNORMAL HIGH (ref 70–99)
POTASSIUM: 4 meq/L (ref 3.5–5.1)
SODIUM: 137 meq/L (ref 135–145)

## 2014-08-12 NOTE — Assessment & Plan Note (Addendum)
There is history of nonischemic cardiomyopathy. A fistula function returned to low normal and has remained such for several years. Her last echo was 2014. I've chosen not to repeat an echo at this time. This can be considered again in another year. She is fully active. No further workup. It appears that she has not had labs for greater than 6 months. I will review this with her. If there've been no labs, chemistry will be checked as she is on a diuretic

## 2014-08-12 NOTE — Progress Notes (Signed)
Cardiology Office Note   Date:  08/12/2014   ID:  Carolyn Salazar, DOB 09-21-1970, MRN [id number]  PCP:  Gwendolyn Grant, MD  Cardiologist:  Dola Argyle, MD   Chief Complaint  Patient presents with  . Appointment    Follow-up cardiomyopathy      History of Present Illness: Carolyn Salazar is a 44 y.o. female who presents today to follow-up her prior history of nonischemic cardiomyopathy. She had significant reduction in her ejection fraction the past. She has been treated with appropriate medications and has done well. Her last echo showed that her ejection fraction was in the 50-55% range. I've continued her carvedilol. Of also continued low-dose ACE inhibitor. Blood pressure runs on the low side and therefore the ACE inhibitor dose is small. She is fully active. She is not having any significant problems.    Past Medical History  Diagnosis Date  . BUNIONS, BILATERAL   . CARDIOMYOPATHY 02/1999    EF 20% in 02/1999, improved over time. Echo 01/2006 - EF 50-55% EF 04/2008 35-40% EF 40-45% Echo 07/22/2009  . MIGRAINE HEADACHE   . DEPRESSION   . OBESITY   . 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 DYSLIPIDEMIA   . DIABETES MELLITUS, TYPE II, CONTROLLED, MILD   . BENIGN NEOPLASM OF SKIN SITE UNSPECIFIED   . Arthritis     Hands, Knees RT>LT  . Chest pain     Nuclear, 2007, normal ( catheterization in 2000, normal coronary arteries)  . Sinus tachycardia   . Hypotension     November, 2012  . Shingles (herpes zoster) polyneuropathy   . Ejection fraction     EF 20%, echo, 2000  //   EF 50%, echo, 2007  //   EF 35-40%, echo, 2009  //   EF 40-45%, echo, 2011  //   EF 40-45%, echo, December, 2012    Past Surgical History  Procedure Laterality Date  . Cardiac catheterization    . Heart biopsy    . Vein scope    . Bunionectomy  2012    LEFT    Patient Active Problem List   Diagnosis Date Noted  . Chest pain     Priority: High  .  CARDIOMYOPATHY 02/20/1999    Priority: High  . Ejection fraction   . Anemia 09/07/2011  . Chronic systolic heart failure 83/72/9021  . HEARING LOSS 08/02/2010  . BUNIONS, BILATERAL 08/02/2010  . BENIGN NEOPLASM OF SKIN SITE UNSPECIFIED 01/10/2010  . DIABETES MELLITUS, TYPE II, CONTROLLED, MILD 10/08/2009  . DYSLIPIDEMIA 10/07/2009  . METABOLIC SYNDROME X 11/55/2080  . OBESITY 10/07/2009  . DEPRESSION 10/07/2009  . MIGRAINE HEADACHE 10/07/2009  . ARTHRITIS 10/07/2009  . CHICKENPOX, HX OF 10/07/2009      Current Outpatient Prescriptions  Medication Sig Dispense Refill  . carvedilol (COREG) 25 MG tablet Take 2 tablets (50 mg total) by mouth 2 (two) times daily with a meal. 360 tablet 1  . cetirizine (ZYRTEC) 10 MG tablet Take 10 mg by mouth daily.      . clobetasol cream (TEMOVATE) 0.05 % Applied twice a day for 2 weeks then twice weekly 30 g 3  . clotrimazole-betamethasone (LOTRISONE) cream Apply to affected area 2 times daily prn 15 g 0  . DIGOX 125 MCG tablet TAKE 1 TABLET BY MOUTH DAILY 30 tablet 3  . Esomeprazole Magnesium (NEXIUM PO) Take by mouth daily.    Marland Kitchen FIBER SELECT GUMMIES PO Take 2 tablets by mouth  every morning.    Marland Kitchen FLUoxetine (PROZAC) 40 MG capsule Take 1 capsule (40 mg total) by mouth daily. 90 capsule 3  . furosemide (LASIX) 40 MG tablet TAKE 1 TABLET BY MOUTH EVERY DAY 90 tablet 3  . Glucosamine-Chondroitin (GLUCOSAMINE CHONDR COMPLEX) 500-400 MG CAPS Take by mouth daily.    . IRON PO Take 45 mg by mouth daily.     Marland Kitchen lamoTRIgine (LAMICTAL) 200 MG tablet Take 400 mg by mouth daily.     Marland Kitchen lisinopril (PRINIVIL,ZESTRIL) 2.5 MG tablet TAKE 1 TABLET BY MOUTH EVERY DAY 30 tablet 1  . multivitamin-iron-minerals-folic acid (CENTRUM) chewable tablet Chew 2 tablets by mouth daily.    . potassium chloride SA (K-DUR,KLOR-CON) 20 MEQ tablet TAKE 1 TABLET BY MOUTH EVERY DAY 90 tablet 1  . simvastatin (ZOCOR) 20 MG tablet Take 1 tablet (20 mg total) by mouth daily at 6 PM. 90  tablet 3  . terbinafine (LAMISIL) 250 MG tablet Take 1 tablet (250 mg total) by mouth daily. 14 tablet 1  . zolpidem (AMBIEN) 10 MG tablet Take 10 mg by mouth at bedtime as needed.     Current Facility-Administered Medications  Medication Dose Route Frequency Provider Last Rate Last Dose  . levonorgestrel (MIRENA) 20 MCG/24HR IUD   Intrauterine Once Terrance Mass, MD        Allergies:   Sulfonamide derivatives    Social History:  The patient  reports that she has never smoked. She has never used smokeless tobacco. She reports that she drinks about 2.4 oz of alcohol per week. She reports that she does not use illicit drugs.   Family History:  The patient's family history includes Arthritis in her other and sister; Cancer in her mother; Diabetes in her father and mother; Diabetes type II in her mother; Heart attack in her father; Heart failure in her father; Hyperlipidemia in her mother and other; Hypertension in her other.    ROS:  Please see the history of present illness.     Patient denies fever, chills, headache, sweats, rash, change in vision, change in hearing, chest pain, cough, nausea or vomiting, urinary symptoms. All other systems are reviewed and are negative.   PHYSICAL EXAM: VS:  BP 102/78 mmHg  Pulse 80  Ht 5' 6.5" (1.689 m)  Wt 243 lb (110.224 kg)  BMI 38.64 kg/m2 , Patient is overweight but her weight is stable. Head is atraumatic. Sclera and conjunctiva are normal. There is no jugular venous distention. Lungs are clear. Respiratory effort is nonlabored. Cardiac exam reveals an S1 and S2. The abdomen is soft. There is no peripheral edema. There are no musculoskeletal deformities. There are no skin rashes.  EKG:  EKG is done today and reviewed by me. There is sinus rhythm. There are old nonspecific ST-T wave changes. There is an old Q-wave in lead 3. There is no significant change.    Recent Labs: 01/15/2014: BUN 10; Creatinine 0.7; Potassium 3.9; Sodium 138     Lipid Panel    Component Value Date/Time   CHOL 202* 01/15/2014 1209   TRIG 338.0* 01/15/2014 1209   HDL 40.20 01/15/2014 1209   CHOLHDL 5 01/15/2014 1209   VLDL 67.6* 01/15/2014 1209   LDLDIRECT 130.8 01/15/2014 1209      Wt Readings from Last 3 Encounters:  08/12/14 243 lb (110.224 kg)  07/14/14 243 lb (110.224 kg)  01/15/14 243 lb 2 oz (110.281 kg)      Current medicines are reviewed  The patient  understands her medications.     ASSESSMENT AND PLAN:

## 2014-08-12 NOTE — Assessment & Plan Note (Signed)
The patient had returned to normal LV function. Her volume status remains stable. She does require a small dose of diuretics.

## 2014-08-12 NOTE — Assessment & Plan Note (Signed)
Patient has not had any significant chest pain. No further workup.

## 2014-08-12 NOTE — Patient Instructions (Signed)
**Note De-Identified Carolyn Salazar Obfuscation** Your physician recommends that you continue on your current medications as directed. Please refer to the Current Medication list given to you today.  Your physician recommends that you return for lab work in: today Artist)  Your physician wants you to follow-up in: 1 year. You will receive a reminder letter in the mail two months in advance. If you don't receive a letter, please call our office to schedule the follow-up appointment.

## 2014-08-19 ENCOUNTER — Other Ambulatory Visit: Payer: Self-pay | Admitting: Cardiology

## 2014-09-01 ENCOUNTER — Encounter: Payer: Self-pay | Admitting: Gynecology

## 2014-09-01 ENCOUNTER — Ambulatory Visit (INDEPENDENT_AMBULATORY_CARE_PROVIDER_SITE_OTHER): Payer: Commercial Managed Care - PPO | Admitting: Gynecology

## 2014-09-01 ENCOUNTER — Other Ambulatory Visit (HOSPITAL_COMMUNITY)
Admission: RE | Admit: 2014-09-01 | Discharge: 2014-09-01 | Disposition: A | Discharge status: Home / Self Care | Payer: Commercial Managed Care - PPO | Source: Ambulatory Visit | Attending: Gynecology | Admitting: Gynecology

## 2014-09-01 VITALS — BP 126/82 | Ht 66.5 in | Wt 245.0 lb

## 2014-09-01 DIAGNOSIS — R87619 Unspecified abnormal cytological findings in specimens from cervix uteri: Secondary | ICD-10-CM | POA: Insufficient documentation

## 2014-09-01 DIAGNOSIS — Z124 Encounter for screening for malignant neoplasm of cervix: Secondary | ICD-10-CM | POA: Diagnosis not present

## 2014-09-01 DIAGNOSIS — L292 Pruritus vulvae: Secondary | ICD-10-CM

## 2014-09-01 DIAGNOSIS — Z1151 Encounter for screening for human papillomavirus (HPV): Secondary | ICD-10-CM | POA: Diagnosis present

## 2014-09-01 DIAGNOSIS — Z862 Personal history of diseases of the blood and blood-forming organs and certain disorders involving the immune mechanism: Secondary | ICD-10-CM | POA: Diagnosis not present

## 2014-09-01 DIAGNOSIS — Z01419 Encounter for gynecological examination (general) (routine) without abnormal findings: Secondary | ICD-10-CM

## 2014-09-01 LAB — CBC WITH DIFFERENTIAL/PLATELET
Basophils Absolute: 0 K/uL (ref 0.0–0.1)
Basophils Relative: 0 % (ref 0–1)
Eosinophils Absolute: 0.3 K/uL (ref 0.0–0.7)
Eosinophils Relative: 3 % (ref 0–5)
HCT: 39 % (ref 36.0–46.0)
Hemoglobin: 13.1 g/dL (ref 12.0–15.0)
Lymphocytes Relative: 35 % (ref 12–46)
Lymphs Abs: 3.9 K/uL (ref 0.7–4.0)
MCH: 30 pg (ref 26.0–34.0)
MCHC: 33.6 g/dL (ref 30.0–36.0)
MCV: 89.4 fL (ref 78.0–100.0)
MPV: 9.5 fL (ref 8.6–12.4)
Monocytes Absolute: 0.7 K/uL (ref 0.1–1.0)
Monocytes Relative: 6 % (ref 3–12)
Neutro Abs: 6.2 K/uL (ref 1.7–7.7)
Neutrophils Relative %: 56 % (ref 43–77)
Platelets: 329 K/uL (ref 150–400)
RBC: 4.36 MIL/uL (ref 3.87–5.11)
RDW: 13.6 % (ref 11.5–15.5)
WBC: 11 K/uL — ABNORMAL HIGH (ref 4.0–10.5)

## 2014-09-01 MED ORDER — CLOBETASOL PROPIONATE 0.05 % EX CREA: 1.0000 "application " | TOPICAL_CREAM | Freq: Two times a day (BID) | CUTANEOUS | Status: DC

## 2014-09-01 NOTE — Progress Notes (Signed)
Carolyn Salazar [birth year] [id number]   History:    44 y.o.  for annual gyn exam with complaint of vulvar pruritus once again and irritation. She has used Mycolog when necessary in the past. She also had been prescribed clobetasol 0.05% in the past been no longer taking.She denies any vaginal discharge. She is in a monogamous relationship.She does have a Mirena IUD that was placed in 2013. Her primary physician is Dr. Ripley Fraise who is been following her for metabolic syndrome. Patient scheduled to see her next week and will be obtaining her lab work at that time. She also has been seen in the past by Dr. Ron Parker for nonischemic cardiomyopathy. She has an appointment with her cardiologist in the next few weeks as well. Patient states her cycles are spotting for a few days month. Patient with no past history of abnormal Pap smears. Patient reports having history of anemia in the past and had been on iron supplementation but stopped it over a month ago because of constipation. She would like to have her CBC check today. She is not due for her annual labs with her PCP to later in the year.  Past medical history,surgical history, family history and social history were all reviewed and documented in the EPIC chart.  Gynecologic History Patient's last menstrual period was 08/24/2014. Contraception: IUD Last Pap: 2013. Results were: normal Last mammogram: 2015. Results were: normal  Obstetric History OB History  Gravida Para Term Preterm AB SAB TAB Ectopic Multiple Living  1 1 1       1     # Outcome Date GA Lbr Len/2nd Weight Sex Delivery Anes PTL Lv  1 Term     F Vag-Spont  N Y       ROS: A ROS was performed and pertinent positives and negatives are included in the history.  GENERAL: No fevers or chills. HEENT: No change in vision, no earache, sore throat or sinus congestion. NECK: No pain or stiffness. CARDIOVASCULAR: No chest pain or pressure. No palpitations. PULMONARY: No shortness of  breath, cough or wheeze. GASTROINTESTINAL: No abdominal pain, nausea, vomiting or diarrhea, melena or bright red blood per rectum. GENITOURINARY: No urinary frequency, urgency, hesitancy or dysuria. MUSCULOSKELETAL: No joint or muscle pain, no back pain, no recent trauma. DERMATOLOGIC: No rash, no itching, no lesions. ENDOCRINE: No polyuria, polydipsia, no heat or cold intolerance. No recent change in weight. HEMATOLOGICAL: No anemia or easy bruising or bleeding. NEUROLOGIC: No headache, seizures, numbness, tingling or weakness. PSYCHIATRIC: No depression, no loss of interest in normal activity or change in sleep pattern.     Exam: chaperone present  BP 126/82 mmHg  Ht 5' 6.5" (1.689 m)  Wt 245 lb (111.131 kg)  BMI 38.96 kg/m2  LMP 08/24/2014  Body mass index is 38.96 kg/(m^2).  General appearance : Well developed well nourished female. No acute distress HEENT: Eyes: no retinal hemorrhage or exudates,  Neck supple, trachea midline, no carotid bruits, no thyroidmegaly Lungs: Clear to auscultation, no rhonchi or wheezes, or rib retractions  Heart: Regular rate and rhythm, no murmurs or gallops Breast:Examined in sitting and supine position were symmetrical in appearance, no palpable masses or tenderness,  no skin retraction, no nipple inversion, no nipple discharge, no skin discoloration, no axillary or supraclavicular lymphadenopathy Abdomen: no palpable masses or tenderness, no rebound or guarding Extremities: no edema or skin discoloration or tenderness  Pelvic:  Vulvar irritation/hyperemic  Bartholin, Urethra, Skene Glands: Within normal limits  Vagina: No gross lesions or discharge  Cervix: No gross lesions or discharge, IUD string seen  Uterus  anteverted, normal size, shape and consistency, non-tender and mobile  Adnexa  Without masses or tenderness  Anus and perineum  normal   Rectovaginal  normal sphincter tone without palpated masses or tenderness              Hemoccult not indicated     Assessment/Plan:  44 y.o. female for annual exam with chronic vulvitis will be prescribed clobetasol 0.05% as before but she will apply in the following fashion: Apply twice a day for 1 week. On the second week she will apply it 3 times a week. On the third week and thereafter she will apply once a week. A CBC because of her past history of anemia was ordered today. Her Pap smear was done today. Patient was reminded to schedule her mammogram to do her monthly breast examination. Patient to follow-up with PCP at the end of the year for annual exam.   Terrance Mass MD, 2:57 PM 09/01/2014

## 2014-09-03 LAB — CYTOLOGY - PAP

## 2014-09-11 ENCOUNTER — Other Ambulatory Visit: Payer: Self-pay | Admitting: Cardiology

## 2014-10-13 ENCOUNTER — Other Ambulatory Visit: Payer: Self-pay | Admitting: Cardiology

## 2014-10-14 ENCOUNTER — Other Ambulatory Visit: Payer: Commercial Managed Care - PPO | Admitting: Anesthesiology

## 2014-10-14 DIAGNOSIS — Z1211 Encounter for screening for malignant neoplasm of colon: Secondary | ICD-10-CM

## 2014-10-15 ENCOUNTER — Other Ambulatory Visit: Payer: Self-pay | Admitting: Cardiology

## 2014-10-16 NOTE — Telephone Encounter (Signed)
Per note 3.23.16

## 2014-11-17 ENCOUNTER — Other Ambulatory Visit: Payer: Self-pay | Admitting: Cardiology

## 2014-12-03 ENCOUNTER — Ambulatory Visit: Payer: Commercial Managed Care - PPO | Admitting: Internal Medicine

## 2014-12-03 DIAGNOSIS — Z0289 Encounter for other administrative examinations: Secondary | ICD-10-CM

## 2014-12-28 ENCOUNTER — Other Ambulatory Visit: Payer: Self-pay | Admitting: Internal Medicine

## 2015-02-02 ENCOUNTER — Other Ambulatory Visit: Payer: Self-pay | Admitting: Internal Medicine

## 2015-02-04 ENCOUNTER — Telehealth: Payer: Self-pay | Admitting: *Deleted

## 2015-02-04 ENCOUNTER — Other Ambulatory Visit: Payer: Self-pay | Admitting: *Deleted

## 2015-02-04 MED ORDER — CARVEDILOL 25 MG PO TABS: 50.0000 mg | ORAL_TABLET | Freq: Two times a day (BID) | ORAL | Status: DC

## 2015-02-04 NOTE — Telephone Encounter (Signed)
**Note De-Identified Marshawn Normoyle Obfuscation** Yes please refill. Thanks.

## 2015-02-04 NOTE — Telephone Encounter (Signed)
Patient called for a refill on carvedilol. She stated that her pcp has been refilling this, but would like Dr Ron Parker to approve future refills. Ok to refill? Please advise. Thanks, MI

## 2015-02-05 NOTE — Telephone Encounter (Signed)
Yes,  refill

## 2015-03-30 ENCOUNTER — Other Ambulatory Visit: Payer: Self-pay | Admitting: Internal Medicine

## 2015-05-05 ENCOUNTER — Other Ambulatory Visit: Payer: Self-pay | Admitting: Cardiology

## 2015-05-22 ENCOUNTER — Encounter: Payer: Self-pay | Admitting: Cardiology

## 2015-05-22 ENCOUNTER — Other Ambulatory Visit: Payer: Self-pay | Admitting: Cardiology

## 2015-06-08 DIAGNOSIS — F3131 Bipolar disorder, current episode depressed, mild: Secondary | ICD-10-CM | POA: Diagnosis not present

## 2015-06-18 ENCOUNTER — Ambulatory Visit: Payer: Commercial Managed Care - PPO | Admitting: Cardiovascular Disease

## 2015-07-06 DIAGNOSIS — F54 Psychological and behavioral factors associated with disorders or diseases classified elsewhere: Secondary | ICD-10-CM | POA: Diagnosis not present

## 2015-07-06 DIAGNOSIS — R635 Abnormal weight gain: Secondary | ICD-10-CM | POA: Diagnosis not present

## 2015-07-06 DIAGNOSIS — E6609 Other obesity due to excess calories: Secondary | ICD-10-CM | POA: Diagnosis not present

## 2015-07-06 DIAGNOSIS — F4329 Adjustment disorder with other symptoms: Secondary | ICD-10-CM | POA: Diagnosis not present

## 2015-07-08 DIAGNOSIS — E8881 Metabolic syndrome: Secondary | ICD-10-CM | POA: Diagnosis not present

## 2015-07-08 DIAGNOSIS — I429 Cardiomyopathy, unspecified: Secondary | ICD-10-CM | POA: Diagnosis not present

## 2015-07-08 DIAGNOSIS — R7303 Prediabetes: Secondary | ICD-10-CM | POA: Diagnosis not present

## 2015-07-08 DIAGNOSIS — R635 Abnormal weight gain: Secondary | ICD-10-CM | POA: Diagnosis not present

## 2015-07-20 ENCOUNTER — Ambulatory Visit: Payer: Commercial Managed Care - PPO | Admitting: Cardiovascular Disease

## 2015-07-20 ENCOUNTER — Other Ambulatory Visit: Payer: Self-pay | Admitting: Internal Medicine

## 2015-07-20 NOTE — Progress Notes (Signed)
Cardiology Office Note   Date:  07/21/2015   ID:  Carolyn Salazar, DOB [birth year], MRN [id number]  PCP:  No PCP Per Patient  Cardiologist:   Sharol Harness, MD   Chief Complaint  Patient presents with  . Annual Exam    no chest pain, no shortness of breath, no edema, no pain or cramping in legs,no lightheadedness or dizziness      History of Present Illness: Carolyn Salazar is a 45 y.o. 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  Carolyn Salazar is a patient who previously saw Dr. Ron Parker.  She was diagnosed with systolic heart failure, LVEF 20% in 2000.  On repeat echo in 2014 revealed LVEF 50-55% and normal diastolic function.   Carolyn Salazar reports undergoing cardiac cath at the [facility] in 2000 that was negative for ischemia.  She later had stress testing in 2007 that was negative for ischemia, though none recently.  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    Carolyn Salazar notes tightness in her chest with exertion after 20 minutes of walking.  This has been ongoing for the last year.  It is alleviated with rest.  There is associated shortness of breath but no nausea, vomiting or diaphoresis.  The pain is 3-4/10 in severity, in the center of her chest and does not radiate.  She has been trying to increase her exercise but is limited by this pain.  She was seen in weight loss clinic at [facility] recently.  She is changing her diet by avoiding carbs and eating 1300 calories daily.  She recently discovered that her Hgb A1c is 6.3%.  She denies any lower extremity edema, orthopnea or PND.   Past Medical History  Diagnosis Date  . BUNIONS, BILATERAL   . CARDIOMYOPATHY 02/1999    EF 20% in 02/1999, improved over time. Echo 01/2006 - EF 50-55% EF 04/2008 35-40% EF 40-45% Echo 07/22/2009  . MIGRAINE  HEADACHE   . DEPRESSION   . OBESITY   . 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 DYSLIPIDEMIA   . DIABETES MELLITUS, TYPE II, CONTROLLED, MILD   . BENIGN NEOPLASM OF SKIN SITE UNSPECIFIED   . Arthritis     Hands, Knees RT>LT  . Chest pain     Nuclear, 2007, normal ( catheterization in 2000, normal coronary arteries)  . Sinus tachycardia ([place])   . Hypotension     November, 2012  . Shingles (herpes zoster) polyneuropathy   . Ejection fraction     EF 20%, echo, 2000  //   EF 50%, echo, 2007  //   EF 35-40%, echo, 2009  //   EF 40-45%, echo, 2011  //   EF 40-45%, echo, December, 2012    Past Surgical History  Procedure Laterality Date  . Cardiac catheterization    . Heart biopsy    . Vein scope    . Bunionectomy  2012    LEFT  . Bunionectomy Right      Current Outpatient Prescriptions  Medication Sig Dispense Refill  . ARIPiprazole (ABILIFY) 5 MG tablet Take 7.5 mg by mouth daily. Take 1 and a half tabs by mouth daily    . carvedilol (COREG) 25 MG tablet Take 1 tablet (25 mg total) by mouth 2 (two) times daily with a  meal. 180 tablet 3  . cetirizine (ZYRTEC) 10 MG tablet Take 10 mg by mouth daily.      . clobetasol cream (TEMOVATE) 8.45 % Apply 1 application topically 2 (two) times daily. Apply twice a day  first week then three times a  Week the second week then once a week thereafter PRN 30 g 3  . DIGOX 125 MCG tablet TAKE 1 TABLET BY MOUTH DAILY 30 tablet 10  . Esomeprazole Magnesium (NEXIUM PO) Take by mouth daily. Reported on 07/21/2015    . FIBER SELECT GUMMIES PO Take 2 tablets by mouth every morning.    Marland Kitchen FLUoxetine (PROZAC) 40 MG capsule Take 1 capsule (40 mg total) by mouth daily. 90 capsule 3  . furosemide (LASIX) 40 MG tablet TAKE 1 TABLET BY MOUTH DAILY 90 tablet 0  . Glucosamine-Chondroitin (GLUCOSAMINE CHONDR COMPLEX) 500-400 MG CAPS Take by mouth daily.    . IRON PO Take 45 mg by mouth daily.     Marland Kitchen lamoTRIgine (LAMICTAL) 200 MG tablet  Take 400 mg by mouth daily.     Marland Kitchen lisinopril (PRINIVIL,ZESTRIL) 2.5 MG tablet TAKE 1 TABLET BY MOUTH DAILY 30 tablet 10  . potassium chloride SA (K-DUR,KLOR-CON) 20 MEQ tablet TAKE 1 TABLET BY MOUTH DAILY 90 tablet 3  . simvastatin (ZOCOR) 20 MG tablet Take 1 tablet (20 mg total) by mouth every evening. Appt needed for additional refills. Must est with new PCP. 90 tablet 0  . zolpidem (AMBIEN) 10 MG tablet Take 10 mg by mouth at bedtime as needed.    . multivitamin-iron-minerals-folic acid (CENTRUM) chewable tablet Chew 2 tablets by mouth daily.     Current Facility-Administered Medications  Medication Dose Route Frequency Provider Last Rate Last Dose  . levonorgestrel (MIRENA) 20 MCG/24HR IUD   Intrauterine Once Terrance Mass, MD        Allergies:   Sulfonamide derivatives    Social History:  The patient  reports that she has never smoked. She has never used smokeless tobacco. She reports that she drinks about 2.4 oz of alcohol per week. She reports that she does not use illicit drugs.   Family History:  The patient's family history includes Arthritis in her other and sister; Cancer in her mother; Diabetes in her father and mother; Diabetes type II in her mother; Heart attack in her father; Heart failure in her father; Hyperlipidemia in her mother and other; Hypertension in her other.    ROS:  Please see the history of present illness.   Otherwise, review of systems are positive for none.   All other systems are reviewed and negative.    PHYSICAL EXAM: VS:  BP 96/70 mmHg  Pulse 90  Ht 5' 6.25" (1.683 m)  Wt 113.569 kg (250 lb 6 oz)  BMI 40.10 kg/m2 , BMI Body mass index is 40.1 kg/(m^2). GENERAL:  Well appearing HEENT:  Pupils equal round and reactive, fundi not visualized, oral mucosa unremarkable NECK:  No jugular venous distention, waveform within normal limits, carotid upstroke brisk and symmetric, no bruits, no thyromegaly LYMPHATICS:  No cervical adenopathy LUNGS:  Clear to  auscultation bilaterally HEART:  RRR.  PMI not displaced or sustained,S1 and S2 within normal limits, no S3, no S4, no clicks, no rubs, no murmurs ABD:  Flat, positive bowel sounds normal in frequency in pitch, no bruits, no rebound, no guarding, no midline pulsatile mass, no hepatomegaly, no splenomegaly EXT:  2 plus pulses throughout, no edema, no cyanosis no clubbing SKIN:  No rashes no nodules NEURO:  Cranial nerves II through XII grossly intact, motor grossly intact throughout PSYCH:  Cognitively intact, oriented to person place and time    EKG:  EKG is ordered today. The ekg ordered today demonstrates sinus rhythm rate 90 bpm.  Early R wave progression,  Cannot rule out prior inferior infarct.  Non-specific ST-T changes.   Recent Labs: 08/12/2014: BUN 10; Creatinine, Ser 0.71; Potassium 4.0; Sodium 137 09/01/2014: Hemoglobin 13.1; Platelets 329    Lipid Panel    Component Value Date/Time   CHOL 202* 01/15/2014 1209   TRIG 338.0* 01/15/2014 1209   HDL 40.20 01/15/2014 1209   CHOLHDL 5 01/15/2014 1209   VLDL 67.6* 01/15/2014 1209   LDLDIRECT 130.8 01/15/2014 1209   Chol 163, tri 247, hdl 44, ldl 91    Wt Readings from Last 3 Encounters:  07/21/15 113.569 kg (250 lb 6 oz)  09/01/14 111.131 kg (245 lb)  08/12/14 110.224 kg (243 lb)      ASSESSMENT AND PLAN:  # Exertional chest pain: Carolyn Salazar's chest pain is concerning for angina.  She had a negative cath in 2000, but has several risk factors including hypertension, hyperlipidemia, morbid obesity and family history of premature CAD.  We will obtain a [employer], as she is unsure whether she will be able to walk on the treadmill.  # Chronic systolic heart failure, LVEF improved: This has resolved. She is euvolemic on exam.  # Hypertension: BP is too low.  This may be contributing to her symptoms.  We will reduce her carvedilol to 25 mg bid.  Continue lisinopril 2.5 mg daily.  # Tachycardia: Stable.  She  reports a history of sinus tachycardia in the past.  Reduce carvedilol as above given her hypotension.   # Hyperlipidemia: LDL 91 when checked at [facility] this month.  Continue simvastatin.  Her triglycerides were high, but this is likely to improve with the absence of carbohydrates from her diet.   # Morbid obesity: She is enrolled in a weight loss program at [facility] and following a strict diet.  We will obtain a stress test prior to making exercise recommendations.    Current medicines are reviewed at length with the patient today.  The patient does not have concerns regarding medicines.  The following changes have been made:  Reduce carvedilol to 25 mg bid.  Labs/ tests ordered today include:   Orders Placed This Encounter  Procedures  . T4, free  . TSH  . Myocardial Perfusion Imaging  . EKG 12-Lead     Disposition:   FU with Graycen Degan C. Oval Linsey, MD, [facility] in 1 month.    This note was written with the assistance of speech recognition software.  Please excuse any transcriptional errors.  Signed, Ayat Drenning C. Oval Linsey, MD, [facility]  07/21/2015 9:40 AM    [facility]

## 2015-07-21 ENCOUNTER — Encounter: Payer: Self-pay | Admitting: Cardiovascular Disease

## 2015-07-21 ENCOUNTER — Ambulatory Visit (INDEPENDENT_AMBULATORY_CARE_PROVIDER_SITE_OTHER): Payer: Commercial Managed Care - PPO | Admitting: Cardiovascular Disease

## 2015-07-21 VITALS — BP 96/70 | HR 90 | Ht 66.25 in | Wt 250.4 lb

## 2015-07-21 DIAGNOSIS — R079 Chest pain, unspecified: Secondary | ICD-10-CM | POA: Diagnosis not present

## 2015-07-21 DIAGNOSIS — R635 Abnormal weight gain: Secondary | ICD-10-CM

## 2015-07-21 DIAGNOSIS — E669 Obesity, unspecified: Secondary | ICD-10-CM

## 2015-07-21 DIAGNOSIS — R0602 Shortness of breath: Secondary | ICD-10-CM

## 2015-07-21 DIAGNOSIS — E785 Hyperlipidemia, unspecified: Secondary | ICD-10-CM

## 2015-07-21 DIAGNOSIS — R5383 Other fatigue: Secondary | ICD-10-CM

## 2015-07-21 DIAGNOSIS — I5042 Chronic combined systolic (congestive) and diastolic (congestive) heart failure: Secondary | ICD-10-CM

## 2015-07-21 MED ORDER — CARVEDILOL 25 MG PO TABS: 25.0000 mg | ORAL_TABLET | Freq: Two times a day (BID) | ORAL | Status: DC

## 2015-07-21 NOTE — Patient Instructions (Addendum)
Please call if you notice that your blood pressure is >140/90 or if your heart rate is >100 bpm.  Medication Instructions: Dr Oval Linsey has recommended making the following medication changes: DECREASE Carvedilol to 25 mg twice daily  >>A new prescription has been sent to your pharmacy electronically  Labwork: Your physician recommends that you return for lab work TODAY.  Testing/Procedures: 1. Lexiscan Cardiolite stress test - Your physician has requested that you have a lexiscan myoview. For further information please visit [email or web address]. Please follow instruction sheet, as given.  Follow-up: Dr Oval Linsey recommends that you schedule a follow-up appointment in 1 month.  If you need a refill on your cardiac medications before your next appointment, please call your pharmacy.

## 2015-07-22 LAB — TSH: TSH: 2.06 m[IU]/L

## 2015-07-22 LAB — T4, FREE: Free T4: 1.2 ng/dL (ref 0.8–1.8)

## 2015-07-23 ENCOUNTER — Other Ambulatory Visit: Payer: Self-pay

## 2015-07-23 MED ORDER — SIMVASTATIN 20 MG PO TABS: 20.0000 mg | ORAL_TABLET | Freq: Every evening | ORAL | Status: DC

## 2015-07-26 ENCOUNTER — Emergency Department (INDEPENDENT_AMBULATORY_CARE_PROVIDER_SITE_OTHER)
Admission: EM | Admit: 2015-07-26 | Discharge: 2015-07-26 | Disposition: A | Discharge status: Home / Self Care | Payer: Commercial Managed Care - PPO | Source: Home / Self Care | Attending: Family Medicine | Admitting: Family Medicine

## 2015-07-26 ENCOUNTER — Encounter: Payer: Self-pay | Admitting: Emergency Medicine

## 2015-07-26 DIAGNOSIS — J069 Acute upper respiratory infection, unspecified: Secondary | ICD-10-CM | POA: Diagnosis not present

## 2015-07-26 DIAGNOSIS — H65192 Other acute nonsuppurative otitis media, left ear: Secondary | ICD-10-CM

## 2015-07-26 MED ORDER — FLUTICASONE PROPIONATE 50 MCG/ACT NA SUSP: 2.0000 | Freq: Every day | NASAL | Status: DC

## 2015-07-26 MED ORDER — PREDNISONE 20 MG PO TABS: ORAL_TABLET | ORAL | Status: DC

## 2015-07-26 MED ORDER — BENZONATATE 100 MG PO CAPS: 100.0000 mg | ORAL_CAPSULE | Freq: Three times a day (TID) | ORAL | Status: DC

## 2015-07-26 MED ORDER — AMOXICILLIN-POT CLAVULANATE 875-125 MG PO TABS: 1.0000 | ORAL_TABLET | Freq: Two times a day (BID) | ORAL | Status: DC

## 2015-07-26 NOTE — ED Provider Notes (Signed)
CSN: [id number]     Arrival date & time 07/26/15  0849 History   First MD Initiated Contact with Patient 07/26/15 [phone]     Chief Complaint  Patient presents with  . Sinus Problem   (Consider location/radiation/quality/duration/timing/severity/associated sxs/prior Treatment) HPI The pt is a 45yo female presenting to [facility] with c/o gradually worsening sinus symptoms and URI symptoms including body aches, subjective fever, sinus congestion and pressure with bilateral ear pain, mild to moderately intermittent productive cough that is worse at night.  Symptoms started 2 days ago.  Minimal relief with sudafed and acetaminophen but no medications taken today.  Denies sick contacts or recent travel. Denies chest pain or SOB. No hx of asthma.   Past Medical History  Diagnosis Date  . BUNIONS, BILATERAL   . CARDIOMYOPATHY 02/1999    EF 20% in 02/1999, improved over time. Echo 01/2006 - EF 50-55% EF 04/2008 35-40% EF 40-45% Echo 07/22/2009  . MIGRAINE HEADACHE   . DEPRESSION   . OBESITY   . 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 DYSLIPIDEMIA   . DIABETES MELLITUS, TYPE II, CONTROLLED, MILD   . BENIGN NEOPLASM OF SKIN SITE UNSPECIFIED   . Arthritis     Hands, Knees RT>LT  . Chest pain     Nuclear, 2007, normal ( catheterization in 2000, normal coronary arteries)  . Sinus tachycardia ([place])   . Hypotension     November, 2012  . Shingles (herpes zoster) polyneuropathy   . Ejection fraction     EF 20%, echo, 2000  //   EF 50%, echo, 2007  //   EF 35-40%, echo, 2009  //   EF 40-45%, echo, 2011  //   EF 40-45%, echo, December, 2012   Past Surgical History  Procedure Laterality Date  . Cardiac catheterization    . Heart biopsy    . Vein scope    . Bunionectomy  2012    LEFT  . Bunionectomy Right    Family History  Problem Relation Age of Onset  . Diabetes Mother   . Cancer Mother     Ovarian, benign mass  . Diabetes type II Mother   . Hyperlipidemia Mother    . Diabetes Father   . Heart failure Father   . Heart attack Father   . Hypertension Other     Parent  . Hyperlipidemia Other     parent  . Arthritis Other     parent, grandparent  . Arthritis Sister    Social History  Substance Use Topics  . Smoking status: Never Smoker   . Smokeless tobacco: Never Used  . Alcohol Use: 2.4 oz/week    4 Glasses of wine per week   OB History    Gravida Para Term Preterm AB TAB SAB Ectopic Multiple Living   1 1 1       1      Review of Systems  Constitutional: Positive for fever, chills and fatigue.  HENT: Positive for congestion, ear pain, postnasal drip, rhinorrhea, sinus pressure, sore throat and voice change. Negative for sneezing and trouble swallowing.   Respiratory: Positive for cough. Negative for shortness of breath.   Cardiovascular: Negative for chest pain and palpitations.  Gastrointestinal: Negative for nausea, vomiting, abdominal pain and diarrhea.  Musculoskeletal: Positive for myalgias and arthralgias. Negative for back pain.  Skin: Negative for rash.  Neurological: Positive for headaches. Negative for dizziness and light-headedness.    Allergies  Sulfonamide derivatives  Home  Medications   Prior to Admission medications   Medication Sig Start Date End Date Taking? Authorizing Provider  amoxicillin-clavulanate (AUGMENTIN) 875-125 MG tablet Take 1 tablet by mouth 2 (two) times daily. One po bid x 7 days 07/26/15   Noland Fordyce, PA-C  ARIPiprazole (ABILIFY) 5 MG tablet Take 7.5 mg by mouth daily. Take 1 and a half tabs by mouth daily    Historical Provider, MD  benzonatate (TESSALON) 100 MG capsule Take 1-2 capsules (100-200 mg total) by mouth every 8 (eight) hours. 07/26/15   Noland Fordyce, PA-C  carvedilol (COREG) 25 MG tablet Take 1 tablet (25 mg total) by mouth 2 (two) times daily with a meal. 07/21/15   Skeet Latch, MD  cetirizine (ZYRTEC) 10 MG tablet Take 10 mg by mouth daily.      Historical Provider, MD  clobetasol  cream (TEMOVATE) 0.25 % Apply 1 application topically 2 (two) times daily. Apply twice a day  first week then three times a  Week the second week then once a week thereafter PRN 09/01/14   Terrance Mass, MD  [place] 125 MCG tablet TAKE 1 TABLET BY MOUTH DAILY 10/16/14   Carlena Bjornstad, MD  Esomeprazole Magnesium (NEXIUM PO) Take by mouth daily. Reported on 07/21/2015    Historical Provider, MD  [place] Take 2 tablets by mouth every morning.    Historical Provider, MD  FLUoxetine (PROZAC) 40 MG capsule Take 1 capsule (40 mg total) by mouth daily. 01/15/14   Rowe Clack, MD  fluticasone (FLONASE) 50 MCG/ACT nasal spray Place 2 sprays into both nostrils daily. For at least 2 weeks or seasonally 07/26/15   Noland Fordyce, PA-C  furosemide (LASIX) 40 MG tablet TAKE 1 TABLET BY MOUTH DAILY 05/25/15   Carlena Bjornstad, MD  Glucosamine-Chondroitin (GLUCOSAMINE CHONDR COMPLEX) 500-400 MG CAPS Take by mouth daily.    Historical Provider, MD  IRON PO Take 45 mg by mouth daily.     Historical Provider, MD  lamoTRIgine (LAMICTAL) 200 MG tablet Take 400 mg by mouth daily.     Historical Provider, MD  lisinopril (PRINIVIL,ZESTRIL) 2.5 MG tablet TAKE 1 TABLET BY MOUTH DAILY 09/11/14   Carlena Bjornstad, MD  multivitamin-iron-minerals-folic acid (CENTRUM) chewable tablet Chew 2 tablets by mouth daily. 07/10/11 08/12/14  Acquanetta Chain, DO  potassium chloride SA (K-DUR,KLOR-CON) 20 MEQ tablet TAKE 1 TABLET BY MOUTH DAILY 10/14/14   Carlena Bjornstad, MD  predniSONE (DELTASONE) 20 MG tablet 2 tabs po daily x 3 days 07/26/15   Noland Fordyce, PA-C  simvastatin (ZOCOR) 20 MG tablet Take 1 tablet (20 mg total) by mouth every evening. 07/23/15   Skeet Latch, MD  zolpidem (AMBIEN) 10 MG tablet Take 10 mg by mouth at bedtime as needed.    Historical Provider, MD   Meds Ordered and Administered this Visit  Medications - No data to display  BP 101/65 mmHg  Pulse 91  Temp(Src) 98 F (36.7 C) (Oral)  Ht 5' 6"   (1.676 m)  Wt 240 lb (108.863 kg)  BMI 38.76 kg/m2  SpO2 97% No data found.   Physical Exam  Constitutional: She appears well-developed and well-nourished. No distress.  HENT:  Head: Normocephalic and atraumatic.  Right Ear: Tympanic membrane normal.  Left Ear: No tenderness. Tympanic membrane is erythematous. Tympanic membrane is not bulging.  No middle ear effusion.  Nose: Mucosal edema and rhinorrhea present. Right sinus exhibits maxillary sinus tenderness and frontal sinus tenderness. Left sinus exhibits  maxillary sinus tenderness and frontal sinus tenderness.  Mouth/Throat: Uvula is midline, oropharynx is clear and moist and mucous membranes are normal.  Eyes: Conjunctivae are normal. No scleral icterus.  Neck: Normal range of motion. Neck supple.  Cardiovascular: Normal rate, regular rhythm and normal heart sounds.   Pulmonary/Chest: Effort normal. No stridor. No respiratory distress. She has no wheezes. She has rhonchi in the right upper field and the left upper field. She has no rales.  Faint diffuse rhonchi in upper lung fields. No wheeze. No respiratory distress.  Abdominal: Soft. She exhibits no distension. There is no tenderness.  Musculoskeletal: Normal range of motion.  Lymphadenopathy:    She has no cervical adenopathy.  Neurological: She is alert.  Skin: Skin is warm and dry. She is not diaphoretic.  Nursing note and vitals reviewed.   ED Course  Procedures (including critical care time)  Labs Review Labs Reviewed - No data to display  Imaging Review No results found.  Tympanometry: Right ear- Normal,  Left ear- Positive Peak Pressure   MDM   1. Acute nonsuppurative otitis media of left ear   2. Acute upper respiratory infection    Pt c/o sinus symptoms and upper respiratory symptoms that have worsened over 2 days. Subjective fever.  Faint rhonchi in upper lung fields but no respiratory distress.  Left TM- erythematous with Positive Peak Pressure on  tympanometry. Will tx for AOM  Rx: Augmentin, Tessalon, Prednisone and Flonase   Advised pt to use acetaminophen and ibuprofen as needed for fever and pain. Encouraged rest and fluids. F/u with PCP in 7-10 days if not improving, sooner if worsening. Pt verbalized understanding and agreement with tx plan.     Noland Fordyce, PA-C 07/26/15 1016

## 2015-07-26 NOTE — ED Notes (Signed)
Sinus congestion, productive cough x 3 days

## 2015-07-26 NOTE — Discharge Instructions (Signed)
You may take 400-600mg Ibuprofen (Motrin) every 6-8 hours for fever and pain  °Alternate with Tylenol  °You may take 500mg Tylenol every 4-6 hours as needed for fever and pain  °Follow-up with your primary care provider next week for recheck of symptoms if not improving.  °Be sure to drink plenty of fluids and rest, at least 8hrs of sleep a night, preferably more while you are sick. °Return urgent care or go to closest ER if you cannot keep down fluids/signs of dehydration, fever not reducing with Tylenol, difficulty breathing/wheezing, stiff neck, worsening condition, or other concerns (see below)  °Please take antibiotics as prescribed and be sure to complete entire course even if you start to feel better to ensure infection does not come back. ° ° °Cool Mist Vaporizers °Vaporizers may help relieve the symptoms of a cough and cold. They add moisture to the air, which helps mucus to become thinner and less sticky. This makes it easier to breathe and cough up secretions. Cool mist vaporizers do not cause serious burns like hot mist vaporizers, which may also be called steamers or humidifiers. Vaporizers have not been proven to help with colds. You should not use a vaporizer if you are allergic to mold. °HOME CARE INSTRUCTIONS °· Follow the package instructions for the vaporizer. °· Do not use anything other than distilled water in the vaporizer. °· Do not run the vaporizer all of the time. This can cause mold or bacteria to grow in the vaporizer. °· Clean the vaporizer after each time it is used. °· Clean and dry the vaporizer well before storing it. °· Stop using the vaporizer if worsening respiratory symptoms develop. °  °This information is not intended to replace advice given to you by your health care provider. Make sure you discuss any questions you have with your health care provider. °  °Document Released: 02/03/2004 Document Revised: 05/13/2013 Document Reviewed: 09/25/2012 °Elsevier Interactive Patient  Education ©2016 [employer] ° °

## 2015-07-27 DIAGNOSIS — F3131 Bipolar disorder, current episode depressed, mild: Secondary | ICD-10-CM | POA: Diagnosis not present

## 2015-07-30 ENCOUNTER — Telehealth (HOSPITAL_COMMUNITY): Payer: Self-pay

## 2015-07-30 NOTE — Telephone Encounter (Signed)
Encounter complete. 

## 2015-08-03 ENCOUNTER — Telehealth (HOSPITAL_COMMUNITY): Payer: Self-pay

## 2015-08-03 DIAGNOSIS — I429 Cardiomyopathy, unspecified: Secondary | ICD-10-CM | POA: Diagnosis not present

## 2015-08-03 DIAGNOSIS — E8881 Metabolic syndrome: Secondary | ICD-10-CM | POA: Diagnosis not present

## 2015-08-03 DIAGNOSIS — E669 Obesity, unspecified: Secondary | ICD-10-CM | POA: Diagnosis not present

## 2015-08-03 DIAGNOSIS — Z6839 Body mass index (BMI) 39.0-39.9, adult: Secondary | ICD-10-CM | POA: Diagnosis not present

## 2015-08-03 DIAGNOSIS — R635 Abnormal weight gain: Secondary | ICD-10-CM | POA: Diagnosis not present

## 2015-08-03 DIAGNOSIS — R7303 Prediabetes: Secondary | ICD-10-CM | POA: Diagnosis not present

## 2015-08-03 NOTE — Telephone Encounter (Signed)
Encounter complete. 

## 2015-08-04 ENCOUNTER — Ambulatory Visit (HOSPITAL_COMMUNITY)
Admission: RE | Admit: 2015-08-04 | Discharge: 2015-08-04 | Disposition: A | Discharge status: Home / Self Care | Payer: Commercial Managed Care - PPO | Source: Ambulatory Visit | Attending: Internal Medicine | Admitting: Internal Medicine

## 2015-08-04 DIAGNOSIS — R0602 Shortness of breath: Secondary | ICD-10-CM | POA: Diagnosis not present

## 2015-08-04 DIAGNOSIS — I10 Essential (primary) hypertension: Secondary | ICD-10-CM | POA: Insufficient documentation

## 2015-08-04 DIAGNOSIS — E669 Obesity, unspecified: Secondary | ICD-10-CM | POA: Insufficient documentation

## 2015-08-04 DIAGNOSIS — R9439 Abnormal result of other cardiovascular function study: Secondary | ICD-10-CM | POA: Insufficient documentation

## 2015-08-04 DIAGNOSIS — R002 Palpitations: Secondary | ICD-10-CM | POA: Insufficient documentation

## 2015-08-04 DIAGNOSIS — R0609 Other forms of dyspnea: Secondary | ICD-10-CM | POA: Diagnosis not present

## 2015-08-04 DIAGNOSIS — E119 Type 2 diabetes mellitus without complications: Secondary | ICD-10-CM | POA: Diagnosis not present

## 2015-08-04 DIAGNOSIS — Z6841 Body Mass Index (BMI) 40.0 and over, adult: Secondary | ICD-10-CM | POA: Diagnosis not present

## 2015-08-04 DIAGNOSIS — Z8249 Family history of ischemic heart disease and other diseases of the circulatory system: Secondary | ICD-10-CM | POA: Insufficient documentation

## 2015-08-04 DIAGNOSIS — R079 Chest pain, unspecified: Secondary | ICD-10-CM | POA: Insufficient documentation

## 2015-08-04 DIAGNOSIS — R5383 Other fatigue: Secondary | ICD-10-CM | POA: Insufficient documentation

## 2015-08-04 MED ORDER — TECHNETIUM TC 99M SESTAMIBI GENERIC - CARDIOLITE
31.9000 | Freq: Once | INTRAVENOUS | Status: AC | PRN
  Administered 2015-08-04: 31.9 via INTRAVENOUS

## 2015-08-04 MED ORDER — REGADENOSON 0.4 MG/5ML IV SOLN
0.4000 mg | Freq: Once | INTRAVENOUS | Status: AC
  Administered 2015-08-04: 0.4 mg via INTRAVENOUS

## 2015-08-05 ENCOUNTER — Ambulatory Visit (HOSPITAL_COMMUNITY)
Admission: RE | Admit: 2015-08-05 | Discharge: 2015-08-05 | Disposition: A | Discharge status: Home / Self Care | Payer: Commercial Managed Care - PPO | Source: Ambulatory Visit | Attending: Cardiology | Admitting: Cardiology

## 2015-08-05 LAB — MYOCARDIAL PERFUSION IMAGING
CHL CUP NUCLEAR SRS: 6
CSEPPHR: 104 {beats}/min
LV dias vol: 111 mL (ref 46–106)
LV sys vol: 48 mL
Rest HR: 78 {beats}/min
SDS: 7
SSS: 13
TID: 0.87

## 2015-08-05 MED ORDER — TECHNETIUM TC 99M SESTAMIBI GENERIC - CARDIOLITE
31.0000 | Freq: Once | INTRAVENOUS | Status: AC | PRN
  Administered 2015-08-05: 31 via INTRAVENOUS

## 2015-08-18 ENCOUNTER — Other Ambulatory Visit: Payer: Self-pay | Admitting: Cardiovascular Disease

## 2015-08-18 ENCOUNTER — Encounter: Payer: Self-pay | Admitting: Cardiovascular Disease

## 2015-08-18 ENCOUNTER — Ambulatory Visit (INDEPENDENT_AMBULATORY_CARE_PROVIDER_SITE_OTHER): Payer: Commercial Managed Care - PPO | Admitting: Cardiovascular Disease

## 2015-08-18 VITALS — BP 104/78 | HR 93 | Ht 66.0 in | Wt 246.6 lb

## 2015-08-18 DIAGNOSIS — I5042 Chronic combined systolic (congestive) and diastolic (congestive) heart failure: Secondary | ICD-10-CM | POA: Diagnosis not present

## 2015-08-18 DIAGNOSIS — E785 Hyperlipidemia, unspecified: Secondary | ICD-10-CM

## 2015-08-18 DIAGNOSIS — I10 Essential (primary) hypertension: Secondary | ICD-10-CM

## 2015-08-18 DIAGNOSIS — R079 Chest pain, unspecified: Secondary | ICD-10-CM

## 2015-08-18 DIAGNOSIS — R931 Abnormal findings on diagnostic imaging of heart and coronary circulation: Secondary | ICD-10-CM | POA: Diagnosis not present

## 2015-08-18 NOTE — Progress Notes (Signed)
Cardiology Office Note   Date:  08/18/2015   ID:  Carolyn Salazar, DOB [birth year], MRN [id number]  PCP:  No PCP Per Patient  Cardiologist:   Sharol Harness, MD   Chief Complaint  Patient presents with  . Follow-up    nuc  pt c/o swelling in both hands      Patient ID: Carolyn Salazar is a 45 y.o. 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    Interval History 08/18/15: At her last appointment carvedilol was reduced due to low blood pressures. After her last appointment [name] had a Lexiscan Cardiolite that revealed normal systolic function but had a partially reversible anterior, apical and apical lateral perfusion defect. It was unclear whether this was due to anteroapical scar with lateral ischemia or shifting breast artifact.  She continues to have chest tightness with walking. There is some associated shortness of breath. She denies lower extremity edema, orthopnea, or PND. Her weight has been stable.  History of Present Illness 07/21/15:[name] is a patient who previously saw Dr. Ron Parker.  She was diagnosed with systolic heart failure, LVEF 20% in 2000.  On repeat echo in 2014 revealed LVEF 50-55% and normal diastolic function.   Ms. Reach reports undergoing cardiac cath at the [facility] in 2000 that was negative for ischemia.  She later had stress testing in 2007 that was negative for ischemia, though none recently.  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    [name] notes tightness in her chest with exertion after 20 minutes of walking.  This has been ongoing for the last year.  It is alleviated with rest.  There is associated shortness of breath but no nausea, vomiting or diaphoresis.  The pain is 3-4/10 in severity, in the center of her chest and does not radiate.  She has  been trying to increase her exercise but is limited by this pain.  She was seen in weight loss clinic at [facility] recently.  She is changing her diet by avoiding carbs and eating 1300 calories daily.  She recently discovered that her Hgb A1c is 6.3%.  She denies any lower extremity edema, orthopnea or PND.   Past Medical History  Diagnosis Date  . BUNIONS, BILATERAL   . CARDIOMYOPATHY 02/1999    EF 20% in 02/1999, improved over time. Echo 01/2006 - EF 50-55% EF 04/2008 35-40% EF 40-45% Echo 07/22/2009  . MIGRAINE HEADACHE   . DEPRESSION   . OBESITY   . 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 DYSLIPIDEMIA   . DIABETES MELLITUS, TYPE II, CONTROLLED, MILD   . BENIGN NEOPLASM OF SKIN SITE UNSPECIFIED   . Arthritis     Hands, Knees RT>LT  . Chest pain     Nuclear, 2007, normal ( catheterization in 2000, normal coronary arteries)  . Sinus tachycardia ([place])   . Hypotension     November, 2012  . Shingles (herpes zoster) polyneuropathy   . Ejection fraction     EF 20%, echo, 2000  //   EF 50%, echo, 2007  //   EF 35-40%, echo, 2009  //   EF 40-45%, echo, 2011  //   EF 40-45%, echo, December, 2012    Past Surgical History  Procedure Laterality Date  . Cardiac catheterization    .  Heart biopsy    . Vein scope    . Bunionectomy  2012    LEFT  . Bunionectomy Right      Current Outpatient Prescriptions  Medication Sig Dispense Refill  . ARIPiprazole (ABILIFY) 5 MG tablet Take 7.5 mg by mouth daily. Take 1 and a half tabs by mouth daily    . carvedilol (COREG) 25 MG tablet Take 1 tablet (25 mg total) by mouth 2 (two) times daily with a meal. 180 tablet 3  . cetirizine (ZYRTEC) 10 MG tablet Take 10 mg by mouth daily.      . clobetasol cream (TEMOVATE) 1.54 % Apply 1 application topically 2 (two) times daily. Apply twice a day  first week then three times a  Week the second week then once a week thereafter PRN 30 g 3  . [place] 125 MCG tablet TAKE 1 TABLET  BY MOUTH DAILY 30 tablet 10  . Esomeprazole Magnesium (NEXIUM PO) Take by mouth daily. Reported on 07/21/2015    . FIBER SELECT GUMMIES PO Take 2 tablets by mouth every morning.    Marland Kitchen FLUoxetine (PROZAC) 40 MG capsule Take 1 capsule (40 mg total) by mouth daily. 90 capsule 3  . furosemide (LASIX) 40 MG tablet TAKE 1 TABLET BY MOUTH DAILY 90 tablet 0  . Glucosamine-Chondroitin (GLUCOSAMINE CHONDR COMPLEX) 500-400 MG CAPS Take by mouth daily.    Marland Kitchen lamoTRIgine (LAMICTAL) 200 MG tablet Take 400 mg by mouth daily.     Marland Kitchen lisinopril (PRINIVIL,ZESTRIL) 2.5 MG tablet TAKE 1 TABLET BY MOUTH DAILY 30 tablet 10  . potassium chloride SA (K-DUR,KLOR-CON) 20 MEQ tablet TAKE 1 TABLET BY MOUTH DAILY 90 tablet 3  . simvastatin (ZOCOR) 20 MG tablet Take 1 tablet (20 mg total) by mouth every evening. 90 tablet 0  . zolpidem (AMBIEN) 10 MG tablet Take 10 mg by mouth at bedtime as needed.    . multivitamin-iron-minerals-folic acid (CENTRUM) chewable tablet Chew 2 tablets by mouth daily.     Current Facility-Administered Medications  Medication Dose Route Frequency Provider Last Rate Last Dose  . levonorgestrel (MIRENA) 20 MCG/24HR IUD   Intrauterine Once Terrance Mass, MD        Allergies:   Sulfonamide derivatives    Social History:  The patient  reports that she has never smoked. She has never used smokeless tobacco. She reports that she drinks about 2.4 oz of alcohol per week. She reports that she does not use illicit drugs.   Family History:  The patient's family history includes Arthritis in her other and sister; Cancer in her mother; Diabetes in her father and mother; Diabetes type II in her mother; Heart attack in her father; Heart failure in her father; Hyperlipidemia in her mother and other; Hypertension in her other.    ROS:  Please see the history of present illness.   Otherwise, review of systems are positive for none.   All other systems are reviewed and negative.    PHYSICAL EXAM: VS:  BP  104/78 mmHg  Pulse 93  Ht 5' 6"  (1.676 m)  Wt 111.857 kg (246 lb 9.6 oz)  BMI 39.82 kg/m2 , BMI Body mass index is 39.82 kg/(m^2). GENERAL:  Well appearing HEENT:  Pupils equal round and reactive, fundi not visualized, oral mucosa unremarkable NECK:  No jugular venous distention, waveform within normal limits, carotid upstroke brisk and symmetric, no bruits LYMPHATICS:  No cervical adenopathy LUNGS:  Clear to auscultation bilaterally HEART:  RRR.  PMI not displaced  or sustained,S1 and S2 within normal limits, no S3, no S4, no clicks, no rubs, no murmurs ABD:  Flat, positive bowel sounds normal in frequency in pitch, no bruits, no rebound, no guarding, no midline pulsatile mass, no hepatomegaly, no splenomegaly EXT:  2 plus pulses throughout, no edema, no cyanosis no clubbing SKIN:  No rashes no nodules NEURO:  Cranial nerves II through XII grossly intact, motor grossly intact throughout PSYCH:  Cognitively intact, oriented to person place and time  EKG:  EKG is ordered today. The ekg ordered today demonstrates sinus rhythm rate 90 bpm.  Early R wave progression,  Cannot rule out prior inferior infarct.  Non-specific ST-T changes.   Lexiscan Cardiolite 08/05/15:  The left ventricular ejection fraction is normal (55-65%).  Nuclear stress EF: 57%.  There was no ST segment deviation noted during stress.  No T wave inversion was noted during stress.  Defect 1: There is a large defect of moderate severity.  Findings consistent with prior myocardial infarction with peri-infarct ischemia.  This is an intermediate risk study.  Large size, moderate intensity partially reversible anterior, apical and apical lateral perfusion defect. LVEF 57% with normal wall motion. This could be consistent with anteroapical scar and lateral ischemia or shifting breast artifact. Clinical correlation advised. This is an intermediate risk study.   Recent Labs: 09/01/2014: Hemoglobin 13.1; Platelets  329 07/21/2015: TSH 2.06    Lipid Panel    Component Value Date/Time   CHOL 202* 01/15/2014 1209   TRIG 338.0* 01/15/2014 1209   HDL 40.20 01/15/2014 1209   CHOLHDL 5 01/15/2014 1209   VLDL 67.6* 01/15/2014 1209   LDLDIRECT 130.8 01/15/2014 1209   Chol 163, tri 247, hdl 44, ldl 91    Wt Readings from Last 3 Encounters:  08/18/15 111.857 kg (246 lb 9.6 oz)  08/04/15 113.399 kg (250 lb)  07/26/15 108.863 kg (240 lb)      ASSESSMENT AND PLAN:  # Exertional chest pain: # Abnormal stress test:  Ms. Servais's chest pain is concerning for angina. I independently reviewed her exercise Cardiolite and it is is concerning for prior infarct and ischemia. Although she did have a normal cardiac catheterization in 2000, this has been some time and she has new symptoms. Therefore, we will refer her for cardiac catheterization to better evaluate for ischemia. Risks and benefits of cardiac catheterization have been discussed with the patient.  The patient understands that risks included but are not limited to stroke (1 in 1000), death (1 in 95), kidney failure [usually temporary] (1 in 500), bleeding (1 in 200), allergic reaction [possibly serious] (1 in 200). The patient understands and agrees to proceed.   # Chronic systolic heart failure, LVEF improved: This has resolved. She is euvolemic on exam and LVEF was 57% on nuclear stress testing.  Continue carvedilol 25 mg twice a day, Lasix 40 mg daily, and lisinopril 2.5 mg daily.  # Hypertension:  Blood pressure is well-controlled. Continue carvedilol 25 mg bid and lisinopril 2.5 mg daily.   # Hyperlipidemia: LDL 91 when recently checked at [facility] this month.  Continue simvastatin.   # Morbid obesity: She is enrolled in a weight loss program at [facility] and following a strict diet.     Current medicines are reviewed at length with the patient today.  The patient does not have concerns regarding medicines.  The following changes have  been made:  None Labs/ tests ordered today include:   Orders Placed This Encounter  Procedures  .  PTT  . INR/PT  . Basic metabolic panel  . CBC with Differential/Platelet     Disposition:   FU with Jared Whorley C. Oval Linsey, MD, [facility] in 3 months.    This note was written with the assistance of speech recognition software.  Please excuse any transcriptional errors.  Signed, Serafin Decatur C. Oval Linsey, MD, [facility]  08/18/2015 7:30 PM    [place]

## 2015-08-18 NOTE — Patient Instructions (Addendum)
Medication Instructions:  Your physician recommends that you continue on your current medications as directed. Please refer to the Current Medication list given to you today.  Labwork: Pt/int/ptt/cbc/bmet  Testing/Procedures: Your physician has requested that you have a cardiac catheterization. Cardiac catheterization is used to diagnose and/or treat various heart conditions. Doctors may recommend this procedure for a number of different reasons. The most common reason is to evaluate chest pain. Chest pain can be a symptom of coronary artery disease (CAD), and cardiac catheterization can show whether plaque is narrowing or blocking your heart's arteries. This procedure is also used to evaluate the valves, as well as measure the blood flow and oxygen levels in different parts of your heart. For further information please visit [email or web address]. Please follow instruction sheet, as given.  Follow-Up: Your physician recommends that you schedule a follow-up appointment in: 3 month ov  If you need a refill on your cardiac medications before your next appointment, please call your pharmacy.

## 2015-08-19 LAB — CBC WITH DIFFERENTIAL/PLATELET
BASOS PCT: 0 % (ref 0–1)
Basophils Absolute: 0 10*3/uL (ref 0.0–0.1)
Eosinophils Absolute: 0.2 10*3/uL (ref 0.0–0.7)
Eosinophils Relative: 2 % (ref 0–5)
HCT: 42.7 % (ref 36.0–46.0)
HEMOGLOBIN: 14.1 g/dL (ref 12.0–15.0)
Lymphocytes Relative: 20 % (ref 12–46)
Lymphs Abs: 1.8 10*3/uL (ref 0.7–4.0)
MCH: 30.1 pg (ref 26.0–34.0)
MCHC: 33 g/dL (ref 30.0–36.0)
MCV: 91.2 fL (ref 78.0–100.0)
MPV: 10.4 fL (ref 8.6–12.4)
Monocytes Absolute: 0.5 10*3/uL (ref 0.1–1.0)
Monocytes Relative: 6 % (ref 3–12)
NEUTROS ABS: 6.4 10*3/uL (ref 1.7–7.7)
Neutrophils Relative %: 72 % (ref 43–77)
Platelets: 318 10*3/uL (ref 150–400)
RBC: 4.68 MIL/uL (ref 3.87–5.11)
RDW: 13.4 % (ref 11.5–15.5)
WBC: 8.9 10*3/uL (ref 4.0–10.5)

## 2015-08-19 LAB — BASIC METABOLIC PANEL
BUN: 14 mg/dL (ref 7–25)
CALCIUM: 9.9 mg/dL (ref 8.6–10.2)
CO2: 27 mmol/L (ref 20–31)
Chloride: 102 mmol/L (ref 98–110)
Creat: 0.76 mg/dL (ref 0.50–1.10)
Glucose, Bld: 138 mg/dL — ABNORMAL HIGH (ref 65–99)
Potassium: 4.8 mmol/L (ref 3.5–5.3)
SODIUM: 138 mmol/L (ref 135–146)

## 2015-08-19 LAB — APTT: APTT: 37 s (ref 24–37)

## 2015-08-19 LAB — PROTIME-INR
INR: 1.02 (ref <1.50)
Prothrombin Time: 13.5 seconds (ref 11.6–15.2)

## 2015-08-20 ENCOUNTER — Other Ambulatory Visit: Payer: Self-pay | Admitting: *Deleted

## 2015-08-20 DIAGNOSIS — Z01818 Encounter for other preprocedural examination: Secondary | ICD-10-CM

## 2015-08-21 ENCOUNTER — Other Ambulatory Visit: Payer: Self-pay | Admitting: Cardiology

## 2015-08-23 NOTE — Telephone Encounter (Signed)
Rx(s) sent to pharmacy electronically.  

## 2015-08-25 ENCOUNTER — Encounter (HOSPITAL_COMMUNITY): Payer: Self-pay | Admitting: Cardiovascular Disease

## 2015-08-25 ENCOUNTER — Ambulatory Visit (HOSPITAL_COMMUNITY)
Admission: RE | Admit: 2015-08-25 | Discharge: 2015-08-25 | Disposition: A | Discharge status: Home / Self Care | Payer: Commercial Managed Care - PPO | Source: Ambulatory Visit | Attending: Cardiovascular Disease | Admitting: Cardiovascular Disease

## 2015-08-25 ENCOUNTER — Encounter (HOSPITAL_COMMUNITY)
Admission: RE | Disposition: A | Discharge status: Home / Self Care | Payer: Self-pay | Source: Ambulatory Visit | Attending: Cardiovascular Disease

## 2015-08-25 DIAGNOSIS — Z8249 Family history of ischemic heart disease and other diseases of the circulatory system: Secondary | ICD-10-CM | POA: Diagnosis not present

## 2015-08-25 DIAGNOSIS — I5042 Chronic combined systolic (congestive) and diastolic (congestive) heart failure: Secondary | ICD-10-CM | POA: Insufficient documentation

## 2015-08-25 DIAGNOSIS — Z6839 Body mass index (BMI) 39.0-39.9, adult: Secondary | ICD-10-CM | POA: Insufficient documentation

## 2015-08-25 DIAGNOSIS — R931 Abnormal findings on diagnostic imaging of heart and coronary circulation: Secondary | ICD-10-CM

## 2015-08-25 DIAGNOSIS — R079 Chest pain, unspecified: Secondary | ICD-10-CM | POA: Insufficient documentation

## 2015-08-25 DIAGNOSIS — F329 Major depressive disorder, single episode, unspecified: Secondary | ICD-10-CM | POA: Insufficient documentation

## 2015-08-25 DIAGNOSIS — M1990 Unspecified osteoarthritis, unspecified site: Secondary | ICD-10-CM | POA: Diagnosis not present

## 2015-08-25 DIAGNOSIS — E119 Type 2 diabetes mellitus without complications: Secondary | ICD-10-CM | POA: Insufficient documentation

## 2015-08-25 DIAGNOSIS — E8881 Metabolic syndrome: Secondary | ICD-10-CM | POA: Diagnosis not present

## 2015-08-25 DIAGNOSIS — I110 Hypertensive heart disease with heart failure: Secondary | ICD-10-CM | POA: Diagnosis not present

## 2015-08-25 DIAGNOSIS — Z01818 Encounter for other preprocedural examination: Secondary | ICD-10-CM

## 2015-08-25 DIAGNOSIS — R9439 Abnormal result of other cardiovascular function study: Secondary | ICD-10-CM | POA: Insufficient documentation

## 2015-08-25 DIAGNOSIS — G43909 Migraine, unspecified, not intractable, without status migrainosus: Secondary | ICD-10-CM | POA: Diagnosis not present

## 2015-08-25 DIAGNOSIS — E785 Hyperlipidemia, unspecified: Secondary | ICD-10-CM | POA: Diagnosis not present

## 2015-08-25 HISTORY — PX: CARDIAC CATHETERIZATION: SHX172

## 2015-08-25 LAB — GLUCOSE, CAPILLARY: Glucose-Capillary: 141 mg/dL — ABNORMAL HIGH (ref 65–99)

## 2015-08-25 SURGERY — LEFT HEART CATH AND CORONARY ANGIOGRAPHY

## 2015-08-25 MED ORDER — FENTANYL CITRATE (PF) 100 MCG/2ML IJ SOLN
INTRAMUSCULAR | Status: DC | PRN
  Administered 2015-08-25: 50 ug via INTRAVENOUS

## 2015-08-25 MED ORDER — SODIUM CHLORIDE 0.9 % IV SOLN: INTRAVENOUS | Status: DC

## 2015-08-25 MED ORDER — FENTANYL CITRATE (PF) 100 MCG/2ML IJ SOLN
INTRAMUSCULAR | Status: AC
  Filled 2015-08-25: qty 2

## 2015-08-25 MED ORDER — ONDANSETRON HCL 4 MG/2ML IJ SOLN: 4.0000 mg | Freq: Four times a day (QID) | INTRAMUSCULAR | Status: DC | PRN

## 2015-08-25 MED ORDER — HEPARIN (PORCINE) IN NACL 2-0.9 UNIT/ML-% IJ SOLN
INTRAMUSCULAR | Status: DC | PRN
  Administered 2015-08-25: 1500 mL

## 2015-08-25 MED ORDER — ASPIRIN 81 MG PO CHEW
81.0000 mg | CHEWABLE_TABLET | ORAL | Status: AC
  Administered 2015-08-25: 81 mg via ORAL

## 2015-08-25 MED ORDER — DIAZEPAM 5 MG PO TABS
5.0000 mg | ORAL_TABLET | Freq: Once | ORAL | Status: AC
  Administered 2015-08-25: 5 mg via ORAL

## 2015-08-25 MED ORDER — SODIUM CHLORIDE 0.9 % IV SOLN
250.0000 mL | INTRAVENOUS | Status: DC | PRN
Start: 2015-08-25 — End: 2015-08-25

## 2015-08-25 MED ORDER — SODIUM CHLORIDE 0.9% FLUSH: 3.0000 mL | INTRAVENOUS | Status: DC | PRN

## 2015-08-25 MED ORDER — SODIUM CHLORIDE 0.9% FLUSH: 3.0000 mL | Freq: Two times a day (BID) | INTRAVENOUS | Status: DC

## 2015-08-25 MED ORDER — NITROGLYCERIN 1 MG/10 ML FOR IR/CATH LAB
INTRA_ARTERIAL | Status: AC
  Filled 2015-08-25: qty 10

## 2015-08-25 MED ORDER — VERAPAMIL HCL 2.5 MG/ML IV SOLN
INTRAVENOUS | Status: AC
  Filled 2015-08-25: qty 2

## 2015-08-25 MED ORDER — LIDOCAINE HCL (PF) 1 % IJ SOLN
INTRAMUSCULAR | Status: DC | PRN
  Administered 2015-08-25: 2 mL

## 2015-08-25 MED ORDER — HEPARIN (PORCINE) IN NACL 2-0.9 UNIT/ML-% IJ SOLN
INTRAMUSCULAR | Status: AC
  Filled 2015-08-25: qty 1000

## 2015-08-25 MED ORDER — LIDOCAINE HCL (PF) 1 % IJ SOLN
INTRAMUSCULAR | Status: AC
  Filled 2015-08-25: qty 30

## 2015-08-25 MED ORDER — HEPARIN SODIUM (PORCINE) 1000 UNIT/ML IJ SOLN
INTRAMUSCULAR | Status: AC
  Filled 2015-08-25: qty 1

## 2015-08-25 MED ORDER — ACETAMINOPHEN 325 MG PO TABS: 650.0000 mg | ORAL_TABLET | ORAL | Status: DC | PRN

## 2015-08-25 MED ORDER — HEPARIN SODIUM (PORCINE) 1000 UNIT/ML IJ SOLN
INTRAMUSCULAR | Status: DC | PRN
  Administered 2015-08-25: 5000 [IU] via INTRAVENOUS

## 2015-08-25 MED ORDER — VERAPAMIL HCL 2.5 MG/ML IV SOLN
INTRA_ARTERIAL | Status: DC | PRN
  Administered 2015-08-25: 15 mL via INTRA_ARTERIAL

## 2015-08-25 MED ORDER — ASPIRIN 81 MG PO CHEW
CHEWABLE_TABLET | ORAL | Status: AC
  Administered 2015-08-25: 81 mg via ORAL
  Filled 2015-08-25: qty 1

## 2015-08-25 MED ORDER — DIAZEPAM 5 MG PO TABS
ORAL_TABLET | ORAL | Status: AC
  Administered 2015-08-25: 5 mg via ORAL
  Filled 2015-08-25: qty 1

## 2015-08-25 MED ORDER — MIDAZOLAM HCL 2 MG/2ML IJ SOLN
INTRAMUSCULAR | Status: DC | PRN
  Administered 2015-08-25: 2 mg via INTRAVENOUS

## 2015-08-25 MED ORDER — MIDAZOLAM HCL 2 MG/2ML IJ SOLN
INTRAMUSCULAR | Status: AC
  Filled 2015-08-25: qty 2

## 2015-08-25 MED ORDER — IOPAMIDOL (ISOVUE-370) INJECTION 76%
INTRAVENOUS | Status: AC
  Filled 2015-08-25: qty 100

## 2015-08-25 MED ORDER — IODIXANOL 320 MG/ML IV SOLN
INTRAVENOUS | Status: DC | PRN
  Administered 2015-08-25: 60 mL via INTRAVENOUS

## 2015-08-25 MED ORDER — SODIUM CHLORIDE 0.9 % IV SOLN
INTRAVENOUS | Status: DC
  Administered 2015-08-25: 09:00:00 via INTRAVENOUS

## 2015-08-25 SURGICAL SUPPLY — 11 items
CATH INFINITI 5 FR JL3.5 (CATHETERS) ×3 IMPLANT
CATH INFINITI 5FR ANG PIGTAIL (CATHETERS) ×3 IMPLANT
CATH OPTITORQUE TIG 4.0 5F (CATHETERS) ×3 IMPLANT
DEVICE RAD COMP TR BAND LRG (VASCULAR PRODUCTS) ×3 IMPLANT
GLIDESHEATH SLEND SS 6F .021 (SHEATH) ×3 IMPLANT
KIT HEART LEFT (KITS) ×3 IMPLANT
PACK CARDIAC CATHETERIZATION (CUSTOM PROCEDURE TRAY) ×3 IMPLANT
SYR MEDRAD MARK V 150ML (SYRINGE) ×3 IMPLANT
TRANSDUCER W/STOPCOCK (MISCELLANEOUS) ×3 IMPLANT
TUBING CIL FLEX 10 FLL-RA (TUBING) ×3 IMPLANT
WIRE SAFE-T 1.5MM-J .035X260CM (WIRE) ×3 IMPLANT

## 2015-08-25 NOTE — Discharge Instructions (Signed)
Radial Site Care Refer to this sheet in the next few weeks. These instructions provide you with information about caring for yourself after your procedure. Your health care provider may also give you more specific instructions. Your treatment has been planned according to current medical practices, but problems sometimes occur. Call your health care provider if you have any problems or questions after your procedure. WHAT TO EXPECT AFTER THE PROCEDURE After your procedure, it is typical to have the following:  Bruising at the radial site that usually fades within 1-2 weeks.  Blood collecting in the tissue (hematoma) that may be painful to the touch. It should usually decrease in size and tenderness within 1-2 weeks. HOME CARE INSTRUCTIONS  Take medicines only as directed by your health care provider.  You may shower 24-48 hours after the procedure or as directed by your health care provider. Remove the bandage (dressing) and gently wash the site with plain soap and water. Pat the area dry with a clean towel. Do not rub the site, because this may cause bleeding.  Do not take baths, swim, or use a hot tub until your health care provider approves.  Check your insertion site every day for redness, swelling, or drainage.  Do not apply powder or lotion to the site.  Do not flex or bend the affected arm for 24 hours or as directed by your health care provider.  Do not push or pull heavy objects with the affected arm for 24 hours or as directed by your health care provider.  Do not lift over 10 lb (4.5 kg) for 5 days after your procedure or as directed by your health care provider.  Ask your health care provider when it is okay to:  Return to work or school.  Resume usual physical activities or sports.  Resume sexual activity.  Do not drive home if you are discharged the same day as the procedure. Have someone else drive you.  You may drive 24 hours after the procedure unless otherwise  instructed by your health care provider.  Do not operate machinery or power tools for 24 hours after the procedure.  If your procedure was done as an outpatient procedure, which means that you went home the same day as your procedure, a responsible adult should be with you for the first 24 hours after you arrive home.  Keep all follow-up visits as directed by your health care provider. This is important. SEEK MEDICAL CARE IF:  You have a fever.  You have chills.  You have increased bleeding from the radial site. Hold pressure on the site. CALL 911 SEEK IMMEDIATE MEDICAL CARE IF:  You have unusual pain at the radial site.  You have redness, warmth, or swelling at the radial site.  You have drainage (other than a small amount of blood on the dressing) from the radial site.  The radial site is bleeding, and the bleeding does not stop after 30 minutes of holding steady pressure on the site.  Your arm or hand becomes pale, cool, tingly, or numb.   This information is not intended to replace advice given to you by your health care provider. Make sure you discuss any questions you have with your health care provider.   Document Released: 06/10/2010 Document Revised: 05/29/2014 Document Reviewed: 11/24/2013 Elsevier Interactive Patient Education [employer].

## 2015-08-25 NOTE — Interval H&P Note (Signed)
Cath Lab Visit (complete for each Cath Lab visit)  Clinical Evaluation Leading to the Procedure:   ACS: No.  Non-ACS:    Anginal Classification: CCS II  Anti-ischemic medical therapy: Maximal Therapy (2 or more classes of medications)  Non-Invasive Test Results: Intermediate-risk stress test findings: cardiac mortality 1-3%/year  Prior CABG: No previous CABG      History and Physical Interval Note:  08/25/2015 9:42 AM  Carolyn Salazar  has presented today for surgery, with the diagnosis of cp/abnormal nuc  The various methods of treatment have been discussed with the patient and family. After consideration of risks, benefits and other options for treatment, the patient has consented to  Procedure(s): Left Heart Cath and Coronary Angiography (N/A) as a surgical intervention .  The patient's history has been reviewed, patient examined, no change in status, stable for surgery.  I have reviewed the patient's chart and labs.  Questions were answered to the patient's satisfaction.     Tuana Hoheisel A

## 2015-08-25 NOTE — H&P (View-Only) (Signed)
Cardiology Office Note   Date:  08/18/2015   ID:  Carolyn Salazar, DOB 23-Nov-1970, MRN [id number]  PCP:  No PCP Per Patient  Cardiologist:   Sharol Harness, MD   Chief Complaint  Patient presents with  . Follow-up    nuc  pt c/o swelling in both hands      Patient ID: Carolyn Salazar is a 45 y.o. 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    Interval History 08/18/15: At her last appointment carvedilol was reduced due to low blood pressures. After her last appointment Carolyn Salazar had a Lexiscan Cardiolite that revealed normal systolic function but had a partially reversible anterior, apical and apical lateral perfusion defect. It was unclear whether this was due to anteroapical scar with lateral ischemia or shifting breast artifact.  She continues to have chest tightness with walking. There is some associated shortness of breath. She denies lower extremity edema, orthopnea, or PND. Her weight has been stable.  History of Present Illness 07/21/15:Carolyn Salazar is a patient who previously saw Dr. Ron Salazar.  She was diagnosed with systolic heart failure, LVEF 20% in 2000.  On repeat echo in 2014 revealed LVEF 50-55% and normal diastolic function.   Carolyn Salazar reports undergoing cardiac cath at the [facility] in 2000 that was negative for ischemia.  She later had stress testing in 2007 that was negative for ischemia, though none recently.  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    Carolyn Salazar notes tightness in her chest with exertion after 20 minutes of walking.  This has been ongoing for the last year.  It is alleviated with rest.  There is associated shortness of breath but no nausea, vomiting or diaphoresis.  The pain is 3-4/10 in severity, in the center of her chest and does not radiate.  She has  been trying to increase her exercise but is limited by this pain.  She was seen in weight loss clinic at [facility] recently.  She is changing her diet by avoiding carbs and eating 1300 calories daily.  She recently discovered that her Hgb A1c is 6.3%.  She denies any lower extremity edema, orthopnea or PND.   Past Medical History  Diagnosis Date  . BUNIONS, BILATERAL   . CARDIOMYOPATHY 02/1999    EF 20% in 02/1999, improved over time. Echo 01/2006 - EF 50-55% EF 04/2008 35-40% EF 40-45% Echo 07/22/2009  . MIGRAINE HEADACHE   . DEPRESSION   . OBESITY   . 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 DYSLIPIDEMIA   . DIABETES MELLITUS, TYPE II, CONTROLLED, MILD   . BENIGN NEOPLASM OF SKIN SITE UNSPECIFIED   . Arthritis     Hands, Knees RT>LT  . Chest pain     Nuclear, 2007, normal ( catheterization in 2000, normal coronary arteries)  . Sinus tachycardia ([place])   . Hypotension     November, 2012  . Shingles (herpes zoster) polyneuropathy   . Ejection fraction     EF 20%, echo, 2000  //   EF 50%, echo, 2007  //   EF 35-40%, echo, 2009  //   EF 40-45%, echo, 2011  //   EF 40-45%, echo, December, 2012    Past Surgical History  Procedure Laterality Date  . Cardiac catheterization    .  Heart biopsy    . Vein scope    . Bunionectomy  2012    LEFT  . Bunionectomy Right      Current Outpatient Prescriptions  Medication Sig Dispense Refill  . ARIPiprazole (ABILIFY) 5 MG tablet Take 7.5 mg by mouth daily. Take 1 and a half tabs by mouth daily    . carvedilol (COREG) 25 MG tablet Take 1 tablet (25 mg total) by mouth 2 (two) times daily with a meal. 180 tablet 3  . cetirizine (ZYRTEC) 10 MG tablet Take 10 mg by mouth daily.      . clobetasol cream (TEMOVATE) 9.93 % Apply 1 application topically 2 (two) times daily. Apply twice a day  first week then three times a  Week the second week then once a week thereafter PRN 30 g 3  . DIGOX 125 MCG tablet TAKE 1 TABLET  BY MOUTH DAILY 30 tablet 10  . Esomeprazole Magnesium (NEXIUM PO) Take by mouth daily. Reported on 07/21/2015    . FIBER SELECT GUMMIES PO Take 2 tablets by mouth every morning.    Marland Kitchen FLUoxetine (PROZAC) 40 MG capsule Take 1 capsule (40 mg total) by mouth daily. 90 capsule 3  . furosemide (LASIX) 40 MG tablet TAKE 1 TABLET BY MOUTH DAILY 90 tablet 0  . Glucosamine-Chondroitin (GLUCOSAMINE CHONDR COMPLEX) 500-400 MG CAPS Take by mouth daily.    Marland Kitchen lamoTRIgine (LAMICTAL) 200 MG tablet Take 400 mg by mouth daily.     Marland Kitchen lisinopril (PRINIVIL,ZESTRIL) 2.5 MG tablet TAKE 1 TABLET BY MOUTH DAILY 30 tablet 10  . potassium chloride SA (K-DUR,KLOR-CON) 20 MEQ tablet TAKE 1 TABLET BY MOUTH DAILY 90 tablet 3  . simvastatin (ZOCOR) 20 MG tablet Take 1 tablet (20 mg total) by mouth every evening. 90 tablet 0  . zolpidem (AMBIEN) 10 MG tablet Take 10 mg by mouth at bedtime as needed.    . multivitamin-iron-minerals-folic acid (CENTRUM) chewable tablet Chew 2 tablets by mouth daily.     Current Facility-Administered Medications  Medication Dose Route Frequency Provider Last Rate Last Dose  . levonorgestrel (MIRENA) 20 MCG/24HR IUD   Intrauterine Once Terrance Mass, MD        Allergies:   Sulfonamide derivatives    Social History:  The patient  reports that she has never smoked. She has never used smokeless tobacco. She reports that she drinks about 2.4 oz of alcohol per week. She reports that she does not use illicit drugs.   Family History:  The patient's family history includes Arthritis in her other and sister; Cancer in her mother; Diabetes in her father and mother; Diabetes type II in her mother; Heart attack in her father; Heart failure in her father; Hyperlipidemia in her mother and other; Hypertension in her other.    ROS:  Please see the history of present illness.   Otherwise, review of systems are positive for none.   All other systems are reviewed and negative.    PHYSICAL EXAM: VS:  BP  104/78 mmHg  Pulse 93  Ht 5' 6"  (1.676 m)  Wt 111.857 kg (246 lb 9.6 oz)  BMI 39.82 kg/m2 , BMI Body mass index is 39.82 kg/(m^2). GENERAL:  Well appearing HEENT:  Pupils equal round and reactive, fundi not visualized, oral mucosa unremarkable NECK:  No jugular venous distention, waveform within normal limits, carotid upstroke brisk and symmetric, no bruits LYMPHATICS:  No cervical adenopathy LUNGS:  Clear to auscultation bilaterally HEART:  RRR.  PMI not displaced  or sustained,S1 and S2 within normal limits, no S3, no S4, no clicks, no rubs, no murmurs ABD:  Flat, positive bowel sounds normal in frequency in pitch, no bruits, no rebound, no guarding, no midline pulsatile mass, no hepatomegaly, no splenomegaly EXT:  2 plus pulses throughout, no edema, no cyanosis no clubbing SKIN:  No rashes no nodules NEURO:  Cranial nerves II through XII grossly intact, motor grossly intact throughout PSYCH:  Cognitively intact, oriented to person place and time  EKG:  EKG is ordered today. The ekg ordered today demonstrates sinus rhythm rate 90 bpm.  Early R wave progression,  Cannot rule out prior inferior infarct.  Non-specific ST-T changes.   Lexiscan Cardiolite 08/05/15:  The left ventricular ejection fraction is normal (55-65%).  Nuclear stress EF: 57%.  There was no ST segment deviation noted during stress.  No T wave inversion was noted during stress.  Defect 1: There is a large defect of moderate severity.  Findings consistent with prior myocardial infarction with peri-infarct ischemia.  This is an intermediate risk study.  Large size, moderate intensity partially reversible anterior, apical and apical lateral perfusion defect. LVEF 57% with normal wall motion. This could be consistent with anteroapical scar and lateral ischemia or shifting breast artifact. Clinical correlation advised. This is an intermediate risk study.   Recent Labs: 09/01/2014: Hemoglobin 13.1; Platelets  329 07/21/2015: TSH 2.06    Lipid Panel    Component Value Date/Time   CHOL 202* 01/15/2014 1209   TRIG 338.0* 01/15/2014 1209   HDL 40.20 01/15/2014 1209   CHOLHDL 5 01/15/2014 1209   VLDL 67.6* 01/15/2014 1209   LDLDIRECT 130.8 01/15/2014 1209   Chol 163, tri 247, hdl 44, ldl 91    Wt Readings from Last 3 Encounters:  08/18/15 111.857 kg (246 lb 9.6 oz)  08/04/15 113.399 kg (250 lb)  07/26/15 108.863 kg (240 lb)      ASSESSMENT AND PLAN:  # Exertional chest pain: # Abnormal stress test:  Carolyn Salazar's chest pain is concerning for angina. I independently reviewed her exercise Cardiolite and it is is concerning for prior infarct and ischemia. Although she did have a normal cardiac catheterization in 2000, this has been some time and she has new symptoms. Therefore, we will refer her for cardiac catheterization to better evaluate for ischemia. Risks and benefits of cardiac catheterization have been discussed with the patient.  The patient understands that risks included but are not limited to stroke (1 in 1000), death (1 in 29), kidney failure [usually temporary] (1 in 500), bleeding (1 in 200), allergic reaction [possibly serious] (1 in 200). The patient understands and agrees to proceed.   # Chronic systolic heart failure, LVEF improved: This has resolved. She is euvolemic on exam and LVEF was 57% on nuclear stress testing.  Continue carvedilol 25 mg twice a day, Lasix 40 mg daily, and lisinopril 2.5 mg daily.  # Hypertension:  Blood pressure is well-controlled. Continue carvedilol 25 mg bid and lisinopril 2.5 mg daily.   # Hyperlipidemia: LDL 91 when recently checked at [facility] this month.  Continue simvastatin.   # Morbid obesity: She is enrolled in a weight loss program at [facility] and following a strict diet.     Current medicines are reviewed at length with the patient today.  The patient does not have concerns regarding medicines.  The following changes have  been made:  None Labs/ tests ordered today include:   Orders Placed This Encounter  Procedures  .  PTT  . INR/PT  . Basic metabolic panel  . CBC with Differential/Platelet     Disposition:   FU with Shavaughn Seidl C. Oval Linsey, MD, [facility] in 3 months.    This note was written with the assistance of speech recognition software.  Please excuse any transcriptional errors.  Signed, Annet Manukyan C. Oval Linsey, MD, [facility]  08/18/2015 7:30 PM    [place]

## 2015-09-14 DIAGNOSIS — Z8679 Personal history of other diseases of the circulatory system: Secondary | ICD-10-CM | POA: Diagnosis not present

## 2015-09-14 DIAGNOSIS — E782 Mixed hyperlipidemia: Secondary | ICD-10-CM | POA: Diagnosis not present

## 2015-09-14 DIAGNOSIS — E6609 Other obesity due to excess calories: Secondary | ICD-10-CM | POA: Diagnosis not present

## 2015-09-14 DIAGNOSIS — R7303 Prediabetes: Secondary | ICD-10-CM | POA: Diagnosis not present

## 2015-09-14 DIAGNOSIS — R635 Abnormal weight gain: Secondary | ICD-10-CM | POA: Diagnosis not present

## 2015-10-08 ENCOUNTER — Other Ambulatory Visit: Payer: Self-pay | Admitting: Cardiology

## 2015-10-08 NOTE — Telephone Encounter (Signed)
Rx(s) sent to pharmacy electronically.  

## 2015-10-21 ENCOUNTER — Other Ambulatory Visit: Payer: Self-pay | Admitting: Cardiovascular Disease

## 2015-10-21 NOTE — Telephone Encounter (Signed)
Rx(s) sent to pharmacy electronically.  

## 2015-10-21 NOTE — Telephone Encounter (Signed)
Review for refill, Thank you.

## 2015-10-25 DIAGNOSIS — F3176 Bipolar disorder, in full remission, most recent episode depressed: Secondary | ICD-10-CM | POA: Diagnosis not present

## 2015-11-02 ENCOUNTER — Encounter: Payer: Commercial Managed Care - PPO | Admitting: Cardiovascular Disease

## 2015-11-02 ENCOUNTER — Encounter: Payer: Self-pay | Admitting: *Deleted

## 2015-11-02 NOTE — Progress Notes (Signed)
This encounter was created in error - please disregard.

## 2015-11-04 ENCOUNTER — Other Ambulatory Visit: Payer: Self-pay | Admitting: Cardiology

## 2015-11-04 NOTE — Telephone Encounter (Signed)
Rx(s) sent to pharmacy electronically.  

## 2015-11-08 ENCOUNTER — Other Ambulatory Visit: Payer: Self-pay | Admitting: Gynecology

## 2016-02-28 ENCOUNTER — Ambulatory Visit (INDEPENDENT_AMBULATORY_CARE_PROVIDER_SITE_OTHER): Payer: Commercial Managed Care - PPO | Admitting: Gynecology

## 2016-02-28 ENCOUNTER — Encounter: Payer: Self-pay | Admitting: Gynecology

## 2016-02-28 VITALS — BP 132/86

## 2016-02-28 DIAGNOSIS — N764 Abscess of vulva: Secondary | ICD-10-CM | POA: Diagnosis not present

## 2016-02-28 DIAGNOSIS — Z23 Encounter for immunization: Secondary | ICD-10-CM

## 2016-02-28 MED ORDER — DOXYCYCLINE HYCLATE 50 MG PO CAPS: ORAL_CAPSULE | ORAL | 0 refills | Status: DC

## 2016-02-28 NOTE — Patient Instructions (Signed)
Influenza Virus Vaccine (Flucelvax) What is this medicine? INFLUENZA VIRUS VACCINE (in floo EN zuh VAHY ruhs vak SEEN) helps to reduce the risk of getting influenza also known as the flu. The vaccine only helps protect you against some strains of the flu. This medicine may be used for other purposes; ask your health care provider or pharmacist if you have questions. What should I tell my health care provider before I take this medicine? They need to know if you have any of these conditions: -bleeding disorder like hemophilia -fever or infection -Guillain-Barre syndrome or other neurological problems -immune system problems -infection with the human immunodeficiency virus (HIV) or AIDS -low blood platelet counts -multiple sclerosis -an unusual or allergic reaction to influenza virus vaccine, other medicines, foods, dyes or preservatives -pregnant or trying to get pregnant -breast-feeding How should I use this medicine? This vaccine is for injection into a muscle. It is given by a health care professional. A copy of Vaccine Information Statements will be given before each vaccination. Read this sheet carefully each time. The sheet may change frequently. Talk to your pediatrician regarding the use of this medicine in children. Special care may be needed. Overdosage: If you think you've taken too much of this medicine contact a poison control center or emergency room at once. Overdosage: If you think you have taken too much of this medicine contact a poison control center or emergency room at once. NOTE: This medicine is only for you. Do not share this medicine with others. What if I miss a dose? This does not apply. What may interact with this medicine? -chemotherapy or radiation therapy -medicines that lower your immune system like etanercept, anakinra, infliximab, and adalimumab -medicines that treat or prevent blood clots like warfarin -phenytoin -steroid medicines like prednisone or  cortisone -theophylline -vaccines This list may not describe all possible interactions. Give your health care provider a list of all the medicines, herbs, non-prescription drugs, or dietary supplements you use. Also tell them if you smoke, drink alcohol, or use illegal drugs. Some items may interact with your medicine. What should I watch for while using this medicine? Report any side effects that do not go away within 3 days to your doctor or health care professional. Call your health care provider if any unusual symptoms occur within 6 weeks of receiving this vaccine. You may still catch the flu, but the illness is not usually as bad. You cannot get the flu from the vaccine. The vaccine will not protect against colds or other illnesses that may cause fever. The vaccine is needed every year. What side effects may I notice from receiving this medicine? Side effects that you should report to your doctor or health care professional as soon as possible: -allergic reactions like skin rash, itching or hives, swelling of the face, lips, or tongue Side effects that usually do not require medical attention (Report these to your doctor or health care professional if they continue or are bothersome.): -fever -headache -muscle aches and pains -pain, tenderness, redness, or swelling at the injection site -tiredness This list may not describe all possible side effects. Call your doctor for medical advice about side effects. You may report side effects to FDA at 1-800-FDA-1088. Where should I keep my medicine? The vaccine will be given by a health care professional in a clinic, pharmacy, doctor's office, or other health care setting. You will not be given vaccine doses to store at home. NOTE: This sheet is a summary. It may not cover  all possible information. If you have questions about this medicine, talk to your doctor, pharmacist, or health care provider.    2016, Elsevier/Gold Standard. (9622-29-79  89:21:19) Folliculitis Folliculitis is redness, soreness, and swelling (inflammation) of the hair follicles. This condition can occur anywhere on the body. People with weakened immune systems, diabetes, or obesity have a greater risk of getting folliculitis. CAUSES  Bacterial infection. This is the most common cause.  Fungal infection.  Viral infection.  Contact with certain chemicals, especially oils and tars. Long-term folliculitis can result from bacteria that live in the nostrils. The bacteria may trigger multiple outbreaks of folliculitis over time. SYMPTOMS Folliculitis most commonly occurs on the scalp, thighs, legs, back, buttocks, and areas where hair is shaved frequently. An early sign of folliculitis is a small, white or yellow, pus-filled, itchy lesion (pustule). These lesions appear on a red, inflamed follicle. They are usually less than 0.2 inches (5 mm) wide. When there is an infection of the follicle that goes deeper, it becomes a boil or furuncle. A group of closely packed boils creates a larger lesion (carbuncle). Carbuncles tend to occur in hairy, sweaty areas of the body. DIAGNOSIS  Your caregiver can usually tell what is wrong by doing a physical exam. A sample may be taken from one of the lesions and tested in a lab. This can help determine what is causing your folliculitis. TREATMENT  Treatment may include:  Applying warm compresses to the affected areas.  Taking antibiotic medicines orally or applying them to the skin.  Draining the lesions if they contain a large amount of pus or fluid.  Laser hair removal for cases of long-lasting folliculitis. This helps to prevent regrowth of the hair. HOME CARE INSTRUCTIONS  Apply warm compresses to the affected areas as directed by your caregiver.  If antibiotics are prescribed, take them as directed. Finish them even if you start to feel better.  You may take over-the-counter medicines to relieve itching.  Do not  shave irritated skin.  Follow up with your caregiver as directed. SEEK IMMEDIATE MEDICAL CARE IF:   You have increasing redness, swelling, or pain in the affected area.  You have a fever. MAKE SURE YOU:  Understand these instructions.  Will watch your condition.  Will get help right away if you are not doing well or get worse.   This information is not intended to replace advice given to you by your health care provider. Make sure you discuss any questions you have with your health care provider.   Document Released: 07/17/2001 Document Revised: 05/29/2014 Document Reviewed: 08/08/2011 Elsevier Interactive Patient Education [employer].

## 2016-02-28 NOTE — Addendum Note (Signed)
Addended by: Thurnell Garbe A on: 02/28/2016 04:15 PM   Modules accepted: Orders

## 2016-02-28 NOTE — Progress Notes (Signed)
   Patient is a 45 year old who presented to the office today complaining of an irritated area on her vulva which she noted a few days ago. Patient not sexually active has not been so in several years. She does have a Mirena IUD that was placed in 2013. She denies any fever, chills, nausea or any vomiting. No GU or GI complaints  Exam: Physical Exam  Genitourinary:      Patient was counseled for an incision and drainage of this suspected follicular abscess area. The area was cleansed with Betadine solution. 1% lacking was infiltrated subdermally for total 5 cc. With a sterile scalpel a small incision was made and cultures for MRSA were obtained. The loculations were broken down with a curved hemostat and the area was copiously debris. With hydrogen peroxide.  Assessment/plan: Left labia majora follicular abscess status post incision and drainage cultures for MRSA pending. Patient to remove the wick tomorrow. She was started on Vibramycin 100 mg twice a day for 2 weeks. She will also use antibacterial soap and clean the area twice a day and apply Neosporin as well. She will return to the office in 3 weeks for a postop appointment.

## 2016-02-29 ENCOUNTER — Telehealth: Payer: Self-pay

## 2016-02-29 NOTE — Telephone Encounter (Signed)
Patient called because when she picked up Rx she only had antibiotic. She thought she would have a soap and a cream. I left message in voice mail per [facility] access note on file, that Dr. Durenda Guthrie note said she would wash area with antibacterial soap bid. I explained any otc antibacterial soap is fine. Also to apply Neosporin and this is also OTC. To call if any other questions.

## 2016-03-03 LAB — WOUND CULTURE
Gram Stain: NONE SEEN
Gram Stain: NONE SEEN

## 2016-03-13 ENCOUNTER — Other Ambulatory Visit: Payer: Self-pay

## 2016-03-13 MED ORDER — LISINOPRIL 2.5 MG PO TABS: 2.5000 mg | ORAL_TABLET | Freq: Every day | ORAL | 3 refills | Status: DC

## 2016-03-13 NOTE — Telephone Encounter (Signed)
Rx has been sent to the pharmacy electronically. ° °

## 2016-03-15 ENCOUNTER — Encounter: Payer: Self-pay | Admitting: Gynecology

## 2016-03-15 ENCOUNTER — Ambulatory Visit (INDEPENDENT_AMBULATORY_CARE_PROVIDER_SITE_OTHER): Payer: Commercial Managed Care - PPO | Admitting: Gynecology

## 2016-03-15 VITALS — BP 120/78 | Ht 66.5 in | Wt 246.0 lb

## 2016-03-15 DIAGNOSIS — Z01419 Encounter for gynecological examination (general) (routine) without abnormal findings: Secondary | ICD-10-CM

## 2016-03-15 NOTE — Progress Notes (Signed)
Carolyn Salazar 01-Mar-1971 [id number]   History:    45 y.o.  for annual gyn exam who was seen in the office on October 9 and had an incision drainage of a left labial abscess whereby the culture demonstrated Staphylococcus aureus and she was treated accordingly. Since it has resolved and she had been asymptomatic. Patient has a Mirena IUD that was placed in 2013. She is currently being followed by her cardiologist Dr. Skeet Latch because of her history of nonischemic cardiomyopathy who is been doing her blood work that I noticed that she did not have a fasting blood sugar or lipid profile and she does have history of type 2 diabetes diet controlled and has mother who had diabetes as well as. Patient with no past history of any abnormal Pap smears.  Past medical history,surgical history, family history and social history were all reviewed and documented in the EPIC chart.  Gynecologic History No LMP recorded. Patient is not currently having periods (Reason: IUD). Contraception: IUD Last Pap: 2013 in 2016. Results were: normal Last mammogram: 2015. Results were: normal  Obstetric History OB History  Gravida Para Term Preterm AB Living  1 1 1     1   SAB TAB Ectopic Multiple Live Births          1    # Outcome Date GA Lbr Len/2nd Weight Sex Delivery Anes PTL Lv  1 Term     F Vag-Spont  N LIV       ROS: A ROS was performed and pertinent positives and negatives are included in the history.  GENERAL: No fevers or chills. HEENT: No change in vision, no earache, sore throat or sinus congestion. NECK: No pain or stiffness. CARDIOVASCULAR: No chest pain or pressure. No palpitations. PULMONARY: No shortness of breath, cough or wheeze. GASTROINTESTINAL: No abdominal pain, nausea, vomiting or diarrhea, melena or bright red blood per rectum. GENITOURINARY: No urinary frequency, urgency, hesitancy or dysuria. MUSCULOSKELETAL: No joint or muscle pain, no back pain, no recent trauma. DERMATOLOGIC:  No rash, no itching, no lesions. ENDOCRINE: No polyuria, polydipsia, no heat or cold intolerance. No recent change in weight. HEMATOLOGICAL: No anemia or easy bruising or bleeding. NEUROLOGIC: No headache, seizures, numbness, tingling or weakness. PSYCHIATRIC: No depression, no loss of interest in normal activity or change in sleep pattern.     Exam: chaperone present  BP 120/78   Ht 5' 6.5" (1.689 m)   Wt 246 lb (111.6 kg)   BMI 39.11 kg/m   Body mass index is 39.11 kg/m.  General appearance : Well developed well nourished female. No acute distress HEENT: Eyes: no retinal hemorrhage or exudates,  Neck supple, trachea midline, no carotid bruits, no thyroidmegaly Lungs: Clear to auscultation, no rhonchi or wheezes, or rib retractions  Heart: Regular rate and rhythm, no murmurs or gallops Breast:Examined in sitting and supine position were symmetrical in appearance, no palpable masses or tenderness,  no skin retraction, no nipple inversion, no nipple discharge, no skin discoloration, no axillary or supraclavicular lymphadenopathy Abdomen: no palpable masses or tenderness, no rebound or guarding Extremities: no edema or skin discoloration or tenderness  Pelvic:  Bartholin, Urethra, Skene Glands: Within normal limits             Vagina: No gross lesions or discharge  Cervix: No gross lesions or discharge, IUD string visualized  Uterus  anteverted, normal size, shape and consistency, non-tender and mobile  Adnexa  Without masses or tenderness  Anus and perineum  normal   Rectovaginal  normal sphincter tone without palpated masses or tenderness             Hemoccult not indicated     Assessment/Plan:  45 y.o. female for annual exam type II diabetic diet controlled does not have a PCP has only been seen by her cardiologist. She will return later this week for a fasting blood sugar and a fasting lipid profile. Her flu vaccine is up-to-date. I provided her with a requisition to schedule her  overdue mammogram.   Terrance Mass MD, 3:31 PM 03/15/2016

## 2016-03-29 ENCOUNTER — Other Ambulatory Visit: Payer: Commercial Managed Care - PPO

## 2016-03-30 LAB — LIPID PANEL
CHOLESTEROL: 179 mg/dL (ref <200)
HDL: 40 mg/dL — ABNORMAL LOW (ref >50)
LDL Cholesterol: 61 mg/dL
TRIGLYCERIDES: 391 mg/dL — AB (ref <150)
Total CHOL/HDL Ratio: 4.5 Ratio (ref <5.0)
VLDL: 78 mg/dL — AB (ref <30)

## 2016-03-30 LAB — HEMOGLOBIN A1C
Hgb A1c MFr Bld: 6.2 % — ABNORMAL HIGH (ref <5.7)
MEAN PLASMA GLUCOSE: 131 mg/dL

## 2016-03-30 LAB — GLUCOSE, RANDOM: Glucose, Bld: 151 mg/dL — ABNORMAL HIGH (ref 65–99)

## 2016-04-06 ENCOUNTER — Telehealth: Payer: Self-pay | Admitting: *Deleted

## 2016-04-06 DIAGNOSIS — E7849 Other hyperlipidemia: Secondary | ICD-10-CM

## 2016-04-06 DIAGNOSIS — R7309 Other abnormal glucose: Secondary | ICD-10-CM

## 2016-04-06 NOTE — Telephone Encounter (Signed)
Yes, any one of the following: [facility] [address] Carolyn Salazar

## 2016-04-06 NOTE — Telephone Encounter (Signed)
Pt informed with the below note, Skeet Latch is her cardiologist, pt does not have a PCP. Do you want to proceed with referring her to a PCP? Please advise

## 2016-04-06 NOTE — Telephone Encounter (Signed)
Appointment 04/10/16 @ 2:45pm with Harland Dingwall NP pt informed.

## 2016-04-06 NOTE — Telephone Encounter (Signed)
-----   Message from Terrance Mass, MD sent at 03/30/2016 10:04 AM EST ----- Please inform patient that she needs to follow-up with her PCP Skeet Latch as a result of her hyperlipidemia and her triglycerides and VLDL are highly elevated as is her blood sugar.

## 2016-04-07 ENCOUNTER — Emergency Department (INDEPENDENT_AMBULATORY_CARE_PROVIDER_SITE_OTHER)
Admission: EM | Admit: 2016-04-07 | Discharge: 2016-04-07 | Disposition: A | Discharge status: Home / Self Care | Payer: Commercial Managed Care - PPO | Source: Home / Self Care | Attending: Family Medicine | Admitting: Family Medicine

## 2016-04-07 ENCOUNTER — Encounter: Payer: Self-pay | Admitting: *Deleted

## 2016-04-07 DIAGNOSIS — H1031 Unspecified acute conjunctivitis, right eye: Secondary | ICD-10-CM

## 2016-04-07 DIAGNOSIS — H0102A Squamous blepharitis right eye, upper and lower eyelids: Secondary | ICD-10-CM

## 2016-04-07 MED ORDER — ERYTHROMYCIN 5 MG/GM OP OINT: TOPICAL_OINTMENT | OPHTHALMIC | 0 refills | Status: DC

## 2016-04-07 MED ORDER — DOXYCYCLINE HYCLATE 100 MG PO CAPS: 100.0000 mg | ORAL_CAPSULE | Freq: Two times a day (BID) | ORAL | 0 refills | Status: DC

## 2016-04-07 MED ORDER — FLUTICASONE PROPIONATE 50 MCG/ACT NA SUSP: 2.0000 | Freq: Every day | NASAL | 2 refills | Status: DC

## 2016-04-07 NOTE — ED Triage Notes (Signed)
Pt c/o RT eye pain and swelling underneath it x 2 days. No OTC meds. Reports recent URI.

## 2016-04-07 NOTE — ED Provider Notes (Signed)
CSN: [id number]     Arrival date & time 04/07/16  3545 History   First MD Initiated Contact with Patient 04/07/16 1003     Chief Complaint  Patient presents with  . Eye Problem   (Consider location/radiation/quality/duration/timing/severity/associated sxs/prior Treatment) HPI Carolyn Salazar is a 45 y.o. female presenting to UC with c/o gradually worsening Right eye pain, redness, swelling, and thick yellow drainage from tearduct.  Symptoms started about 2 days ago.  She notes she had URI symptoms last week with mild continued nasal congestion.  Pain is aching and sore, 5/10.  She has not tried any OTC medications yet.  Denies hx of similar symptoms. She does not wear contacts. Denies fever, chills, n/v/d. Denies change in vision.    Past Medical History:  Diagnosis Date  . Arthritis    Hands, Knees RT>LT  . BENIGN NEOPLASM OF SKIN SITE UNSPECIFIED   . BUNIONS, BILATERAL   . CARDIOMYOPATHY 02/1999   EF 20% in 02/1999, improved over time. Echo 01/2006 - EF 50-55% EF 04/2008 35-40% EF 40-45% Echo 07/22/2009  . Chest pain    Nuclear, 2007, normal ( catheterization in 2000, normal coronary arteries)  . DEPRESSION   . DIABETES MELLITUS, TYPE II, CONTROLLED, MILD   . DYSLIPIDEMIA   . Ejection fraction    EF 20%, echo, 2000  //   EF 50%, echo, 2007  //   EF 35-40%, echo, 2009  //   EF 40-45%, echo, 2011  //   EF 40-45%, echo, December, 2012  . Hypotension    November, 2012  . METABOLIC SYNDROME X    hypertriglycerides 04/2008, hyperglycemia  . MIGRAINE HEADACHE   . OBESITY   . Shingles (herpes zoster) polyneuropathy   . Sinus tachycardia    Past Surgical History:  Procedure Laterality Date  . BUNIONECTOMY  2012   LEFT  . BUNIONECTOMY Right   . CARDIAC CATHETERIZATION    . CARDIAC CATHETERIZATION N/A 08/25/2015   Procedure: Left Heart Cath and Coronary Angiography;  Surgeon: Troy Sine, MD;  Location: [place] [facility];  Service: Cardiovascular;  Laterality: N/A;  . heart  biopsy    . vein scope     Family History  Problem Relation Age of Onset  . Diabetes Mother   . Cancer Mother     Ovarian, benign mass  . Diabetes type II Mother   . Hyperlipidemia Mother   . Diabetes Father   . Heart failure Father   . Heart attack Father   . Hypertension Other     Parent  . Hyperlipidemia Other     parent  . Arthritis Other     parent, grandparent  . Arthritis Sister    Social History  Substance Use Topics  . Smoking status: Never Smoker  . Smokeless tobacco: Never Used  . Alcohol use 2.4 oz/week    4 Glasses of wine per week   OB History    Gravida Para Term Preterm AB Living   1 1 1     1    SAB TAB Ectopic Multiple Live Births           1     Review of Systems  Constitutional: Negative for chills and fever.  HENT: Positive for congestion, rhinorrhea, sinus pain (Right side) and sinus pressure. Negative for ear pain and sore throat.   Eyes: Positive for pain, discharge, redness and itching. Negative for photophobia and visual disturbance.  Respiratory: Negative for cough and shortness of breath.  Gastrointestinal: Negative for diarrhea, nausea and vomiting.    Allergies  Sulfonamide derivatives  Home Medications   Prior to Admission medications   Medication Sig Start Date End Date Taking? Authorizing Provider  ARIPiprazole (ABILIFY) 5 MG tablet Take 7.5 mg by mouth daily.     Historical Provider, MD  beta carotene w/minerals (OCUVITE) tablet Take 1 tablet by mouth daily.    Historical Provider, MD  carvedilol (COREG) 25 MG tablet Take 1 tablet (25 mg total) by mouth 2 (two) times daily with a meal. 07/21/15   Skeet Latch, MD  cetirizine (ZYRTEC) 10 MG tablet Take 10 mg by mouth daily.      Historical Provider, MD  clobetasol cream (TEMOVATE) 1.47 % Apply 1 application topically 2 (two) times daily. Apply twice a day  first week then three times a  Week the second week then once a week thereafter PRN 09/01/14   Terrance Mass, MD    CLOBETASOL PROPIONATE E 0.05 % emollient cream Apply 1 application topically daily. 08/21/15   Historical Provider, MD  [place] 125 MCG tablet TAKE 1 TABLET BY MOUTH DAILY 10/08/15   Skeet Latch, MD  doxycycline (VIBRAMYCIN) 100 MG capsule Take 1 capsule (100 mg total) by mouth 2 (two) times daily. One po bid x 10 days 04/07/16   Noland Fordyce, PA-C  erythromycin ophthalmic ointment Place a 1/2 inch ribbon of ointment into the lower eyelid 4 times daily for 5 days 04/07/16   Noland Fordyce, PA-C  Esomeprazole Magnesium (NEXIUM PO) Take 1 capsule by mouth daily. Reported on 07/21/2015    Historical Provider, MD  FLUoxetine (PROZAC) 40 MG capsule Take 1 capsule (40 mg total) by mouth daily. Patient taking differently: Take 80 mg by mouth daily.  01/15/14   Rowe Clack, MD  fluticasone (FLONASE) 50 MCG/ACT nasal spray Place 2 sprays into both nostrils daily. 04/07/16   Noland Fordyce, PA-C  furosemide (LASIX) 40 MG tablet TAKE 1 TABLET BY MOUTH DAILY 08/23/15   Skeet Latch, MD  Glucosamine-Chondroitin (GLUCOSAMINE CHONDR COMPLEX) 500-400 MG CAPS Take 1 tablet by mouth daily.     Historical Provider, MD  lamoTRIgine (LAMICTAL) 200 MG tablet Take 400 mg by mouth daily.     Historical Provider, MD  lisinopril (PRINIVIL,ZESTRIL) 2.5 MG tablet Take 1 tablet (2.5 mg total) by mouth daily. 03/13/16   Skeet Latch, MD  potassium chloride SA (K-DUR,KLOR-CON) 20 MEQ tablet TAKE 1 TABLET BY MOUTH DAILY 11/04/15   Skeet Latch, MD  simvastatin (ZOCOR) 20 MG tablet TAKE 1 TABLET(20 MG) BY MOUTH EVERY EVENING 10/21/15   Skeet Latch, MD  zolpidem (AMBIEN) 10 MG tablet Take 10 mg by mouth at bedtime.     Historical Provider, MD   Meds Ordered and Administered this Visit  Medications - No data to display  BP 115/70 (BP Location: Left Arm)   Pulse 93   Temp 97.9 F (36.6 C) (Oral)   Resp 18   Ht 5' 6"  (1.676 m)   Wt 243 lb (110.2 kg)   SpO2 98%   BMI 39.22 kg/m  No data found.   Physical  Exam  Constitutional: She is oriented to person, place, and time. She appears well-developed and well-nourished. No distress.  HENT:  Head: Normocephalic and atraumatic.  Right Ear: Tympanic membrane normal.  Left Ear: Tympanic membrane normal.  Nose: Mucosal edema present. Right sinus exhibits maxillary sinus tenderness. Right sinus exhibits no frontal sinus tenderness. Left sinus exhibits no maxillary sinus tenderness and no  frontal sinus tenderness.  Mouth/Throat: Uvula is midline, oropharynx is clear and moist and mucous membranes are normal.  Eyes: EOM are normal. Pupils are equal, round, and reactive to light. Right eye exhibits discharge. Left eye exhibits no discharge. Right conjunctiva is injected.    Mild edema to upper and lower eyelids with erythema. Tender. Small amount of thick yellow discharge in medial canthus.   Neck: Normal range of motion.  Cardiovascular: Normal rate.   Pulmonary/Chest: Effort normal. No respiratory distress.  Musculoskeletal: Normal range of motion.  Neurological: She is alert and oriented to person, place, and time.  Skin: Skin is warm and dry. She is not diaphoretic.  Psychiatric: She has a normal mood and affect. Her behavior is normal.  Nursing note and vitals reviewed.   Urgent Care Course   Clinical Course     Procedures (including critical care time)  Labs Review Labs Reviewed - No data to display  Imaging Review No results found.   MDM   1. Acute bacterial conjunctivitis of right eye   2. Squamous blepharitis of both upper and lower eyelid of right eye    Pt c/o gradually worsening redness, pain, swelling and discharge around Right eye for about 2 days, initially started with URI symptoms.  No evidence of underlying abscess.  Rx: erythromycin ointment and doxycycline PO. Home care instructions provided.  F/u in 3-4 days if not improving. Patient verbalized understanding and agreement with treatment plan.    Noland Fordyce,  PA-C 04/07/16 1019

## 2016-04-10 ENCOUNTER — Ambulatory Visit (INDEPENDENT_AMBULATORY_CARE_PROVIDER_SITE_OTHER): Payer: Commercial Managed Care - PPO | Admitting: Family Medicine

## 2016-04-10 ENCOUNTER — Encounter: Payer: Self-pay | Admitting: Internal Medicine

## 2016-04-10 ENCOUNTER — Encounter: Payer: Self-pay | Admitting: Family Medicine

## 2016-04-10 VITALS — BP 116/72 | HR 96 | Wt 244.8 lb

## 2016-04-10 DIAGNOSIS — R7303 Prediabetes: Secondary | ICD-10-CM

## 2016-04-10 DIAGNOSIS — Z7689 Persons encountering health services in other specified circumstances: Secondary | ICD-10-CM | POA: Diagnosis not present

## 2016-04-10 DIAGNOSIS — H1031 Unspecified acute conjunctivitis, right eye: Secondary | ICD-10-CM | POA: Diagnosis not present

## 2016-04-10 DIAGNOSIS — H01001 Unspecified blepharitis right upper eyelid: Secondary | ICD-10-CM | POA: Diagnosis not present

## 2016-04-10 DIAGNOSIS — E782 Mixed hyperlipidemia: Secondary | ICD-10-CM

## 2016-04-10 DIAGNOSIS — E781 Pure hyperglyceridemia: Secondary | ICD-10-CM | POA: Diagnosis not present

## 2016-04-10 MED ORDER — FENOFIBRATE 48 MG PO TABS: 48.0000 mg | ORAL_TABLET | Freq: Every day | ORAL | 1 refills | Status: DC

## 2016-04-10 NOTE — Progress Notes (Signed)
Subjective:    Patient ID: Carolyn Salazar, female    DOB: 03-14-1971, 45 y.o.   MRN: [id number]  HPI Chief Complaint  Patient presents with  . new pt    new pt- get established- go over lab results from obgyn, eye issue- swollen and red- has antibiotic and ointment to use   She is new to the practice and here to establish care. No PCP in a couple of years. Dr. Asa Lente was previous PCP.   History of prediabetes for past 5-6 years. No medication for this in the past. Hemoglobin A1C 6.2% on 03/29/2016 Elevated triglycerides and low HDL. Is taking simvastatin 14m she was put on this approximately 6-7 years ago. Never been on higher dose.   States she has lost 30 lbs over the past year and [facility]  She has seen a nutritionist in the past. States she stopped dieting and has gained 5 lbs over the past few weeks.   States she currently has a right eye infection, was seen at urgent care and being treated for this. Is taking an oral antibiotic and topical antibiotic. States her symptoms are improving.  No foreign body sensation. No fever, chills, changes in vision, eye pain or drainage.    Tdap.: never, had Td in 2001.   Other providers: Dr. FToney RakesOB/GYN. Dr. TSkeet LatchCardiologist. Dr. LArminda ResidesPsychiatrist.  Last saw cards in march and is due again next march states CHF is stable.   Social history: Lives with husband, does not work. Disabled for CHF. Was on heart transplant list at one point.  IUD in place for contraception.   Reviewed allergies, medications, past medical, surgical, and social history.  Past Medical History:  Diagnosis Date  . Arthritis    Hands, Knees RT>LT  . BENIGN NEOPLASM OF SKIN SITE UNSPECIFIED   . BUNIONS, BILATERAL   . CARDIOMYOPATHY 02/1999   EF 20% in 02/1999, improved over time. Echo 01/2006 - EF 50-55% EF 04/2008 35-40% EF 40-45% Echo 07/22/2009  . Chest pain    Nuclear, 2007,  normal ( catheterization in 2000, normal coronary arteries)  . DEPRESSION   . DIABETES MELLITUS, TYPE II, CONTROLLED, MILD   . DYSLIPIDEMIA   . Ejection fraction    EF 20%, echo, 2000  //   EF 50%, echo, 2007  //   EF 35-40%, echo, 2009  //   EF 40-45%, echo, 2011  //   EF 40-45%, echo, December, 2012  . Hypotension    November, 2012  . METABOLIC SYNDROME X    hypertriglycerides 04/2008, hyperglycemia  . MIGRAINE HEADACHE   . OBESITY   . Shingles (herpes zoster) polyneuropathy   . Sinus tachycardia    Past Surgical History:  Procedure Laterality Date  . BUNIONECTOMY  2012   LEFT  . BUNIONECTOMY Right   . CARDIAC CATHETERIZATION    . CARDIAC CATHETERIZATION N/A 08/25/2015   Procedure: Left Heart Cath and Coronary Angiography;  Surgeon: TTroy Sine MD;  [facility];  Service: Cardiovascular;  Laterality: N/A;  . heart biopsy    . vein scope       Review of Systems Pertinent positives and negatives in the history of present illness.     Objective:   Physical Exam  Constitutional: She is oriented to person, place, and time. She appears well-developed and well-nourished. No distress.  HENT:  Mouth/Throat: Uvula is midline, oropharynx is clear and  moist and mucous membranes are normal.  Eyes: Conjunctivae and EOM are normal. Pupils are equal, round, and reactive to light.    Right upper eye lid with mild erythema and edema.   Neck: Normal range of motion. Neck supple.  Cardiovascular: Normal rate, regular rhythm and normal heart sounds.   Pulmonary/Chest: Effort normal and breath sounds normal.  Lymphadenopathy:    She has no cervical adenopathy.  Neurological: She is alert and oriented to person, place, and time. Gait normal.  Skin: Skin is warm and dry. No rash noted. No pallor.  Psychiatric: She has a normal mood and affect. Her speech is normal and behavior is normal. Thought content normal.   BP 116/72   Pulse 96   Wt 244 lb 12.8 oz (111 kg)    BMI 39.51 kg/m      Assessment & Plan:  Mixed hyperlipidemia - Plan: fenofibrate (TRICOR) 48 MG tablet  Pre-diabetes  Encounter to establish care  Acute bacterial conjunctivitis of right eye  High triglycerides - Plan: fenofibrate (TRICOR) 48 MG tablet  Blepharitis of right upper eyelid, unspecified type  Pre-diabetes: A1C 6.2%. Counseled on healthy diet and lifestyle modifications. Avoid white foods. Use free app My Fitness Pal to count calories, sugar and carbohydrates.   Plan to keep her on current cholesterol medication simvastatin as her LDL is within goal range and add low dose fenofibrate for lowering triglycerides and VLDL. Will follow up in 4-6 weeks for this.  Also discussed diet and exercise.  Conjunctivitis appears to be clearing up but she does have blepharitis. She states symptoms of right eye have improved since being on medication and will continue taking this until she has completed the dosing. She will follow up if not back to baseline after finishing these.  Follow up in 4-8 weeks or sooner if needed.

## 2016-05-10 ENCOUNTER — Other Ambulatory Visit: Payer: Self-pay | Admitting: Family Medicine

## 2016-05-10 DIAGNOSIS — E782 Mixed hyperlipidemia: Secondary | ICD-10-CM

## 2016-05-10 DIAGNOSIS — E781 Pure hyperglyceridemia: Secondary | ICD-10-CM

## 2016-05-24 ENCOUNTER — Other Ambulatory Visit: Payer: Self-pay

## 2016-05-24 MED ORDER — FUROSEMIDE 40 MG PO TABS: 40.0000 mg | ORAL_TABLET | Freq: Every day | ORAL | 0 refills | Status: DC

## 2016-06-14 ENCOUNTER — Telehealth: Payer: Self-pay | Admitting: Family Medicine

## 2016-06-14 DIAGNOSIS — E781 Pure hyperglyceridemia: Secondary | ICD-10-CM

## 2016-06-14 DIAGNOSIS — E782 Mixed hyperlipidemia: Secondary | ICD-10-CM

## 2016-06-14 MED ORDER — FENOFIBRATE 48 MG PO TABS: ORAL_TABLET | ORAL | 5 refills | Status: DC

## 2016-06-14 NOTE — Telephone Encounter (Signed)
Pt called and needs a refill on her fenofibrate states the pharmacy will not fill it for it even thought it states she has 5 refills for it [place], [place] - [address]

## 2016-06-14 NOTE — Telephone Encounter (Signed)
done

## 2016-06-19 ENCOUNTER — Ambulatory Visit: Payer: Commercial Managed Care - PPO | Admitting: Family Medicine

## 2016-06-20 ENCOUNTER — Ambulatory Visit: Payer: Commercial Managed Care - PPO | Admitting: Family Medicine

## 2016-06-20 ENCOUNTER — Other Ambulatory Visit: Payer: Self-pay | Admitting: *Deleted

## 2016-06-20 MED ORDER — FUROSEMIDE 40 MG PO TABS: 40.0000 mg | ORAL_TABLET | Freq: Every day | ORAL | 0 refills | Status: DC

## 2016-06-20 NOTE — Telephone Encounter (Signed)
furosemide (LASIX) 40 MG tablet  Medication  Date: 05/24/2016 Department: [address] Office Ordering/Authorizing: Skeet Latch, MD  Order Providers   Prescribing Provider Encounter Provider  Skeet Latch, MD Stephannie Peters, CMA  Medication Detail    Disp Refills Start End   furosemide (LASIX) 40 MG tablet 30 tablet 0 05/24/2016    Sig - Route: Take 1 tablet (40 mg total) by mouth daily. - Oral   Notes to Pharmacy: Patient needs to call and schedule appt to see doctor before any future refills will be sent. [phone].   E-Prescribing Status: Receipt confirmed by pharmacy (05/24/2016 2:29 PM EST)   Pharmacy   [id number] - [place], [place]

## 2016-06-23 ENCOUNTER — Encounter: Payer: Self-pay | Admitting: Family Medicine

## 2016-07-21 ENCOUNTER — Other Ambulatory Visit: Payer: Self-pay

## 2016-07-21 MED ORDER — FUROSEMIDE 40 MG PO TABS: 40.0000 mg | ORAL_TABLET | Freq: Every day | ORAL | 3 refills | Status: DC

## 2016-07-21 MED ORDER — LISINOPRIL 2.5 MG PO TABS
2.5000 mg | ORAL_TABLET | Freq: Every day | ORAL | 1 refills | Status: DC
Start: 2016-07-21 — End: 2016-09-08

## 2016-07-21 MED ORDER — DIGOXIN 125 MCG PO TABS: 125.0000 ug | ORAL_TABLET | Freq: Every day | ORAL | 1 refills | Status: DC

## 2016-08-10 ENCOUNTER — Other Ambulatory Visit: Payer: Self-pay | Admitting: Cardiovascular Disease

## 2016-08-10 NOTE — Telephone Encounter (Signed)
Please review for refill. Thanks!  

## 2016-08-11 NOTE — Telephone Encounter (Signed)
REFILL 

## 2016-08-22 ENCOUNTER — Telehealth: Payer: Self-pay

## 2016-08-22 DIAGNOSIS — E781 Pure hyperglyceridemia: Secondary | ICD-10-CM

## 2016-08-22 DIAGNOSIS — E782 Mixed hyperlipidemia: Secondary | ICD-10-CM

## 2016-08-22 MED ORDER — FENOFIBRATE 48 MG PO TABS: ORAL_TABLET | ORAL | 0 refills | Status: DC

## 2016-08-22 NOTE — Telephone Encounter (Signed)
Fax request for fenofibrate called to [facility].

## 2016-08-22 NOTE — Telephone Encounter (Signed)
done

## 2016-09-08 ENCOUNTER — Telehealth: Payer: Self-pay | Admitting: Cardiovascular Disease

## 2016-09-08 MED ORDER — DIGOXIN 125 MCG PO TABS: 125.0000 ug | ORAL_TABLET | Freq: Every day | ORAL | 0 refills | Status: DC

## 2016-09-08 MED ORDER — FUROSEMIDE 40 MG PO TABS: 40.0000 mg | ORAL_TABLET | Freq: Every day | ORAL | 0 refills | Status: DC

## 2016-09-08 MED ORDER — CARVEDILOL 25 MG PO TABS: 25.0000 mg | ORAL_TABLET | Freq: Two times a day (BID) | ORAL | 0 refills | Status: DC

## 2016-09-08 MED ORDER — SIMVASTATIN 20 MG PO TABS: ORAL_TABLET | ORAL | 0 refills | Status: DC

## 2016-09-08 MED ORDER — LISINOPRIL 2.5 MG PO TABS: 2.5000 mg | ORAL_TABLET | Freq: Every day | ORAL | 0 refills | Status: DC

## 2016-09-08 MED ORDER — POTASSIUM CHLORIDE CRYS ER 20 MEQ PO TBCR: 20.0000 meq | EXTENDED_RELEASE_TABLET | Freq: Every day | ORAL | 0 refills | Status: DC

## 2016-09-08 NOTE — Telephone Encounter (Signed)
New Message  Pt voiced wanting nurse to give her a call.  Please f/u

## 2016-09-08 NOTE — Telephone Encounter (Signed)
Patient called in wanting to know why her prescriptions were not refilled. Advised her that she needs an appt with MD. Scheduled for 10/02/16. Rx(s) sent to pharmacy electronically.

## 2016-10-04 ENCOUNTER — Encounter: Payer: Self-pay | Admitting: Gynecology

## 2016-10-12 ENCOUNTER — Ambulatory Visit: Payer: Commercial Managed Care - PPO | Admitting: Cardiovascular Disease

## 2016-11-01 ENCOUNTER — Other Ambulatory Visit: Payer: Self-pay | Admitting: Cardiovascular Disease

## 2016-11-01 NOTE — Telephone Encounter (Signed)
Refill Request.  

## 2016-11-02 ENCOUNTER — Other Ambulatory Visit: Payer: Self-pay | Admitting: Cardiovascular Disease

## 2016-11-02 NOTE — Telephone Encounter (Signed)
Patient notified at the time of her last refill request appointment needed. She scheduled appointment, however rescheduled the appointment to a later date.

## 2016-11-02 NOTE — Telephone Encounter (Signed)
Please review for refill, Thanks !  

## 2016-11-26 NOTE — Progress Notes (Signed)
Cardiology Office Note   Date:  11/27/2016   ID:  Carolyn Salazar, DOB [birth year], MRN [id number]  PCP:  Girtha Rm, NP-C  Cardiologist:   Skeet Latch, MD   Chief Complaint  Patient presents with  . Follow-up     Patient ID: Carolyn Salazar is a 46 y.o. 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  Carolyn Salazar is a patient who previously saw Dr. Ron Parker.  She was diagnosed with systolic heart failure, LVEF 20% in 2000.  On repeat echo in 2014 revealed LVEF 50-55% and normal diastolic function.   Carolyn Salazar reports undergoing cardiac cath at the [facility] in 2000 that was negative for ischemia.  She later had stress testing in 2007 that was negative for ischemia, though none recently.  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 She was seen 07/2015 and reported exertional chest pain.  She was referred for a Lexiscan Myoview that revealed LVEF 57% with anterior, apical and apical lateral infarct with peri-infarct ischemia.  She was referred for left heart catheterization 08/25/15 that revealed LVEF 45-50% with diffuse hypokinesis and no identifiable CAD.  LVEDP was 3 mmHg.  Since her last appointment Carolyn Salazar has been doing well.  She denies Chest pain or shortness of breath. She denies lower extremity edema, orthopnea, or PND. She has not been exercising regularly, which she attributes to laziness.   Past Medical History:  Diagnosis Date  . Arthritis    Hands, Knees RT>LT  . BENIGN NEOPLASM OF SKIN SITE UNSPECIFIED   . BUNIONS, BILATERAL   . CARDIOMYOPATHY 02/1999   EF 20% in 02/1999, improved over time. Echo 01/2006 - EF 50-55% EF 04/2008 35-40% EF 40-45% Echo 07/22/2009  . Chest pain    Nuclear, 2007, normal ( catheterization in 2000, normal coronary arteries)  . DEPRESSION   . DIABETES MELLITUS, TYPE II, CONTROLLED,  MILD   . DYSLIPIDEMIA   . Ejection fraction    EF 20%, echo, 2000  //   EF 50%, echo, 2007  //   EF 35-40%, echo, 2009  //   EF 40-45%, echo, 2011  //   EF 40-45%, echo, December, 2012  . Hypotension    November, 2012  . METABOLIC SYNDROME X    hypertriglycerides 04/2008, hyperglycemia  . MIGRAINE HEADACHE   . OBESITY   . Shingles (herpes zoster) polyneuropathy   . Sinus tachycardia     Past Surgical History:  Procedure Laterality Date  . BUNIONECTOMY  2012   LEFT  . BUNIONECTOMY Right   . CARDIAC CATHETERIZATION    . CARDIAC CATHETERIZATION N/A 08/25/2015   Procedure: Left Heart Cath and Coronary Angiography;  Surgeon: Troy Sine, MD;  Location: [facility];  Service: Cardiovascular;  Laterality: N/A;  . heart biopsy    . vein scope       Current Outpatient Prescriptions  Medication Sig Dispense Refill  . ARIPiprazole (ABILIFY) 5 MG tablet Take 7.5 mg by mouth daily.     . beta carotene w/minerals (OCUVITE) tablet Take 1 tablet by mouth daily.    . carvedilol (COREG) 25 MG tablet TAKE 1 TABLET BY MOUTH TWO  TIMES DAILY WITH A MEAL 120 tablet 0  . cetirizine (ZYRTEC) 10 MG tablet Take 10 mg by mouth daily.      Marland Kitchen DIGOX 125 MCG tablet TAKE 1 TABLET BY  MOUTH  DAILY 60 tablet 0  . Esomeprazole Magnesium (NEXIUM PO) Take 1 capsule by mouth daily. Reported on 07/21/2015    . fenofibrate (TRICOR) 48 MG tablet TAKE 1 TABLET(48 MG) BY MOUTH DAILY 90 tablet 0  . FLUoxetine (PROZAC) 40 MG capsule Take 1 capsule (40 mg total) by mouth daily. (Patient taking differently: Take 80 mg by mouth daily. ) 90 capsule 3  . fluticasone (FLONASE) 50 MCG/ACT nasal spray Place 2 sprays into both nostrils daily. 9.9 g 2  . furosemide (LASIX) 40 MG tablet Take 1 tablet (40 mg total) by mouth daily. OV needed 30 tablet 1  . Glucosamine-Chondroitin (GLUCOSAMINE CHONDR COMPLEX) 500-400 MG CAPS Take 1 tablet by mouth daily.     Marland Kitchen lamoTRIgine (LAMICTAL) 200 MG tablet Take 400 mg by mouth daily.       Marland Kitchen lisinopril (PRINIVIL,ZESTRIL) 2.5 MG tablet TAKE 1 TABLET BY MOUTH  DAILY 60 tablet 0  . potassium chloride SA (K-DUR,KLOR-CON) 20 MEQ tablet TAKE 1 TABLET BY MOUTH  DAILY 60 tablet 0  . simvastatin (ZOCOR) 20 MG tablet Take 1 tablet (20 mg total) by mouth every evening. OV needed. 30 tablet 1  . zolpidem (AMBIEN) 10 MG tablet Take 10 mg by mouth at bedtime.      Current Facility-Administered Medications  Medication Dose Route Frequency Provider Last Rate Last Dose  . levonorgestrel (MIRENA) 20 MCG/24HR IUD   Intrauterine Once Terrance Mass, MD        Allergies:   Sulfonamide derivatives    Social History:  The patient  reports that she has never smoked. She has never used smokeless tobacco. She reports that she drinks about 2.4 oz of alcohol per week . She reports that she does not use drugs.   Family History:  The patient's family history includes Arthritis in her other and sister; Cancer in her mother; Diabetes in her father and mother; Diabetes type II in her mother; Heart attack in her father; Heart failure in her father; Hyperlipidemia in her mother and other; Hypertension in her other.    ROS:  Please see the history of present illness.   Otherwise, review of systems are positive for none.   All other systems are reviewed and negative.    PHYSICAL EXAM: VS:  BP 100/60 (BP Location: Right Wrist, Patient Position: Sitting, Cuff Size: Normal)   Pulse 91   Ht 5' 6"  (1.676 m)   Wt 110.2 kg (243 lb)   BMI 39.22 kg/m  , BMI Body mass index is 39.22 kg/m. GENERAL:  Well appearing.  No acute distress.  HEENT: Pupils equal round and reactive, fundi not visualized, oral mucosa unremarkable NECK:  No jugular venous distention, waveform within normal limits, carotid upstroke brisk and symmetric, no bruits, no thyromegaly LUNGS:  Clear to auscultation bilaterally.  No crackles, wheezes, or rhonchi. HEART:  RRR.  PMI not displaced or sustained,S1 and S2 within normal limits, no S3,  no S4, no clicks, no rubs, no murmurs ABD:  Flat, positive bowel sounds normal in frequency in pitch, no bruits, no rebound, no guarding, no midline pulsatile mass, no hepatomegaly, no splenomegaly EXT:  2 plus pulses throughout, no edema, no cyanosis no clubbing SKIN:  No rashes no nodules NEURO:  Cranial nerves II through XII grossly intact, motor grossly intact throughout PSYCH:  Cognitively intact, oriented to person place and time  EKG:  EKG is ordered today. The ekg ordered today demonstrates sinus rhythm rate 90 bpm.  Early R  wave progression,  Cannot rule out prior inferior infarct.  Non-specific ST-T changes. 11/27/16 sinus rhythm. Rate 91 bpm. Nonspecific ST/T changes   Lexiscan Cardiolite 08/05/15:  The left ventricular ejection fraction is normal (55-65%).  Nuclear stress EF: 57%.  There was no ST segment deviation noted during stress.  No T wave inversion was noted during stress.  Defect 1: There is a large defect of moderate severity.  Findings consistent with prior myocardial infarction with peri-infarct ischemia.  This is an intermediate risk study.  Large size, moderate intensity partially reversible anterior, apical and apical lateral perfusion defect. LVEF 57% with normal wall motion. This could be consistent with anteroapical scar and lateral ischemia or shifting breast artifact. Clinical correlation advised. This is an intermediate risk study.   Recent Labs: No results found for requested labs within last 8760 hours.    Lipid Panel    Component Value Date/Time   CHOL 179 03/29/2016 0907   TRIG 391 (H) 03/29/2016 0907   HDL 40 (L) 03/29/2016 0907   CHOLHDL 4.5 03/29/2016 0907   VLDL 78 (H) 03/29/2016 0907   LDLCALC 61 03/29/2016 0907   LDLDIRECT 130.8 01/15/2014 1209   Chol 163, tri 247, hdl 44, ldl 91    Wt Readings from Last 3 Encounters:  11/27/16 110.2 kg (243 lb)  04/10/16 111 kg (244 lb 12.8 oz)  04/07/16 110.2 kg (243 lb)       ASSESSMENT AND PLAN:  # Exertional chest pain: # Abnormal stress test: Carolyn Salazar's chest pain has resolved. Stress test was concerning for ischemia but left heart catheterization revealed no coronary artery disease  # Chronic systolic heart failure, LVEF improved: LVEF 45-50% on cath and 57% on nuclear stress testing.  She is euvolemic on exam. Continue carvedilol 25 mg twice a day, Lasix 40 mg daily, and lisinopril 2.5 mg daily. Check CMP and digoxin level.  She will come in the morning prior to her a.m. dose.  # Hypertension:  Blood pressure is well-controlled. Continue carvedilol 25 mg bid and lisinopril 2.5 mg daily.   # Hyperlipidemia: She is due for repeat lipids. Her triglycerides were elevated when last checked 03/2016. We may need to increase her dose of fenofibrate.  Continue this and simvastatin for now.   # Morbid obesity: Encouraged to increase her physical activity.    Current medicines are reviewed at length with the patient today.  The patient does not have concerns regarding medicines.  The following changes have been made:  None Labs/ tests ordered today include:   Orders Placed This Encounter  Procedures  . Lipid panel  . Comprehensive metabolic panel  . Digoxin level     Disposition:   FU with Tish Begin C. Oval Linsey, MD, [facility] in 1 year.   This note was written with the assistance of speech recognition software.  Please excuse any transcriptional errors.  Signed, Annie Saephan C. Oval Linsey, MD, [facility]  11/27/2016 6:35 PM    [place]

## 2016-11-27 ENCOUNTER — Encounter: Payer: Self-pay | Admitting: Cardiovascular Disease

## 2016-11-27 ENCOUNTER — Ambulatory Visit (INDEPENDENT_AMBULATORY_CARE_PROVIDER_SITE_OTHER): Payer: Commercial Managed Care - PPO | Admitting: Cardiovascular Disease

## 2016-11-27 VITALS — BP 100/60 | HR 91 | Ht 66.0 in | Wt 243.0 lb

## 2016-11-27 DIAGNOSIS — I5042 Chronic combined systolic (congestive) and diastolic (congestive) heart failure: Secondary | ICD-10-CM | POA: Diagnosis not present

## 2016-11-27 DIAGNOSIS — E781 Pure hyperglyceridemia: Secondary | ICD-10-CM | POA: Diagnosis not present

## 2016-11-27 DIAGNOSIS — Z79899 Other long term (current) drug therapy: Secondary | ICD-10-CM | POA: Diagnosis not present

## 2016-11-27 DIAGNOSIS — I10 Essential (primary) hypertension: Secondary | ICD-10-CM | POA: Diagnosis not present

## 2016-11-27 NOTE — Patient Instructions (Signed)
Medication Instructions:  Your physician recommends that you continue on your current medications as directed. Please refer to the Current Medication list given to you today.  Labwork: FASTING LP/CMET/DIG LEVEL SOON  Testing/Procedures: NONE  Follow-Up: Your physician wants you to follow-up in: [place] will receive a reminder letter in the mail two months in advance. If you don't receive a letter, please call our office to schedule the follow-up appointment.  If you need a refill on your cardiac medications before your next appointment, please call your pharmacy.

## 2016-12-05 ENCOUNTER — Encounter: Payer: Self-pay | Admitting: Family Medicine

## 2016-12-05 ENCOUNTER — Ambulatory Visit (INDEPENDENT_AMBULATORY_CARE_PROVIDER_SITE_OTHER): Payer: Commercial Managed Care - PPO | Admitting: Family Medicine

## 2016-12-05 VITALS — BP 124/80 | HR 83 | Temp 98.2°F | Wt 247.0 lb

## 2016-12-05 DIAGNOSIS — L0291 Cutaneous abscess, unspecified: Secondary | ICD-10-CM | POA: Diagnosis not present

## 2016-12-05 MED ORDER — CEPHALEXIN 500 MG PO CAPS: 500.0000 mg | ORAL_CAPSULE | Freq: Two times a day (BID) | ORAL | 0 refills | Status: DC

## 2016-12-05 NOTE — Progress Notes (Signed)
   Subjective:    Patient ID: Carolyn Salazar, female    DOB: 03-Jul-1970, 46 y.o.   MRN: [id number]  HPI Chief Complaint  Patient presents with  . bump    bump- on left shoulder. popped it friday and pus came out, and popped open yesterday and pus came out. still hurts   She is here with complaints of a one week history of bump on her left shoulder that has been gradually improving.  States it popped and pus came out yesterday.  Denies fever, chills, nausea, vomiting.   Denies history of MRSA or diabetes.   Reviewed allergies, medications, past medical, surgical, and social history.   Review of Systems Pertinent positives and negatives in the history of present illness.     Objective:   Physical Exam  Constitutional: She appears well-developed and well-nourished. No distress.  Skin: Skin is warm and dry. No rash noted. No pallor.  1 cm round firm area with a center that is slightly raised. No surrounding erythema, induration or fluctuance. No drainage. No sign of cellulitis.   BP 124/80   Pulse 83   Temp 98.2 F (36.8 C) (Oral)   Wt 247 lb (112 kg)   SpO2 98%   BMI 39.87 kg/m         Assessment & Plan:  Abscess  She will use warm compresses to the area 2-3 times daily and complete the antibiotic as prescribed. Follow up if not back to baseline or any worsening symptoms.

## 2016-12-05 NOTE — Patient Instructions (Signed)
Use warm compresses as discussed 2-3 times per day.  Take the antibiotic and follow up if not back to baseline or if you get worse.    Skin Abscess A skin abscess is an infected area on or under your skin that contains pus and other material. An abscess can happen almost anywhere on your body. Some abscesses break open (rupture) on their own. Most continue to get worse unless they are treated. The infection can spread deeper into the body and into your blood, which can make you feel sick. Treatment usually involves draining the abscess. Follow these instructions at home: Abscess Care  If you have an abscess that has not drained, place a warm, clean, wet washcloth over the abscess several times a day. Do this as told by your doctor.  Follow instructions from your doctor about how to take care of your abscess. Make sure you: ? Cover the abscess with a bandage (dressing). ? Change your bandage or gauze as told by your doctor. ? Wash your hands with soap and water before you change the bandage or gauze. If you cannot use soap and water, use hand sanitizer.  Check your abscess every day for signs that the infection is getting worse. Check for: ? More redness, swelling, or pain. ? More fluid or blood. ? Warmth. ? More pus or a bad smell. Medicines   Take over-the-counter and prescription medicines only as told by your doctor.  If you were prescribed an antibiotic medicine, take it as told by your doctor. Do not stop taking the antibiotic even if you start to feel better. General instructions  To avoid spreading the infection: ? Do not share personal care items, towels, or hot tubs with others. ? Avoid making skin-to-skin contact with other people.  Keep all follow-up visits as told by your doctor. This is important. Contact a doctor if:  You have more redness, swelling, or pain around your abscess.  You have more fluid or blood coming from your abscess.  Your abscess feels warm when  you touch it.  You have more pus or a bad smell coming from your abscess.  You have a fever.  Your muscles ache.  You have chills.  You feel sick. Get help right away if:  You have very bad (severe) pain.  You see red streaks on your skin spreading away from the abscess. This information is not intended to replace advice given to you by your health care provider. Make sure you discuss any questions you have with your health care provider. Document Released: 10/25/2007 Document Revised: 01/02/2016 Document Reviewed: 03/17/2015 Elsevier Interactive Patient Education  [employer].

## 2016-12-19 DIAGNOSIS — F3176 Bipolar disorder, in full remission, most recent episode depressed: Secondary | ICD-10-CM | POA: Diagnosis not present

## 2016-12-21 ENCOUNTER — Other Ambulatory Visit: Payer: Self-pay | Admitting: Cardiovascular Disease

## 2016-12-21 NOTE — Telephone Encounter (Signed)
Please review for refill, thanks ! 

## 2017-01-23 ENCOUNTER — Encounter: Payer: Self-pay | Admitting: *Deleted

## 2017-01-23 ENCOUNTER — Emergency Department (INDEPENDENT_AMBULATORY_CARE_PROVIDER_SITE_OTHER)
Admission: EM | Admit: 2017-01-23 | Discharge: 2017-01-23 | Disposition: A | Discharge status: Home / Self Care | Payer: Commercial Managed Care - PPO | Source: Home / Self Care | Attending: Family Medicine | Admitting: Family Medicine

## 2017-01-23 DIAGNOSIS — L02416 Cutaneous abscess of left lower limb: Secondary | ICD-10-CM | POA: Diagnosis not present

## 2017-01-23 MED ORDER — TRAMADOL HCL 50 MG PO TABS: 50.0000 mg | ORAL_TABLET | Freq: Four times a day (QID) | ORAL | 0 refills | Status: DC | PRN

## 2017-01-23 MED ORDER — DOXYCYCLINE HYCLATE 100 MG PO CAPS: 100.0000 mg | ORAL_CAPSULE | Freq: Two times a day (BID) | ORAL | 0 refills | Status: DC

## 2017-01-23 NOTE — ED Provider Notes (Signed)
[name]    CSN: [id number] Arrival date & time: 01/23/17  1123     History   Chief Complaint Chief Complaint  Patient presents with  . Abscess    HPI Carolyn Salazar is a 46 y.o. female.   HPI  Carolyn Salazar is a 46 y.o. female presenting to UC with c/o 4 days of gradually worsening pain, swelling, redness and now and abscess on the front of her Left lower leg. Hx of similar abscess on Left upper back about 4-5 months ago.  She was treated with Keflex at that time.  Denies fever, chills, n/v/d. Denies known hx of MRSA. She is not a diabetic. No known insect bite to the area. She applied a warm compress last night and took tylenol. The area started to drain but no relief of pain.    Past Medical History:  Diagnosis Date  . Arthritis    Hands, Knees RT>LT  . BENIGN NEOPLASM OF SKIN SITE UNSPECIFIED   . BUNIONS, BILATERAL   . CARDIOMYOPATHY 02/1999   EF 20% in 02/1999, improved over time. Echo 01/2006 - EF 50-55% EF 04/2008 35-40% EF 40-45% Echo 07/22/2009  . Chest pain    Nuclear, 2007, normal ( catheterization in 2000, normal coronary arteries)  . DEPRESSION   . DIABETES MELLITUS, TYPE II, CONTROLLED, MILD   . DYSLIPIDEMIA   . Ejection fraction    EF 20%, echo, 2000  //   EF 50%, echo, 2007  //   EF 35-40%, echo, 2009  //   EF 40-45%, echo, 2011  //   EF 40-45%, echo, December, 2012  . Hypotension    November, 2012  . METABOLIC SYNDROME X    hypertriglycerides 04/2008, hyperglycemia  . MIGRAINE HEADACHE   . OBESITY   . Shingles (herpes zoster) polyneuropathy   . Sinus tachycardia     Patient Active Problem List   Diagnosis Date Noted  . Vulvar abscess 02/28/2016  . Abnormal nuclear stress test   . Ejection fraction   . Anemia 09/07/2011  . Chronic combined systolic and diastolic CHF (congestive heart failure) ([place]) 07/18/2011  . Chest pain   . HEARING LOSS 08/02/2010  . BUNIONS, BILATERAL 08/02/2010  . BENIGN NEOPLASM OF SKIN SITE  UNSPECIFIED 01/10/2010  . Pre-diabetes 10/08/2009  . Hyperlipidemia 10/07/2009  . METABOLIC SYNDROME X 12/87/8676  . OBESITY 10/07/2009  . DEPRESSION 10/07/2009  . MIGRAINE HEADACHE 10/07/2009  . ARTHRITIS 10/07/2009  . CHICKENPOX, HX OF 10/07/2009  . Nonischemic cardiomyopathy ([place]) 02/20/1999    Past Surgical History:  Procedure Laterality Date  . BUNIONECTOMY  2012   LEFT  . BUNIONECTOMY Right   . CARDIAC CATHETERIZATION    . CARDIAC CATHETERIZATION N/A 08/25/2015   Procedure: Left Heart Cath and Coronary Angiography;  Surgeon: Troy Sine, MD;  Location: [facility];  Service: Cardiovascular;  Laterality: N/A;  . heart biopsy    . vein scope      OB History    Gravida Para Term Preterm AB Living   1 1 1     1    SAB TAB Ectopic Multiple Live Births           1       Home Medications    Prior to Admission medications   Medication Sig Start Date End Date Taking? Authorizing Provider  ARIPiprazole (ABILIFY) 5 MG tablet Take 7.5 mg by mouth daily.     [provider]  beta carotene w/minerals (OCUVITE) tablet  Take 1 tablet by mouth daily.    [provider]  carvedilol (COREG) 25 MG tablet TAKE 1 TABLET BY MOUTH TWO  TIMES DAILY WITH A MEAL 12/21/16   Skeet Latch, MD  cephALEXin (KEFLEX) 500 MG capsule Take 1 capsule (500 mg total) by mouth 2 (two) times daily. 12/05/16   Henson, Vickie L, NP-C  cetirizine (ZYRTEC) 10 MG tablet Take 10 mg by mouth daily.      [provider]  [place] 125 MCG tablet TAKE 1 TABLET BY MOUTH  DAILY 12/21/16   Skeet Latch, MD  doxycycline (VIBRAMYCIN) 100 MG capsule Take 1 capsule (100 mg total) by mouth 2 (two) times daily. One po bid x 7 days 01/23/17   Noe Gens, PA-C  Esomeprazole Magnesium (NEXIUM PO) Take 1 capsule by mouth daily. Reported on 07/21/2015    [provider]  fenofibrate (TRICOR) 48 MG tablet TAKE 1 TABLET(48 MG) BY MOUTH DAILY 08/22/16   Henson, Vickie L, NP-C  FLUoxetine  (PROZAC) 40 MG capsule Take 1 capsule (40 mg total) by mouth daily. Patient taking differently: Take 80 mg by mouth daily.  01/15/14   Rowe Clack, MD  fluticasone (FLONASE) 50 MCG/ACT nasal spray Place 2 sprays into both nostrils daily. Patient not taking: Reported on 12/05/2016 04/07/16   Noe Gens, PA-C  furosemide (LASIX) 40 MG tablet Take 1 tablet (40 mg total) by mouth daily. OV needed 11/02/16   Skeet Latch, MD  Glucosamine-Chondroitin (GLUCOSAMINE CHONDR COMPLEX) 500-400 MG CAPS Take 1 tablet by mouth daily.     [provider]  lamoTRIgine (LAMICTAL) 200 MG tablet Take 400 mg by mouth daily.     [provider]  lisinopril (PRINIVIL,ZESTRIL) 2.5 MG tablet TAKE 1 TABLET BY MOUTH  DAILY 12/21/16   Skeet Latch, MD  potassium chloride SA (K-DUR,KLOR-CON) 20 MEQ tablet TAKE 1 TABLET BY MOUTH  DAILY 12/21/16   Skeet Latch, MD  simvastatin (ZOCOR) 20 MG tablet Take 1 tablet (20 mg total) by mouth every evening. OV needed. 11/02/16   Skeet Latch, MD  traMADol (ULTRAM) 50 MG tablet Take 1 tablet (50 mg total) by mouth every 6 (six) hours as needed. 01/23/17   Noe Gens, PA-C  zolpidem (AMBIEN) 10 MG tablet Take 10 mg by mouth at bedtime.     [provider]    Family History Family History  Problem Relation Age of Onset  . Diabetes Mother   . Cancer Mother        Ovarian, benign mass  . Diabetes type II Mother   . Hyperlipidemia Mother   . Diabetes Father   . Heart failure Father   . Heart attack Father   . Hypertension Other        Parent  . Hyperlipidemia Other        parent  . Arthritis Other        parent, grandparent  . Arthritis Sister     Social History Social History  Substance Use Topics  . Smoking status: Never Smoker  . Smokeless tobacco: Never Used  . Alcohol use 2.4 oz/week    4 Glasses of wine per week     Comment: 1 glass of wine every other week.      Allergies   Sulfonamide  derivatives   Review of Systems Review of Systems  Constitutional: Negative for chills and fever.  Gastrointestinal: Negative for nausea and vomiting.  Musculoskeletal: Positive for myalgias (Left lower anterior leg). Negative  for arthralgias and joint swelling.  Skin: Positive for color change, rash and wound.     Physical Exam Triage Vital Signs ED Triage Vitals  Enc Vitals Group     BP 01/23/17 1142 103/72     Pulse Rate 01/23/17 1142 91     Resp 01/23/17 1142 18     Temp 01/23/17 1142 98.1 F (36.7 C)     Temp Source 01/23/17 1142 Oral     SpO2 01/23/17 1142 97 %     Weight 01/23/17 1143 247 lb (112 kg)     Height 01/23/17 1143 5' 6"  (1.676 m)     Head Circumference --      Peak Flow --      Pain Score 01/23/17 1143 8     Pain Loc --      Pain Edu? --      Excl. in [place]? --    No data found.   Updated Vital Signs BP 103/72 (BP Location: Left Arm)   Pulse 91   Temp 98.1 F (36.7 C) (Oral)   Resp 18   Ht 5' 6"  (1.676 m)   Wt 247 lb (112 kg)   SpO2 97%   BMI 39.87 kg/m   Visual Acuity Right Eye Distance:   Left Eye Distance:   Bilateral Distance:    Right Eye Near:   Left Eye Near:    Bilateral Near:     Physical Exam  Constitutional: She is oriented to person, place, and time. She appears well-developed and well-nourished.  HENT:  Head: Normocephalic and atraumatic.  Eyes: EOM are normal.  Neck: Normal range of motion.  Cardiovascular: Normal rate.   Pulmonary/Chest: Effort normal.  Musculoskeletal: Normal range of motion. She exhibits edema and tenderness.  Left lower leg: mild edema surrounding abscess, tender. Calf is soft, non-tender (see skin exam)  Neurological: She is alert and oriented to person, place, and time.  Skin: Skin is warm and dry. There is erythema.     Left anterior leg: 2x4cm area of erythema, warmth and induration with centralized draining pustule. Tender. No underlying fluctuance.  Psychiatric: She has a normal mood and  affect. Her behavior is normal.  Nursing note and vitals reviewed.    UC Treatments / Results  Labs (all labs ordered are listed, but only abnormal results are displayed) Labs Reviewed  WOUND CULTURE    EKG  EKG Interpretation None       Radiology No results found.  Procedures Procedures (including critical care time)  Medications Ordered in UC Medications - No data to display   Initial Impression / Assessment and Plan / UC Course  I have reviewed the triage vital signs and the nursing notes.  Pertinent labs & imaging results that were available during my care of the patient were reviewed by me and considered in my medical decision making (see chart for details).     Wound culture sent to lab.   Bacitracin and bandage applied to wound.  Final Clinical Impressions(s) / UC Diagnoses   Final diagnoses:  Abscess of left lower leg   I&D not indicated at this time.  encouraged continued warm compresses. F/u in 5-7 days as needed, sooner if significantly worsening.   New Prescriptions Discharge Medication List as of 01/23/2017 12:06 PM    START taking these medications   Details  doxycycline (VIBRAMYCIN) 100 MG capsule Take 1 capsule (100 mg total) by mouth 2 (two) times daily. One po bid x 7 days, Starting  Tue 01/23/2017, Normal    traMADol (ULTRAM) 50 MG tablet Take 1 tablet (50 mg total) by mouth every 6 (six) hours as needed., Starting Tue 01/23/2017, Print         Controlled Substance Prescriptions [place] Controlled Substance Registry consulted? Yes, I have consulted the [place] Controlled Substances Registry for this patient, and feel the risk/benefit ratio today is favorable for proceeding with this prescription for a controlled substance.   Noe Gens, [place] 01/23/17 1317

## 2017-01-23 NOTE — Discharge Instructions (Signed)
°  Tramadol is strong pain medication. While taking, do not drink alcohol, drive, or perform any other activities that requires focus while taking these medications.

## 2017-01-23 NOTE — ED Triage Notes (Signed)
Pt c/o LT lower leg abscess x 4 days. She has taken Tylenol, IBF and Excedrin without relief. Reports a hx of an abscess, but did not have a Wcx. Took Keflex for her last abscess with relief.

## 2017-01-26 ENCOUNTER — Telehealth: Payer: Self-pay | Admitting: *Deleted

## 2017-01-26 LAB — WOUND CULTURE

## 2017-01-26 NOTE — Telephone Encounter (Signed)
Pt given Wcx results, advised her to complete ABT and to call back if she has any questions or concerns.

## 2017-01-27 ENCOUNTER — Telehealth: Payer: Self-pay | Admitting: Emergency Medicine

## 2017-01-30 ENCOUNTER — Encounter: Payer: Self-pay | Admitting: *Deleted

## 2017-01-30 ENCOUNTER — Emergency Department (INDEPENDENT_AMBULATORY_CARE_PROVIDER_SITE_OTHER)
Admission: EM | Admit: 2017-01-30 | Discharge: 2017-01-30 | Disposition: A | Discharge status: Home / Self Care | Payer: Commercial Managed Care - PPO | Source: Home / Self Care | Attending: Family Medicine | Admitting: Family Medicine

## 2017-01-30 DIAGNOSIS — L03116 Cellulitis of left lower limb: Secondary | ICD-10-CM

## 2017-01-30 MED ORDER — CLINDAMYCIN HCL 300 MG PO CAPS: ORAL_CAPSULE | ORAL | 0 refills | Status: DC

## 2017-01-30 NOTE — Discharge Instructions (Signed)
Keep wound clean and dry, and change dressing daily.  Return for any signs of infection (or follow-up with family doctor):  Increasing redness, swelling, pain, heat, drainage, etc.  Elevate leg whenever possible.  Apply heating pad several times daily.

## 2017-01-30 NOTE — ED Triage Notes (Signed)
Patient was seen on 01/23/17 for abscess to LLE. She finished the antibiotic this AM. The surrounding are is improved but the center is still white and draining.

## 2017-01-30 NOTE — ED Provider Notes (Signed)
[name]    CSN: [id number] Arrival date & time: 01/30/17  1344     History   Chief Complaint Chief Complaint  Patient presents with  . Wound Infection    HPI Carolyn Salazar is a 46 y.o. female.   Patient returns for follow-up of cellulitis left pre-tibial area after taking doxycycline for one week.  She reports that redness and pain around the wound have improved but she continues to have drainage, and the size of the wound has not decreased.  No fevers, chills, and sweats    The history is provided by the patient.    Past Medical History:  Diagnosis Date  . Arthritis    Hands, Knees RT>LT  . BENIGN NEOPLASM OF SKIN SITE UNSPECIFIED   . BUNIONS, BILATERAL   . CARDIOMYOPATHY 02/1999   EF 20% in 02/1999, improved over time. Echo 01/2006 - EF 50-55% EF 04/2008 35-40% EF 40-45% Echo 07/22/2009  . Chest pain    Nuclear, 2007, normal ( catheterization in 2000, normal coronary arteries)  . DEPRESSION   . DIABETES MELLITUS, TYPE II, CONTROLLED, MILD   . DYSLIPIDEMIA   . Ejection fraction    EF 20%, echo, 2000  //   EF 50%, echo, 2007  //   EF 35-40%, echo, 2009  //   EF 40-45%, echo, 2011  //   EF 40-45%, echo, December, 2012  . Hypotension    November, 2012  . METABOLIC SYNDROME X    hypertriglycerides 04/2008, hyperglycemia  . MIGRAINE HEADACHE   . OBESITY   . Shingles (herpes zoster) polyneuropathy   . Sinus tachycardia     Patient Active Problem List   Diagnosis Date Noted  . Vulvar abscess 02/28/2016  . Abnormal nuclear stress test   . Ejection fraction   . Anemia 09/07/2011  . Chronic combined systolic and diastolic CHF (congestive heart failure) ([place]) 07/18/2011  . Chest pain   . HEARING LOSS 08/02/2010  . BUNIONS, BILATERAL 08/02/2010  . BENIGN NEOPLASM OF SKIN SITE UNSPECIFIED 01/10/2010  . Pre-diabetes 10/08/2009  . Hyperlipidemia 10/07/2009  . METABOLIC SYNDROME X 22/97/9892  . OBESITY 10/07/2009  . DEPRESSION 10/07/2009  .  MIGRAINE HEADACHE 10/07/2009  . ARTHRITIS 10/07/2009  . CHICKENPOX, HX OF 10/07/2009  . Nonischemic cardiomyopathy ([place]) 02/20/1999    Past Surgical History:  Procedure Laterality Date  . BUNIONECTOMY  2012   LEFT  . BUNIONECTOMY Right   . CARDIAC CATHETERIZATION    . CARDIAC CATHETERIZATION N/A 08/25/2015   Procedure: Left Heart Cath and Coronary Angiography;  Surgeon: Troy Sine, MD;  Location: [facility];  Service: Cardiovascular;  Laterality: N/A;  . heart biopsy    . vein scope      OB History    Gravida Para Term Preterm AB Living   1 1 1     1    SAB TAB Ectopic Multiple Live Births           1       Home Medications    Prior to Admission medications   Medication Sig Start Date End Date Taking? Authorizing Provider  ARIPiprazole (ABILIFY) 5 MG tablet Take 7.5 mg by mouth daily.     [provider]  beta carotene w/minerals (OCUVITE) tablet Take 1 tablet by mouth daily.    [provider]  carvedilol (COREG) 25 MG tablet TAKE 1 TABLET BY MOUTH TWO  TIMES DAILY WITH A MEAL 12/21/16   Skeet Latch, MD  cetirizine (  ZYRTEC) 10 MG tablet Take 10 mg by mouth daily.      [provider]  clindamycin (CLEOCIN) 300 MG capsule Take one cap by mouth every 8 hours 01/30/17   Kandra Nicolas, MD  DIGOX 125 MCG tablet TAKE 1 TABLET BY MOUTH  DAILY 12/21/16   Skeet Latch, MD  Esomeprazole Magnesium (NEXIUM PO) Take 1 capsule by mouth daily. Reported on 07/21/2015    [provider]  fenofibrate (TRICOR) 48 MG tablet TAKE 1 TABLET(48 MG) BY MOUTH DAILY 08/22/16   Henson, Vickie L, NP-C  FLUoxetine (PROZAC) 40 MG capsule Take 1 capsule (40 mg total) by mouth daily. Patient taking differently: Take 80 mg by mouth daily.  01/15/14   Rowe Clack, MD  fluticasone (FLONASE) 50 MCG/ACT nasal spray Place 2 sprays into both nostrils daily. Patient not taking: Reported on 12/05/2016 04/07/16   Noe Gens, PA-C  furosemide (LASIX) 40 MG  tablet Take 1 tablet (40 mg total) by mouth daily. OV needed 11/02/16   Skeet Latch, MD  Glucosamine-Chondroitin (GLUCOSAMINE CHONDR COMPLEX) 500-400 MG CAPS Take 1 tablet by mouth daily.     [provider]  lamoTRIgine (LAMICTAL) 200 MG tablet Take 400 mg by mouth daily.     [provider]  lisinopril (PRINIVIL,ZESTRIL) 2.5 MG tablet TAKE 1 TABLET BY MOUTH  DAILY 12/21/16   Skeet Latch, MD  potassium chloride SA (K-DUR,KLOR-CON) 20 MEQ tablet TAKE 1 TABLET BY MOUTH  DAILY 12/21/16   Skeet Latch, MD  simvastatin (ZOCOR) 20 MG tablet Take 1 tablet (20 mg total) by mouth every evening. OV needed. 11/02/16   Skeet Latch, MD  traMADol (ULTRAM) 50 MG tablet Take 1 tablet (50 mg total) by mouth every 6 (six) hours as needed. 01/23/17   Noe Gens, PA-C  zolpidem (AMBIEN) 10 MG tablet Take 10 mg by mouth at bedtime.     [provider]    Family History Family History  Problem Relation Age of Onset  . Diabetes Mother   . Cancer Mother        Ovarian, benign mass  . Diabetes type II Mother   . Hyperlipidemia Mother   . Diabetes Father   . Heart failure Father   . Heart attack Father   . Hypertension Other        Parent  . Hyperlipidemia Other        parent  . Arthritis Other        parent, grandparent  . Arthritis Sister     Social History Social History  Substance Use Topics  . Smoking status: Never Smoker  . Smokeless tobacco: Never Used  . Alcohol use 2.4 oz/week    4 Glasses of wine per week     Comment: 1 glass of wine every other week.      Allergies   Sulfonamide derivatives   Review of Systems Review of Systems  Constitutional: Negative for chills, diaphoresis, fatigue and fever.  Respiratory: Negative for chest tightness and shortness of breath.   Cardiovascular: Negative for chest pain.  Skin: Positive for wound.  All other systems reviewed and are negative.    Physical Exam Triage Vital Signs ED Triage Vitals   Enc Vitals Group     BP 01/30/17 1404 110/76     Pulse Rate 01/30/17 1404 89     Resp --      Temp 01/30/17 1404 98.1 F (36.7 C)     Temp Source 01/30/17 1404  Oral     SpO2 01/30/17 1404 97 %     Weight --      Height --      Head Circumference --      Peak Flow --      Pain Score 01/30/17 1405 1     Pain Loc --      Pain Edu? --      Excl. in [place]? --    No data found.   Updated Vital Signs BP 110/76 (BP Location: Left Arm)   Pulse 89   Temp 98.1 F (36.7 C) (Oral)   SpO2 97%   Visual Acuity Right Eye Distance:   Left Eye Distance:   Bilateral Distance:    Right Eye Near:   Left Eye Near:    Bilateral Near:     Physical Exam  Constitutional: She appears well-developed and well-nourished. No distress.  HENT:  Head: Normocephalic.  Eyes: Pupils are equal, round, and reactive to light.  Cardiovascular: Normal rate.   Pulmonary/Chest: Effort normal.  Musculoskeletal:       Legs: Left mid pre-tibial area has a 1.5cm diameter superficial ulcer draining clear yellow fluid with surrounding tenderness and induration but no fluctuance.  Minimal surrounding erythema. No posterior calf tenderness to palpation.  Neurological: She is alert.  Skin: Skin is warm and dry.  Nursing note and vitals reviewed.     UC Treatments / Results  Labs (all labs ordered are listed, but only abnormal results are displayed) Labs Reviewed - No data to display  EKG  EKG Interpretation None       Radiology No results found.  Procedures Procedures (including critical care time)  Medications Ordered in UC Medications - No data to display   Initial Impression / Assessment and Plan / UC Course  I have reviewed the triage vital signs and the nursing notes.  Pertinent labs & imaging results that were available during my care of the patient were reviewed by me and considered in my medical decision making (see chart for details).    Minimal improvement after doxycycline for  one week.  Culture shows MRSA sensitive to doxycycline. Non-stick bandage applied. Begin Clindamycin 31m Q8hr. Arrange follow-up appointment with wound care clinic tomorrow 8am. Keep wound clean and dry, and change dressing daily.    Elevate leg whenever possible.  Apply heating pad several times daily.    Final Clinical Impressions(s) / UC Diagnoses   Final diagnoses:  Cellulitis of left lower leg    New Prescriptions New Prescriptions   CLINDAMYCIN (CLEOCIN) 300 MG CAPSULE    Take one cap by mouth every 8 hours         BKandra Nicolas MD 01/30/17 [phone]

## 2017-01-31 ENCOUNTER — Encounter (HOSPITAL_BASED_OUTPATIENT_CLINIC_OR_DEPARTMENT_OTHER): Payer: Commercial Managed Care - PPO | Attending: Surgery

## 2017-01-31 DIAGNOSIS — E669 Obesity, unspecified: Secondary | ICD-10-CM | POA: Diagnosis not present

## 2017-01-31 DIAGNOSIS — Z8614 Personal history of Methicillin resistant Staphylococcus aureus infection: Secondary | ICD-10-CM | POA: Diagnosis not present

## 2017-01-31 DIAGNOSIS — Z6839 Body mass index (BMI) 39.0-39.9, adult: Secondary | ICD-10-CM | POA: Diagnosis not present

## 2017-01-31 DIAGNOSIS — I429 Cardiomyopathy, unspecified: Secondary | ICD-10-CM | POA: Insufficient documentation

## 2017-01-31 DIAGNOSIS — Z79899 Other long term (current) drug therapy: Secondary | ICD-10-CM | POA: Diagnosis not present

## 2017-01-31 DIAGNOSIS — L02416 Cutaneous abscess of left lower limb: Secondary | ICD-10-CM | POA: Diagnosis not present

## 2017-02-07 DIAGNOSIS — L02416 Cutaneous abscess of left lower limb: Secondary | ICD-10-CM | POA: Diagnosis not present

## 2017-02-07 DIAGNOSIS — E669 Obesity, unspecified: Secondary | ICD-10-CM | POA: Diagnosis not present

## 2017-02-07 DIAGNOSIS — Z6839 Body mass index (BMI) 39.0-39.9, adult: Secondary | ICD-10-CM | POA: Diagnosis not present

## 2017-02-07 DIAGNOSIS — I429 Cardiomyopathy, unspecified: Secondary | ICD-10-CM | POA: Diagnosis not present

## 2017-02-07 DIAGNOSIS — Z8614 Personal history of Methicillin resistant Staphylococcus aureus infection: Secondary | ICD-10-CM | POA: Diagnosis not present

## 2017-02-07 DIAGNOSIS — Z79899 Other long term (current) drug therapy: Secondary | ICD-10-CM | POA: Diagnosis not present

## 2017-02-12 ENCOUNTER — Other Ambulatory Visit: Payer: Self-pay | Admitting: Cardiovascular Disease

## 2017-02-12 NOTE — Telephone Encounter (Signed)
Please review for refill. Thanks!  

## 2017-02-12 NOTE — Telephone Encounter (Signed)
Rx has been sent to the pharmacy electronically. ° °

## 2017-02-14 DIAGNOSIS — Z8614 Personal history of Methicillin resistant Staphylococcus aureus infection: Secondary | ICD-10-CM | POA: Diagnosis not present

## 2017-02-14 DIAGNOSIS — E669 Obesity, unspecified: Secondary | ICD-10-CM | POA: Diagnosis not present

## 2017-02-14 DIAGNOSIS — Z79899 Other long term (current) drug therapy: Secondary | ICD-10-CM | POA: Diagnosis not present

## 2017-02-14 DIAGNOSIS — L02416 Cutaneous abscess of left lower limb: Secondary | ICD-10-CM | POA: Diagnosis not present

## 2017-02-14 DIAGNOSIS — I429 Cardiomyopathy, unspecified: Secondary | ICD-10-CM | POA: Diagnosis not present

## 2017-02-14 DIAGNOSIS — Z6839 Body mass index (BMI) 39.0-39.9, adult: Secondary | ICD-10-CM | POA: Diagnosis not present

## 2017-02-15 ENCOUNTER — Other Ambulatory Visit: Payer: Self-pay

## 2017-02-20 ENCOUNTER — Telehealth: Payer: Self-pay | Admitting: Cardiovascular Disease

## 2017-02-20 DIAGNOSIS — Z79899 Other long term (current) drug therapy: Secondary | ICD-10-CM

## 2017-02-20 NOTE — Telephone Encounter (Signed)
New message    [facility] -  Reference [id number]   Manufacture is switching from [place] to [employer] , if this is ok for the Digoxin please call

## 2017-02-20 NOTE — Telephone Encounter (Signed)
Ok to switch.  Have patient get digoxin level about 2 weeks after switching.

## 2017-02-21 NOTE — Telephone Encounter (Signed)
Spoke w representative at [facility] to verify switch, advised OK to proceed w manufacturer change, & that I would inform the patient.  I called patient, explained manufacturer switch and rationale for blood level monitoring 2 weeks after the patient switches to new supply to ensure appropriate efficacy of high risk medication. Pt verbalized understanding. I entered order for her to return for this lab test at our office, and she is aware she may come in as walk-in M-F 8a-5p (w exception of ~12:45-1:45p) to have this test performed.

## 2017-02-28 ENCOUNTER — Encounter (HOSPITAL_BASED_OUTPATIENT_CLINIC_OR_DEPARTMENT_OTHER): Payer: Commercial Managed Care - PPO | Attending: Surgery

## 2017-02-28 ENCOUNTER — Other Ambulatory Visit: Payer: Self-pay | Admitting: Cardiovascular Disease

## 2017-02-28 ENCOUNTER — Other Ambulatory Visit: Payer: Self-pay | Admitting: Family Medicine

## 2017-02-28 DIAGNOSIS — E781 Pure hyperglyceridemia: Secondary | ICD-10-CM

## 2017-02-28 DIAGNOSIS — Z09 Encounter for follow-up examination after completed treatment for conditions other than malignant neoplasm: Secondary | ICD-10-CM | POA: Insufficient documentation

## 2017-02-28 DIAGNOSIS — Z872 Personal history of diseases of the skin and subcutaneous tissue: Secondary | ICD-10-CM | POA: Insufficient documentation

## 2017-02-28 DIAGNOSIS — E782 Mixed hyperlipidemia: Secondary | ICD-10-CM

## 2017-02-28 NOTE — Telephone Encounter (Signed)
Please review for refill. Thanks!  

## 2017-02-28 NOTE — Telephone Encounter (Signed)
Pt is due for a cpe in November but refill came through to mail order. Will send in 90 days but no more will be refilled until pt is seen. I have sent a mychart message to patient

## 2017-05-19 ENCOUNTER — Other Ambulatory Visit: Payer: Self-pay | Admitting: Family Medicine

## 2017-05-19 DIAGNOSIS — E782 Mixed hyperlipidemia: Secondary | ICD-10-CM

## 2017-05-19 DIAGNOSIS — E781 Pure hyperglyceridemia: Secondary | ICD-10-CM

## 2017-06-21 ENCOUNTER — Other Ambulatory Visit: Payer: Self-pay | Admitting: Family Medicine

## 2017-06-21 DIAGNOSIS — E782 Mixed hyperlipidemia: Secondary | ICD-10-CM

## 2017-06-21 DIAGNOSIS — E781 Pure hyperglyceridemia: Secondary | ICD-10-CM

## 2017-06-23 ENCOUNTER — Other Ambulatory Visit: Payer: Self-pay | Admitting: Cardiovascular Disease

## 2017-06-25 NOTE — Telephone Encounter (Signed)
Refill Request.  

## 2017-06-25 NOTE — Telephone Encounter (Signed)
Rx(s) sent to pharmacy electronically.  

## 2017-07-11 ENCOUNTER — Other Ambulatory Visit: Payer: Self-pay | Admitting: Cardiovascular Disease

## 2017-07-11 NOTE — Telephone Encounter (Signed)
Refill Request.  

## 2017-07-11 NOTE — Telephone Encounter (Signed)
REFILL 

## 2017-07-19 DIAGNOSIS — F3176 Bipolar disorder, in full remission, most recent episode depressed: Secondary | ICD-10-CM | POA: Diagnosis not present

## 2017-09-27 ENCOUNTER — Other Ambulatory Visit: Payer: Self-pay | Admitting: Cardiovascular Disease

## 2017-09-27 ENCOUNTER — Other Ambulatory Visit: Payer: Self-pay | Admitting: Family Medicine

## 2017-09-27 DIAGNOSIS — E781 Pure hyperglyceridemia: Secondary | ICD-10-CM

## 2017-09-27 DIAGNOSIS — E782 Mixed hyperlipidemia: Secondary | ICD-10-CM

## 2017-09-27 NOTE — Telephone Encounter (Signed)
Rx has been sent to the pharmacy electronically. ° °

## 2017-09-27 NOTE — Telephone Encounter (Signed)
Called and left message for pt to call back and schedule an appt. Denying med per vickie

## 2017-11-24 ENCOUNTER — Other Ambulatory Visit: Payer: Self-pay | Admitting: Family Medicine

## 2017-11-24 DIAGNOSIS — E782 Mixed hyperlipidemia: Secondary | ICD-10-CM

## 2017-11-24 DIAGNOSIS — E781 Pure hyperglyceridemia: Secondary | ICD-10-CM

## 2017-11-26 NOTE — Telephone Encounter (Signed)
Pt coming in Thursday to have a med check with fasting labs. Pt does not need a refill as she just got it refilled

## 2017-11-29 ENCOUNTER — Encounter: Payer: Self-pay | Admitting: Family Medicine

## 2017-11-29 ENCOUNTER — Ambulatory Visit (INDEPENDENT_AMBULATORY_CARE_PROVIDER_SITE_OTHER): Payer: Commercial Managed Care - PPO | Admitting: Family Medicine

## 2017-11-29 VITALS — BP 120/80 | HR 88 | Ht 66.0 in | Wt 232.6 lb

## 2017-11-29 DIAGNOSIS — Z6837 Body mass index (BMI) 37.0-37.9, adult: Secondary | ICD-10-CM

## 2017-11-29 DIAGNOSIS — I428 Other cardiomyopathies: Secondary | ICD-10-CM | POA: Diagnosis not present

## 2017-11-29 DIAGNOSIS — R7303 Prediabetes: Secondary | ICD-10-CM | POA: Diagnosis not present

## 2017-11-29 DIAGNOSIS — F32A Depression, unspecified: Secondary | ICD-10-CM

## 2017-11-29 DIAGNOSIS — E782 Mixed hyperlipidemia: Secondary | ICD-10-CM | POA: Diagnosis not present

## 2017-11-29 DIAGNOSIS — I5042 Chronic combined systolic (congestive) and diastolic (congestive) heart failure: Secondary | ICD-10-CM

## 2017-11-29 DIAGNOSIS — R5383 Other fatigue: Secondary | ICD-10-CM

## 2017-11-29 DIAGNOSIS — H1013 Acute atopic conjunctivitis, bilateral: Secondary | ICD-10-CM

## 2017-11-29 DIAGNOSIS — F329 Major depressive disorder, single episode, unspecified: Secondary | ICD-10-CM

## 2017-11-29 NOTE — Progress Notes (Signed)
   Subjective:    Patient ID: Carolyn Salazar, female    DOB: Apr 06, 1971, 47 y.o.   MRN: [id number]  HPI Chief Complaint  Patient presents with  . fasting med check    fasting med check, eye cold- mucous, sore- thinks its from colds that are going around   She is here for a fasting med check.   Acute complaint of bilateral eye itching and clear drainage for the past few weeks. States she has allergies and this happened to her last year. She reports taking Zyrtec and has tried antihistamine eye drops in the past but not recently. Denies eye pain, URI symptoms. Denies vision changes.   Reports good medication compliance on all medications. No side effects.   Complains of fatigue and sleepiness for the past 2 years. Started on vitamin B complex and sleep has decreased from 12 hours to 10 hours.   She is under the care of a cardiologist, Dr. Oval Linsey for HTN, Chronic systolic heart failure Recommended she follow up with cardiology in July 2019. She plans to call and schedule an appointment.  Requests fasting labs today.   Dr. Benard Rink is her psychiatrist. She sees her every 6-9 months.   Hyperlipidemia- taking statin.  Prediabetes history.  Married and does not work.   Denies fever, chills, dizziness, chest pain, palpitations, shortness of breath, abdominal pain, N/V/D, urinary symptoms, LE edema.   Reviewed allergies, medications, past medical, surgical, family, and social history.  Review of Systems Pertinent positives and negatives in the history of present illness.     Objective:   Physical Exam BP 120/80   Pulse 88   Ht 5' 6"  (1.676 m)   Wt 232 lb 9.6 oz (105.5 kg)   BMI 37.54 kg/m   Alert and in no distress.  Cardiac exam shows a regular sinus rhythm without murmurs or gallops. Lungs are clear to auscultation. Extremities without edema, pulses intact. PERRLA, EOMs intact, normal conjunctiva, no drainage, no edema.        Assessment & Plan:  Mixed hyperlipidemia -  Plan: Lipid panel  Pre-diabetes - Plan: CBC with Differential/Platelet, Comprehensive metabolic panel, TSH, Hemoglobin A1c  Class 2 severe obesity with serious comorbidity and body mass index (BMI) of 37.0 to 37.9 in adult, unspecified obesity type ([place]) - Plan: CBC with Differential/Platelet, Comprehensive metabolic panel, TSH  Nonischemic cardiomyopathy (H[facility])  Chronic combined systolic and diastolic CHF (congestive heart failure) ([place]) - Plan: CBC with Differential/Platelet, Comprehensive metabolic panel, Lipid panel  Allergic conjunctivitis of both eyes  Depression, unspecified depression type  Fatigue, unspecified type - Plan: CBC with Differential/Platelet, Comprehensive metabolic panel, TSH, Hemoglobin A1c, Lipid panel, Vitamin B12, VITAMIN D 25 Hydroxy (Vit-D Deficiency, Fractures)  Good compliance on medications and no side effects.  Check fasting lipids today.  She will follow up with her cardiologist in the next few weeks.  Continue seeing her psychiatrist for depression.  Use the antihistamine eye drops no more than twice daily. Use Johnson's Baby Shampoo to do an eyelid scrub each night. Continue on allergy medication. If you develop worsening symptoms, let me know.  Prediabetes- counseling on healthy diet and exercise. Recheck Hgb A1c Fatigue for at least 2 years. Check labs.  Follow up pending labs.

## 2017-11-29 NOTE — Patient Instructions (Signed)
Use the antihistamine eye drops no more than twice daily. Use Johnson's Baby Shampoo to do an eyelid scrub each night. Continue on allergy medication. If you develop worsening symptoms, let me know.   We will check labs today and have you schedule to see your cardiologist ASAP.

## 2017-11-30 ENCOUNTER — Other Ambulatory Visit: Payer: Self-pay | Admitting: Family Medicine

## 2017-11-30 DIAGNOSIS — E559 Vitamin D deficiency, unspecified: Secondary | ICD-10-CM | POA: Insufficient documentation

## 2017-11-30 LAB — VITAMIN D 25 HYDROXY (VIT D DEFICIENCY, FRACTURES): Vit D, 25-Hydroxy: 8.9 ng/mL — ABNORMAL LOW (ref 30.0–100.0)

## 2017-11-30 LAB — COMPREHENSIVE METABOLIC PANEL
ALBUMIN: 4.3 g/dL (ref 3.5–5.5)
ALT: 38 IU/L — ABNORMAL HIGH (ref 0–32)
AST: 30 IU/L (ref 0–40)
Albumin/Globulin Ratio: 1.8 (ref 1.2–2.2)
Alkaline Phosphatase: 120 IU/L — ABNORMAL HIGH (ref 39–117)
BUN / CREAT RATIO: 21 (ref 9–23)
BUN: 13 mg/dL (ref 6–24)
Bilirubin Total: 0.9 mg/dL (ref 0.0–1.2)
CALCIUM: 9.3 mg/dL (ref 8.7–10.2)
CO2: 19 mmol/L — AB (ref 20–29)
CREATININE: 0.63 mg/dL (ref 0.57–1.00)
Chloride: 99 mmol/L (ref 96–106)
GFR calc Af Amer: 124 mL/min/{1.73_m2} (ref >59)
GFR, EST NON AFRICAN AMERICAN: 108 mL/min/{1.73_m2} (ref >59)
GLOBULIN, TOTAL: 2.4 g/dL (ref 1.5–4.5)
Glucose: 203 mg/dL — ABNORMAL HIGH (ref 65–99)
Potassium: 4.5 mmol/L (ref 3.5–5.2)
SODIUM: 138 mmol/L (ref 134–144)
Total Protein: 6.7 g/dL (ref 6.0–8.5)

## 2017-11-30 LAB — CBC WITH DIFFERENTIAL/PLATELET
Basophils Absolute: 0 10*3/uL (ref 0.0–0.2)
Basos: 0 %
EOS (ABSOLUTE): 0.2 10*3/uL (ref 0.0–0.4)
EOS: 4 %
HEMATOCRIT: 43.5 % (ref 34.0–46.6)
Hemoglobin: 14.2 g/dL (ref 11.1–15.9)
IMMATURE GRANULOCYTES: 0 %
Immature Grans (Abs): 0 10*3/uL (ref 0.0–0.1)
Lymphocytes Absolute: 2.4 10*3/uL (ref 0.7–3.1)
Lymphs: 35 %
MCH: 29.7 pg (ref 26.6–33.0)
MCHC: 32.6 g/dL (ref 31.5–35.7)
MCV: 91 fL (ref 79–97)
Monocytes Absolute: 0.6 10*3/uL (ref 0.1–0.9)
Monocytes: 9 %
NEUTROS PCT: 52 %
Neutrophils Absolute: 3.6 10*3/uL (ref 1.4–7.0)
Platelets: 316 10*3/uL (ref 150–450)
RBC: 4.78 x10E6/uL (ref 3.77–5.28)
RDW: 13.6 % (ref 12.3–15.4)
WBC: 6.9 10*3/uL (ref 3.4–10.8)

## 2017-11-30 LAB — LIPID PANEL
CHOL/HDL RATIO: 4.8 ratio — AB (ref 0.0–4.4)
Cholesterol, Total: 167 mg/dL (ref 100–199)
HDL: 35 mg/dL — AB (ref >39)
LDL Calculated: 71 mg/dL (ref 0–99)
TRIGLYCERIDES: 307 mg/dL — AB (ref 0–149)
VLDL Cholesterol Cal: 61 mg/dL — ABNORMAL HIGH (ref 5–40)

## 2017-11-30 LAB — VITAMIN B12: VITAMIN B 12: 1063 pg/mL (ref 232–1245)

## 2017-11-30 LAB — TSH: TSH: 1.62 u[IU]/mL (ref 0.450–4.500)

## 2017-11-30 LAB — HEMOGLOBIN A1C
Est. average glucose Bld gHb Est-mCnc: 220 mg/dL
HEMOGLOBIN A1C: 9.3 % — AB (ref 4.8–5.6)

## 2017-11-30 MED ORDER — VITAMIN D (ERGOCALCIFEROL) 1.25 MG (50000 UNIT) PO CAPS: 50000.0000 [IU] | ORAL_CAPSULE | ORAL | 0 refills | Status: DC

## 2017-12-03 ENCOUNTER — Encounter: Payer: Self-pay | Admitting: Family Medicine

## 2017-12-03 ENCOUNTER — Other Ambulatory Visit: Payer: Self-pay

## 2017-12-03 ENCOUNTER — Ambulatory Visit (INDEPENDENT_AMBULATORY_CARE_PROVIDER_SITE_OTHER): Payer: Commercial Managed Care - PPO | Admitting: Family Medicine

## 2017-12-03 VITALS — BP 102/68 | HR 86 | Temp 98.5°F | Wt 232.8 lb

## 2017-12-03 DIAGNOSIS — E782 Mixed hyperlipidemia: Secondary | ICD-10-CM | POA: Diagnosis not present

## 2017-12-03 DIAGNOSIS — E119 Type 2 diabetes mellitus without complications: Secondary | ICD-10-CM

## 2017-12-03 DIAGNOSIS — E7800 Pure hypercholesterolemia, unspecified: Secondary | ICD-10-CM

## 2017-12-03 DIAGNOSIS — E559 Vitamin D deficiency, unspecified: Secondary | ICD-10-CM | POA: Diagnosis not present

## 2017-12-03 DIAGNOSIS — E118 Type 2 diabetes mellitus with unspecified complications: Secondary | ICD-10-CM | POA: Insufficient documentation

## 2017-12-03 MED ORDER — GLUCOSE BLOOD VI STRP: 1.0000 | ORAL_STRIP | 3 refills | Status: DC | PRN

## 2017-12-03 MED ORDER — EMPAGLIFLOZIN 10 MG PO TABS: 10.0000 mg | ORAL_TABLET | Freq: Every day | ORAL | 2 refills | Status: DC

## 2017-12-03 MED ORDER — ONETOUCH DELICA LANCETS 33G MISC: 1.0000 | Freq: Once | 3 refills | Status: AC

## 2017-12-03 MED ORDER — METFORMIN HCL 1000 MG PO TABS: 1000.0000 mg | ORAL_TABLET | Freq: Two times a day (BID) | ORAL | 2 refills | Status: DC

## 2017-12-03 NOTE — Patient Instructions (Addendum)
Your hemoglobin A1c is 9.3% and you have new diabetes.   It is time for you to get serious about your diet and cut back on sugary foods and beverages and carbohydrates.  Specifically cut back on bread, pasta, rice, potatoes.  Increase your physical activity.  Even 5 to 10 minutes/day of walking or some type of movement can be beneficial.  Start on the metformin twice daily and Jardiance once daily.  Continue on the lisinopril and statin.  Check your blood sugars once daily as discussed. Only check your blood sugar fasting or 2 hours after a meal. You can mix this up or just take it once daily fasting.  Goal fasting blood sugars 80-130.  Goal blood sugars 2 hours after meals 130-160.  Your vitamin D was severely low.  Continue on the once weekly prescription vitamin D.  Return in 3 weeks and bring in your blood sugar readings and any questions you may have about diabetes.   CALL AND SCHEDULE YOU CARDIOLOGY APPOINTMENT.

## 2017-12-03 NOTE — Progress Notes (Signed)
   Subjective:    Patient ID: Carolyn Salazar, female    DOB: 1971-01-04, 47 y.o.   MRN: [id number]  HPI Chief Complaint  Patient presents with  . other    lab results consult   She is here to follow up on new onset diabetes with a Hgb A1c of 9.3%.  History of prediabetes until now. States she has a lot of knowledge about diabetes due to family history including her daughter. Her husband also has diabetes.   States her diet has been poor, high in carbohydrates. States she is aware of dietary modifications that she should make.   Denies fever, chills, dizziness, unexplained weight loss, vision changes,  chest pain, polyuria, polydipsia, abdominal pain, N/V/D.  She has a history of fatigue.   ALT was mildly elevated.   Severe vitamin D deficiency and now on supplement.   She is on an ARB and statin already. She is also on fenofibrate.   Reviewed allergies, medications, past medical, surgical, family, and social history.     Review of Systems Pertinent positives and negatives in the history of present illness.     Objective:   Physical Exam BP 102/68 (BP Location: Left Arm, Patient Position: Sitting)   Pulse 86   Temp 98.5 F (36.9 C)   Wt 232 lb 12.8 oz (105.6 kg)   SpO2 96%   BMI 37.57 kg/m   Alert and oriented and in no acute distress.       Assessment & Plan:  New onset type 2 diabetes mellitus ([place]) - Plan: empagliflozin (JARDIANCE) 10 MG TABS tablet, metFORMIN (GLUCOPHAGE) 1000 MG tablet  Mixed hyperlipidemia  Vitamin D deficiency  Elevated LDL cholesterol level  New diabetes with Hgb A1c at 9.3%. Counseling done on diabetes diagnosis, management and potential health consequences.  Offered MNT, she declines.  Start on Metformin bid. Discussed options for 2nd medication and she prefers a second oral medication. She requests Jardiance since her daughter is on this medication and it is affordable.  Start checking blood sugars daily. Education done, meter and  supplies given.  She is aware that she needs a diabetic eye exam.  Continue on statin. Suspect triglycerides will improve when BS improves.  ALT should also resolve with BS lower. Will recheck this in 3 weeks at follow up appointment.  Continue on vitamin D supplement.  Follow up in 3 weeks.

## 2017-12-06 ENCOUNTER — Other Ambulatory Visit: Payer: Self-pay | Admitting: Family Medicine

## 2017-12-06 MED ORDER — GLUCOSE BLOOD VI STRP: ORAL_STRIP | 3 refills | Status: DC

## 2017-12-20 LAB — HM DIABETES EYE EXAM

## 2017-12-23 NOTE — Progress Notes (Signed)
   Subjective:    Patient ID: Carolyn Salazar, female    DOB: [birth year], 47 y.o.   MRN: [id number]  HPI Chief Complaint  Patient presents with  . follow-up    running 150-160 in the last week and this morning was 135   She is here to follow up on new onset DM type 2. Hgb A1c 9.3% on 11/29/2017. Previously she was in the prediabetic range.   Started her on Metformin and Jardiance. Reports good daily compliance. No concerns with medications.   Checking her BS at home and readings have been between 135-183.   Diet has improved, states she has cut back on sweets.   She had a diabetic [facility]. Reports no retinopathy.   Vitamin D severely low and taking prescription dose replacement. States she has some GI upset with vitamin D and symptoms wane until her next weekly dose.   Mildly elevated ALT. Will recheck this today and check acute hepatitis panel if still elevated.  Denies alcohol use.   Triglycerides with a normal LDL. Has taken Omega 3 fatty acid in the past but did not tolerate it, states it gave her heartburn.   No new concerns.   Denies fever, chills, dizziness, chest pain, palpitations, shortness of breath, abdominal pain, N/V/D, urinary symptoms, LE edema.   Reviewed allergies, medications, past medical, surgical, family, and social history.   Review of Systems Pertinent positives and negatives in the history of present illness.     Objective:   Physical Exam BP 120/72   Pulse 85   Wt 227 lb 6.4 oz (103.1 kg)   BMI 36.70 kg/m   Alert and oriented and in no acute distress.  Not otherwise examined.      Assessment & Plan:  New onset type 2 diabetes mellitus ([place]) - Plan: Comprehensive metabolic panel  Mixed hyperlipidemia - Plan: fenofibrate (TRICOR) 48 MG tablet  Vitamin D deficiency  Elevated ALT measurement - Plan: Comprehensive metabolic panel  High triglycerides - Plan: fenofibrate (TRICOR) 48 MG tablet  Her  blood sugars are improving.  She reports good compliance and no side effects on metformin and Jardiance.  Would like to continue on these medications. She will start checking her blood sugars either fasting or 2 hours after a meal and keep a record of these. Counseling done on limiting sugar and carbohydrates and increasing her physical activity. She thinks 50,000 international units of vitamin D is causing her to have upset stomach and does not think it is related to the diabetes medication.  I will have her stop this dose and have her try a lower over-the-counter vitamin D supplement to see if her symptoms improve. Slightly elevated ALT we will repeat this today and if still elevated, check acute hepatitis panel.  Limited alcohol use.  Suspect this is related to diabetes and diet. She will discuss elevated triglycerides with normal LDL with her cardiologist.  I will refill her fenofibrate at the current dose for 30 days however I suspect this will be increased. Recent diabetic eye exam.  She reports no retinopathy.  I will attempt to get this record. She will follow-up in mid to late October for a fasting diabetes visit.  We will also recheck her vitamin D level at this visit.

## 2017-12-24 ENCOUNTER — Encounter: Payer: Self-pay | Admitting: Family Medicine

## 2017-12-24 ENCOUNTER — Ambulatory Visit (INDEPENDENT_AMBULATORY_CARE_PROVIDER_SITE_OTHER): Payer: Commercial Managed Care - PPO | Admitting: Family Medicine

## 2017-12-24 VITALS — BP 120/72 | HR 85 | Wt 227.4 lb

## 2017-12-24 DIAGNOSIS — E781 Pure hyperglyceridemia: Secondary | ICD-10-CM

## 2017-12-24 DIAGNOSIS — E782 Mixed hyperlipidemia: Secondary | ICD-10-CM

## 2017-12-24 DIAGNOSIS — R7401 Elevation of levels of liver transaminase levels: Secondary | ICD-10-CM

## 2017-12-24 DIAGNOSIS — E559 Vitamin D deficiency, unspecified: Secondary | ICD-10-CM | POA: Diagnosis not present

## 2017-12-24 DIAGNOSIS — R740 Nonspecific elevation of levels of transaminase and lactic acid dehydrogenase [LDH]: Secondary | ICD-10-CM | POA: Diagnosis not present

## 2017-12-24 DIAGNOSIS — E119 Type 2 diabetes mellitus without complications: Secondary | ICD-10-CM | POA: Diagnosis not present

## 2017-12-24 MED ORDER — FENOFIBRATE 48 MG PO TABS: 48.0000 mg | ORAL_TABLET | Freq: Every day | ORAL | 0 refills | Status: DC

## 2017-12-24 NOTE — Patient Instructions (Addendum)
Continue checking your blood sugar. You may mix up the times of day that you check it. Either fasting or 2 hours after a meal.  Fasting blood sugar goal range is 80-130 and 2 hours after a meal goal range is 130-160.   Continue limiting sugary foods and beverages and carbohydrates. Add in some physical activity.   Continue on your current medications.  You may try a lower dose vitamin D supplement and see if this improves your symptoms. Take 2, 000 or 5, 000 IU daily.   I am checking your liver function testing again today. We will call you with this result.   Follow up in October for a fasting diabetes visit and we will recheck your vitamin D level as well.

## 2017-12-25 ENCOUNTER — Encounter: Payer: Self-pay | Admitting: Family Medicine

## 2017-12-25 LAB — COMPREHENSIVE METABOLIC PANEL
ALK PHOS: 88 IU/L (ref 39–117)
ALT: 33 IU/L — ABNORMAL HIGH (ref 0–32)
AST: 28 IU/L (ref 0–40)
Albumin/Globulin Ratio: 1.8 (ref 1.2–2.2)
Albumin: 4.4 g/dL (ref 3.5–5.5)
BUN/Creatinine Ratio: 12 (ref 9–23)
BUN: 9 mg/dL (ref 6–24)
Bilirubin Total: 0.6 mg/dL (ref 0.0–1.2)
CO2: 21 mmol/L (ref 20–29)
CREATININE: 0.74 mg/dL (ref 0.57–1.00)
Calcium: 9.5 mg/dL (ref 8.7–10.2)
Chloride: 104 mmol/L (ref 96–106)
GFR calc Af Amer: 112 mL/min/{1.73_m2} (ref >59)
GFR calc non Af Amer: 97 mL/min/{1.73_m2} (ref >59)
GLOBULIN, TOTAL: 2.4 g/dL (ref 1.5–4.5)
Glucose: 170 mg/dL — ABNORMAL HIGH (ref 65–99)
Potassium: 4.5 mmol/L (ref 3.5–5.2)
SODIUM: 142 mmol/L (ref 134–144)
Total Protein: 6.8 g/dL (ref 6.0–8.5)

## 2017-12-27 ENCOUNTER — Telehealth: Payer: Self-pay | Admitting: Family Medicine

## 2017-12-27 LAB — HEPATITIS PANEL, ACUTE
HEP B S AG: NEGATIVE
Hep A IgM: NEGATIVE
Hep B C IgM: NEGATIVE
Hep C Virus Ab: 0.3 s/co ratio (ref 0.0–0.9)

## 2017-12-27 LAB — SPECIMEN STATUS REPORT

## 2017-12-27 NOTE — Telephone Encounter (Signed)
Received requested diabetic eye exam info from [facility]. Sending back for review.

## 2017-12-27 NOTE — Telephone Encounter (Signed)
Received diabetic eye exam from [facility]. Sending back for review.

## 2018-01-01 ENCOUNTER — Encounter: Payer: Self-pay | Admitting: Family Medicine

## 2018-01-15 ENCOUNTER — Encounter: Payer: Self-pay | Admitting: Cardiovascular Disease

## 2018-01-15 ENCOUNTER — Ambulatory Visit (INDEPENDENT_AMBULATORY_CARE_PROVIDER_SITE_OTHER): Payer: Commercial Managed Care - PPO | Admitting: Cardiovascular Disease

## 2018-01-15 VITALS — BP 122/72 | HR 88 | Ht 66.0 in | Wt 224.6 lb

## 2018-01-15 DIAGNOSIS — I10 Essential (primary) hypertension: Secondary | ICD-10-CM | POA: Diagnosis not present

## 2018-01-15 DIAGNOSIS — Z5181 Encounter for therapeutic drug level monitoring: Secondary | ICD-10-CM | POA: Diagnosis not present

## 2018-01-15 DIAGNOSIS — E781 Pure hyperglyceridemia: Secondary | ICD-10-CM

## 2018-01-15 DIAGNOSIS — I5042 Chronic combined systolic (congestive) and diastolic (congestive) heart failure: Secondary | ICD-10-CM

## 2018-01-15 DIAGNOSIS — Z79899 Other long term (current) drug therapy: Secondary | ICD-10-CM

## 2018-01-15 DIAGNOSIS — F3176 Bipolar disorder, in full remission, most recent episode depressed: Secondary | ICD-10-CM | POA: Diagnosis not present

## 2018-01-15 MED ORDER — ATORVASTATIN CALCIUM 20 MG PO TABS: 20.0000 mg | ORAL_TABLET | Freq: Every day | ORAL | 3 refills | Status: DC

## 2018-01-15 MED ORDER — ICOSAPENT ETHYL 1 G PO CAPS: 2.0000 g | ORAL_CAPSULE | Freq: Two times a day (BID) | ORAL | 5 refills | Status: DC

## 2018-01-15 NOTE — Progress Notes (Signed)
Cardiology Office Note   Date:  01/15/2018   ID:  Carolyn Salazar, DOB 1971-02-14, MRN [id number]  PCP:  Girtha Rm, NP-C  Cardiologist:   Skeet Latch, MD   No chief complaint on file.    Patient ID: Carolyn Salazar is a 47 y.o. 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  Carolyn Salazar is a patient who previously saw Dr. Ron Parker.  She was diagnosed with systolic heart failure, LVEF 20% in 2000.  On repeat echo in 2014 revealed LVEF 50-55% and normal diastolic function.   Carolyn Salazar reports undergoing cardiac cath at the [facility] in 2000 that was negative for ischemia.  She later had stress testing in 2007 that was negative for ischemia, though none recently.  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 She was seen 07/2015 and reported exertional chest pain.  She was referred for a Lexiscan Myoview that revealed LVEF 57% with anterior, apical and apical lateral infarct with peri-infarct ischemia.  She was referred for left heart catheterization 08/25/15 that revealed LVEF 45-50% with diffuse hypokinesis and no identifiable CAD.  LVEDP was 3 mmHg.  Since her last appointment Carolyn Salazar has been well.  She has not experienced any chest pain or shortness of breath.  She denies lower extremity edema, orthopnea, or PND.  She has not been exercising.  She is afraid that if she does too much she will have heart symptoms.  She saw her PCP 12/24/17 and was noted to have elevated triglycerides to 305.  Her hemoglobin A1c was 9.3%.  She was diagnosed with diabetes 11/2017 and started on metformin and Jardiance.  She was also noted to have very mildly elevated ALT.  She does not use much Tylenol and drinks alcohol once every few weeks.    Past Medical History:  Diagnosis Date  . Arthritis    Hands, Knees RT>LT  . BENIGN NEOPLASM OF SKIN SITE  UNSPECIFIED   . BUNIONS, BILATERAL   . CARDIOMYOPATHY 02/1999   EF 20% in 02/1999, improved over time. Echo 01/2006 - EF 50-55% EF 04/2008 35-40% EF 40-45% Echo 07/22/2009  . Chest pain    Nuclear, 2007, normal ( catheterization in 2000, normal coronary arteries)  . DEPRESSION   . DIABETES MELLITUS, TYPE II, CONTROLLED, MILD   . DYSLIPIDEMIA   . Ejection fraction    EF 20%, echo, 2000  //   EF 50%, echo, 2007  //   EF 35-40%, echo, 2009  //   EF 40-45%, echo, 2011  //   EF 40-45%, echo, December, 2012  . Hypotension    November, 2012  . METABOLIC SYNDROME X    hypertriglycerides 04/2008, hyperglycemia  . MIGRAINE HEADACHE   . OBESITY   . Shingles (herpes zoster) polyneuropathy   . Sinus tachycardia     Past Surgical History:  Procedure Laterality Date  . BUNIONECTOMY  2012   LEFT  . BUNIONECTOMY Right   . CARDIAC CATHETERIZATION    . CARDIAC CATHETERIZATION N/A 08/25/2015   Procedure: Left Heart Cath and Coronary Angiography;  Surgeon: Troy Sine, MD;  Location: [facility];  Service: Cardiovascular;  Laterality: N/A;  . heart biopsy    . vein scope       Current Outpatient Medications  Medication Sig Dispense Refill  . ARIPiprazole (ABILIFY) 5 MG tablet Take 7.5 mg by mouth  daily.     . b complex vitamins tablet Take 1 tablet by mouth daily.    Marland Kitchen BIOTIN PO Take by mouth.    . carvedilol (COREG) 25 MG tablet TAKE 1 TABLET BY MOUTH TWO  TIMES DAILY WITH A MEAL 180 tablet 1  . cetirizine (ZYRTEC) 10 MG tablet Take 10 mg by mouth daily.      . digoxin (LANOXIN) 0.125 MG tablet TAKE 1 TABLET BY MOUTH  DAILY 90 tablet 1  . empagliflozin (JARDIANCE) 10 MG TABS tablet Take 10 mg by mouth daily. 30 tablet 2  . Esomeprazole Magnesium (NEXIUM PO) Take 1 capsule by mouth daily. Reported on 07/21/2015    . FLUoxetine (PROZAC) 40 MG capsule Take 1 capsule (40 mg total) by mouth daily. (Patient taking differently: Take 80 mg by mouth daily. ) 90 capsule 3  . furosemide (LASIX)  40 MG tablet TAKE 1 TABLET BY MOUTH  DAILY 90 tablet 0  . Glucosamine-Chondroitin (GLUCOSAMINE CHONDR COMPLEX) 500-400 MG CAPS Take 1 tablet by mouth daily.     Marland Kitchen glucose blood test strip Test once a day. Pt uses one touch verio meter 100 each 3  . lamoTRIgine (LAMICTAL) 200 MG tablet Take 400 mg by mouth daily.     Marland Kitchen lisinopril (PRINIVIL,ZESTRIL) 2.5 MG tablet TAKE 1 TABLET BY MOUTH  DAILY 90 tablet 1  . metFORMIN (GLUCOPHAGE) 1000 MG tablet Take 1 tablet (1,000 mg total) by mouth 2 (two) times daily with a meal. 60 tablet 2  . potassium chloride SA (K-DUR,KLOR-CON) 20 MEQ tablet TAKE 1 TABLET BY MOUTH  DAILY 90 tablet 1  . Vitamin D, Ergocalciferol, (DRISDOL) 50000 units CAPS capsule Take 1 capsule (50,000 Units total) by mouth every 7 (seven) days. 12 capsule 0  . zolpidem (AMBIEN) 10 MG tablet Take 10 mg by mouth at bedtime.     Marland Kitchen atorvastatin (LIPITOR) 20 MG tablet Take 1 tablet (20 mg total) by mouth daily. 90 tablet 3  . Icosapent Ethyl 1 g CAPS Take 2 capsules (2 g total) by mouth 2 (two) times daily. WITH FOOD 120 capsule 5   Current Facility-Administered Medications  Medication Dose Route Frequency Provider Last Rate Last Dose  . levonorgestrel (MIRENA) 20 MCG/24HR IUD   Intrauterine Once Terrance Mass, MD        Allergies:   Sulfonamide derivatives    Social History:  The patient  reports that she has never smoked. She has never used smokeless tobacco. She reports that she drinks about 4.0 standard drinks of alcohol per week. She reports that she does not use drugs.   Family History:  The patient's family history includes Arthritis in her other and sister; Cancer in her mother; Diabetes in her father and mother; Diabetes type II in her mother; Heart attack in her father; Heart failure in her father; Hyperlipidemia in her mother and other; Hypertension in her other.    ROS:  Please see the history of present illness.   Otherwise, review of systems are positive for none.   All  other systems are reviewed and negative.    PHYSICAL EXAM: VS:  BP 122/72   Pulse 88   Ht 5' 6"  (1.676 m)   Wt 224 lb 9.6 oz (101.9 kg)   BMI 36.25 kg/m  , BMI Body mass index is 36.25 kg/m. GENERAL:  Well appearing HEENT: Pupils equal round and reactive, fundi not visualized, oral mucosa unremarkable NECK:  No jugular venous distention, waveform within normal limits,  carotid upstroke brisk and symmetric, no bruits LUNGS:  Clear to auscultation bilaterally HEART:  RRR.  PMI not displaced or sustained,S1 and S2 within normal limits, no S3, no S4, no clicks, no rubs, no murmurs ABD:  Flat, positive bowel sounds normal in frequency in pitch, no bruits, no rebound, no guarding, no midline pulsatile mass, no hepatomegaly, no splenomegaly EXT:  2 plus pulses throughout, no edema, no cyanosis no clubbing SKIN:  No rashes no nodules NEURO:  Cranial nerves II through XII grossly intact, motor grossly intact throughout PSYCH:  Cognitively intact, oriented to person place and time   EKG:  EKG is ordered today. The ekg ordered today demonstrates sinus rhythm rate 90 bpm.  Early R wave progression,  Cannot rule out prior inferior infarct.  Non-specific ST-T changes. 11/27/16 sinus rhythm. Rate 91 bpm. Nonspecific ST/T changes 01/15/18: Sinus rhythm.  Rate 88 bpm.  Nonspecific ST/T changes.    Lexiscan Cardiolite 08/05/15:  The left ventricular ejection fraction is normal (55-65%).  Nuclear stress EF: 57%.  There was no ST segment deviation noted during stress.  No T wave inversion was noted during stress.  Defect 1: There is a large defect of moderate severity.  Findings consistent with prior myocardial infarction with peri-infarct ischemia.  This is an intermediate risk study.  Large size, moderate intensity partially reversible anterior, apical and apical lateral perfusion defect. LVEF 57% with normal wall motion. This could be consistent with anteroapical scar and lateral ischemia or  shifting breast artifact. Clinical correlation advised. This is an intermediate risk study.   Recent Labs: 11/29/2017: Hemoglobin 14.2; Platelets 316; TSH 1.620 12/24/2017: ALT 33; BUN 9; Creatinine, Ser 0.74; Potassium 4.5; Sodium 142    Lipid Panel    Component Value Date/Time   CHOL 167 11/29/2017 1207   TRIG 307 (H) 11/29/2017 1207   HDL 35 (L) 11/29/2017 1207   CHOLHDL 4.8 (H) 11/29/2017 1207   CHOLHDL 4.5 03/29/2016 0907   VLDL 78 (H) 03/29/2016 0907   LDLCALC 71 11/29/2017 1207   LDLDIRECT 130.8 01/15/2014 1209   Chol 163, tri 247, hdl 44, ldl 91    Wt Readings from Last 3 Encounters:  01/15/18 224 lb 9.6 oz (101.9 kg)  12/24/17 227 lb 6.4 oz (103.1 kg)  12/03/17 232 lb 12.8 oz (105.6 kg)      ASSESSMENT AND PLAN:  # Exertional chest pain: # Abnormal stress test: No recent chest pain.  No CAD on cath 08/2015.   # Chronic systolic heart failure, LVEF improved: LVEF 45-50% on cath and 57% on nuclear stress testing.  She is euvolemic on exam. Continue carvedilol 25 mg twice a day, Lasix 40 mg daily, and lisinopril 2.5 mg daily.  She will return for CMP and digoxin level.  Consider stopping digoxin at follow up.  Continue Jardiance for improved CV outcomes.   # Hypertension:  Blood pressure is well-controlled. Continue carvedilol 25 mg bid and lisinopril 2.5 mg daily.   # Hyperlipidemia:  # Hypertriglyceridemia: Levels are likely elevated 2/2 obesity and diabetes.  She had a mild transaminitis with no identified cause.  We will stop simvastatin.  Repeat LFTs in 2 weeks.  If normalized we will try rosuvastatin 78m.  Start Vascepa 2g bid.  Repeat lipids and CMP in 3 months.  We discussed the importance of exercise and limiting carbohydrate intake.  She will try to increase her fish consumption.   # Morbid obesity: Encouraged to increase her physical activity.    Current medicines  are reviewed at length with the patient today.  The patient does not have concerns  regarding medicines.  The following changes have been made:  None  Labs/ tests ordered today include:   Orders Placed This Encounter  Procedures  . Hepatic function panel  . Digoxin level  . Lipid panel  . Comprehensive metabolic panel  . EKG 12-Lead     Disposition:   FU with Paelyn Smick C. Oval Linsey, MD, [place] in 3 months.     Signed, Elliot Meldrum C. Oval Linsey, MD, [facility]  01/15/2018 10:47 AM    [place]

## 2018-01-15 NOTE — Patient Instructions (Addendum)
Medication Instructions:  STOP FENOFIBRATE   STOP SIMVASTATIN   START VASCEPA 1 GRAM 2 CAPSULES TWICE A DAY WITH FOOD  START ATORVASTATIN 20 MG DAILY AFTER FOLLOW UP LABS REPORTED   Labwork: LFT/DIGOXIN LEVEL 2 WEEKS AFTER STOPPING SIMVASTATIN. COME IN THE MORNING AND DO NOT TAKE YOUR MORNING DOSE OF DIGOXIN  FASTING LP/CMET IN 3 MONTHS 1 WEEK PRIOR TO FOLLOW UP   Testing/Procedures: NONE  Follow-Up: Your physician recommends that you schedule a follow-up appointment in 3 MONTHS   If you need a refill on your cardiac medications before your next appointment, please call your pharmacy.

## 2018-01-25 ENCOUNTER — Other Ambulatory Visit: Payer: Self-pay | Admitting: Cardiovascular Disease

## 2018-01-26 ENCOUNTER — Other Ambulatory Visit: Payer: Self-pay | Admitting: Cardiovascular Disease

## 2018-01-31 LAB — DIGOXIN LEVEL: DIGOXIN, SERUM: 0.4 ng/mL — AB (ref 0.5–0.9)

## 2018-01-31 LAB — HEPATIC FUNCTION PANEL
ALT: 40 IU/L — ABNORMAL HIGH (ref 0–32)
AST: 26 IU/L (ref 0–40)
Albumin: 4.3 g/dL (ref 3.5–5.5)
Alkaline Phosphatase: 82 IU/L (ref 39–117)
BILIRUBIN, DIRECT: 0.12 mg/dL (ref 0.00–0.40)
Bilirubin Total: 0.5 mg/dL (ref 0.0–1.2)
Total Protein: 6.5 g/dL (ref 6.0–8.5)

## 2018-02-07 ENCOUNTER — Other Ambulatory Visit: Payer: Self-pay

## 2018-02-07 DIAGNOSIS — R7989 Other specified abnormal findings of blood chemistry: Secondary | ICD-10-CM

## 2018-02-07 DIAGNOSIS — R945 Abnormal results of liver function studies: Secondary | ICD-10-CM

## 2018-02-07 DIAGNOSIS — R748 Abnormal levels of other serum enzymes: Secondary | ICD-10-CM

## 2018-02-07 NOTE — Progress Notes (Signed)
Notes recorded by Skeet Latch, MD on 02/06/2018 at 11:36 AM EDT Liver function remains mildly elevated. Continue to hold statin and refer to GI for elevated liver enzymes. Digoxin level is okay.

## 2018-02-11 ENCOUNTER — Ambulatory Visit (INDEPENDENT_AMBULATORY_CARE_PROVIDER_SITE_OTHER): Payer: Commercial Managed Care - PPO | Admitting: Gastroenterology

## 2018-02-11 ENCOUNTER — Encounter: Payer: Self-pay | Admitting: Gastroenterology

## 2018-02-11 ENCOUNTER — Other Ambulatory Visit (INDEPENDENT_AMBULATORY_CARE_PROVIDER_SITE_OTHER): Payer: Commercial Managed Care - PPO

## 2018-02-11 VITALS — BP 100/60 | HR 95 | Ht 66.0 in | Wt 227.0 lb

## 2018-02-11 DIAGNOSIS — R945 Abnormal results of liver function studies: Secondary | ICD-10-CM

## 2018-02-11 DIAGNOSIS — R7989 Other specified abnormal findings of blood chemistry: Secondary | ICD-10-CM

## 2018-02-11 LAB — PROTIME-INR
INR: 0.9 ratio (ref 0.8–1.0)
PROTHROMBIN TIME: 11 s (ref 9.6–13.1)

## 2018-02-11 LAB — IGA: IgA: 267 mg/dL (ref 68–378)

## 2018-02-11 LAB — SEDIMENTATION RATE: Sed Rate: 29 mm/h — ABNORMAL HIGH (ref 0–20)

## 2018-02-11 LAB — FERRITIN: Ferritin: 46.4 ng/mL (ref 10.0–291.0)

## 2018-02-11 NOTE — Patient Instructions (Signed)
Your provider has requested that you go to the basement level for lab work before leaving today. Press "B" on the elevator. The lab is located at the first door on the left as you exit the elevator.  You have been scheduled for an abdominal ultrasound at [facility] (1st floor of hospital) on 02/18/2018 at 8am. Please arrive 15 minutes prior to your appointment for registration. Make certain not to have anything to eat or drink 6 hours prior to your appointment. Should you need to reschedule your appointment, please contact radiology at [phone]. This test typically takes about 30 minutes to perform.  Follow up in 6 months  If you are age 46 or older, your body mass index should be between 23-30. Your Body mass index is 36.64 kg/m. If this is out of the aforementioned range listed, please consider follow up with your Primary Care Provider.  If you are age 30 or younger, your body mass index should be between 19-25. Your Body mass index is 36.64 kg/m. If this is out of the aformentioned range listed, please consider follow up with your Primary Care Provider.    Thank you for choosing [facility]  Karleen Hampshire Nandigam,MD

## 2018-02-11 NOTE — Progress Notes (Signed)
Carolyn Salazar    [id number]    02/22/1971  Primary Care Physician:Henson, Laurian Brim, NP-C  Referring Physician: Girtha Rm, NP-C [place], [place] [id number]  Chief complaint: Abnormal LFT HPI: 47 year old female with history of obesity, nonischemic cardiomyopathy, CHF, recent onset type 2 diabetes, A1c 9.3%, vitamin D deficiency and elevated triglycerides here for evaluation of abnormal LFT Alk phos peaked at 120 November 29, 2017 trended down to 82 on January 30, 2018 AST and ALT elevated to 30s to 40s from baseline of teens to 63s Denies any abdominal pain, nausea, vomiting, change in bowel habits, loss of appetite or weight loss.  She stopped taking statin about 3 to 4 weeks ago.  Denies excessive use of alcohol, has a glass of wine once every couple weeks.  No herbal remedies or weight loss medication.  She takes Excedrin about twice weekly for migraine headaches. No family history of gallstones or GI malignancy.  December 24, 2017:AST 28, ALT 33, total bilirubin 0.6 and alk phos 88 Hepatitis a antibody to IgM, hepatitis B core antibody IgM, hepatitis B surface antigen and hepatitis C antibody negative Total globulin 2.4.  Digoxin serum level 0.4. Ferritin 132 in January 2015.  INR 1.02 in March 2017.  TSH 2.06 and free T4 1.2 within normal range March 2017  Outpatient Encounter Medications as of 02/11/2018  Medication Sig  . ARIPiprazole (ABILIFY) 5 MG tablet Take 7.5 mg by mouth daily.   Marland Kitchen atorvastatin (LIPITOR) 20 MG tablet Take 1 tablet (20 mg total) by mouth daily.  Marland Kitchen b complex vitamins tablet Take 1 tablet by mouth daily.  Marland Kitchen BIOTIN PO Take by mouth.  . carvedilol (COREG) 25 MG tablet TAKE 1 TABLET BY MOUTH TWO  TIMES DAILY WITH A MEAL  . cetirizine (ZYRTEC) 10 MG tablet Take 10 mg by mouth daily.    . digoxin (LANOXIN) 0.125 MG tablet TAKE 1 TABLET BY MOUTH  DAILY  . empagliflozin (JARDIANCE) 10 MG TABS tablet Take 10 mg by mouth daily.  .  Esomeprazole Magnesium (NEXIUM PO) Take 1 capsule by mouth daily. Reported on 07/21/2015  . FLUoxetine (PROZAC) 40 MG capsule Take 1 capsule (40 mg total) by mouth daily. (Patient taking differently: Take 80 mg by mouth daily. )  . furosemide (LASIX) 40 MG tablet TAKE 1 TABLET BY MOUTH  DAILY  . Glucosamine-Chondroitin (GLUCOSAMINE CHONDR COMPLEX) 500-400 MG CAPS Take 1 tablet by mouth daily.   Marland Kitchen glucose blood test strip Test once a day. Pt uses one touch verio meter  . Icosapent Ethyl 1 g CAPS Take 2 capsules (2 g total) by mouth 2 (two) times daily. WITH FOOD  . lamoTRIgine (LAMICTAL) 200 MG tablet Take 400 mg by mouth daily.   Marland Kitchen lisinopril (PRINIVIL,ZESTRIL) 2.5 MG tablet TAKE 1 TABLET BY MOUTH  DAILY  . metFORMIN (GLUCOPHAGE) 1000 MG tablet Take 1 tablet (1,000 mg total) by mouth 2 (two) times daily with a meal.  . potassium chloride SA (K-DUR,KLOR-CON) 20 MEQ tablet TAKE 1 TABLET BY MOUTH  DAILY  . Vitamin D, Ergocalciferol, (DRISDOL) 50000 units CAPS capsule Take 1 capsule (50,000 Units total) by mouth every 7 (seven) days.  Marland Kitchen zolpidem (AMBIEN) 10 MG tablet Take 10 mg by mouth at bedtime.    Facility-Administered Encounter Medications as of 02/11/2018  Medication  . levonorgestrel (MIRENA) 20 MCG/24HR IUD    Allergies as of 02/11/2018 - Review Complete 02/11/2018  Allergen Reaction Noted  .  Sulfonamide derivatives Swelling     Past Medical History:  Diagnosis Date  . Arthritis    Hands, Knees RT>LT  . BENIGN NEOPLASM OF SKIN SITE UNSPECIFIED   . BUNIONS, BILATERAL   . CARDIOMYOPATHY 02/1999   EF 20% in 02/1999, improved over time. Echo 01/2006 - EF 50-55% EF 04/2008 35-40% EF 40-45% Echo 07/22/2009  . Chest pain    Nuclear, 2007, normal ( catheterization in 2000, normal coronary arteries)  . DEPRESSION   . DIABETES MELLITUS, TYPE II, CONTROLLED, MILD   . DYSLIPIDEMIA   . Ejection fraction    EF 20%, echo, 2000  //   EF 50%, echo, 2007  //   EF 35-40%, echo, 2009  //   EF  40-45%, echo, 2011  //   EF 40-45%, echo, December, 2012  . Hypotension    November, 2012  . METABOLIC SYNDROME X    hypertriglycerides 04/2008, hyperglycemia  . MIGRAINE HEADACHE   . OBESITY   . Shingles (herpes zoster) polyneuropathy   . Sinus tachycardia     Past Surgical History:  Procedure Laterality Date  . BUNIONECTOMY  2012   LEFT  . BUNIONECTOMY Right   . CARDIAC CATHETERIZATION    . CARDIAC CATHETERIZATION N/A 08/25/2015   Procedure: Left Heart Cath and Coronary Angiography;  Surgeon: Troy Sine, MD;  Location: [facility];  Service: Cardiovascular;  Laterality: N/A;  . heart biopsy    . vein scope      Family History  Problem Relation Age of Onset  . Diabetes Mother   . Cancer Mother        Ovarian, benign mass  . Diabetes type II Mother   . Hyperlipidemia Mother   . Diabetes Father   . Heart failure Father   . Heart attack Father   . Hypertension Other        Parent  . Hyperlipidemia Other        parent  . Arthritis Other        parent, grandparent  . Arthritis Sister     Social History   Socioeconomic History  . Marital status: Married    Spouse name: Not on file  . Number of children: Not on file  . Years of education: Not on file  . Highest education level: Not on file  Occupational History  . Not on file  Social Needs  . Financial resource strain: Not on file  . Food insecurity:    Worry: Not on file    Inability: Not on file  . Transportation needs:    Medical: Not on file    Non-medical: Not on file  Tobacco Use  . Smoking status: Never Smoker  . Smokeless tobacco: Never Used  Substance and Sexual Activity  . Alcohol use: Yes    Alcohol/week: 4.0 standard drinks    Types: 4 Glasses of wine per week    Comment: 1 glass of wine every other week.   . Drug use: No  . Sexual activity: Yes    Birth control/protection: Other-see comments    Comment: TUBAL LIGATION, 1st intercourse- 20, partners- 3  Lifestyle  . Physical  activity:    Days per week: Not on file    Minutes per session: Not on file  . Stress: Not on file  Relationships  . Social connections:    Talks on phone: Not on file    Gets together: Not on file    Attends religious service: Not on  file    Active member of club or organization: Not on file    Attends meetings of clubs or organizations: Not on file    Relationship status: Not on file  . Intimate partner violence:    Fear of current or ex partner: Not on file    Emotionally abused: Not on file    Physically abused: Not on file    Forced sexual activity: Not on file  Other Topics Concern  . Not on file  Social History Narrative   Pt lives with her spouse, daughter and college friend.       Epworth Sleepiness Scale = 10 (as of 07/21/2015)      Review of systems: Review of Systems  Constitutional: Negative for fever and chills.  HENT: Negative.   Eyes: Negative for blurred vision.  Respiratory: Negative for cough, shortness of breath and wheezing.   Cardiovascular: Negative for chest pain and palpitations.  Gastrointestinal: as per HPI Genitourinary: Negative for dysuria, urgency, frequency and hematuria.  Musculoskeletal: Negative for myalgias, back pain and joint pain.  Skin: Negative for itching and rash.  Neurological: Negative for dizziness, tremors, focal weakness, seizures and loss of consciousness.  Endo/Heme/Allergies: Positive for seasonal allergies.  Psychiatric/Behavioral: Negative for depression, suicidal ideas and hallucinations.  All other systems reviewed and are negative.   Physical Exam: Vitals:   02/11/18 1342  BP: 100/60  Pulse: 95   Body mass index is 36.64 kg/m. Gen:      No acute distress HEENT:  EOMI, sclera anicteric Neck:     No masses; no thyromegaly Lungs:    Clear to auscultation bilaterally; normal respiratory effort CV:         Regular rate and rhythm; no murmurs Abd:      + bowel sounds; soft, non-tender; no palpable masses, no  distension Ext:    No edema; adequate peripheral perfusion Skin:      Warm and dry; no rash Neuro: alert and oriented x 3 Psych: normal mood and affect  Data Reviewed:  Reviewed labs, radiology imaging, old records and pertinent past GI work up   Assessment and Plan/Recommendations:  47 year old female with obesity, recently diagnosed type 2 diabetes, A1c 9.3%, hypertriglyceridemia here for evaluation of elevated transaminases likely secondary to fatty liver/[place] in the setting of metabolic syndrome Acute viral hepatitis panel negative We will check ANA, anti-smooth muscle antibody, antimitochondrial antibody, alpha-1 antitrypsin level, ferritin, PT/INR, TTG IgA antibody to exclude underlying chronic liver disease Also check abdominal ultrasound to exclude gallbladder disease or lesions in the liver Advised patient to monitor diet and avoid simple sugars, better glycemic control with goal A1c less than 7% Avoid alcohol and herbal supplements Return in 6 months or sooner if needed   Greater than 50% of the time used for counseling as well as treatment plan and follow-up. She had multiple questions which were answered to her satisfaction  K. Denzil Magnuson , MD [phone]    CC: Girtha Rm, NP-C

## 2018-02-13 LAB — ANA: Anti Nuclear Antibody(ANA): NEGATIVE

## 2018-02-14 ENCOUNTER — Other Ambulatory Visit: Payer: Self-pay | Admitting: *Deleted

## 2018-02-14 DIAGNOSIS — R748 Abnormal levels of other serum enzymes: Secondary | ICD-10-CM

## 2018-02-14 LAB — ALPHA-1-ANTITRYPSIN: A-1 Antitrypsin, Ser: 111 mg/dL (ref 83–199)

## 2018-02-14 LAB — ANTI-SMOOTH MUSCLE ANTIBODY, IGG: Actin (Smooth Muscle) Antibody (IGG): <20 U (ref <20)

## 2018-02-14 LAB — MITOCHONDRIAL ANTIBODIES

## 2018-02-14 LAB — TISSUE TRANSGLUTAMINASE, IGA: (tTG) Ab, IgA: 1 U/mL

## 2018-02-18 ENCOUNTER — Ambulatory Visit (HOSPITAL_COMMUNITY): Admission: RE | Admit: 2018-02-18 | Payer: Commercial Managed Care - PPO | Source: Ambulatory Visit

## 2018-02-20 ENCOUNTER — Other Ambulatory Visit: Payer: Self-pay | Admitting: *Deleted

## 2018-02-20 DIAGNOSIS — R945 Abnormal results of liver function studies: Principal | ICD-10-CM

## 2018-02-20 DIAGNOSIS — R7989 Other specified abnormal findings of blood chemistry: Secondary | ICD-10-CM

## 2018-02-21 ENCOUNTER — Ambulatory Visit (HOSPITAL_COMMUNITY): Payer: Commercial Managed Care - PPO | Attending: Gastroenterology

## 2018-02-23 ENCOUNTER — Other Ambulatory Visit: Payer: Self-pay | Admitting: Family Medicine

## 2018-02-23 DIAGNOSIS — E559 Vitamin D deficiency, unspecified: Secondary | ICD-10-CM

## 2018-02-27 ENCOUNTER — Other Ambulatory Visit: Payer: Self-pay | Admitting: Family Medicine

## 2018-02-27 DIAGNOSIS — E119 Type 2 diabetes mellitus without complications: Secondary | ICD-10-CM

## 2018-02-27 NOTE — Telephone Encounter (Signed)
appt end of october

## 2018-03-04 ENCOUNTER — Telehealth: Payer: Self-pay | Admitting: Family Medicine

## 2018-03-04 DIAGNOSIS — E119 Type 2 diabetes mellitus without complications: Secondary | ICD-10-CM

## 2018-03-04 MED ORDER — METFORMIN HCL 1000 MG PO TABS: ORAL_TABLET | ORAL | 0 refills | Status: DC

## 2018-03-04 NOTE — Telephone Encounter (Signed)
  Fax refill request from [facility]  Metformin   90 day supply

## 2018-03-04 NOTE — Telephone Encounter (Signed)
done

## 2018-03-06 ENCOUNTER — Other Ambulatory Visit: Payer: Self-pay | Admitting: Family Medicine

## 2018-03-06 DIAGNOSIS — E119 Type 2 diabetes mellitus without complications: Secondary | ICD-10-CM

## 2018-03-18 ENCOUNTER — Ambulatory Visit: Payer: Commercial Managed Care - PPO | Admitting: Family Medicine

## 2018-03-19 NOTE — Progress Notes (Signed)
Subjective:    Patient ID: Carolyn Salazar, female    DOB: [birth year], 47 y.o.   MRN: [id number]  Carolyn Salazar is a 47 y.o. female who presents for follow-up of Type 2 diabetes mellitus and other chronic health conditions.  Vitamin D deficiency. 2 weeks off of vitamin D and needs this rechecked.  States her cardiologist recently stopped her statin due to elevated ALT, started her on Vascepa and referred her to GI for further evaluation.  She has not followed up to have her liver ultrasound yet but plans to schedule this.  States she is anxious about possibly returning to work after being on disability for the past 20 years.  On medication for depression and anxiety and followed by her psychiatrist.  Patient is checking home blood sugars.   Home blood sugar records: BGs range between 135 and 155 How often is blood sugars being checked: every morning Current symptoms include: weight loss. Patient denies.  Patient is not checking their feet daily. Any Foot concerns (callous, ulcer, wound, thickened nails, toenail fungus, skin fungus, hammer toe): none Last dilated eye exam: august 2019   Current treatments: Metformin 1000 mg twice daily and Jardiance.  She does report having some diarrhea Medication compliance: good  Current diet: in general, a "healthy" diet   Current exercise: none Known diabetic complications: none  The following portions of the patient's history were reviewed and updated as appropriate: allergies, current medications, past medical history, past social history and problem list.  ROS as in subjective above.     Objective:    Physical Exam Alert and in no distress otherwise not examined.  Blood pressure 120/80, pulse 89, weight 222 lb 9.6 oz (101 kg).  Lab Review Diabetic Labs Latest Ref Rng & Units 03/20/2018 12/24/2017 11/29/2017 03/29/2016 08/18/2015  HbA1c 4.0 - 5.6 % 5.9(A) - 9.3(H) 6.2(H) -  Microalbumin 0.0 - 1.9 mg/dL - - - - -  Micro/Creat Ratio 0.0  - 30.0 mg/g - - - - -  Chol 100 - 199 mg/dL - - 167 179 -  HDL >39 mg/dL - - 35(L) 40(L) -  Calc LDL 0 - 99 mg/dL - - 71 61 -  Triglycerides 0 - 149 mg/dL - - 307(H) 391(H) -  Creatinine 0.57 - 1.00 mg/dL - 0.74 0.63 - 0.76  GFR >60.00 mL/min - - - - -   BP/Weight 03/20/2018 02/11/2018 01/15/2018 12/24/2017 6/50/3546  Systolic BP 568 127 517 001 749  Diastolic BP 80 60 72 72 68  Wt. (Lbs) 222.6 227 224.6 227.4 232.8  BMI 35.93 36.64 36.25 36.7 37.57   Foot/eye exam completion dates Latest Ref Rng & Units 12/20/2017 12/20/2017  Eye Exam No Retinopathy No Retinopathy No Retinopathy  Foot Form Completion - - -    Samuella  reports that she has never smoked. She has never used smokeless tobacco. She reports that she drinks about 4.0 standard drinks of alcohol per week. She reports that she does not use drugs.     Assessment & Plan:    New onset type 2 diabetes mellitus ([place]) - Plan: HgB A1c, Comprehensive metabolic panel, metFORMIN (GLUCOPHAGE) 500 MG tablet  Needs flu shot - Plan: Flu Vaccine QUAD 36+ mos IM  Mixed hyperlipidemia - Plan: Lipid panel  Vitamin D deficiency - Plan: VITAMIN D 25 Hydroxy (Vit-D Deficiency, Fractures)  Depression, unspecified depression type  Elevated ALT measurement - Plan: Comprehensive metabolic panel  Medication management - Plan: VITAMIN D 25 Hydroxy (Vit-D Deficiency, Fractures), Lipid  panel  1. Rx changes: Reduce metformin dosing to 500 mg twice daily and see if diarrhea improves.  Continue on Jardiance hemoglobin A1c has improved significantly and is 5.9% today.  Congratulated her on making healthy lifestyle changes and weight loss. 2. Education: Reviewed 'ABCs' of diabetes management (respective goals in parentheses):  A1C (<7), blood pressure (<130/80), and cholesterol (LDL <100). 3. Compliance at present is estimated to be good. Efforts to improve compliance (if necessary) will be directed at dietary modifications: Continue to cut back on sugar and  carbohydrates as she has and increased exercise. 4. Hyperlipidemia-currently statin is being held due to recent ALT elevation and she is taking Vascepa.  She will follow-up with GI for further evaluation including ultrasound. 5. Vitamin D deficiency-has been on once weekly 50,000 international units of vitamin D and completed this 2 weeks ago.  Adjust vitamin D dose pending labs 6. Depression and anxiety-encouraged her to schedule with her therapist 7. Flu shot given 8. Follow up: 4 weeks

## 2018-03-20 ENCOUNTER — Encounter: Payer: Self-pay | Admitting: Family Medicine

## 2018-03-20 ENCOUNTER — Ambulatory Visit (INDEPENDENT_AMBULATORY_CARE_PROVIDER_SITE_OTHER): Payer: Commercial Managed Care - PPO | Admitting: Family Medicine

## 2018-03-20 VITALS — BP 120/80 | HR 89 | Wt 222.6 lb

## 2018-03-20 DIAGNOSIS — F329 Major depressive disorder, single episode, unspecified: Secondary | ICD-10-CM | POA: Diagnosis not present

## 2018-03-20 DIAGNOSIS — E119 Type 2 diabetes mellitus without complications: Secondary | ICD-10-CM | POA: Diagnosis not present

## 2018-03-20 DIAGNOSIS — E782 Mixed hyperlipidemia: Secondary | ICD-10-CM

## 2018-03-20 DIAGNOSIS — R740 Nonspecific elevation of levels of transaminase and lactic acid dehydrogenase [LDH]: Secondary | ICD-10-CM

## 2018-03-20 DIAGNOSIS — F32A Depression, unspecified: Secondary | ICD-10-CM

## 2018-03-20 DIAGNOSIS — E559 Vitamin D deficiency, unspecified: Secondary | ICD-10-CM

## 2018-03-20 DIAGNOSIS — R7401 Elevation of levels of liver transaminase levels: Secondary | ICD-10-CM

## 2018-03-20 DIAGNOSIS — Z79899 Other long term (current) drug therapy: Secondary | ICD-10-CM

## 2018-03-20 DIAGNOSIS — Z23 Encounter for immunization: Secondary | ICD-10-CM

## 2018-03-20 LAB — POCT GLYCOSYLATED HEMOGLOBIN (HGB A1C): Hemoglobin A1C: 5.9 % — AB (ref 4.0–5.6)

## 2018-03-20 MED ORDER — METFORMIN HCL 500 MG PO TABS: 500.0000 mg | ORAL_TABLET | Freq: Two times a day (BID) | ORAL | 2 refills | Status: DC

## 2018-03-20 NOTE — Patient Instructions (Signed)
Your hemoglobin A1c is 5.9% and your diabetes is well controlled. Congratulations on making healthy lifestyle changes.  You can cut back the metformin dose to 500 mg twice daily with meals and see if this helps with the GI symptoms.  Call and schedule with your counselor  We will call you with your lab results.  Please call and reschedule the ultrasound and follow-up with Dr. Juliann Mule as scheduled.  Return to see me in 4 weeks or sooner if needed.

## 2018-03-21 LAB — COMPREHENSIVE METABOLIC PANEL
ALBUMIN: 4.4 g/dL (ref 3.5–5.5)
ALT: 25 IU/L (ref 0–32)
AST: 26 IU/L (ref 0–40)
Albumin/Globulin Ratio: 1.9 (ref 1.2–2.2)
Alkaline Phosphatase: 78 IU/L (ref 39–117)
BUN/Creatinine Ratio: 20 (ref 9–23)
BUN: 10 mg/dL (ref 6–24)
Bilirubin Total: 0.7 mg/dL (ref 0.0–1.2)
CALCIUM: 9.5 mg/dL (ref 8.7–10.2)
CO2: 25 mmol/L (ref 20–29)
CREATININE: 0.51 mg/dL — AB (ref 0.57–1.00)
Chloride: 99 mmol/L (ref 96–106)
GFR calc Af Amer: 133 mL/min/{1.73_m2} (ref >59)
GFR, EST NON AFRICAN AMERICAN: 116 mL/min/{1.73_m2} (ref >59)
GLOBULIN, TOTAL: 2.3 g/dL (ref 1.5–4.5)
GLUCOSE: 127 mg/dL — AB (ref 65–99)
Potassium: 4.3 mmol/L (ref 3.5–5.2)
SODIUM: 139 mmol/L (ref 134–144)
Total Protein: 6.7 g/dL (ref 6.0–8.5)

## 2018-03-21 LAB — LIPID PANEL
CHOLESTEROL TOTAL: 193 mg/dL (ref 100–199)
Chol/HDL Ratio: 4.7 ratio — ABNORMAL HIGH (ref 0.0–4.4)
HDL: 41 mg/dL (ref >39)
LDL CALC: 103 mg/dL — AB (ref 0–99)
Triglycerides: 245 mg/dL — ABNORMAL HIGH (ref 0–149)
VLDL CHOLESTEROL CAL: 49 mg/dL — AB (ref 5–40)

## 2018-03-21 LAB — VITAMIN D 25 HYDROXY (VIT D DEFICIENCY, FRACTURES): VIT D 25 HYDROXY: 20.1 ng/mL — AB (ref 30.0–100.0)

## 2018-03-28 ENCOUNTER — Encounter: Payer: Self-pay | Admitting: Family Medicine

## 2018-03-28 ENCOUNTER — Ambulatory Visit (INDEPENDENT_AMBULATORY_CARE_PROVIDER_SITE_OTHER): Payer: Commercial Managed Care - PPO | Admitting: Family Medicine

## 2018-03-28 VITALS — BP 120/80 | HR 84 | Temp 97.8°F | Resp 16 | Wt 219.4 lb

## 2018-03-28 DIAGNOSIS — J0140 Acute pansinusitis, unspecified: Secondary | ICD-10-CM | POA: Diagnosis not present

## 2018-03-28 DIAGNOSIS — R05 Cough: Secondary | ICD-10-CM

## 2018-03-28 DIAGNOSIS — R059 Cough, unspecified: Secondary | ICD-10-CM

## 2018-03-28 MED ORDER — BENZONATATE 200 MG PO CAPS: 200.0000 mg | ORAL_CAPSULE | Freq: Two times a day (BID) | ORAL | 0 refills | Status: DC | PRN

## 2018-03-28 MED ORDER — AMOXICILLIN 875 MG PO TABS: 875.0000 mg | ORAL_TABLET | Freq: Two times a day (BID) | ORAL | 0 refills | Status: DC

## 2018-03-28 NOTE — Progress Notes (Signed)
Subjective:  Carolyn Salazar is a 47 y.o. female who presents for a one week history of sinus pressure, frontal headache, fatigue, nasal congestion, rhinorrhea, post nasal drainage, scratchy throat and dry cough. Some wheezing.  Does not smoke. No underlying asthma. History of bronchitis.  Treating underlying allergies with Zyrtec.  No recent antibiotics.  Denies fever, chills, ear pain, dizziness, chest pain, palpitations, shortness of breath, abdominal pain, N/V/D.   Treatment to date: antihistamines, cough suppressants, decongestants and Tylenol.  Denies sick contacts.  No other aggravating or relieving factors.  No other c/o.  ROS as in subjective.   Objective: Vitals:   03/28/18 1320  BP: 120/80  Pulse: 84  Resp: 16  Temp: 97.8 F (36.6 C)  SpO2: 97%    General appearance: Alert, WD/WN, no distress, mildly ill appearing                             Skin: warm, no rash,                            Head: + frontal and maxillary sinus tenderness                            Eyes: conjunctiva normal, corneas clear, PERRLA                            Ears: pearly TMs, external ear canals normal                          Nose: septum midline, turbinates swollen, with erythema and thick discharge             Mouth/throat: MMM, tongue normal, mild pharyngeal erythema                           Neck: supple, no adenopathy, no thyromegaly, nontender                          Heart: RRR, normal S1, S2, no murmurs                         Lungs: CTA bilaterally, no wheezes, rales, or rhonchi      Assessment: Acute non-recurrent pansinusitis - Plan: amoxicillin (AMOXIL) 875 MG tablet  Cough - Plan: benzonatate (TESSALON) 200 MG capsule    Plan: Discussed diagnosis and treatment of acute sinusitis and cough. Amoxil and Tessalon prescribed. Suggested symptomatic OTC remedies.Nasal saline spray for congestion.  Tylenol or Ibuprofen OTC for fever and malaise.  Call/return if worse or not back  to baseline after completing the antibiotic.

## 2018-04-11 ENCOUNTER — Other Ambulatory Visit: Payer: Self-pay | Admitting: Family Medicine

## 2018-04-11 DIAGNOSIS — E119 Type 2 diabetes mellitus without complications: Secondary | ICD-10-CM

## 2018-04-13 ENCOUNTER — Other Ambulatory Visit: Payer: Self-pay | Admitting: Family Medicine

## 2018-04-13 DIAGNOSIS — E119 Type 2 diabetes mellitus without complications: Secondary | ICD-10-CM

## 2018-04-15 ENCOUNTER — Ambulatory Visit: Payer: Commercial Managed Care - PPO | Admitting: Cardiovascular Disease

## 2018-04-24 ENCOUNTER — Ambulatory Visit: Payer: Commercial Managed Care - PPO | Admitting: Family Medicine

## 2018-04-25 ENCOUNTER — Ambulatory Visit: Payer: Commercial Managed Care - PPO | Admitting: Cardiovascular Disease

## 2018-05-17 ENCOUNTER — Encounter: Payer: Self-pay | Admitting: Family Medicine

## 2018-06-11 LAB — LIPID PANEL
Chol/HDL Ratio: 5 ratio — ABNORMAL HIGH (ref 0.0–4.4)
Cholesterol, Total: 199 mg/dL (ref 100–199)
HDL: 40 mg/dL (ref >39)
LDL CALC: 84 mg/dL (ref 0–99)
TRIGLYCERIDES: 375 mg/dL — AB (ref 0–149)
VLDL CHOLESTEROL CAL: 75 mg/dL — AB (ref 5–40)

## 2018-06-11 LAB — COMPREHENSIVE METABOLIC PANEL
ALT: 20 IU/L (ref 0–32)
AST: 16 IU/L (ref 0–40)
Albumin/Globulin Ratio: 1.8 (ref 1.2–2.2)
Albumin: 4 g/dL (ref 3.8–4.8)
Alkaline Phosphatase: 67 IU/L (ref 39–117)
BUN / CREAT RATIO: 16 (ref 9–23)
BUN: 9 mg/dL (ref 6–24)
Bilirubin Total: 0.5 mg/dL (ref 0.0–1.2)
CALCIUM: 9.7 mg/dL (ref 8.7–10.2)
CO2: 24 mmol/L (ref 20–29)
Chloride: 101 mmol/L (ref 96–106)
Creatinine, Ser: 0.57 mg/dL (ref 0.57–1.00)
GFR, EST AFRICAN AMERICAN: 129 mL/min/{1.73_m2} (ref >59)
GFR, EST NON AFRICAN AMERICAN: 112 mL/min/{1.73_m2} (ref >59)
GLOBULIN, TOTAL: 2.2 g/dL (ref 1.5–4.5)
GLUCOSE: 129 mg/dL — AB (ref 65–99)
Potassium: 4 mmol/L (ref 3.5–5.2)
SODIUM: 141 mmol/L (ref 134–144)
TOTAL PROTEIN: 6.2 g/dL (ref 6.0–8.5)

## 2018-06-13 ENCOUNTER — Ambulatory Visit (INDEPENDENT_AMBULATORY_CARE_PROVIDER_SITE_OTHER): Payer: Commercial Managed Care - PPO | Admitting: Cardiovascular Disease

## 2018-06-13 ENCOUNTER — Encounter: Payer: Self-pay | Admitting: Cardiovascular Disease

## 2018-06-13 VITALS — BP 110/70 | HR 98 | Ht 66.0 in | Wt 224.6 lb

## 2018-06-13 DIAGNOSIS — I10 Essential (primary) hypertension: Secondary | ICD-10-CM | POA: Diagnosis not present

## 2018-06-13 DIAGNOSIS — Z5181 Encounter for therapeutic drug level monitoring: Secondary | ICD-10-CM | POA: Diagnosis not present

## 2018-06-13 DIAGNOSIS — E781 Pure hyperglyceridemia: Secondary | ICD-10-CM

## 2018-06-13 DIAGNOSIS — I5042 Chronic combined systolic (congestive) and diastolic (congestive) heart failure: Secondary | ICD-10-CM | POA: Diagnosis not present

## 2018-06-13 MED ORDER — ATORVASTATIN CALCIUM 10 MG PO TABS: 10.0000 mg | ORAL_TABLET | Freq: Every day | ORAL | 3 refills | Status: DC

## 2018-06-13 MED ORDER — ATORVASTATIN CALCIUM 10 MG PO TABS: 10.0000 mg | ORAL_TABLET | Freq: Every day | ORAL | 5 refills | Status: DC

## 2018-06-13 NOTE — Patient Instructions (Addendum)
Medication Instructions:  START ATORVASTATIN 10 MG DAILY   STOP DIGOXIN   If you need a refill on your cardiac medications before your next appointment, please call your pharmacy.   Lab work: FASTING LP/CMET IN ABOUT 6 WEEKS  If you have labs (blood work) drawn today and your tests are completely normal, you will receive your results only by: Marland Kitchen MyChart Message (if you have MyChart) OR . A paper copy in the mail If you have any lab test that is abnormal or we need to change your treatment, we will call you to review the results.  Testing/Procedures: NONE  Follow-Up: At [facility], you and your health needs are our priority.  As part of our continuing mission to provide you with exceptional heart care, we have created designated Provider Care Teams.  These Care Teams include your primary Cardiologist (physician) and Advanced Practice Providers (APPs -  Physician Assistants and Nurse Practitioners) who all work together to provide you with the care you need, when you need it. You will need a follow up appointment in 4 months. You may see Skeet Latch, MD or one of the following Advanced Practice Providers on your designated Care Team:   Kerin Ransom, PA-C Roby Lofts, [place] . Sande Rives, PA-C

## 2018-06-13 NOTE — Progress Notes (Signed)
Cardiology Office Note   Date:  06/13/2018   ID:  Arcadia Gorgas, DOB 18-May-1971, MRN [id number]  PCP:  Girtha Rm, NP-C  Cardiologist:   Skeet Latch, MD   No chief complaint on file.    Patient ID: Carolyn Salazar is a 48 y.o. 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  Carolyn Salazar is a patient who previously saw Dr. Ron Parker.  She was diagnosed with systolic heart failure, LVEF 20% in 2000.  On repeat echo in 2014 revealed LVEF 50-55% and normal diastolic function.   Carolyn Salazar reports undergoing cardiac cath at the [facility] in 2000 that was negative for ischemia.  She later had stress testing in 2007 that was negative for ischemia, though none recently.  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 She was seen 07/2015 and reported exertional chest pain.  She was referred for a Lexiscan Myoview that revealed LVEF 57% with anterior, apical and apical lateral infarct with peri-infarct ischemia.  She was referred for left heart catheterization 08/25/15 that revealed LVEF 45-50% with diffuse hypokinesis and no identifiable CAD.  LVEDP was 3 mmHg.  At her last appointment Carolyn Salazar was noted to have elevated LFTs.  Both fenofibrate and simvastatin were increased.  Her LFTs remained elevated so she was referred to Dr. Silverio Decamp.  It was felt that her transaminase elevated was attributable to fatty liver/NASH in the setting of metabolic syndrome.  Abdominal ultrasound is pending.  She was started on Vascepa and her statin has been held.  She also worked on her glucose control and hemoglobin A1c has improved from 9.3% to 5.9%.  She has been trying to walk twice per week for about 20 minutes.  She has no exertional symptoms.  She denies lower extremity edema, orthopnea, or PND.  She has not experienced any chest pain or pressure.   Past  Medical History:  Diagnosis Date  . Arthritis    Hands, Knees RT>LT  . BENIGN NEOPLASM OF SKIN SITE UNSPECIFIED   . BUNIONS, BILATERAL   . CARDIOMYOPATHY 02/1999   EF 20% in 02/1999, improved over time. Echo 01/2006 - EF 50-55% EF 04/2008 35-40% EF 40-45% Echo 07/22/2009  . Chest pain    Nuclear, 2007, normal ( catheterization in 2000, normal coronary arteries)  . DEPRESSION   . DIABETES MELLITUS, TYPE II, CONTROLLED, MILD   . DYSLIPIDEMIA   . Ejection fraction    EF 20%, echo, 2000  //   EF 50%, echo, 2007  //   EF 35-40%, echo, 2009  //   EF 40-45%, echo, 2011  //   EF 40-45%, echo, December, 2012  . Hypotension    November, 2012  . METABOLIC SYNDROME X    hypertriglycerides 04/2008, hyperglycemia  . MIGRAINE HEADACHE   . OBESITY   . Shingles (herpes zoster) polyneuropathy   . Sinus tachycardia     Past Surgical History:  Procedure Laterality Date  . BUNIONECTOMY  2012   LEFT  . BUNIONECTOMY Right   . CARDIAC CATHETERIZATION    . CARDIAC CATHETERIZATION N/A 08/25/2015   Procedure: Left Heart Cath and Coronary Angiography;  Surgeon: Troy Sine, MD;  Location: [facility];  Service: Cardiovascular;  Laterality: N/A;  . heart biopsy    . vein scope       Current Outpatient Medications  Medication Sig Dispense Refill  .  amoxicillin (AMOXIL) 875 MG tablet Take 1 tablet (875 mg total) by mouth 2 (two) times daily. 20 tablet 0  . ARIPiprazole (ABILIFY) 5 MG tablet Take 7.5 mg by mouth daily.     Marland Kitchen atorvastatin (LIPITOR) 10 MG tablet Take 1 tablet (10 mg total) by mouth daily. 30 tablet 5  . b complex vitamins tablet Take 1 tablet by mouth daily.    . benzonatate (TESSALON) 200 MG capsule Take 1 capsule (200 mg total) by mouth 2 (two) times daily as needed for cough. 20 capsule 0  . BIOTIN PO Take by mouth.    . carvedilol (COREG) 25 MG tablet TAKE 1 TABLET BY MOUTH TWO  TIMES DAILY WITH A MEAL 180 tablet 3  . cetirizine (ZYRTEC) 10 MG tablet Take 10 mg by mouth  daily.      . Esomeprazole Magnesium (NEXIUM PO) Take 1 capsule by mouth daily. Reported on 07/21/2015    . FLUoxetine (PROZAC) 40 MG capsule Take 1 capsule (40 mg total) by mouth daily. (Patient taking differently: Take 80 mg by mouth daily. ) 90 capsule 3  . furosemide (LASIX) 40 MG tablet TAKE 1 TABLET BY MOUTH  DAILY 90 tablet 3  . Glucosamine-Chondroitin (GLUCOSAMINE CHONDR COMPLEX) 500-400 MG CAPS Take 1 tablet by mouth daily.     Marland Kitchen glucose blood test strip Test once a day. Pt uses one touch verio meter 100 each 3  . Icosapent Ethyl 1 g CAPS Take 2 capsules (2 g total) by mouth 2 (two) times daily. WITH FOOD 120 capsule 5  . JARDIANCE 10 MG TABS tablet TAKE 1 TABLET BY MOUTH DAILY 30 tablet 2  . lamoTRIgine (LAMICTAL) 200 MG tablet Take 400 mg by mouth daily.     Marland Kitchen lisinopril (PRINIVIL,ZESTRIL) 2.5 MG tablet TAKE 1 TABLET BY MOUTH  DAILY 90 tablet 1  . metFORMIN (GLUCOPHAGE) 500 MG tablet Take 1 tablet (500 mg total) by mouth 2 (two) times daily with a meal. 180 tablet 2  . potassium chloride SA (K-DUR,KLOR-CON) 20 MEQ tablet TAKE 1 TABLET BY MOUTH  DAILY 90 tablet 1  . zolpidem (AMBIEN) 10 MG tablet Take 10 mg by mouth at bedtime.      Current Facility-Administered Medications  Medication Dose Route Frequency Provider Last Rate Last Dose  . levonorgestrel (MIRENA) 20 MCG/24HR IUD   Intrauterine Once Terrance Mass, MD        Allergies:   Sulfonamide derivatives    Social History:  The patient  reports that she has never smoked. She has never used smokeless tobacco. She reports current alcohol use of about 4.0 standard drinks of alcohol per week. She reports that she does not use drugs.   Family History:  The patient's family history includes Arthritis in her sister and another family member; Cancer in her mother; Diabetes in her father and mother; Diabetes type II in her mother; Heart attack in her father; Heart failure in her father; Hyperlipidemia in her mother and another family  member; Hypertension in an other family member.    ROS:  Please see the history of present illness.   Otherwise, review of systems are positive for none.   All other systems are reviewed and negative.    PHYSICAL EXAM: VS:  BP 110/70   Pulse 98   Ht 5' 6"  (1.676 m)   Wt 224 lb 9.6 oz (101.9 kg)   BMI 36.25 kg/m  , BMI Body mass index is 36.25 kg/m. GENERAL:  Well appearing  HEENT: Pupils equal round and reactive, fundi not visualized, oral mucosa unremarkable NECK:  No jugular venous distention, waveform within normal limits, carotid upstroke brisk and symmetric, no bruits LUNGS:  Clear to auscultation bilaterally HEART:  RRR.  PMI not displaced or sustained,S1 and S2 within normal limits, no S3, no S4, no clicks, no rubs, no murmurs ABD:  Flat, positive bowel sounds normal in frequency in pitch, no bruits, no rebound, no guarding, no midline pulsatile mass, no hepatomegaly, no splenomegaly EXT:  2 plus pulses throughout, no edema, no cyanosis no clubbing SKIN:  No rashes no nodules NEURO:  Cranial nerves II through XII grossly intact, motor grossly intact throughout PSYCH:  Cognitively intact, oriented to person place and time   EKG:  EKG is not ordered today. The ekg ordered today demonstrates sinus rhythm rate 90 bpm.  Early R wave progression,  Cannot rule out prior inferior infarct.  Non-specific ST-T changes. 11/27/16 sinus rhythm. Rate 91 bpm. Nonspecific ST/T changes 01/15/18: Sinus rhythm.  Rate 88 bpm.  Nonspecific ST/T changes.    Lexiscan Cardiolite 08/05/15:  The left ventricular ejection fraction is normal (55-65%).  Nuclear stress EF: 57%.  There was no ST segment deviation noted during stress.  No T wave inversion was noted during stress.  Defect 1: There is a large defect of moderate severity.  Findings consistent with prior myocardial infarction with peri-infarct ischemia.  This is an intermediate risk study.  Large size, moderate intensity partially  reversible anterior, apical and apical lateral perfusion defect. LVEF 57% with normal wall motion. This could be consistent with anteroapical scar and lateral ischemia or shifting breast artifact. Clinical correlation advised. This is an intermediate risk study.   Recent Labs: 11/29/2017: Hemoglobin 14.2; Platelets 316; TSH 1.620 06/11/2018: ALT 20; BUN 9; Creatinine, Ser 0.57; Potassium 4.0; Sodium 141    Lipid Panel    Component Value Date/Time   CHOL 199 06/11/2018 0901   TRIG 375 (H) 06/11/2018 0901   HDL 40 06/11/2018 0901   CHOLHDL 5.0 (H) 06/11/2018 0901   CHOLHDL 4.5 03/29/2016 0907   VLDL 78 (H) 03/29/2016 0907   LDLCALC 84 06/11/2018 0901   LDLDIRECT 130.8 01/15/2014 1209   Chol 163, tri 247, hdl 44, ldl 91    Wt Readings from Last 3 Encounters:  06/13/18 224 lb 9.6 oz (101.9 kg)  03/28/18 219 lb 6.4 oz (99.5 kg)  03/20/18 222 lb 9.6 oz (101 kg)      ASSESSMENT AND PLAN:  # Exertional chest pain: # Abnormal stress test: No recent chest pain.  No CAD on cath 08/2015.   # Chronic systolic heart failure, LVEF improved: LVEF 45-50% on cath and 57% on nuclear stress testing.  She is euvolemic on exam and has no heart failure symptoms.  We will stop digoxin.  Continue carvedilol 25 mg twice a day, Lasix 40 mg daily, and lisinopril 2.5 mg daily.  Continue Jardiance for improved CV outcomes.   # Hypertension:  Blood pressure is well-controlled. Continue carvedilol 25 mg bid and lisinopril 2.5 mg daily.   # Hyperlipidemia:  # Hypertriglyceridemia: Levels are likely elevated 2/2 obesity and diabetes.  She had a mild transaminitis, thought to be due to NASH/fatty liver.  This improved after holding simvastatin and fenofibrate.  Triglycerides have actually worsened since starting Vascepa, which is especially given that her glucose control is now better.  We will plan to start atorvastatin 10 mg nightly.  Repeat lipids and a CMP in 6 weeks.  Her liver ultrasound is pending.  If  her triglycerides remain elevated on follow-up we will stop Vascepa.  # Morbid obesity: Encouraged to increase her physical activity. She was congratulated on starting her walking and encouraged to increase to 150 minutes weekly.   Current medicines are reviewed at length with the patient today.  The patient does not have concerns regarding medicines.  The following changes have been made: Start atorvastatin 10 mg daily.  Labs/ tests ordered today include:   Orders Placed This Encounter  Procedures  . Lipid panel  . Comprehensive metabolic panel     Disposition:   FU with Mikya Don C. Oval Linsey, MD, [facility] in 4 months.     Signed, Shin Lamour C. Oval Linsey, MD, [facility]  06/13/2018 4:27 PM    [place]

## 2018-06-14 ENCOUNTER — Telehealth: Payer: Self-pay

## 2018-06-14 NOTE — Telephone Encounter (Signed)
[facility] pharmacy has sent a fax requesting a refill on Jardiance. Please advise.

## 2018-06-14 NOTE — Telephone Encounter (Signed)
Ok to refill until her follow up.

## 2018-06-17 ENCOUNTER — Other Ambulatory Visit: Payer: Self-pay | Admitting: Cardiovascular Disease

## 2018-06-17 NOTE — Telephone Encounter (Signed)
Patient will come in on Wednesday for med check.

## 2018-06-19 ENCOUNTER — Encounter: Payer: Commercial Managed Care - PPO | Admitting: Family Medicine

## 2018-06-27 ENCOUNTER — Encounter: Payer: Self-pay | Admitting: Family Medicine

## 2018-07-24 ENCOUNTER — Telehealth: Payer: Self-pay | Admitting: Cardiovascular Disease

## 2018-07-24 NOTE — Telephone Encounter (Signed)
Message sent to Dr.[place]'s nurse.

## 2018-07-24 NOTE — Telephone Encounter (Signed)
  [employer] calling to verify we received authorization paperwork and if we need assistance with auth. REF #[id number]

## 2018-07-25 NOTE — Telephone Encounter (Signed)
Reviewed patients dictation from visit in January. Unclear if patient is currently taking Vascepa. Left message to call back

## 2018-08-09 ENCOUNTER — Ambulatory Visit (INDEPENDENT_AMBULATORY_CARE_PROVIDER_SITE_OTHER): Payer: Commercial Managed Care - PPO | Admitting: Family Medicine

## 2018-08-09 ENCOUNTER — Encounter: Payer: Self-pay | Admitting: Family Medicine

## 2018-08-09 ENCOUNTER — Other Ambulatory Visit: Payer: Self-pay

## 2018-08-09 VITALS — BP 110/70 | HR 98 | Wt 223.8 lb

## 2018-08-09 DIAGNOSIS — E119 Type 2 diabetes mellitus without complications: Secondary | ICD-10-CM | POA: Diagnosis not present

## 2018-08-09 DIAGNOSIS — E782 Mixed hyperlipidemia: Secondary | ICD-10-CM

## 2018-08-09 DIAGNOSIS — E118 Type 2 diabetes mellitus with unspecified complications: Secondary | ICD-10-CM

## 2018-08-09 LAB — POCT GLYCOSYLATED HEMOGLOBIN (HGB A1C): Hemoglobin A1C: 5.9 % — AB (ref 4.0–5.6)

## 2018-08-09 MED ORDER — EMPAGLIFLOZIN 10 MG PO TABS: 10.0000 mg | ORAL_TABLET | Freq: Every day | ORAL | 1 refills | Status: DC

## 2018-08-09 MED ORDER — METFORMIN HCL 1000 MG PO TABS: 1000.0000 mg | ORAL_TABLET | Freq: Two times a day (BID) | ORAL | 1 refills | Status: DC

## 2018-08-09 NOTE — Progress Notes (Signed)
  Subjective:    Patient ID: Carolyn Salazar, female    DOB: 1971-03-14, 48 y.o.   MRN: [id number]  Carolyn Salazar is a 48 y.o. female who presents for follow-up of Type 2 diabetes mellitus.  Patient is checking home blood sugars.   Home blood sugar records: BGs range between 124 and 130 How often is blood sugars being checked: every other day Current symptoms include: none. Patient denies increased appetite, nausea, visual disturbances, vomiting and weight loss.  Patient is checking their feet daily. Any Foot concerns (callous, ulcer, wound, thickened nails, toenail fungus, skin fungus, hammer toe): none Last dilated eye exam: august 2019-   Current treatments: DM med doing well. Jardiance 10 mg, Metformin 1,000 mg bid Medication compliance: good  Current diet: in general, a "healthy" diet   Current exercise: none Known diabetic complications: none   Taking atorvastatin daily with no concerns for hyperlipidemia and triglycerides. Cardiologist is managing this.   The following portions of the patient's history were reviewed and updated as appropriate: allergies, current medications, past medical history, past social history and problem list.  ROS as in subjective above.     Objective:    Physical Exam Alert and in no distress otherwise not examined.  Blood pressure 110/70, pulse 98, weight 223 lb 12.8 oz (101.5 kg), SpO2 98 %.  Lab Review Diabetic Labs Latest Ref Rng & Units 08/09/2018 06/11/2018 03/20/2018 12/24/2017 11/29/2017  HbA1c 4.0 - 5.6 % 5.9(A) - 5.9(A) - 9.3(H)  Microalbumin 0.0 - 1.9 mg/dL - - - - -  Micro/Creat Ratio 0.0 - 30.0 mg/g - - - - -  Chol 100 - 199 mg/dL - 199 193 - 167  HDL >39 mg/dL - 40 41 - 35(L)  Calc LDL 0 - 99 mg/dL - 84 103(H) - 71  Triglycerides 0 - 149 mg/dL - 375(H) 245(H) - 307(H)  Creatinine 0.57 - 1.00 mg/dL - 0.57 0.51(L) 0.74 0.63  GFR >60.00 mL/min - - - - -   BP/Weight 08/09/2018 06/13/2018 03/28/2018 03/20/2018 1/59/4585  Systolic BP  929 244 628 638 177  Diastolic BP 70 70 80 80 60  Wt. (Lbs) 223.8 224.6 219.4 222.6 227  BMI 36.12 36.25 35.41 35.93 36.64   Foot/eye exam completion dates Latest Ref Rng & Units 12/20/2017 12/20/2017  Eye Exam No Retinopathy No Retinopathy No Retinopathy  Foot Form Completion - - -    Carolyn Salazar  reports that she has never smoked. She has never used smokeless tobacco. She reports current alcohol use of about 4.0 standard drinks of alcohol per week. She reports that she does not use drugs.     Assessment & Plan:    Controlled type 2 diabetes mellitus with complication, without long-term current use of insulin ([place]) - Plan: HgB A1c  Mixed hyperlipidemia  New onset type 2 diabetes mellitus ([place]) - Plan: empagliflozin (JARDIANCE) 10 MG TABS tablet  1. Rx changes: none Hgb A1c 5.9%  2. Education: Reviewed 'ABCs' of diabetes management (respective goals in parentheses):  A1C (<7), blood pressure (<130/80), and cholesterol (LDL <100). 3. Compliance at present is estimated to be good. Efforts to improve compliance (if necessary) will be directed at dietary modifications: continue on low carbohydrate diet and increased exercise. 4. Follow up: 6 months for CPE and diabetes follow up

## 2018-08-09 NOTE — Patient Instructions (Addendum)
Your hemoglobin A1c is 5.9% and your diabetes is well controlled.

## 2018-08-13 ENCOUNTER — Telehealth: Payer: Self-pay | Admitting: Cardiovascular Disease

## 2018-08-13 NOTE — Telephone Encounter (Signed)
Reference#[id number]     Please call her, concerning prior authorization for Vascepa

## 2018-08-14 NOTE — Telephone Encounter (Signed)
Spoke with patient and she has not been taking Vascepa since she showed no improvement taking. She did start the Atorvastatin and will come for labs soon secondary to elevated liver functions in the past

## 2018-08-14 NOTE — Telephone Encounter (Signed)
Follow up   Patient is returning your call.

## 2018-08-14 NOTE — Telephone Encounter (Signed)
Had left message for patient to call back to confirm she was still taking Vascepa, another message left today. Did ask covermymeds to start another PA. While trying to do prior authorization it was asking if patients baseline of triglycerides greater than 500 and if improvement on Vascepa, both are no. Will await patients call back

## 2018-08-14 NOTE — Telephone Encounter (Signed)
Patient returned you call. Patient can be reached at [phone]

## 2018-08-14 NOTE — Telephone Encounter (Signed)
See 3/24 phone note

## 2018-08-14 NOTE — Telephone Encounter (Signed)
Left message to call back  

## 2018-08-16 LAB — LIPID PANEL
CHOLESTEROL TOTAL: 155 mg/dL (ref 100–199)
Chol/HDL Ratio: 3.2 ratio (ref 0.0–4.4)
HDL: 48 mg/dL (ref >39)
LDL Calculated: 66 mg/dL (ref 0–99)
TRIGLYCERIDES: 204 mg/dL — AB (ref 0–149)
VLDL Cholesterol Cal: 41 mg/dL — ABNORMAL HIGH (ref 5–40)

## 2018-08-16 LAB — COMPREHENSIVE METABOLIC PANEL
A/G RATIO: 1.9 (ref 1.2–2.2)
ALBUMIN: 4.4 g/dL (ref 3.8–4.8)
ALT: 27 IU/L (ref 0–32)
AST: 20 IU/L (ref 0–40)
Alkaline Phosphatase: 89 IU/L (ref 39–117)
BILIRUBIN TOTAL: 0.6 mg/dL (ref 0.0–1.2)
BUN / CREAT RATIO: 18 (ref 9–23)
BUN: 12 mg/dL (ref 6–24)
CALCIUM: 9.6 mg/dL (ref 8.7–10.2)
CHLORIDE: 102 mmol/L (ref 96–106)
CO2: 22 mmol/L (ref 20–29)
Creatinine, Ser: 0.67 mg/dL (ref 0.57–1.00)
GFR, EST AFRICAN AMERICAN: 122 mL/min/{1.73_m2} (ref >59)
GFR, EST NON AFRICAN AMERICAN: 106 mL/min/{1.73_m2} (ref >59)
GLOBULIN, TOTAL: 2.3 g/dL (ref 1.5–4.5)
Glucose: 130 mg/dL — ABNORMAL HIGH (ref 65–99)
POTASSIUM: 3.9 mmol/L (ref 3.5–5.2)
SODIUM: 141 mmol/L (ref 134–144)
TOTAL PROTEIN: 6.7 g/dL (ref 6.0–8.5)

## 2018-08-19 ENCOUNTER — Telehealth: Payer: Self-pay | Admitting: Cardiovascular Disease

## 2018-08-19 NOTE — Telephone Encounter (Signed)
New Medication            Carrolyn Meiers is calling from [employer] for a refill on "Vascepa" 78m she is needing a refill authorization. Pls call and advise.

## 2018-08-19 NOTE — Telephone Encounter (Signed)
Spoke with cover my meds, PA cancelled. Per previous telephone note, patient is not taking the med at this time.

## 2018-09-26 ENCOUNTER — Telehealth: Payer: Self-pay

## 2018-09-26 NOTE — Telephone Encounter (Signed)
LVM FOR PT TO RETURN CALL TO OFFICE. PT NEEDS APPT CHANGE TO VIRTUAL AT 130

## 2018-10-01 ENCOUNTER — Telehealth: Payer: Self-pay

## 2018-10-01 ENCOUNTER — Ambulatory Visit: Payer: Commercial Managed Care - PPO | Admitting: Cardiovascular Disease

## 2018-10-01 NOTE — Telephone Encounter (Signed)
Spoke with patient to change office visit to virtual visit due to covid19. Patient states she wants to cancel her appointment and she will call when she is ready for another appointment.

## 2018-10-09 DIAGNOSIS — F3176 Bipolar disorder, in full remission, most recent episode depressed: Secondary | ICD-10-CM | POA: Diagnosis not present

## 2018-12-05 ENCOUNTER — Other Ambulatory Visit: Payer: Self-pay | Admitting: Cardiovascular Disease

## 2018-12-10 ENCOUNTER — Other Ambulatory Visit: Payer: Self-pay | Admitting: Family Medicine

## 2018-12-10 DIAGNOSIS — E119 Type 2 diabetes mellitus without complications: Secondary | ICD-10-CM

## 2018-12-11 NOTE — Telephone Encounter (Signed)
Has an appt in september

## 2019-01-05 ENCOUNTER — Other Ambulatory Visit: Payer: Self-pay | Admitting: Family Medicine

## 2019-01-06 NOTE — Telephone Encounter (Signed)
Pt has an appt 9/21

## 2019-02-09 NOTE — Progress Notes (Deleted)
Subjective:    Patient ID: Carolyn Salazar, female    DOB: 06/18/1970, 48 y.o.   MRN: [id number]  HPI No chief complaint on file.  She is here for a complete physical exam and follow up on diabetes and other chronic health conditions. Previous medical care: Last CPE:  Other providers: Cardiologist- Dr. Oval Linsey   Past medical history: Surgeries:  Family history: Mental Health History:  Social history: Lives with ***, works as ***, *** Smoking, drinking alcohol, drug use  Diet: *** Excerise: ***  Immunizations:  Health maintenance:  Mammogram: Colonoscopy: Last Gynecological Exam: Last Menstrual cycle: Pregnancies:  Last Dental Exam: Last Eye Exam:  Wears seatbelt always, uses sunscreen, smoke detectors in home and functioning, does not text while driving and feels safe in home environment.   Reviewed allergies, medications, past medical, surgical, family, and social history.   Review of Systems Review of Systems Constitutional: -fever, -chills, -sweats, -unexpected weight change,-fatigue ENT: -runny nose, -ear pain, -sore throat Cardiology:  -chest pain, -palpitations, -edema Respiratory: -cough, -shortness of breath, -wheezing Gastroenterology: -abdominal pain, -nausea, -vomiting, -diarrhea, -constipation  Hematology: -bleeding or bruising problems Musculoskeletal: -arthralgias, -myalgias, -joint swelling, -back pain Ophthalmology: -vision changes Urology: -dysuria, -difficulty urinating, -hematuria, -urinary frequency, -urgency Neurology: -headache, -weakness, -tingling, -numbness       Objective:   Physical Exam There were no vitals taken for this visit.  General Appearance:    Alert, cooperative, no distress, appears stated age  Head:    Normocephalic, without obvious abnormality, atraumatic  Eyes:    PERRL, conjunctiva/corneas clear, EOM's intact, fundi    benign  Ears:    Normal TM's and external ear canals  Nose:   Nares normal, mucosa  normal, no drainage or sinus   tenderness  Throat:   Lips, mucosa, and tongue normal; teeth and gums normal  Neck:   Supple, no lymphadenopathy;  thyroid:  no   enlargement/tenderness/nodules; no carotid   bruit or JVD  Back:    Spine nontender, no curvature, ROM normal, no CVA     tenderness  Lungs:     Clear to auscultation bilaterally without wheezes, rales or     ronchi; respirations unlabored  Chest Wall:    No tenderness or deformity   Heart:    Regular rate and rhythm, S1 and S2 normal, no murmur, rub   or gallop  Breast Exam:    No tenderness, masses, or nipple discharge or inversion.      No axillary lymphadenopathy  Abdomen:     Soft, non-tender, nondistended, normoactive bowel sounds,    no masses, no hepatosplenomegaly  Genitalia:    Normal external genitalia without lesions.  BUS and vagina normal; cervix without lesions, or cervical motion tenderness. No abnormal vaginal discharge.  Uterus and adnexa not enlarged, nontender, no masses.  Pap performed  Rectal:    Normal tone, no masses or tenderness; guaiac negative stool  Extremities:   No clubbing, cyanosis or edema  Pulses:   2+ and symmetric all extremities  Skin:   Skin color, texture, turgor normal, no rashes or lesions  Lymph nodes:   Cervical, supraclavicular, and axillary nodes normal  Neurologic:   CNII-XII intact, normal strength, sensation and gait; reflexes 2+ and symmetric throughout          Psych:   Normal mood, affect, hygiene and grooming.          Assessment & Plan:  Routine general medical examination at a health care facility  Controlled type 2  diabetes mellitus with complication, without long-term current use of insulin ([place])

## 2019-02-10 ENCOUNTER — Telehealth: Payer: Self-pay | Admitting: Internal Medicine

## 2019-02-10 ENCOUNTER — Encounter: Payer: Commercial Managed Care - PPO | Admitting: Family Medicine

## 2019-02-10 DIAGNOSIS — Z0000 Encounter for general adult medical examination without abnormal findings: Secondary | ICD-10-CM

## 2019-02-10 NOTE — Telephone Encounter (Signed)
E. No show CPE

## 2019-02-10 NOTE — Telephone Encounter (Signed)

## 2019-02-11 ENCOUNTER — Encounter: Payer: Self-pay | Admitting: Family Medicine

## 2019-02-11 NOTE — Telephone Encounter (Signed)
Sent letter will you add fee to pts account please

## 2019-02-11 NOTE — Telephone Encounter (Signed)
Forwarding to [place]

## 2019-03-04 LAB — HM DIABETES EYE EXAM

## 2019-03-07 ENCOUNTER — Encounter: Payer: Self-pay | Admitting: Internal Medicine

## 2019-03-11 ENCOUNTER — Other Ambulatory Visit: Payer: Self-pay | Admitting: Family Medicine

## 2019-03-11 DIAGNOSIS — E119 Type 2 diabetes mellitus without complications: Secondary | ICD-10-CM

## 2019-03-12 NOTE — Telephone Encounter (Signed)
Pt has an appt next week for cpe

## 2019-03-20 ENCOUNTER — Other Ambulatory Visit: Payer: Self-pay

## 2019-03-20 ENCOUNTER — Ambulatory Visit (INDEPENDENT_AMBULATORY_CARE_PROVIDER_SITE_OTHER): Payer: Commercial Managed Care - PPO | Admitting: Family Medicine

## 2019-03-20 ENCOUNTER — Encounter: Payer: Self-pay | Admitting: Family Medicine

## 2019-03-20 VITALS — BP 110/70 | HR 94 | Temp 98.0°F | Ht 66.5 in | Wt 237.8 lb

## 2019-03-20 DIAGNOSIS — Z23 Encounter for immunization: Secondary | ICD-10-CM

## 2019-03-20 DIAGNOSIS — E782 Mixed hyperlipidemia: Secondary | ICD-10-CM

## 2019-03-20 DIAGNOSIS — Z6837 Body mass index (BMI) 37.0-37.9, adult: Secondary | ICD-10-CM

## 2019-03-20 DIAGNOSIS — Z975 Presence of (intrauterine) contraceptive device: Secondary | ICD-10-CM

## 2019-03-20 DIAGNOSIS — Z0000 Encounter for general adult medical examination without abnormal findings: Secondary | ICD-10-CM

## 2019-03-20 DIAGNOSIS — E559 Vitamin D deficiency, unspecified: Secondary | ICD-10-CM | POA: Diagnosis not present

## 2019-03-20 DIAGNOSIS — E118 Type 2 diabetes mellitus with unspecified complications: Secondary | ICD-10-CM | POA: Diagnosis not present

## 2019-03-20 LAB — POCT GLYCOSYLATED HEMOGLOBIN (HGB A1C): Hemoglobin A1C: 5.7 % — AB (ref 4.0–5.6)

## 2019-03-20 NOTE — Progress Notes (Signed)
Subjective:    Patient ID: Carolyn Salazar, female    DOB: April 03, 1971, 48 y.o.   MRN: [id number]  HPI Chief Complaint  Patient presents with  . physical    physcial. no other concerns. flu shot given today. has obgyn   She is here for a complete physical exam and follow-up on chronic health conditions. Last CPE: Years ago  Other providers: Noemi [facility]- psychiatrist Dr. Oval Linsey- cardiologist  Dr. Silverio Decamp- GI   She was seeing Dr. Toney Rakes ,OB/GYN, in the past and has not scheduled with a new gynecologist but plans to do so. Mirena IUD is overdue for removal.  Periods started 6-8 months ago. Vaginal bleeding every 3-4 weeks. Previously no periods after getting IUD    Diabetes -reports good daily compliance with metformin 1,000 mg bid, Jardiance 10 mg and no side effects. She does not check her blood sugars at home but she did check it today FBS today 152  Up-to-date with diabetic eye exam. Denies polyuria polydipsia  Reports taking lisinopril 2.5 mg daily.  She is on a statin and denies any concerns.  States she takes it daily.  Vitamin D def- taking supplement but is not sure what dose.  States her mood is good.  Social history: Lives with her husband and her daughter is 2 years old, is disabled.  Denies smoking, drug use.  Rarely drinks alcohol.  She does report having 3 drinks last week which is unusual for her. Diet: fairly healthy  Excerise: none  Immunizations: Overdue for Tdap, pneumonia vaccine.  Would like flu shot today also.  Health maintenance:  Mammogram: 2015  Colonoscopy: never  Last Gynecological Exam: 5 years ago  Last Menstrual cycle: last week  Last Dental Exam: years ago  Last Eye Exam: last week   Wears seatbelt always, uses sunscreen, smoke detectors in home and functioning, does not text while driving and feels safe in home environment.   Reviewed allergies, medications, past medical, surgical, family, and social history.   Review  of Systems Review of Systems Constitutional: -fever, -chills, -sweats, -unexpected weight change,-fatigue ENT: -runny nose, -ear pain, -sore throat Cardiology:  -chest pain, -palpitations, -edema Respiratory: -cough, -shortness of breath, -wheezing Gastroenterology: -abdominal pain, -nausea, -vomiting, -diarrhea, -constipation  Hematology: -bleeding or bruising problems Musculoskeletal: -arthralgias, -myalgias, -joint swelling, -back pain Ophthalmology: -vision changes Urology: -dysuria, -difficulty urinating, -hematuria, -urinary frequency, -urgency Neurology: -headache, -weakness, -tingling, -numbness       Objective:   Physical Exam BP 110/70   Pulse 94   Temp 98 F (36.7 C)   Ht 5' 6.5" (1.689 m)   Wt 237 lb 12.8 oz (107.9 kg)   BMI 37.81 kg/m   General Appearance:    Alert, cooperative, no distress, appears stated age  Head:    Normocephalic, without obvious abnormality, atraumatic  Eyes:    PERRL, conjunctiva/corneas clear, EOM's intact, fundi    benign  Ears:    Normal TM's and external ear canals  Nose:  Mask in place  Throat:  Mask in place  Neck:   Supple, no lymphadenopathy;  thyroid:  no   enlargement/tenderness/nodules  Back:    Spine nontender, no curvature, ROM normal, no CVA     tenderness  Lungs:     Clear to auscultation bilaterally without wheezes, rales or     ronchi; respirations unlabored  Chest Wall:    No tenderness or deformity   Heart:    Regular rate and rhythm, S1 and S2 normal, no murmur, rub  or gallop  Breast Exam:   Declines.  Mammogram ordered  Abdomen:     Soft, non-tender, nondistended, normoactive bowel sounds,    no masses, no hepatosplenomegaly  Genitalia:   Declines.  Plans to schedule with her OB/GYN     Extremities:   No clubbing, cyanosis or edema  Pulses:   2+ and symmetric all extremities  Skin:   Skin color, texture, turgor normal, no rashes or lesions  Lymph nodes:   Cervical, supraclavicular, and axillary nodes normal   Neurologic:   CNII-XII intact, normal strength, sensation and gait; reflexes 2+ and symmetric throughout          Psych:   Normal mood, affect, hygiene and grooming.        Assessment & Plan:  Routine general medical examination at a health care facility - Plan: CBC with Differential/Platelet, Comprehensive metabolic panel, TSH, Lipid panel, T4, free -Here today for a fasting CPE.  Preventive healthcare reviewed and she is overdue in multiple areas.  Mammogram ordered and she will call the breast center to schedule this.  Overdue for Pap smear and she plans to see an OB/GYN for this.  Immunizations reviewed and updated.  Discussed safety.  Counseling on healthy diet and exercise and overall lifestyle to prevent worsening health. Continue seeing psychiatrist.  She is in good spirits. Recommend that she follow-up with her cardiologist since she is overdue per patient as a result of COVID-19.  Controlled type 2 diabetes mellitus with complication, without long-term current use of insulin ([place]) - Plan: HgB A1c, Microalbumin / creatinine urine ratio -Hemoglobin A1c today is 5.7%.  We discussed reducing her current medication dosages but she is not in favor of this.  States her fasting blood sugar today was in the 150s.  She will continue for now on Metformin and Jardiance.  She is on an ACE inhibitor and a statin.  Diabetic eye exam is up-to-date.  Exam done today.  Urine microalbumin was ordered today.  Needs flu shot - Plan: Flu Vaccine QUAD 36+ mos IM  IUD (intrauterine device) in place - Plan: Ambulatory referral to Gynecology -Discussed that her IUD is overdue for removal by several years.  She will schedule with OB/GYN.  Vitamin D deficiency - Plan: VITAMIN D 25 Hydroxy (Vit-D Deficiency, Fractures) -She is currently on a supplement and we will adjust dose as appropriate pending vitamin D level  Class 2 severe obesity with serious comorbidity and body mass index (BMI) of 37.0 to 37.9 in  adult, unspecified obesity type ([place]) -Counseling on healthy diet and exercise for weight loss.  Mixed hyperlipidemia - Plan: Lipid panel -She will continue on statin therapy.  Check fasting lipid panel and she will schedule with her cardiologist.  Need for prophylactic vaccination against Streptococcus pneumoniae (pneumococcus) - Plan: Pneumococcal polysaccharide vaccine 23-valent greater than or equal to 2yo subcutaneous/IM -Immunization counseling done  Need for diphtheria-tetanus-pertussis (Tdap) vaccine - Plan: Tdap vaccine greater than or equal to 7yo IM -Counseling on all components of the vaccine

## 2019-03-20 NOTE — Patient Instructions (Signed)
Call and [facility].   Call and schedule a follow up with Dr. Oval Linsey.   You should receive a call from the OB/GYN office.   We will be in touch with your results.     Preventive Care 47-47 Years Old, Female Preventive care refers to visits with your health care provider and lifestyle choices that can promote health and wellness. This includes:  A yearly physical exam. This may also be called an annual well check.  Regular dental visits and eye exams.  Immunizations.  Screening for certain conditions.  Healthy lifestyle choices, such as eating a healthy diet, getting regular exercise, not using drugs or products that contain nicotine and tobacco, and limiting alcohol use. What can I expect for my preventive care visit? Physical exam Your health care provider will check your:  Height and weight. This may be used to calculate body mass index (BMI), which tells if you are at a healthy weight.  Heart rate and blood pressure.  Skin for abnormal spots. Counseling Your health care provider may ask you questions about your:  Alcohol, tobacco, and drug use.  Emotional well-being.  Home and relationship well-being.  Sexual activity.  Eating habits.  Work and work Statistician.  Method of birth control.  Menstrual cycle.  Pregnancy history. What immunizations do I need?  Influenza (flu) vaccine  This is recommended every year. Tetanus, diphtheria, and pertussis (Tdap) vaccine  You may need a Td booster every 10 years. Varicella (chickenpox) vaccine  You may need this if you have not been vaccinated. Zoster (shingles) vaccine  You may need this after age 22. Measles, mumps, and rubella (MMR) vaccine  You may need at least one dose of MMR if you were born in 1957 or later. You may also need a second dose. Pneumococcal conjugate (PCV13) vaccine  You may need this if you have certain conditions and were not previously  vaccinated. Pneumococcal polysaccharide (PPSV23) vaccine  You may need one or two doses if you smoke cigarettes or if you have certain conditions. Meningococcal conjugate (MenACWY) vaccine  You may need this if you have certain conditions. Hepatitis A vaccine  You may need this if you have certain conditions or if you travel or work in places where you may be exposed to hepatitis A. Hepatitis B vaccine  You may need this if you have certain conditions or if you travel or work in places where you may be exposed to hepatitis B. Haemophilus influenzae type b (Hib) vaccine  You may need this if you have certain conditions. Human papillomavirus (HPV) vaccine  If recommended by your health care provider, you may need three doses over 6 months. You may receive vaccines as individual doses or as more than one vaccine together in one shot (combination vaccines). Talk with your health care provider about the risks and benefits of combination vaccines. What tests do I need? Blood tests  Lipid and cholesterol levels. These may be checked every 5 years, or more frequently if you are over 43 years old.  Hepatitis C test.  Hepatitis B test. Screening  Lung cancer screening. You may have this screening every year starting at age 53 if you have a 30-pack-year history of smoking and currently smoke or have quit within the past 15 years.  Colorectal cancer screening. All adults should have this screening starting at age 66 and continuing until age 56. Your health care provider may recommend screening at age 91 if you are  at increased risk. You will have tests every 1-10 years, depending on your results and the type of screening test.  Diabetes screening. This is done by checking your blood sugar (glucose) after you have not eaten for a while (fasting). You may have this done every 1-3 years.  Mammogram. This may be done every 1-2 years. Talk with your health care provider about when you should start  having regular mammograms. This may depend on whether you have a family history of breast cancer.  BRCA-related cancer screening. This may be done if you have a family history of breast, ovarian, tubal, or peritoneal cancers.  Pelvic exam and Pap test. This may be done every 3 years starting at age 53. Starting at age 47, this may be done every 5 years if you have a Pap test in combination with an HPV test. Other tests  Sexually transmitted disease (STD) testing.  Bone density scan. This is done to screen for osteoporosis. You may have this scan if you are at high risk for osteoporosis. Follow these instructions at home: Eating and drinking  Eat a diet that includes fresh fruits and vegetables, whole grains, lean protein, and low-fat dairy.  Take vitamin and mineral supplements as recommended by your health care provider.  Do not drink alcohol if: ? Your health care provider tells you not to drink. ? You are pregnant, may be pregnant, or are planning to become pregnant.  If you drink alcohol: ? Limit how much you have to 0-1 drink a day. ? Be aware of how much alcohol is in your drink. In the [place] one drink equals one 12 oz bottle of beer (355 mL), one 5 oz glass of wine (148 mL), or one 1 oz glass of hard liquor (44 mL). Lifestyle  Take daily care of your teeth and gums.  Stay active. Exercise for at least 30 minutes on 5 or more days each week.  Do not use any products that contain nicotine or tobacco, such as cigarettes, e-cigarettes, and chewing tobacco. If you need help quitting, ask your health care provider.  If you are sexually active, practice safe sex. Use a condom or other form of birth control (contraception) in order to prevent pregnancy and STIs (sexually transmitted infections).  If told by your health care provider, take low-dose aspirin daily starting at age 68. What's next?  Visit your health care provider once a year for a well check visit.  Ask your health  care provider how often you should have your eyes and teeth checked.  Stay up to date on all vaccines. This information is not intended to replace advice given to you by your health care provider. Make sure you discuss any questions you have with your health care provider. Document Released: 06/04/2015 Document Revised: 01/17/2018 Document Reviewed: 01/17/2018 Elsevier Patient Education  2020 [employer].

## 2019-03-21 LAB — CBC WITH DIFFERENTIAL/PLATELET
Basophils Absolute: 0.1 10*3/uL (ref 0.0–0.2)
Basos: 1 %
EOS (ABSOLUTE): 0.4 10*3/uL (ref 0.0–0.4)
Eos: 5 %
Hematocrit: 43.4 % (ref 34.0–46.6)
Hemoglobin: 14.5 g/dL (ref 11.1–15.9)
Immature Grans (Abs): 0 10*3/uL (ref 0.0–0.1)
Immature Granulocytes: 0 %
Lymphocytes Absolute: 2.5 10*3/uL (ref 0.7–3.1)
Lymphs: 28 %
MCH: 28.9 pg (ref 26.6–33.0)
MCHC: 33.4 g/dL (ref 31.5–35.7)
MCV: 87 fL (ref 79–97)
Monocytes Absolute: 0.7 10*3/uL (ref 0.1–0.9)
Monocytes: 8 %
Neutrophils Absolute: 5.1 10*3/uL (ref 1.4–7.0)
Neutrophils: 58 %
Platelets: 341 10*3/uL (ref 150–450)
RBC: 5.01 x10E6/uL (ref 3.77–5.28)
RDW: 13.5 % (ref 11.7–15.4)
WBC: 8.8 10*3/uL (ref 3.4–10.8)

## 2019-03-21 LAB — TSH: TSH: 3.1 u[IU]/mL (ref 0.450–4.500)

## 2019-03-21 LAB — COMPREHENSIVE METABOLIC PANEL
ALT: 32 IU/L (ref 0–32)
AST: 24 IU/L (ref 0–40)
Albumin/Globulin Ratio: 1.7 (ref 1.2–2.2)
Albumin: 4.1 g/dL (ref 3.8–4.8)
Alkaline Phosphatase: 118 IU/L — ABNORMAL HIGH (ref 39–117)
BUN/Creatinine Ratio: 20 (ref 9–23)
BUN: 13 mg/dL (ref 6–24)
Bilirubin Total: 0.6 mg/dL (ref 0.0–1.2)
CO2: 24 mmol/L (ref 20–29)
Calcium: 9.3 mg/dL (ref 8.7–10.2)
Chloride: 102 mmol/L (ref 96–106)
Creatinine, Ser: 0.66 mg/dL (ref 0.57–1.00)
GFR calc Af Amer: 122 mL/min/{1.73_m2} (ref >59)
GFR calc non Af Amer: 106 mL/min/{1.73_m2} (ref >59)
Globulin, Total: 2.4 g/dL (ref 1.5–4.5)
Glucose: 126 mg/dL — ABNORMAL HIGH (ref 65–99)
Potassium: 3.8 mmol/L (ref 3.5–5.2)
Sodium: 140 mmol/L (ref 134–144)
Total Protein: 6.5 g/dL (ref 6.0–8.5)

## 2019-03-21 LAB — LIPID PANEL
Chol/HDL Ratio: 3.2 ratio (ref 0.0–4.4)
Cholesterol, Total: 145 mg/dL (ref 100–199)
HDL: 45 mg/dL (ref >39)
LDL Chol Calc (NIH): 66 mg/dL (ref 0–99)
Triglycerides: 207 mg/dL — ABNORMAL HIGH (ref 0–149)
VLDL Cholesterol Cal: 34 mg/dL (ref 5–40)

## 2019-03-21 LAB — T4, FREE: Free T4: 1.25 ng/dL (ref 0.82–1.77)

## 2019-03-21 LAB — MICROALBUMIN / CREATININE URINE RATIO
Creatinine, Urine: 32.7 mg/dL
Microalb/Creat Ratio: <9 mg/g creat (ref 0–29)
Microalbumin, Urine: <3 ug/mL

## 2019-03-21 LAB — VITAMIN D 25 HYDROXY (VIT D DEFICIENCY, FRACTURES): Vit D, 25-Hydroxy: 20.8 ng/mL — ABNORMAL LOW (ref 30.0–100.0)

## 2019-04-07 DIAGNOSIS — F3176 Bipolar disorder, in full remission, most recent episode depressed: Secondary | ICD-10-CM | POA: Diagnosis not present

## 2019-04-16 ENCOUNTER — Other Ambulatory Visit: Payer: Self-pay | Admitting: Gastroenterology

## 2019-04-16 DIAGNOSIS — R748 Abnormal levels of other serum enzymes: Secondary | ICD-10-CM

## 2019-04-23 ENCOUNTER — Telehealth: Payer: Self-pay

## 2019-04-23 ENCOUNTER — Encounter: Payer: Self-pay | Admitting: Family Medicine

## 2019-04-23 NOTE — Telephone Encounter (Signed)
Pt. Has been scheduled for tomorrow for a virtual apt to discuss headaches.

## 2019-04-24 ENCOUNTER — Other Ambulatory Visit: Payer: Self-pay

## 2019-04-24 ENCOUNTER — Ambulatory Visit (INDEPENDENT_AMBULATORY_CARE_PROVIDER_SITE_OTHER): Payer: Commercial Managed Care - PPO | Admitting: Family Medicine

## 2019-04-24 ENCOUNTER — Encounter: Payer: Self-pay | Admitting: Family Medicine

## 2019-04-24 VITALS — Temp 97.4°F | Wt 235.0 lb

## 2019-04-24 DIAGNOSIS — G43011 Migraine without aura, intractable, with status migrainosus: Secondary | ICD-10-CM | POA: Diagnosis not present

## 2019-04-24 MED ORDER — SUMATRIPTAN SUCCINATE 100 MG PO TABS: 100.0000 mg | ORAL_TABLET | ORAL | 0 refills | Status: DC | PRN

## 2019-04-24 NOTE — Progress Notes (Signed)
   Subjective:  Documentation for virtual audio and video telecommunications through [facility] encounter:  The patient was located at home. 2 patient identifiers used.  The provider was located in the office. The patient did consent to this visit and is aware of possible charges through their insurance for this visit.  The other persons participating in this telemedicine service were none.    Patient ID: Carolyn Salazar, female    DOB: Jul 20, 1970, 48 y.o.   MRN: [id number]  HPI Chief Complaint  Patient presents with  . headaches    headache x1 week. take otc excedrin or ibuprofen. left side   Complains of a left frontal headache x one week. Headache is throbbing mostly.  States the headache occasionally moves to her right side as well.  States this feels like one of her usual migraine headaches but is lasting longer than usual.  States Excedrin migraine usually resolves her headache but it has not this time.   She has taken triptans in the past but not in several years.   No photophobia, phonophobia. No numbness, tingling or weakness.   Denies fever, chills, dizziness, vision changes, tinnitus, sinus pain, URI symptoms, chest pain, palpitations, shortness of breath, abdominal pain, V/D, urinary symptoms, LE edema.   Reviewed allergies, medications, past medical, surgical, family, and social history.   Review of Systems Pertinent positives and negatives in the history of present illness.     Objective:   Physical Exam Temp (!) 97.4 F (36.3 C)   Wt 235 lb (106.6 kg)   BMI 37.36 kg/m   Alert and oriented and in no acute distress. No facial asymmetry. Moving all extremities without difficulty. Normal speech and thought process.       Assessment & Plan:  Intractable migraine without aura and with status migrainosus  Discussed limitations of a virtual visit.  She is not in any acute distress. No neurological deficits per patient. Concerning that her headache has been  present for one week without breaking but she reports this is her usual type of headache and not the worst headache of her life.  Will prescribe a triptan for her and she will take 2 Aleve twice daily for the next 2 days. She will follow up tomorrow if her headache does not break. Consider steroids at that time. If she gets much worse, she is aware that she will need to go to the ED for further evaluation and treatment.   Time spent on call was 15 minutes and in review of previous records 15 minutes total.  This virtual service is not related to other E/M service within previous 7 days.

## 2019-04-25 ENCOUNTER — Other Ambulatory Visit: Payer: Self-pay

## 2019-04-26 ENCOUNTER — Other Ambulatory Visit: Payer: Self-pay | Admitting: Family Medicine

## 2019-04-28 ENCOUNTER — Encounter: Payer: Self-pay | Admitting: Obstetrics & Gynecology

## 2019-04-28 ENCOUNTER — Other Ambulatory Visit: Payer: Self-pay

## 2019-04-28 ENCOUNTER — Ambulatory Visit (INDEPENDENT_AMBULATORY_CARE_PROVIDER_SITE_OTHER): Payer: Commercial Managed Care - PPO | Admitting: Obstetrics & Gynecology

## 2019-04-28 VITALS — BP 124/78 | Ht 66.0 in | Wt 235.0 lb

## 2019-04-28 DIAGNOSIS — N920 Excessive and frequent menstruation with regular cycle: Secondary | ICD-10-CM | POA: Diagnosis not present

## 2019-04-28 DIAGNOSIS — Z9851 Tubal ligation status: Secondary | ICD-10-CM

## 2019-04-28 DIAGNOSIS — Z30432 Encounter for removal of intrauterine contraceptive device: Secondary | ICD-10-CM

## 2019-04-28 DIAGNOSIS — L292 Pruritus vulvae: Secondary | ICD-10-CM | POA: Diagnosis not present

## 2019-04-28 DIAGNOSIS — Z01419 Encounter for gynecological examination (general) (routine) without abnormal findings: Secondary | ICD-10-CM

## 2019-04-28 LAB — WET PREP FOR TRICH, YEAST, CLUE

## 2019-04-28 MED ORDER — FLUCONAZOLE 150 MG PO TABS: 150.0000 mg | ORAL_TABLET | Freq: Every day | ORAL | 3 refills | Status: AC

## 2019-04-28 NOTE — Patient Instructions (Signed)
1. Encounter for routine gynecological examination with Papanicolaou smear of cervix Normal gynecologic exam.  Pap reflex done.  Breast exam normal.  Will schedule a screening mammogram now.  Health labs with family nurse practitioner.  2. S/P tubal ligation  3. Menorrhagia with regular cycle Mirena IUD for control of menorrhagia.  Mirena IUD removed today.  F/U Mirena IUD insertion.  4. Encounter for IUD removal Was overdue for Mirena IUD change after 7 years post insertion.  Easy removal of Mirena IUD today.  Mirena IUD intact and complete.  Well-tolerated and no complication.  F/U Mirena IUD insertion.  5. Vulvar itching Yeast vulvo-vaginitis.  Will treat with Fluconazole.  Usage reviewed.  Prescription sent to pharmacy.  Recommend applying hydrocortisone 1% OTC at inflamed vulva as needed. - WET PREP FOR [place], YEAST, CLUE  Other orders - fluconazole (DIFLUCAN) 150 MG tablet; Take 1 tablet (150 mg total) by mouth daily for 3 days.  Carolyn Salazar, it was a pleasure meeting you today!  I will inform you of your results as soon as they are available.

## 2019-04-28 NOTE — Progress Notes (Signed)
Carolyn Salazar 1970-06-14 [id number]   History:    48 y.o. G1P1L1 Married. S/P TL.  Has grand-children.  RP:  New (>3 yrs) patient presenting for annual gyn exam   HPI: Mirena IUD since 2013 for control of menorrhagia.  Patient is status post bilateral tubal ligation.  Menstrual flow is really increasing currently.  No pelvic pain.  Abstinent.  Urine and bowel movements normal.  Breasts normal.  Body mass index 37.93.  Patient is sedentary.  Health labs with family NP.    Past medical history,surgical history, family history and social history were all reviewed and documented in the EPIC chart.  Gynecologic History No LMP recorded. (Menstrual status: IUD). Contraception: tubal ligation Last Pap: 08/2014. Results were: Negative/HPV HR neg Last mammogram: 07/2013. Results were: Negative Bone Density: Never Colonoscopy: Never  Obstetric History OB History  Gravida Para Term Preterm AB Living  1 1 1     1   SAB TAB Ectopic Multiple Live Births          1    # Outcome Date GA Lbr Len/2nd Weight Sex Delivery Anes PTL Lv  1 Term     F Vag-Spont  N LIV     ROS: A ROS was performed and pertinent positives and negatives are included in the history.  GENERAL: No fevers or chills. HEENT: No change in vision, no earache, sore throat or sinus congestion. NECK: No pain or stiffness. CARDIOVASCULAR: No chest pain or pressure. No palpitations. PULMONARY: No shortness of breath, cough or wheeze. GASTROINTESTINAL: No abdominal pain, nausea, vomiting or diarrhea, melena or bright red blood per rectum. GENITOURINARY: No urinary frequency, urgency, hesitancy or dysuria. MUSCULOSKELETAL: No joint or muscle pain, no back pain, no recent trauma. DERMATOLOGIC: No rash, no itching, no lesions. ENDOCRINE: No polyuria, polydipsia, no heat or cold intolerance. No recent change in weight. HEMATOLOGICAL: No anemia or easy bruising or bleeding. NEUROLOGIC: No headache, seizures, numbness, tingling or weakness.  PSYCHIATRIC: No depression, no loss of interest in normal activity or change in sleep pattern.     Exam:   BP 124/78   Ht 5' 6"  (1.676 m)   Wt 235 lb (106.6 kg)   BMI 37.93 kg/m   Body mass index is 37.93 kg/m.  General appearance : Well developed well nourished female. No acute distress HEENT: Eyes: no retinal hemorrhage or exudates,  Neck supple, trachea midline, no carotid bruits, no thyroidmegaly Lungs: Clear to auscultation, no rhonchi or wheezes, or rib retractions  Heart: Regular rate and rhythm, no murmurs or gallops Breast:Examined in sitting and supine position were symmetrical in appearance, no palpable masses or tenderness,  no skin retraction, no nipple inversion, no nipple discharge, no skin discoloration, no axillary or supraclavicular lymphadenopathy Abdomen: no palpable masses or tenderness, no rebound or guarding Extremities: no edema or skin discoloration or tenderness  Pelvic: Vulva: Erythema bilaterally.             Vagina: No gross lesions or discharge.  Wet prep done.  Cervix: No gross lesions or discharge.  IUD strings visible at [facility].  Strings grasped with a fenestrated clamp and IUD removed easily by pulling.  IUD intact, complete, shown to patient and discarded.  Pap reflex done.  Uterus  AV, normal size, shape and consistency, non-tender and mobile  Adnexa  Without masses or tenderness  Anus: Normal  Wet prep:  Yeasts present   Assessment/Plan:  48 y.o. female for annual exam   1. Encounter for routine gynecological  examination with Papanicolaou smear of cervix Normal gynecologic exam.  Pap reflex done.  Breast exam normal.  Will schedule a screening mammogram now.  Health labs with family nurse practitioner.  2. S/P tubal ligation  3. Menorrhagia with regular cycle Mirena IUD for control of menorrhagia.  Mirena IUD removed today.  F/U Mirena IUD insertion.  4. Encounter for IUD removal Was overdue for Mirena IUD change after 7 years post  insertion.  Easy removal of Mirena IUD today.  Mirena IUD intact and complete.  Well-tolerated and no complication.  F/U Mirena IUD insertion.  5. Vulvar itching Yeast vulvo-vaginitis.  Will treat with Fluconazole.  Usage reviewed.  Prescription sent to pharmacy.  Recommend applying hydrocortisone 1% OTC at inflamed vulva as needed. - WET PREP FOR [place], YEAST, CLUE  Other orders - fluconazole (DIFLUCAN) 150 MG tablet; Take 1 tablet (150 mg total) by mouth daily for 3 days.  Counseling on above issues and coordination of care more than 50% for 10 minutes.  Princess Bruins MD, 4:02 PM 04/28/2019

## 2019-05-02 LAB — PAP IG W/ RFLX HPV ASCU

## 2019-05-02 LAB — HUMAN PAPILLOMAVIRUS, HIGH RISK: HPV DNA High Risk: NOT DETECTED

## 2019-05-20 ENCOUNTER — Other Ambulatory Visit: Payer: Self-pay | Admitting: Cardiovascular Disease

## 2019-05-21 NOTE — Telephone Encounter (Signed)
Rx(s) sent to pharmacy electronically.  

## 2019-05-30 ENCOUNTER — Other Ambulatory Visit: Payer: Self-pay

## 2019-06-02 ENCOUNTER — Ambulatory Visit (INDEPENDENT_AMBULATORY_CARE_PROVIDER_SITE_OTHER): Payer: Managed Care, Other (non HMO) | Admitting: Obstetrics & Gynecology

## 2019-06-02 ENCOUNTER — Encounter: Payer: Self-pay | Admitting: Obstetrics & Gynecology

## 2019-06-02 ENCOUNTER — Other Ambulatory Visit: Payer: Self-pay

## 2019-06-02 ENCOUNTER — Other Ambulatory Visit: Payer: Self-pay | Admitting: Cardiovascular Disease

## 2019-06-02 VITALS — BP 134/84

## 2019-06-02 DIAGNOSIS — Z30430 Encounter for insertion of intrauterine contraceptive device: Secondary | ICD-10-CM | POA: Diagnosis not present

## 2019-06-02 DIAGNOSIS — N762 Acute vulvitis: Secondary | ICD-10-CM | POA: Diagnosis not present

## 2019-06-02 MED ORDER — TERCONAZOLE 0.8 % VA CREA: 1.0000 | TOPICAL_CREAM | Freq: Every day | VAGINAL | 1 refills | Status: AC

## 2019-06-02 NOTE — Progress Notes (Signed)
    Carolyn Salazar 1970/09/06 [id number]        49 y.o.  G1P1L1 Married.  S/P TL.  Has Grand-Children.  RP: Mirena IUD Insertion  HPI: Last Pap test 04/29/2019 ASCUS/HPV HR Negative.  Mirena IUD overdue, removed on 04/29/2019.  Using Mirena IUD for cycle control.  Treated for Yeast Vaginitis with Fluconazole x 2.  Vaginal secretions now normal, but vulvar redness with  irritation ++.     OB History  Gravida Para Term Preterm AB Living  1 1 1     1   SAB TAB Ectopic Multiple Live Births          1    # Outcome Date GA Lbr Len/2nd Weight Sex Delivery Anes PTL Lv  1 Term     F Vag-Spont  N LIV    Past medical history,surgical history, problem list, medications, allergies, family history and social history were all reviewed and documented in the EPIC chart.   Directed ROS with pertinent positives and negatives documented in the history of present illness/assessment and plan.  Exam:  Vitals:   06/02/19 1432  BP: 134/84   General appearance:  Normal                                                                    IUD procedure note       Patient presented to the office today for placement of Mirena IUD. The patient had previously been provided with literature information on this method of contraception. The risks benefits and pros and cons were discussed and all her questions were answered. She is fully aware that this form of contraception is 99% effective and is good for 5 years.  Pelvic exam: Vulva Erythema throughout Vagina: No lesions or discharge Cervix: No lesions or discharge Uterus: AV position Adnexa: No masses or tenderness Rectal exam: Not done  The cervix was cleansed with Betadine solution. Hurricane spray on the cervix.  A single-tooth tenaculum was placed on the anterior cervical lip. The IUD was shown to the patient and inserted in a sterile fashion.  Hysterometry with the IUD as being inserted was 7 cm.  The IUD string was trimmed. The single-tooth tenaculum  was removed. Patient was instructed to return back to the office in one month for follow up.        Assessment/Plan:  49 y.o. G1P1001   1. Encounter for IUD insertion Easy insertion of Mirena IUD.  No complication.  Well-tolerated by patient.  Postprocedure precautions reviewed.  Follow-up in 4 weeks for IUD check.  2. Acute vulvitis Probable persistence of yeast vulvovaginitis.  Decision to treat with terconazole cream.  Usage reviewed and prescription sent to pharmacy.  Other orders - terconazole (TERAZOL 3) 0.8 % vaginal cream; Place 1 applicator vaginally at bedtime for 3 days.  Counseling on above issues and coordination of care more than 50% for 15 minutes.  Princess Bruins MD, 3:12 PM 06/02/2019

## 2019-06-06 ENCOUNTER — Encounter: Payer: Self-pay | Admitting: Obstetrics & Gynecology

## 2019-06-06 NOTE — Patient Instructions (Signed)
1. Encounter for IUD insertion Easy insertion of Mirena IUD.  No complication.  Well-tolerated by patient.  Postprocedure precautions reviewed.  Follow-up in 4 weeks for IUD check.  2. Acute vulvitis Probable persistence of yeast vulvovaginitis.  Decision to treat with terconazole cream.  Usage reviewed and prescription sent to pharmacy.  Other orders - terconazole (TERAZOL 3) 0.8 % vaginal cream; Place 1 applicator vaginally at bedtime for 3 days.  Carolyn Salazar, it was a pleasure seeing you today!

## 2019-06-13 ENCOUNTER — Telehealth: Payer: Self-pay

## 2019-06-13 MED ORDER — METFORMIN HCL 1000 MG PO TABS: ORAL_TABLET | ORAL | 0 refills | Status: DC

## 2019-06-13 NOTE — Telephone Encounter (Signed)
Received fax from [facility] stating the pt. Needs a refill on her Metformin last apt. Was 04/24/19

## 2019-06-13 NOTE — Telephone Encounter (Signed)
Sent med in to [facility]

## 2019-06-16 ENCOUNTER — Other Ambulatory Visit: Payer: Self-pay | Admitting: Family Medicine

## 2019-06-16 DIAGNOSIS — E119 Type 2 diabetes mellitus without complications: Secondary | ICD-10-CM

## 2019-07-08 ENCOUNTER — Ambulatory Visit (INDEPENDENT_AMBULATORY_CARE_PROVIDER_SITE_OTHER): Payer: Managed Care, Other (non HMO) | Admitting: Obstetrics & Gynecology

## 2019-07-08 ENCOUNTER — Encounter: Payer: Self-pay | Admitting: Obstetrics & Gynecology

## 2019-07-08 ENCOUNTER — Other Ambulatory Visit: Payer: Self-pay

## 2019-07-08 VITALS — BP 130/82

## 2019-07-08 DIAGNOSIS — Z30431 Encounter for routine checking of intrauterine contraceptive device: Secondary | ICD-10-CM

## 2019-07-08 NOTE — Progress Notes (Signed)
    Carolyn Salazar 1970-11-15 [id number]        49 y.o.  G1P1001   RP: Mirena IUD check 4 weeks post insertion  HPI: Had a heavy period with cramps since insertion.  No vaginal bleeding x 3-4 days.  No pelvic pain currently.  No vaginal discharge.  No IC x insertion.  No fever.     OB History  Gravida Para Term Preterm AB Living  1 1 1     1   SAB TAB Ectopic Multiple Live Births          1    # Outcome Date GA Lbr Len/2nd Weight Sex Delivery Anes PTL Lv  1 Term     F Vag-Spont  N LIV    Past medical history,surgical history, problem list, medications, allergies, family history and social history were all reviewed and documented in the EPIC chart.   Directed ROS with pertinent positives and negatives documented in the history of present illness/assessment and plan.  Exam:  Vitals:   07/08/19 1512  BP: 130/82   General appearance:  Normal  Abdomen: Normal  Gynecologic exam: Vulva normal.  Speculum:  Cervix normal, no erythema.  IUD strings visible.  Vagina normal.  No bleeding, no abnormal discharge.   Assessment/Plan:  49 y.o. G1P1001   1. Encounter for routine checking of intrauterine contraceptive device (IUD) Mirena IUD well-tolerated, in good position with no sign of infection.  Patient reassured.  Will continue on Mirena IUD.  Follow-up when due for annual gynecologic exam.  Princess Bruins MD, 3:17 PM 07/08/2019

## 2019-07-10 ENCOUNTER — Encounter: Payer: Self-pay | Admitting: Obstetrics & Gynecology

## 2019-07-10 NOTE — Patient Instructions (Signed)
1. Encounter for routine checking of intrauterine contraceptive device (IUD) Mirena IUD well-tolerated, in good position with no sign of infection.  Patient reassured.  Will continue on Mirena IUD.  Follow-up when due for annual gynecologic exam.  Carolyn Salazar, it was a pleasure seeing you today!

## 2019-07-15 ENCOUNTER — Encounter: Payer: Self-pay | Admitting: Anesthesiology

## 2019-07-22 ENCOUNTER — Other Ambulatory Visit: Payer: Self-pay

## 2019-07-28 ENCOUNTER — Telehealth: Payer: Self-pay | Admitting: Internal Medicine

## 2019-07-28 DIAGNOSIS — E119 Type 2 diabetes mellitus without complications: Secondary | ICD-10-CM

## 2019-07-28 MED ORDER — SUMATRIPTAN SUCCINATE 100 MG PO TABS: 100.0000 mg | ORAL_TABLET | ORAL | 0 refills | Status: DC | PRN

## 2019-07-28 MED ORDER — JARDIANCE 10 MG PO TABS: 10.0000 mg | ORAL_TABLET | Freq: Every day | ORAL | 0 refills | Status: DC

## 2019-07-28 NOTE — Telephone Encounter (Signed)
Pt was notified and sent in med

## 2019-07-28 NOTE — Telephone Encounter (Signed)
Pt would like a refill on jardiance and imitriex. Pt states she had 6 tablets prescribed back in December and she has been using 1-2 a month and took her last one yesterday. Is this okay to [facility]

## 2019-07-28 NOTE — Telephone Encounter (Signed)
Ok to refill. Please remind her that she is overdue for a visit with her cardiologist. Ask her to call and schedule.

## 2019-07-31 ENCOUNTER — Other Ambulatory Visit: Payer: Self-pay | Admitting: Internal Medicine

## 2019-07-31 ENCOUNTER — Telehealth: Payer: Self-pay | Admitting: Internal Medicine

## 2019-07-31 DIAGNOSIS — E119 Type 2 diabetes mellitus without complications: Secondary | ICD-10-CM

## 2019-07-31 MED ORDER — SUMATRIPTAN SUCCINATE 100 MG PO TABS: 100.0000 mg | ORAL_TABLET | ORAL | 0 refills | Status: DC | PRN

## 2019-07-31 MED ORDER — JARDIANCE 10 MG PO TABS: 10.0000 mg | ORAL_TABLET | Freq: Every day | ORAL | 0 refills | Status: DC

## 2019-07-31 NOTE — Telephone Encounter (Signed)
error 

## 2019-08-14 ENCOUNTER — Other Ambulatory Visit: Payer: Self-pay | Admitting: Cardiovascular Disease

## 2019-08-14 NOTE — Telephone Encounter (Signed)
*  STAT* If patient is at the pharmacy, call can be transferred to refill team.   1. Which medications need to be refilled? (please list name of each medication and dose if known)  atorvastatin (LIPITOR) 10 MG tablet lisinopril (ZESTRIL) 2.5 MG tablet  2. Which pharmacy/location (including street and city if local pharmacy) is medication to be sent to? [facility] [place] [place] AT Portal to [facility]  3. Do they need a 30 day or 90 day supply? 90 day  Patient has an appointment with Kerin Ransom 08/21/2019.

## 2019-08-21 ENCOUNTER — Other Ambulatory Visit: Payer: Self-pay | Admitting: Cardiovascular Disease

## 2019-08-21 ENCOUNTER — Encounter: Payer: Self-pay | Admitting: Cardiology

## 2019-08-21 ENCOUNTER — Telehealth (INDEPENDENT_AMBULATORY_CARE_PROVIDER_SITE_OTHER): Payer: Managed Care, Other (non HMO) | Admitting: Cardiology

## 2019-08-21 VITALS — HR 100 | Temp 96.0°F | Ht 66.0 in | Wt 239.0 lb

## 2019-08-21 DIAGNOSIS — E66812 Obesity, class 2: Secondary | ICD-10-CM

## 2019-08-21 DIAGNOSIS — I5042 Chronic combined systolic (congestive) and diastolic (congestive) heart failure: Secondary | ICD-10-CM

## 2019-08-21 DIAGNOSIS — K7581 Nonalcoholic steatohepatitis (NASH): Secondary | ICD-10-CM

## 2019-08-21 DIAGNOSIS — I428 Other cardiomyopathies: Secondary | ICD-10-CM

## 2019-08-21 DIAGNOSIS — E118 Type 2 diabetes mellitus with unspecified complications: Secondary | ICD-10-CM

## 2019-08-21 DIAGNOSIS — E782 Mixed hyperlipidemia: Secondary | ICD-10-CM

## 2019-08-21 MED ORDER — LISINOPRIL 2.5 MG PO TABS: 2.5000 mg | ORAL_TABLET | Freq: Every day | ORAL | 3 refills | Status: DC

## 2019-08-21 MED ORDER — POTASSIUM CHLORIDE CRYS ER 20 MEQ PO TBCR: 20.0000 meq | EXTENDED_RELEASE_TABLET | Freq: Every day | ORAL | 3 refills | Status: DC

## 2019-08-21 MED ORDER — ATORVASTATIN CALCIUM 10 MG PO TABS: 10.0000 mg | ORAL_TABLET | Freq: Every day | ORAL | 3 refills | Status: DC

## 2019-08-21 NOTE — Patient Instructions (Signed)
Medication Instructions:  Your physician recommends that you continue on your current medications as directed. Please refer to the Current Medication list given to you today.  *If you need a refill on your cardiac medications before your next appointment, please call your pharmacy*   Follow-Up: At [facility], you and your health needs are our priority.  As part of our continuing mission to provide you with exceptional heart care, we have created designated Provider Care Teams.  These Care Teams include your primary Cardiologist (physician) and Advanced Practice Providers (APPs -  Physician Assistants and Nurse Practitioners) who all work together to provide you with the care you need, when you need it.  We recommend signing up for the patient portal called "MyChart".  Sign up information is provided on this After Visit Summary.  MyChart is used to connect with patients for Virtual Visits (Telemedicine).  Patients are able to view lab/test results, encounter notes, upcoming appointments, etc.  Non-urgent messages can be sent to your provider as well.   To learn more about what you can do with MyChart, go to [email or web address].    Your next appointment:   12 month(s)  The format for your next appointment:   In Person  Provider:   You may see Skeet Latch, MD or one of the following Advanced Practice Providers on your designated Care Team:    Kerin Ransom, PA-C  [place], [place]  Coletta Memos, [place]    Other Instructions Please call our office 2 months in advance to schedule your follow-up appointment with Dr. Oval Linsey.

## 2019-08-21 NOTE — Progress Notes (Signed)
Virtual Visit via Telephone Note   This visit type was conducted due to national recommendations for restrictions regarding the COVID-19 Pandemic (e.g. social distancing) in an effort to limit this patient's exposure and mitigate transmission in our community.  Due to her co-morbid illnesses, this patient is at least at moderate risk for complications without adequate follow up.  This format is felt to be most appropriate for this patient at this time.  The patient did not have access to video technology/had technical difficulties with video requiring transitioning to audio format only (telephone).  All issues noted in this document were discussed and addressed.  No physical exam could be performed with this format.  Please refer to the patient's chart for her  consent to telehealth for [facility].   The patient was identified using 2 identifiers.  Date:  08/21/2019   ID:  Carolyn Salazar, DOB 1971/01/15, MRN [id number]  Patient Location: Home Provider Location: Home  PCP:  Girtha Rm, NP-C  Cardiologist:  Skeet Latch, MD  Electrophysiologist:  None   Evaluation Performed:  Follow-Up Visit  Chief Complaint:  none  History of Present Illness:    Carolyn Salazar is a 49 y.o. female with a history of nonischemic cardiomyopathy.  In 2000 she had an ejection fraction of 20%.  Catheterization was done then when she lived in [place], revealed normal coronaries.  In 2017 she had some chest pain.  Nuclear stress test was abnormal.  Diagnostic catheterization in March 2017 showed no significant coronary disease.  Her ejection fraction then was 45 to 50%.  Other medical problems include non-insulin-dependent diabetes, dyslipidemia, and obesity.  Her last office visit with Dr. Oval Linsey was in January 2020.  She has been following up with her primary care provider.  When Dr. Oval Linsey saw her in January 2020 she was tried on Vascepa for hypertriglyceridemia.  Apparently her lipids did not  improve and she ended up stopping this.  She has had elevated LFTs on a higher dose statin in the past.  She was changed to atorvastatin 10 mg.  Lipids done in October 2020 showed triglycerides of 207, LDL 66, total cholesterol 145.  She was contacted today for routine follow-up.  Since we saw her last she has had no new medical issues.  She denies any unusual chest pain or shortness of breath.  She is tolerating her medications well.  Unfortunately she has been unable to lose weight.  The patient does not have symptoms concerning for COVID-19 infection (fever, chills, cough, or new shortness of breath).    Past Medical History:  Diagnosis Date  . Arthritis    Hands, Knees RT>LT  . BENIGN NEOPLASM OF SKIN SITE UNSPECIFIED   . BUNIONS, BILATERAL   . CARDIOMYOPATHY 02/1999   EF 20% in 02/1999, improved over time-  EF 40-45% at cath 2017  . Chest pain    normal coronaries 2000 and 2017 (after an abnormal Myoview)  . DEPRESSION   . DIABETES MELLITUS, TYPE II, CONTROLLED, MILD   . DYSLIPIDEMIA   . Hypotension    November, 2012  . METABOLIC SYNDROME X    hypertriglycerides 04/2008, hyperglycemia  . MIGRAINE HEADACHE   . NASH (nonalcoholic steatohepatitis)   . OBESITY   . Shingles (herpes zoster) polyneuropathy   . Sinus tachycardia    Past Surgical History:  Procedure Laterality Date  . BUNIONECTOMY  2012   LEFT  . BUNIONECTOMY Right   . CARDIAC CATHETERIZATION  2000   no CAD  .  CARDIAC CATHETERIZATION N/A 08/25/2015   Procedure: Left Heart Cath and Coronary Angiography;  Surgeon: Troy Sine, MD;  Location: [facility];  Service: Cardiovascular;  Laterality: N/A;  . heart biopsy    . vein scope       Current Meds  Medication Sig  . ARIPiprazole (ABILIFY) 5 MG tablet Take 7.5 mg by mouth daily.   Marland Kitchen atorvastatin (LIPITOR) 10 MG tablet Take 1 tablet (10 mg total) by mouth daily. Schedule ov for further refills.  Marland Kitchen b complex vitamins tablet Take 1 tablet by mouth daily.   . carvedilol (COREG) 25 MG tablet TAKE 1 TABLET BY MOUTH TWO  TIMES DAILY WITH A MEAL  . cetirizine (ZYRTEC) 10 MG tablet Take 10 mg by mouth daily.    . digoxin (LANOXIN) 0.125 MG tablet TAKE 1 TABLET BY MOUTH  DAILY  . empagliflozin (JARDIANCE) 10 MG TABS tablet Take 10 mg by mouth daily.  . Esomeprazole Magnesium (NEXIUM PO) Take 1 capsule by mouth daily. Reported on 07/21/2015  . Eszopiclone 3 MG TABS Take 3 mg by mouth at bedtime as needed.  Marland Kitchen FLUoxetine (PROZAC) 40 MG capsule Take 1 capsule (40 mg total) by mouth daily. (Patient taking differently: Take 80 mg by mouth daily. )  . furosemide (LASIX) 40 MG tablet TAKE 1 TABLET BY MOUTH  DAILY  . Glucosamine-Chondroitin (GLUCOSAMINE CHONDR COMPLEX) 500-400 MG CAPS Take 1 tablet by mouth daily.   Marland Kitchen glucose blood test strip Test once a day. Pt uses one touch verio meter  . lamoTRIgine (LAMICTAL) 200 MG tablet Take 400 mg by mouth daily.   Marland Kitchen lisinopril (ZESTRIL) 2.5 MG tablet Take 1 tablet (2.5 mg total) by mouth daily. NEEDS APPOINTMENT FOR FUTURE REFILLS  . metFORMIN (GLUCOPHAGE) 1000 MG tablet TAKE 1 TABLET BY MOUTH  TWICE DAILY WITH A MEAL  . potassium chloride SA (KLOR-CON) 20 MEQ tablet Take 1 tablet (20 mEq total) by mouth daily. NEEDS APPOINTMENT FOR FUTURE REFILLS  . SUMAtriptan (IMITREX) 100 MG tablet Take 1 tablet (100 mg total) by mouth every 2 (two) hours as needed for migraine (no more than 2 tablets in a 24 hr period). May repeat in 2 hours if headache persists or recurs.   Current Facility-Administered Medications for the 08/21/19 encounter (Telemedicine) with Erlene Quan, PA-C  Medication  . levonorgestrel (MIRENA) 20 MCG/24HR IUD     Allergies:   Sulfonamide derivatives   Social History   Tobacco Use  . Smoking status: Never Smoker  . Smokeless tobacco: Never Used  Substance Use Topics  . Alcohol use: Yes    Alcohol/week: 4.0 standard drinks    Types: 4 Glasses of wine per week    Comment: 1 glass of wine every  other week.   . Drug use: No     Family Hx: The patient's family history includes Arthritis in her sister and another family member; Cancer in her mother; Diabetes in her father and mother; Diabetes type II in her mother; Heart attack in her father; Heart failure in her father; Hyperlipidemia in her mother and another family member; Hypertension in an other family member.  ROS:   Please see the history of present illness.    All other systems reviewed and are negative.   Prior CV studies:   The following studies were reviewed today: Cath 2017  Labs/Other Tests and Data Reviewed:    EKG:  An ECG dated 01/07/2018 was personally reviewed today and demonstrated:  NSR, NSST changes  Recent Labs: 03/20/2019: ALT 32; BUN 13; Creatinine, Ser 0.66; Hemoglobin 14.5; Platelets 341; Potassium 3.8; Sodium 140; TSH 3.100   Recent Lipid Panel Lab Results  Component Value Date/Time   CHOL 145 03/20/2019 03:03 PM   TRIG 207 (H) 03/20/2019 03:03 PM   HDL 45 03/20/2019 03:03 PM   CHOLHDL 3.2 03/20/2019 03:03 PM   CHOLHDL 4.5 03/29/2016 09:07 AM   LDLCALC 66 03/20/2019 03:03 PM   LDLDIRECT 130.8 01/15/2014 12:09 PM    Wt Readings from Last 3 Encounters:  08/21/19 239 lb (108.4 kg)  04/28/19 235 lb (106.6 kg)  04/24/19 235 lb (106.6 kg)     Objective:    Vital Signs:  Pulse 100   Temp (!) 96 F (35.6 C)   Ht 5' 6"  (1.676 m)   Wt 239 lb (108.4 kg)   BMI 38.58 kg/m    VITAL SIGNS:  reviewed  ASSESSMENT & PLAN:    NICM- Viral cardiomyopathy in 2000. LVEF improved from 20%.. Cardiac catheterization revealed normal coronary arteries at the time of diagnosis. Last EF was at cath 2017- 45-50%.   Dyslipidemia- Stable on low dose statin Rx though triglycerides still > 200  NIDDM- On Jardiance- PCP folowing  Obesity- BMI 38- weight loss discussed  NASH- LFTs in Oct 2020 normal.  Plan: F/U Dr Oval Linsey in one year.  COVID-19 Education: The signs and symptoms of COVID-19 were  discussed with the patient and how to seek care for testing (follow up with PCP or arrange E-visit).  The importance of social distancing was discussed today.  Time:   Today, I have spent 10 minutes with the patient with telehealth technology discussing the above problems.     Medication Adjustments/Labs and Tests Ordered: Current medicines are reviewed at length with the patient today.  Concerns regarding medicines are outlined above.   Tests Ordered: No orders of the defined types were placed in this encounter.   Medication Changes: No orders of the defined types were placed in this encounter.   Follow Up:  In Person Dr Oval Linsey in one year  Signed, Kerin Ransom, Hershal Coria  08/21/2019 10:07 AM    [place]

## 2019-08-21 NOTE — Telephone Encounter (Signed)
*  STAT* If patient is at the pharmacy, call can be transferred to refill team.   1. Which medications need to be refilled? (please list name of each medication and dose if known)  atorvastatin (LIPITOR) 10 MG tablet lisinopril (ZESTRIL) 2.5 MG tablet potassium chloride SA (KLOR-CON) 20 MEQ tablet  2. Which pharmacy/location (including street and city if local pharmacy) is medication to be sent to?  [id number] [place] [place] [address]. Do they need a 30 day or 90 day supply? 90   Patient is having a hard time getting the rx from the mail order service so she needs to pick them up from her local pharmacy. Pt only has two days left of medication

## 2019-09-18 ENCOUNTER — Encounter: Payer: Self-pay | Admitting: Family Medicine

## 2019-09-18 ENCOUNTER — Other Ambulatory Visit: Payer: Self-pay

## 2019-09-18 ENCOUNTER — Other Ambulatory Visit: Payer: Self-pay | Admitting: Family Medicine

## 2019-09-18 ENCOUNTER — Ambulatory Visit (INDEPENDENT_AMBULATORY_CARE_PROVIDER_SITE_OTHER): Payer: Managed Care, Other (non HMO) | Admitting: Family Medicine

## 2019-09-18 VITALS — BP 120/70 | HR 70 | Temp 97.5°F | Wt 237.4 lb

## 2019-09-18 DIAGNOSIS — E559 Vitamin D deficiency, unspecified: Secondary | ICD-10-CM | POA: Diagnosis not present

## 2019-09-18 DIAGNOSIS — I5042 Chronic combined systolic (congestive) and diastolic (congestive) heart failure: Secondary | ICD-10-CM

## 2019-09-18 DIAGNOSIS — E118 Type 2 diabetes mellitus with unspecified complications: Secondary | ICD-10-CM | POA: Diagnosis not present

## 2019-09-18 DIAGNOSIS — B379 Candidiasis, unspecified: Secondary | ICD-10-CM

## 2019-09-18 DIAGNOSIS — E782 Mixed hyperlipidemia: Secondary | ICD-10-CM

## 2019-09-18 DIAGNOSIS — K7581 Nonalcoholic steatohepatitis (NASH): Secondary | ICD-10-CM | POA: Diagnosis not present

## 2019-09-18 LAB — POCT GLYCOSYLATED HEMOGLOBIN (HGB A1C): Hemoglobin A1C: 6 % — AB (ref 4.0–5.6)

## 2019-09-18 MED ORDER — BLOOD GLUCOSE TEST VI STRP: ORAL_STRIP | 1 refills | Status: DC

## 2019-09-18 MED ORDER — ONETOUCH DELICA PLUS LANCET33G MISC: 1 refills | Status: DC

## 2019-09-18 NOTE — Patient Instructions (Addendum)
  Your hemoglobin A1c is 6.0% and your diabetes is well controlled.  Stop taking Jardiance.  Lets see if the yeast infections improved.  Continue taking Metformin twice daily.  Start checking your blood sugar at least 2-3 times per week when you are fasting or 2 hours after a meal.   Normal fasting blood sugar is 80-130.  Normal blood sugars 2 hours after a meal is 130-160. If you start seeing readings elevated especially  >200, let me know.    Limit your sweets and carbohydrates.  Try to get at least 150 minutes of physical activity each week.  I will be in touch with your lab results.  I will see you back in 4 months or sooner if needed

## 2019-09-18 NOTE — Progress Notes (Signed)
   Subjective:    Patient ID: Carolyn Salazar, female    DOB: 04/23/1971, 49 y.o.   MRN: [id number]  HPI Chief Complaint  Patient presents with  . follow-up    med check   She is here to follow-up on chronic health conditions including vitamin D deficiency and diabetes.  Taking 2,000 IUs of vitamin D daily   States she is taking Metformin twice daily and Jardiance daily.  She does not check blood sugars at home.  States she does not have a meter.  States she is having chronic yeast infections.  Seeing her OB/GYN for this.  Having migraine headaches once monthly with her menses. Takes Imitrex prn    Dr. Dellis Filbert is her OB/GYN  IUD now   Review of Systems Pertinent positives and negatives in the history of present illness.     Objective:   Physical Exam BP 120/70   Pulse 70   Temp (!) 97.5 F (36.4 C)   Wt 237 lb 6.4 oz (107.7 kg)   BMI 38.32 kg/m   Alert and oriented and in no acute distress.  Not otherwise examined.      Assessment & Plan:  Controlled type 2 diabetes mellitus with complication, without long-term current use of insulin ([place]) - Plan: HgB A1c, CBC with Differential/Platelet, Comprehensive metabolic panel -Discussed that her hemoglobin A1c is 6.0% and her diabetes is well controlled.  Since she is having chronic yeast infections, I recommend that we stop her Jardiance.  Discussed how this may be contributing to her symptoms.  She will continue on Metformin twice daily.  She was provided with a meter and testing supplies and taught how to check her blood sugar at home.  Encouraged her to let me know if her blood sugars are starting to increase.  Recommend low sugar and low carb diet as well as increasing physical activity.  Chronic combined systolic and diastolic CHF (congestive heart failure) (HCC) -Asymptomatic.  Managed by cardiology  Vitamin D deficiency - Plan: VITAMIN D 25 Hydroxy (Vit-D Deficiency, Fractures) -Check vitamin D level and  follow-up  NASH (nonalcoholic steatohepatitis) -Check liver function and follow-up  Mixed hyperlipidemia - Plan: Lipid panel -She is taking her statin without any issues.  Check lipid panel and follow-up  Yeast infection -Managed by her OB/GYN.  Hopefully this will improve by stopping SGLT-2, Jardiance.  She will let me know.  If she is not noticing any improvement then we can always choose to put her back on Jardiance if needed.

## 2019-09-19 LAB — COMPREHENSIVE METABOLIC PANEL
ALT: 32 IU/L (ref 0–32)
AST: 27 IU/L (ref 0–40)
Albumin/Globulin Ratio: 1.8 (ref 1.2–2.2)
Albumin: 4.6 g/dL (ref 3.8–4.8)
Alkaline Phosphatase: 120 IU/L — ABNORMAL HIGH (ref 39–117)
BUN/Creatinine Ratio: 16 (ref 9–23)
BUN: 12 mg/dL (ref 6–24)
Bilirubin Total: 0.8 mg/dL (ref 0.0–1.2)
CO2: 21 mmol/L (ref 20–29)
Calcium: 9.7 mg/dL (ref 8.7–10.2)
Chloride: 101 mmol/L (ref 96–106)
Creatinine, Ser: 0.76 mg/dL (ref 0.57–1.00)
GFR calc Af Amer: 107 mL/min/{1.73_m2} (ref >59)
GFR calc non Af Amer: 93 mL/min/{1.73_m2} (ref >59)
Globulin, Total: 2.5 g/dL (ref 1.5–4.5)
Glucose: 152 mg/dL — ABNORMAL HIGH (ref 65–99)
Potassium: 4.7 mmol/L (ref 3.5–5.2)
Sodium: 138 mmol/L (ref 134–144)
Total Protein: 7.1 g/dL (ref 6.0–8.5)

## 2019-09-19 LAB — LIPID PANEL
Chol/HDL Ratio: 3.2 ratio (ref 0.0–4.4)
Cholesterol, Total: 159 mg/dL (ref 100–199)
HDL: 49 mg/dL (ref >39)
LDL Chol Calc (NIH): 72 mg/dL (ref 0–99)
Triglycerides: 231 mg/dL — ABNORMAL HIGH (ref 0–149)
VLDL Cholesterol Cal: 38 mg/dL (ref 5–40)

## 2019-09-19 LAB — CBC WITH DIFFERENTIAL/PLATELET
Basophils Absolute: 0.1 10*3/uL (ref 0.0–0.2)
Basos: 1 %
EOS (ABSOLUTE): 0.5 10*3/uL — ABNORMAL HIGH (ref 0.0–0.4)
Eos: 4 %
Hematocrit: 47.1 % — ABNORMAL HIGH (ref 34.0–46.6)
Hemoglobin: 16 g/dL — ABNORMAL HIGH (ref 11.1–15.9)
Immature Grans (Abs): 0 10*3/uL (ref 0.0–0.1)
Immature Granulocytes: 0 %
Lymphocytes Absolute: 3.2 10*3/uL — ABNORMAL HIGH (ref 0.7–3.1)
Lymphs: 28 %
MCH: 29.6 pg (ref 26.6–33.0)
MCHC: 34 g/dL (ref 31.5–35.7)
MCV: 87 fL (ref 79–97)
Monocytes Absolute: 1 10*3/uL — ABNORMAL HIGH (ref 0.1–0.9)
Monocytes: 9 %
Neutrophils Absolute: 6.6 10*3/uL (ref 1.4–7.0)
Neutrophils: 58 %
Platelets: 385 10*3/uL (ref 150–450)
RBC: 5.4 x10E6/uL — ABNORMAL HIGH (ref 3.77–5.28)
RDW: 14.7 % (ref 11.7–15.4)
WBC: 11.3 10*3/uL — ABNORMAL HIGH (ref 3.4–10.8)

## 2019-09-19 LAB — VITAMIN D 25 HYDROXY (VIT D DEFICIENCY, FRACTURES): Vit D, 25-Hydroxy: 25.9 ng/mL — ABNORMAL LOW (ref 30.0–100.0)

## 2019-09-29 ENCOUNTER — Telehealth: Payer: Self-pay | Admitting: Family Medicine

## 2019-09-29 NOTE — Telephone Encounter (Signed)
Pt left message and said her mail in pharmacy never received her prescription for her test strips and she is now out. She wanted to see if [facility] could [facility] [address]

## 2019-09-30 MED ORDER — BLOOD GLUCOSE TEST VI STRP: ORAL_STRIP | 2 refills | Status: DC

## 2019-09-30 NOTE — Telephone Encounter (Signed)
Ok to take care of this for her. Thanks.

## 2019-09-30 NOTE — Telephone Encounter (Signed)
I refilled test strips

## 2019-10-03 ENCOUNTER — Other Ambulatory Visit: Payer: Self-pay | Admitting: Family Medicine

## 2019-10-03 ENCOUNTER — Encounter: Payer: Self-pay | Admitting: Family Medicine

## 2019-10-03 DIAGNOSIS — E118 Type 2 diabetes mellitus with unspecified complications: Secondary | ICD-10-CM

## 2019-10-03 MED ORDER — LINAGLIPTIN 5 MG PO TABS: 5.0000 mg | ORAL_TABLET | Freq: Every day | ORAL | 1 refills | Status: DC

## 2019-10-20 ENCOUNTER — Encounter: Payer: Self-pay | Admitting: Family Medicine

## 2019-11-12 ENCOUNTER — Other Ambulatory Visit: Payer: Self-pay | Admitting: Obstetrics & Gynecology

## 2019-12-01 ENCOUNTER — Emergency Department (INDEPENDENT_AMBULATORY_CARE_PROVIDER_SITE_OTHER)
Admission: EM | Admit: 2019-12-01 | Discharge: 2019-12-01 | Disposition: A | Discharge status: Home / Self Care | Payer: Managed Care, Other (non HMO) | Source: Home / Self Care

## 2019-12-01 ENCOUNTER — Emergency Department (INDEPENDENT_AMBULATORY_CARE_PROVIDER_SITE_OTHER): Payer: Managed Care, Other (non HMO)

## 2019-12-01 ENCOUNTER — Other Ambulatory Visit: Payer: Self-pay

## 2019-12-01 DIAGNOSIS — M79641 Pain in right hand: Secondary | ICD-10-CM | POA: Diagnosis not present

## 2019-12-01 DIAGNOSIS — S60221A Contusion of right hand, initial encounter: Secondary | ICD-10-CM

## 2019-12-01 NOTE — ED Triage Notes (Signed)
Patient presents to Urgent Care with complaints of right hand pain since last week. Patient reports she was walking her dog and he ran, causing her to fall an catch herself w/ the right hand. Bruising noted.

## 2019-12-01 NOTE — ED Provider Notes (Signed)
[facility]    CSN: [id number] Arrival date & time: 12/01/19  1009      History   Chief Complaint Chief Complaint  Patient presents with  . Hand Pain    Right    HPI Carolyn Salazar is a 49 y.o. female.   HPI Carolyn Salazar is a 49 y.o. female presenting to UC with c/o Right hand pain that started last week after trip and fall while walking her dog.  Pain and bruising to ulnar side of hand. Pain is 8/67, worse with certain movements.    Past Medical History:  Diagnosis Date  . Arthritis    Hands, Knees RT>LT  . BENIGN NEOPLASM OF SKIN SITE UNSPECIFIED   . BUNIONS, BILATERAL   . CARDIOMYOPATHY 02/1999   EF 20% in 02/1999, improved over time-  EF 40-45% at cath 2017  . Chest pain    normal coronaries 2000 and 2017 (after an abnormal Myoview)  . DEPRESSION   . DIABETES MELLITUS, TYPE II, CONTROLLED, MILD   . DYSLIPIDEMIA   . Hypotension    November, 2012  . METABOLIC SYNDROME X    hypertriglycerides 04/2008, hyperglycemia  . MIGRAINE HEADACHE   . NASH (nonalcoholic steatohepatitis)   . OBESITY   . Shingles (herpes zoster) polyneuropathy   . Sinus tachycardia     Patient Active Problem List   Diagnosis Date Noted  . Yeast infection 09/18/2019  . NASH (nonalcoholic steatohepatitis) 08/21/2019  . Controlled diabetes mellitus type 2 with complications ([place]) 67/20/9470  . Vitamin D deficiency 11/30/2017  . Vulvar abscess 02/28/2016  . Abnormal nuclear stress test   . Ejection fraction   . Anemia 09/07/2011  . Chronic combined systolic and diastolic CHF (congestive heart failure) ([place]) 07/18/2011  . Chest pain   . HEARING LOSS 08/02/2010  . BUNIONS, BILATERAL 08/02/2010  . BENIGN NEOPLASM OF SKIN SITE UNSPECIFIED 01/10/2010  . Hyperlipidemia 10/07/2009  . METABOLIC SYNDROME X 96/28/3662  . OBESITY 10/07/2009  . Depression 10/07/2009  . Migraine headache 10/07/2009  . ARTHRITIS 10/07/2009  . CHICKENPOX, HX OF 10/07/2009  . Nonischemic  cardiomyopathy ([place]) 02/20/1999    Past Surgical History:  Procedure Laterality Date  . BUNIONECTOMY  2012   LEFT  . BUNIONECTOMY Right   . CARDIAC CATHETERIZATION  2000   no CAD  . CARDIAC CATHETERIZATION N/A 08/25/2015   Procedure: Left Heart Cath and Coronary Angiography;  Surgeon: Troy Sine, MD;  Location: [facility];  Service: Cardiovascular;  Laterality: N/A;  . heart biopsy    . vein scope      OB History    Gravida  1   Para  1   Term  1   Preterm      AB      Living  1     SAB      TAB      Ectopic      Multiple      Live Births  1            Home Medications    Prior to Admission medications   Medication Sig Start Date End Date Taking? Authorizing Provider  empagliflozin (JARDIANCE) 10 MG TABS tablet Take by mouth daily.   Yes [provider]  ARIPiprazole (ABILIFY) 5 MG tablet Take 7.5 mg by mouth daily.     [provider]  atorvastatin (LIPITOR) 10 MG tablet Take 1 tablet (10 mg total) by mouth daily. 08/21/19   Skeet Latch, MD  b complex vitamins tablet Take 1 tablet by mouth daily.    [provider]  carvedilol (COREG) 25 MG tablet TAKE 1 TABLET BY MOUTH TWO  TIMES DAILY WITH A MEAL 12/06/18   Skeet Latch, MD  cetirizine (ZYRTEC) 10 MG tablet Take 10 mg by mouth daily.      [provider]  Esomeprazole Magnesium (NEXIUM PO) Take 1 capsule by mouth daily. Reported on 07/21/2015    [provider]  Eszopiclone 3 MG TABS Take 3 mg by mouth at bedtime as needed. 03/12/19   [provider]  FLUoxetine (PROZAC) 40 MG capsule Take 1 capsule (40 mg total) by mouth daily. Patient taking differently: Take 80 mg by mouth daily.  01/15/14   Rowe Clack, MD  furosemide (LASIX) 40 MG tablet TAKE 1 TABLET BY MOUTH  DAILY 12/06/18   Skeet Latch, MD  Glucosamine-Chondroitin (GLUCOSAMINE CHONDR COMPLEX) 500-400 MG CAPS Take 1 tablet by mouth daily.     [provider]  Glucose Blood (BLOOD GLUCOSE TEST STRIPS) STRP Test 1-2 times a day. Pt uses onetouch verio flex meter. Dx e11.9 09/30/19   Henson, Vickie L, NP-C  glucose blood test strip Test once a day. Pt uses one touch verio meter 12/06/17   Henson, Vickie L, NP-C  lamoTRIgine (LAMICTAL) 200 MG tablet Take 400 mg by mouth daily.     [provider]  Lancets [facility] DELICA PLUS [id number]) MISC Test 1-2 times a day. Pt uses one touch verio flex meter dx e11.9 09/18/19   Henson, Vickie L, NP-C  linagliptin (TRADJENTA) 5 MG TABS tablet Take 1 tablet (5 mg total) by mouth daily. 10/03/19   Henson, Vickie L, NP-C  lisinopril (ZESTRIL) 2.5 MG tablet Take 1 tablet (2.5 mg total) by mouth daily. 08/21/19   Skeet Latch, MD  metFORMIN (GLUCOPHAGE) 1000 MG tablet TAKE 1 TABLET TWICE DAILY  WITH MEALS 09/18/19   Henson, Vickie L, NP-C  potassium chloride SA (KLOR-CON) 20 MEQ tablet Take 1 tablet (20 mEq total) by mouth daily. 08/21/19   Skeet Latch, MD  SUMAtriptan (IMITREX) 100 MG tablet Take 1 tablet (100 mg total) by mouth every 2 (two) hours as needed for migraine (no more than 2 tablets in a 24 hr period). May repeat in 2 hours if headache persists or recurs. 07/31/19   Henson, Vickie L, NP-C  terconazole (TERAZOL 3) 0.8 % vaginal cream USE 1 APPLICATOR VAGINALLY FOR 3 DAYS 11/18/19   Princess Bruins, MD    Family History Family History  Problem Relation Age of Onset  . Diabetes Mother   . Cancer Mother        Ovarian, benign mass  . Diabetes type II Mother   . Hyperlipidemia Mother   . Diabetes Father   . Heart failure Father   . Heart attack Father   . Hypertension Other        Parent  . Hyperlipidemia Other        parent  . Arthritis Other        parent, grandparent  . Arthritis Sister     Social History Social History   Tobacco Use  . Smoking status: Never Smoker  . Smokeless tobacco: Never Used  Vaping Use  . Vaping Use: Never used  Substance Use Topics  . Alcohol use:  Not Currently    Alcohol/week: 4.0 standard drinks    Types: 4 Glasses of wine per week    Comment: 1 glass of wine every  other week.   . Drug use: No     Allergies   Sulfonamide derivatives   Review of Systems Review of Systems  Musculoskeletal: Positive for arthralgias and joint swelling.  Skin: Positive for color change. Negative for wound.  Neurological: Negative for weakness and numbness.     Physical Exam Triage Vital Signs ED Triage Vitals  Enc Vitals Group     BP 12/01/19 1025 122/84     Pulse Rate 12/01/19 1025 91     Resp 12/01/19 1025 18     Temp 12/01/19 1025 98.4 F (36.9 C)     Temp Source 12/01/19 1025 Oral     SpO2 12/01/19 1025 97 %     Weight --      Height --      Head Circumference --      Peak Flow --      Pain Score 12/01/19 1022 4     Pain Loc --      Pain Edu? --      Excl. in [place]? --    No data found.  Updated Vital Signs BP 122/84 (BP Location: Right Arm)   Pulse 91   Temp 98.4 F (36.9 C) (Oral)   Resp 18   SpO2 97%   Visual Acuity Right Eye Distance:   Left Eye Distance:   Bilateral Distance:    Right Eye Near:   Left Eye Near:    Bilateral Near:     Physical Exam Vitals and nursing note reviewed.  Constitutional:      Appearance: Normal appearance. She is well-developed.  HENT:     Head: Normocephalic and atraumatic.  Cardiovascular:     Rate and Rhythm: Normal rate and regular rhythm.     Pulses:          Radial pulses are 2+ on the right side.  Pulmonary:     Effort: Pulmonary effort is normal.  Musculoskeletal:        General: Swelling and tenderness present. Normal range of motion.     Cervical back: Normal range of motion.     Comments: Right hand: mild edema to ulnar aspect, mild tenderness over 5ht metacarpal. Full ROM wrist and fingers.   Skin:    General: Skin is warm and dry.     Capillary Refill: Capillary refill takes less than 2 seconds.     Findings: Bruising present.  Neurological:     Mental  Status: She is alert and oriented to person, place, and time.     Sensory: No sensory deficit.  Psychiatric:        Behavior: Behavior normal.      UC Treatments / Results  Labs (all labs ordered are listed, but only abnormal results are displayed) Labs Reviewed - No data to display  EKG   Radiology DG Hand Complete Right  Result Date: 12/01/2019 CLINICAL DATA:  Pain and swelling after trauma few days ago. EXAM: RIGHT HAND - COMPLETE 3+ VIEW COMPARISON:  10/07/2009 FINDINGS: No acute fracture or dislocation.  No definite soft tissue swelling. IMPRESSION: No acute osseous abnormality. Electronically Signed   By: Abigail Miyamoto M.D.   On: 12/01/2019 11:26    Procedures Procedures (including critical care time)  Medications Ordered in UC Medications - No data to display  Initial Impression / Assessment and Plan / UC Course  I have reviewed the triage vital signs and the nursing notes.  Pertinent labs & imaging results that were available during my care  of the patient were reviewed by me and considered in my medical decision making (see chart for details).     Reviewed imaging with pt Reassured no fracture or dislocation Ace wrap applied for comfort F/u Sports Medicine in 1-2 weeks as needed AVS given  Final Clinical Impressions(s) / UC Diagnoses   Final diagnoses:  Contusion of right hand, initial encounter     Discharge Instructions      You may take 558m acetaminophen every 4-6 hours or in combination with ibuprofen 400-6046mevery 6-8 hours as needed for pain and inflammation.   Call to schedule a follow up appointment with Sports Medicine in 1-2 weeks if not improving.     ED Prescriptions    None     PDMP not reviewed this encounter.   PhNoe GensPAVermont7/13/21 [phone]

## 2019-12-01 NOTE — Discharge Instructions (Signed)
  You may take 599m acetaminophen every 4-6 hours or in combination with ibuprofen 400-6065mevery 6-8 hours as needed for pain and inflammation.   Call to schedule a follow up appointment with Sports Medicine in 1-2 weeks if not improving.

## 2019-12-02 ENCOUNTER — Telehealth: Payer: Self-pay

## 2019-12-02 NOTE — Telephone Encounter (Signed)
Received a fax from [facility] for a refill on the pts. Jardiance pt. Last apt was 09/18/19 and next apt is 01/19/20.

## 2019-12-02 NOTE — Telephone Encounter (Signed)
Pt was advised back in April at visit to stop jardiance

## 2019-12-05 ENCOUNTER — Telehealth: Payer: Self-pay

## 2019-12-05 NOTE — Telephone Encounter (Signed)
Pt. Called back about refill on Jardiance she said she went back on Jardiance because the other medication she was put on Tradjenta was causing her issues with her heart, so she does still need a refill on Jardiance sent in to [facility].

## 2019-12-08 MED ORDER — EMPAGLIFLOZIN 10 MG PO TABS: 10.0000 mg | ORAL_TABLET | Freq: Every day | ORAL | 0 refills | Status: DC

## 2019-12-08 MED ORDER — EMPAGLIFLOZIN 10 MG PO TABS: 10.0000 mg | ORAL_TABLET | Freq: Every day | ORAL | 2 refills | Status: DC

## 2019-12-08 NOTE — Telephone Encounter (Signed)
Pt was notified that med was sent in

## 2020-01-13 ENCOUNTER — Other Ambulatory Visit: Payer: Self-pay | Admitting: Family Medicine

## 2020-01-14 NOTE — Telephone Encounter (Signed)
Is this okay to refill? 

## 2020-01-19 ENCOUNTER — Ambulatory Visit: Payer: Medicare Other | Admitting: Family Medicine

## 2020-01-27 ENCOUNTER — Encounter: Payer: Self-pay | Admitting: Family Medicine

## 2020-01-27 ENCOUNTER — Ambulatory Visit (INDEPENDENT_AMBULATORY_CARE_PROVIDER_SITE_OTHER): Payer: Managed Care, Other (non HMO) | Admitting: Family Medicine

## 2020-01-27 VITALS — BP 124/82 | HR 96 | Temp 98.4°F | Wt 253.2 lb

## 2020-01-27 DIAGNOSIS — E559 Vitamin D deficiency, unspecified: Secondary | ICD-10-CM

## 2020-01-27 DIAGNOSIS — E118 Type 2 diabetes mellitus with unspecified complications: Secondary | ICD-10-CM

## 2020-01-27 DIAGNOSIS — Z23 Encounter for immunization: Secondary | ICD-10-CM | POA: Diagnosis not present

## 2020-01-27 DIAGNOSIS — E782 Mixed hyperlipidemia: Secondary | ICD-10-CM

## 2020-01-27 LAB — POCT GLYCOSYLATED HEMOGLOBIN (HGB A1C): Hemoglobin A1C: 6.4 % — AB (ref 4.0–5.6)

## 2020-01-27 NOTE — Patient Instructions (Signed)
Continue on your current medications but cut back on sweets and carbohydrates. Increase your physical activity as tolerated.

## 2020-01-27 NOTE — Progress Notes (Signed)
Subjective:    Patient ID: Carolyn Salazar, female    DOB: 1970-06-03, 49 y.o.   MRN: [id number]  Carolyn Salazar is a 49 y.o. female who presents for follow-up of Type 2 diabetes mellitus.  HTN and HL- followed by cardiologist, Dr .Carolyn Salazar  Vitamin D deficiency- taking 5,000 IUs daily as of last week. She was taking 4,000 Carolyn Salazar Gynecologist    Patient is not checking home blood sugars.   Home blood sugar records: has not checked How often is blood sugars being checked: none Current symptoms include: none. Patient denies hypoglycemia , nausea, paresthesia of the feet, polydipsia, polyuria, visual disturbances and weight loss.  Patient is checking their feet daily. Any Foot concerns (callous, ulcer, wound, thickened nails, toenail fungus, skin fungus, hammer toe): none  Last dilated eye exam: 02/2019  Current treatments: metformin and Jardiance . Medication compliance: good  Current diet: in general, a "healthy" diet   Current exercise: none Known diabetic complications: none  The following portions of the patient's history were reviewed and updated as appropriate: allergies, current medications, past medical history, past social history and problem list.  ROS as in subjective above.     Objective:    Physical Exam Alert and in no distress otherwise not examined.  Blood pressure 124/82, pulse 96, temperature 98.4 F (36.9 C), weight 253 lb 3.2 oz (114.9 kg).  Lab Review Diabetic Labs Latest Ref Rng & Units 01/27/2020 09/18/2019 03/20/2019 08/16/2018 08/09/2018  HbA1c 4.0 - 5.6 % 6.4(A) 6.0(A) 5.7(A) - 5.9(A)  Microalbumin 0.0 - 1.9 mg/dL - - - - -  Micro/Creat Ratio 0 - 29 mg/g creat - - <9 - -  Chol 100 - 199 mg/dL - 159 145 155 -  HDL >39 mg/dL - 49 45 48 -  Calc LDL 0 - 99 mg/dL - 72 66 66 -  Triglycerides 0 - 149 mg/dL - 231(H) 207(H) 204(H) -  Creatinine 0.57 - 1.00 mg/dL - 0.76 0.66 0.67 -  GFR >60.00 mL/min - - - - -    BP/Weight 01/27/2020 12/01/2019 09/18/2019 08/21/2019 2/77/4128  Systolic BP 786 767 209 - 470  Diastolic BP 82 84 70 - 82  Wt. (Lbs) 253.2 - 237.4 239 -  BMI 40.87 - 38.32 38.58 -   Foot/eye exam completion dates Latest Ref Rng & Units 03/20/2019 03/04/2019  Eye Exam No Retinopathy - No Retinopathy  Foot Form Completion - Done -    Carolyn Salazar  reports that she has never smoked. She has never used smokeless tobacco. She reports previous alcohol use of about 4.0 standard drinks of alcohol per week. She reports that she does not use drugs.     Assessment & Plan:    Controlled type 2 diabetes mellitus with complication, without long-term current use of insulin ([place]) - Plan: CBC with Differential/Platelet, Comprehensive metabolic panel, TSH, T4, free, Microalbumin / creatinine urine ratio, HgB A1c  Mixed hyperlipidemia - Plan: Lipid panel  Vitamin D deficiency - Plan: VITAMIN D 25 Hydroxy (Vit-D Deficiency, Fractures)  Needs flu shot - Plan: Flu Vaccine QUAD 6+ mos PF IM (Fluarix Quad PF)  1. Rx changes: none Hgb A1c 6.4% (higher but controlled)  2. Education: Reviewed ABCs of diabetes management (respective goals in parentheses):  A1C (<7), blood pressure (<130/80), and cholesterol (LDL <100). 3. Compliance at present is estimated to be good. Efforts to improve compliance (if necessary) will be directed at dietary modifications: cut back on carbohydrates and sugar and increased  exercise. 4. HTN- doing well on medications. Followed by cardiologist.  5. HL- continue statin and check lipids  6. Vitamin D def- continue 5,000 IUs and follow up pending results.  7. Follow up: 4 months.

## 2020-01-28 LAB — COMPREHENSIVE METABOLIC PANEL WITH GFR
ALT: 32 [IU]/L (ref 0–32)
AST: 24 [IU]/L (ref 0–40)
Albumin/Globulin Ratio: 1.8 (ref 1.2–2.2)
Albumin: 3.9 g/dL (ref 3.8–4.8)
Alkaline Phosphatase: 87 [IU]/L (ref 48–121)
BUN/Creatinine Ratio: 13 (ref 9–23)
BUN: 9 mg/dL (ref 6–24)
Bilirubin Total: 0.7 mg/dL (ref 0.0–1.2)
CO2: 26 mmol/L (ref 20–29)
Calcium: 9 mg/dL (ref 8.7–10.2)
Chloride: 102 mmol/L (ref 96–106)
Creatinine, Ser: 0.71 mg/dL (ref 0.57–1.00)
GFR calc Af Amer: 116 mL/min/{1.73_m2}
GFR calc non Af Amer: 101 mL/min/{1.73_m2}
Globulin, Total: 2.2 g/dL (ref 1.5–4.5)
Glucose: 115 mg/dL — ABNORMAL HIGH (ref 65–99)
Potassium: 3.5 mmol/L (ref 3.5–5.2)
Sodium: 142 mmol/L (ref 134–144)
Total Protein: 6.1 g/dL (ref 6.0–8.5)

## 2020-01-28 LAB — CBC WITH DIFFERENTIAL/PLATELET
Basophils Absolute: 0 10*3/uL (ref 0.0–0.2)
Basos: 0 %
EOS (ABSOLUTE): 0.4 10*3/uL (ref 0.0–0.4)
Eos: 5 %
Hematocrit: 39.7 % (ref 34.0–46.6)
Hemoglobin: 13.4 g/dL (ref 11.1–15.9)
Immature Grans (Abs): 0 10*3/uL (ref 0.0–0.1)
Immature Granulocytes: 0 %
Lymphocytes Absolute: 2.1 10*3/uL (ref 0.7–3.1)
Lymphs: 28 %
MCH: 30.2 pg (ref 26.6–33.0)
MCHC: 33.8 g/dL (ref 31.5–35.7)
MCV: 89 fL (ref 79–97)
Monocytes Absolute: 0.7 10*3/uL (ref 0.1–0.9)
Monocytes: 9 %
Neutrophils Absolute: 4.4 10*3/uL (ref 1.4–7.0)
Neutrophils: 58 %
Platelets: 278 10*3/uL (ref 150–450)
RBC: 4.44 x10E6/uL (ref 3.77–5.28)
RDW: 13.5 % (ref 11.7–15.4)
WBC: 7.6 10*3/uL (ref 3.4–10.8)

## 2020-01-28 LAB — LIPID PANEL
Chol/HDL Ratio: 3.4 ratio (ref 0.0–4.4)
Cholesterol, Total: 161 mg/dL (ref 100–199)
HDL: 48 mg/dL (ref >39)
LDL Chol Calc (NIH): 73 mg/dL (ref 0–99)
Triglycerides: 244 mg/dL — ABNORMAL HIGH (ref 0–149)
VLDL Cholesterol Cal: 40 mg/dL (ref 5–40)

## 2020-01-28 LAB — MICROALBUMIN / CREATININE URINE RATIO
Creatinine, Urine: 185.2 mg/dL
Microalb/Creat Ratio: 6 mg/g creat (ref 0–29)
Microalbumin, Urine: 10.3 ug/mL

## 2020-01-28 LAB — VITAMIN D 25 HYDROXY (VIT D DEFICIENCY, FRACTURES): Vit D, 25-Hydroxy: 28.8 ng/mL — ABNORMAL LOW (ref 30.0–100.0)

## 2020-01-28 LAB — T4, FREE: Free T4: 1.07 ng/dL (ref 0.82–1.77)

## 2020-01-28 LAB — TSH: TSH: 3.87 u[IU]/mL (ref 0.450–4.500)

## 2020-02-06 ENCOUNTER — Other Ambulatory Visit: Payer: Self-pay | Admitting: Cardiovascular Disease

## 2020-02-06 NOTE — Telephone Encounter (Signed)
°*  STAT* If patient is at the pharmacy, call can be transferred to refill team.   1. Which medications need to be refilled? (please list name of each medication and dose if known) furosemide 40 mg,Carvedilol 25 mg  2. Which pharmacy/location (including street and city if local pharmacy) [facility] [address]  3. Do they need a 30 day or 90 day supply? 90  .

## 2020-02-09 MED ORDER — FUROSEMIDE 40 MG PO TABS: 40.0000 mg | ORAL_TABLET | Freq: Every day | ORAL | 3 refills | Status: DC

## 2020-02-09 MED ORDER — CARVEDILOL 25 MG PO TABS: ORAL_TABLET | ORAL | 3 refills | Status: DC

## 2020-03-05 ENCOUNTER — Telehealth: Payer: Self-pay

## 2020-03-05 MED ORDER — EMPAGLIFLOZIN 10 MG PO TABS: 10.0000 mg | ORAL_TABLET | Freq: Every day | ORAL | 0 refills | Status: DC

## 2020-03-05 NOTE — Telephone Encounter (Signed)
Called in med

## 2020-03-05 NOTE — Telephone Encounter (Signed)
Pt. Called LM stating she needs a refill on [facility] pt. Last apt was 01/27/20 and next apt is 05/31/20.

## 2020-03-17 ENCOUNTER — Telehealth: Payer: Self-pay | Admitting: Family Medicine

## 2020-03-17 MED ORDER — METFORMIN HCL 1000 MG PO TABS: ORAL_TABLET | ORAL | 1 refills | Status: DC

## 2020-03-17 NOTE — Telephone Encounter (Signed)
done

## 2020-03-17 NOTE — Telephone Encounter (Signed)
Please refill.

## 2020-03-17 NOTE — Telephone Encounter (Signed)
Pt left Vm saying she needs refill on [facility]formin [facility] on [address].

## 2020-05-31 ENCOUNTER — Ambulatory Visit (INDEPENDENT_AMBULATORY_CARE_PROVIDER_SITE_OTHER): Payer: Managed Care, Other (non HMO) | Admitting: Family Medicine

## 2020-05-31 ENCOUNTER — Other Ambulatory Visit: Payer: Self-pay

## 2020-05-31 ENCOUNTER — Encounter: Payer: Self-pay | Admitting: Family Medicine

## 2020-05-31 VITALS — BP 120/70 | HR 95 | Ht 66.5 in | Wt 242.2 lb

## 2020-05-31 DIAGNOSIS — Z0000 Encounter for general adult medical examination without abnormal findings: Secondary | ICD-10-CM | POA: Diagnosis not present

## 2020-05-31 DIAGNOSIS — Z6838 Body mass index (BMI) 38.0-38.9, adult: Secondary | ICD-10-CM

## 2020-05-31 DIAGNOSIS — Z1211 Encounter for screening for malignant neoplasm of colon: Secondary | ICD-10-CM | POA: Diagnosis not present

## 2020-05-31 DIAGNOSIS — E1169 Type 2 diabetes mellitus with other specified complication: Secondary | ICD-10-CM

## 2020-05-31 DIAGNOSIS — E559 Vitamin D deficiency, unspecified: Secondary | ICD-10-CM | POA: Diagnosis not present

## 2020-05-31 DIAGNOSIS — E118 Type 2 diabetes mellitus with unspecified complications: Secondary | ICD-10-CM

## 2020-05-31 DIAGNOSIS — E785 Hyperlipidemia, unspecified: Secondary | ICD-10-CM

## 2020-05-31 LAB — POCT GLYCOSYLATED HEMOGLOBIN (HGB A1C): Hemoglobin A1C: 6.5 % — AB (ref 4.0–5.6)

## 2020-05-31 NOTE — Progress Notes (Signed)
Subjective:    Patient ID: Carolyn Salazar, female    DOB: Mar 29, 1971, 50 y.o.   MRN: [id number]  HPI Chief Complaint  Patient presents with  . cpe    Cpe and nonfasting, no concerns, sees obgyn   She is here for a complete physical exam and to follow up on DM, HTN, HL, GERD and vitamin D deficiency.   Other providers: Cardiology  OB/GYN   DM- taking Metformin and Jardiance without any concerns.  No BS from home, she does not check them. No exercise. Diet is fairly healthy per patient.   HTN- taking medications and doing well.   GERD- no longer taking Nexium. Doing ok with a PrePro-biotic   Vitamin D def- 5,000 IUs daily per patient   Social history: Lives with her husband. Daughter is working after 5 years  Denies smoking, drinking alcohol, drug use  Diet: healthy  Excerise: none   Immunizations: [facility]izer booster [facility]16[facility]  Health maintenance:  Mammogram: OB/GYN  Colonoscopy: never  Last Gynecological Exam: OB/GYN Last Dental Exam: 2 years ago  Last Eye Exam: October 2020   Wears seatbelt always, uses sunscreen, smoke detectors in home and functioning, does not text while driving and feels safe in home environment.   Reviewed allergies, medications, past medical, surgical, family, and social history.   Review of Systems Pertinent positives and negatives in the history of present illness.     Objective:   Physical Exam BP 120/70   Pulse 95   Ht 5' 6.5" (1.689 m)   Wt 242 lb 3.2 oz (109.9 kg)   BMI 38.51 kg/m   General Appearance:    Alert, cooperative, no distress, appears stated age  Head:    Normocephalic, without obvious abnormality, atraumatic  Eyes:    PERRL, conjunctiva/corneas clear, EOM's intact  Ears:    Normal TM's and external ear canals  Nose:   Mask on   Throat:   Mask on   Neck:   Supple, no lymphadenopathy;  thyroid:  no   enlargement/tenderness/nodules; no JVD  Back:    Spine nontender, no curvature, ROM normal   Lungs:     Clear to auscultation bilaterally without wheezes, rales or     ronchi; respirations unlabored  Chest Wall:    No tenderness or deformity   Heart:    Regular rate and rhythm, S1 and S2 normal, no murmur, rub   or gallop  Breast Exam:    OB/GYN  Abdomen:     Soft, non-tender, nondistended, normoactive bowel sounds,    no masses, no hepatosplenomegaly  Genitalia:    OB/GYN     Extremities:   No clubbing, cyanosis or edema  Pulses:   2+ and symmetric all extremities  Skin:   Skin color, texture, turgor normal, no rashes or lesions  Lymph nodes:   Cervical, supraclavicular, and axillary nodes normal  Neurologic:   CNII-XII intact, normal strength, sensation and gait          Psych:   Normal mood, affect, hygiene and grooming.          Assessment & Plan:  Routine general medical examination at a health care facility - Plan: CBC with Differential/Platelet, Comprehensive metabolic panel, TSH, T4, free -She is here for a CPE but is not fasting.  Preventive health care reviewed.  She sees her OB/GYN and has an upcoming appointment.  She will update her mammogram.  I also recommend regular dental and eye exams.  I will  refer her for her first screening colonoscopy.  Counseling on healthy lifestyle including diet and exercise.  Encouraged her to increase physical activity.  Immunizations reviewed.  Discussed safety  Controlled type 2 diabetes mellitus with complication, without long-term current use of insulin ([place]) - Plan: CBC with Differential/Platelet, Comprehensive metabolic panel, TSH, T4, free, POCT glycosylated hemoglobin (Hb A1C) -Hemoglobin A1c 6.5% and her diabetes is controlled.  Continue on current medications.  Recommend healthy, low-carb diet and increasing physical activity  Hyperlipidemia associated with type 2 diabetes mellitus ([place]) -Continue statin therapy.  Vitamin D deficiency - Plan: VITAMIN D 25 Hydroxy (Vit-D Deficiency, Fractures) -Check vitamin D level and  adjust dose as appropriate  Screen for colon cancer - Plan: Ambulatory referral to Gastroenterology  Class 2 severe obesity with serious comorbidity and body mass index (BMI) of 38.0 to 38.9 in adult, unspecified obesity type ([place]) -Counseling on healthy diet and increasing physical activity

## 2020-05-31 NOTE — Patient Instructions (Addendum)
I recommend increasing your physical activity to 150 minutes per week.  Your hemoglobin A1c is 6.5% and your diabetes is controlled.  Continue your current medications.   Call and schedule your diabetic eye exam.   Call and schedule with your dentist.   Follow up with your OB/GYN and cardiologist as recommended.    Preventive Care 49-49 Years Old, Female Preventive care refers to lifestyle choices and visits with your health care provider that can promote health and wellness. This includes:  A yearly physical exam. This is also called an annual wellness visit.  Regular dental and eye exams.  Immunizations.  Screening for certain conditions.  Healthy lifestyle choices, such as: ? Eating a healthy diet. ? Getting regular exercise. ? Not using drugs or products that contain nicotine and tobacco. ? Limiting alcohol use. What can I expect for my preventive care visit? Physical exam Your health care provider will check your:  Height and weight. These may be used to calculate your BMI (body mass index). BMI is a measurement that tells if you are at a healthy weight.  Heart rate and blood pressure.  Body temperature.  Skin for abnormal spots. Counseling Your health care provider may ask you questions about your:  Past medical problems.  Family's medical history.  Alcohol, tobacco, and drug use.  Emotional well-being.  Home life and relationship well-being.  Sexual activity.  Diet, exercise, and sleep habits.  Work and work Statistician.  Access to firearms.  Method of birth control.  Menstrual cycle.  Pregnancy history. What immunizations do I need? Vaccines are usually given at various ages, according to a schedule. Your health care provider will recommend vaccines for you based on your age, medical history, and lifestyle or other factors, such as travel or where you work.   What tests do I need? Blood tests  Lipid and cholesterol levels. These may be  checked every 5 years, or more often if you are over 68 years old.  Hepatitis C test.  Hepatitis B test. Screening  Lung cancer screening. You may have this screening every year starting at age 42 if you have a 30-pack-year history of smoking and currently smoke or have quit within the past 15 years.  Colorectal cancer screening. ? All adults should have this screening starting at age 27 and continuing until age 31. ? Your health care provider may recommend screening at age 49 if you are at increased risk. ? You will have tests every 1-10 years, depending on your results and the type of screening test.  Diabetes screening. ? This is done by checking your blood sugar (glucose) after you have not eaten for a while (fasting). ? You may have this done every 1-3 years.  Mammogram. ? This may be done every 1-2 years. ? Talk with your health care provider about when you should start having regular mammograms. This may depend on whether you have a family history of breast cancer.  BRCA-related cancer screening. This may be done if you have a family history of breast, ovarian, tubal, or peritoneal cancers.  Pelvic exam and Pap test. ? This may be done every 3 years starting at age 86. ? Starting at age 63, this may be done every 5 years if you have a Pap test in combination with an HPV test. Other tests  STD (sexually transmitted disease) testing, if you are at risk.  Bone density scan. This is done to screen for osteoporosis. You may have this scan if you  are at high risk for osteoporosis. Talk with your health care provider about your test results, treatment options, and if necessary, the need for more tests. Follow these instructions at home: Eating and drinking  Eat a diet that includes fresh fruits and vegetables, whole grains, lean protein, and low-fat dairy products.  Take vitamin and mineral supplements as recommended by your health care provider.  Do not drink alcohol  if: ? Your health care provider tells you not to drink. ? You are pregnant, may be pregnant, or are planning to become pregnant.  If you drink alcohol: ? Limit how much you have to 0-1 drink a day. ? Be aware of how much alcohol is in your drink. In the [place] one drink equals one 12 oz bottle of beer (355 mL), one 5 oz glass of wine (148 mL), or one 1 oz glass of hard liquor (44 mL).   Lifestyle  Take daily care of your teeth and gums. Brush your teeth every morning and night with fluoride toothpaste. Floss one time each day.  Stay active. Exercise for at least 30 minutes 5 or more days each week.  Do not use any products that contain nicotine or tobacco, such as cigarettes, e-cigarettes, and chewing tobacco. If you need help quitting, ask your health care provider.  Do not use drugs.  If you are sexually active, practice safe sex. Use a condom or other form of protection to prevent STIs (sexually transmitted infections).  If you do not wish to become pregnant, use a form of birth control. If you plan to become pregnant, see your health care provider for a prepregnancy visit.  If told by your health care provider, take low-dose aspirin daily starting at age 62.  Find healthy ways to cope with stress, such as: ? Meditation, yoga, or listening to music. ? Journaling. ? Talking to a trusted person. ? Spending time with friends and family. Safety  Always wear your seat belt while driving or riding in a vehicle.  Do not drive: ? If you have been drinking alcohol. Do not ride with someone who has been drinking. ? When you are tired or distracted. ? While texting.  Wear a helmet and other protective equipment during sports activities.  If you have firearms in your house, make sure you follow all gun safety procedures. What's next?  Visit your health care provider once a year for an annual wellness visit.  Ask your health care provider how often you should have your eyes and  teeth checked.  Stay up to date on all vaccines. This information is not intended to replace advice given to you by your health care provider. Make sure you discuss any questions you have with your health care provider. Document Revised: 02/10/2020 Document Reviewed: 01/17/2018 Elsevier Patient Education  2021 [employer].

## 2020-06-01 LAB — COMPREHENSIVE METABOLIC PANEL
ALT: 24 IU/L (ref 0–32)
AST: 15 IU/L (ref 0–40)
Albumin/Globulin Ratio: 2.2 (ref 1.2–2.2)
Albumin: 4.3 g/dL (ref 3.8–4.8)
Alkaline Phosphatase: 100 IU/L (ref 44–121)
BUN/Creatinine Ratio: 14 (ref 9–23)
BUN: 11 mg/dL (ref 6–24)
Bilirubin Total: 0.6 mg/dL (ref 0.0–1.2)
CO2: 24 mmol/L (ref 20–29)
Calcium: 9.8 mg/dL (ref 8.7–10.2)
Chloride: 100 mmol/L (ref 96–106)
Creatinine, Ser: 0.8 mg/dL (ref 0.57–1.00)
GFR calc Af Amer: 101 mL/min/{1.73_m2} (ref >59)
GFR calc non Af Amer: 87 mL/min/{1.73_m2} (ref >59)
Globulin, Total: 2 g/dL (ref 1.5–4.5)
Glucose: 122 mg/dL — ABNORMAL HIGH (ref 65–99)
Potassium: 3.7 mmol/L (ref 3.5–5.2)
Sodium: 144 mmol/L (ref 134–144)
Total Protein: 6.3 g/dL (ref 6.0–8.5)

## 2020-06-01 LAB — CBC WITH DIFFERENTIAL/PLATELET
Basophils Absolute: 0.1 10*3/uL (ref 0.0–0.2)
Basos: 1 %
EOS (ABSOLUTE): 0.5 10*3/uL — ABNORMAL HIGH (ref 0.0–0.4)
Eos: 4 %
Hematocrit: 43.3 % (ref 34.0–46.6)
Hemoglobin: 14.1 g/dL (ref 11.1–15.9)
Immature Grans (Abs): 0 10*3/uL (ref 0.0–0.1)
Immature Granulocytes: 0 %
Lymphocytes Absolute: 3.7 10*3/uL — ABNORMAL HIGH (ref 0.7–3.1)
Lymphs: 29 %
MCH: 28.6 pg (ref 26.6–33.0)
MCHC: 32.6 g/dL (ref 31.5–35.7)
MCV: 88 fL (ref 79–97)
Monocytes Absolute: 0.9 10*3/uL (ref 0.1–0.9)
Monocytes: 7 %
Neutrophils Absolute: 7.4 10*3/uL — ABNORMAL HIGH (ref 1.4–7.0)
Neutrophils: 59 %
Platelets: 393 10*3/uL (ref 150–450)
RBC: 4.93 x10E6/uL (ref 3.77–5.28)
RDW: 13.9 % (ref 11.7–15.4)
WBC: 12.6 10*3/uL — ABNORMAL HIGH (ref 3.4–10.8)

## 2020-06-01 LAB — T4, FREE: Free T4: 1.29 ng/dL (ref 0.82–1.77)

## 2020-06-01 LAB — VITAMIN D 25 HYDROXY (VIT D DEFICIENCY, FRACTURES): Vit D, 25-Hydroxy: 31.8 ng/mL (ref 30.0–100.0)

## 2020-06-01 LAB — TSH: TSH: 2.82 u[IU]/mL (ref 0.450–4.500)

## 2020-06-07 NOTE — Progress Notes (Signed)
Her mychart message has not been read.

## 2020-06-30 ENCOUNTER — Other Ambulatory Visit: Payer: Self-pay | Admitting: Family Medicine

## 2020-06-30 DIAGNOSIS — Z1231 Encounter for screening mammogram for malignant neoplasm of breast: Secondary | ICD-10-CM

## 2020-07-05 ENCOUNTER — Other Ambulatory Visit: Payer: Self-pay

## 2020-07-05 ENCOUNTER — Ambulatory Visit
Admission: RE | Admit: 2020-07-05 | Discharge: 2020-07-05 | Disposition: A | Discharge status: Home / Self Care | Payer: Managed Care, Other (non HMO) | Source: Ambulatory Visit | Attending: Family Medicine | Admitting: Family Medicine

## 2020-07-05 DIAGNOSIS — Z1231 Encounter for screening mammogram for malignant neoplasm of breast: Secondary | ICD-10-CM

## 2020-08-05 ENCOUNTER — Other Ambulatory Visit: Payer: Self-pay | Admitting: Cardiovascular Disease

## 2020-08-23 LAB — HM DIABETES EYE EXAM

## 2020-08-26 ENCOUNTER — Other Ambulatory Visit: Payer: Self-pay | Admitting: Family Medicine

## 2020-08-27 ENCOUNTER — Encounter: Payer: Self-pay | Admitting: Internal Medicine

## 2020-09-01 ENCOUNTER — Telehealth: Payer: Self-pay | Admitting: Family Medicine

## 2020-09-01 MED ORDER — SUMATRIPTAN SUCCINATE 100 MG PO TABS: ORAL_TABLET | ORAL | 0 refills | Status: DC

## 2020-09-01 NOTE — Telephone Encounter (Signed)
Refill med

## 2020-09-01 NOTE — Telephone Encounter (Signed)
Pt needs refill on Imitrex sent to [facility] on [address]

## 2020-09-01 NOTE — Telephone Encounter (Signed)
Ok to refill 

## 2020-09-12 ENCOUNTER — Other Ambulatory Visit: Payer: Self-pay | Admitting: Family Medicine

## 2020-09-23 ENCOUNTER — Ambulatory Visit: Payer: Managed Care, Other (non HMO) | Admitting: Cardiovascular Disease

## 2020-11-29 ENCOUNTER — Encounter: Payer: Managed Care, Other (non HMO) | Admitting: Family Medicine

## 2020-11-29 ENCOUNTER — Other Ambulatory Visit: Payer: Self-pay | Admitting: Family Medicine

## 2020-11-30 NOTE — Progress Notes (Signed)
Chief Complaint  Patient presents with   Diabetes    Nonfasting med check for diabetes, no concerns. Had covid booster #1, is not 50 but would like ot have #2. I told her I would ask you. She said she is diabetic and has cardiomyopathy-does this qualify her?   Medication Refill    Needs refill on terazol vaginal cream, originally rx'd by gyn but she uses for yeast infections caused by Jardiance.     Patient presents for 6 month follow-up on chronic issues.  Diabetes:  Patient reports compliance with metformin and jardiance, and denies side effects. She does not check blood sugars. She tries to limit sugar in her diet. Admits to cheating more than usual recently. She doesn't get regular exercise. She had diabetic eye exam in April, no retinopathy. She checks feet regularly and denies concerns  Lab Results  Component Value Date   HGBA1C 6.5 (A) 05/31/2020   Has vaginal itching frequently. Uses Terazol with good results, needing to use it about every 2 weeks.  Denies any vaginal discharge or internal itching.  Nonischemic cardiomyopathy: She reports compliance with lisinopril and carvedilol. She has been under the care of cardiologist, though not seen since televisit in 08/2019. She was due to see Dr. Oval Linsey in May, but appt cancelled when she moved locations.  She plans to stay in original location, since Dr. Oval Linsey moved. Denies side effects to medications. BP's are not checked at home, always fine when checked previously  BP Readings from Last 3 Encounters:  05/31/20 120/70  01/27/20 124/82  12/01/19 122/84    She denies headaches (occ migraines), dizziness, chest pain, edema. She has noticed some shortness of breath with going up and down the stairs x 2 months, unsure if related to her weight gain.  No issues when walking flat.  Mixed hyperlipidemia:  Patient reports compliance with atorvastatin, and denies side effects. TG were elevated on last check, was not repeated at her  CPE. LDL was at goal. On review of cardiology's 08/2019 visit, it was noted that she was put on Vascepa in 05/2018. It was noted that her lipids didn't improve, and she ended up stopping Vascepa. It also was noted that she had elevated LFT's on higher statin dose in the past. She is currently on 40m of atorvastatin. Last LFT"s normal in 05/2020.   Eats well when at home. +fast food 2x/week--burger, fries, diet soda or unsweet tea, occasional shake ([employer]).  Lab Results  Component Value Date   CHOL 161 01/27/2020   HDL 48 01/27/2020   LDLCALC 73 01/27/2020   LDLDIRECT 130.8 01/15/2014   TRIG 244 (H) 01/27/2020   CHOLHDL 3.4 01/27/2020    Vitamin D deficiency: Patient is currently taking 6,000 IU daily. Last level was normal at 31.8 in 05/2020, when taking 5000 IU.   PMH, PSH, SH reviewed  Outpatient Encounter Medications as of 12/01/2020  Medication Sig   ARIPiprazole (ABILIFY) 15 MG tablet Take 15 mg by mouth daily.   atorvastatin (LIPITOR) 10 MG tablet TAKE 1 TABLET BY MOUTH EVERY DAY   b complex vitamins tablet Take 1 tablet by mouth daily.   carvedilol (COREG) 25 MG tablet TAKE 1 TABLET BY MOUTH TWO  TIMES DAILY WITH A MEAL   cetirizine (ZYRTEC) 10 MG tablet Take 10 mg by mouth daily.     esomeprazole (NEXIUM) 20 MG capsule Take 20 mg by mouth daily at 12 noon.   Eszopiclone 3 MG TABS Take 3 mg by mouth at  bedtime as needed.   FLUoxetine (PROZAC) 40 MG capsule Take 1 capsule (40 mg total) by mouth daily. (Patient taking differently: Take 80 mg by mouth daily.)   furosemide (LASIX) 40 MG tablet Take 1 tablet (40 mg total) by mouth daily.   Glucosamine-Chondroitin 500-400 MG CAPS Take 1 tablet by mouth daily.    Glucose Blood (BLOOD GLUCOSE TEST STRIPS) STRP Test 1-2 times a day. Pt uses onetouch verio flex meter. Dx e11.9   glucose blood test strip Test once a day. Pt uses one touch verio meter   JARDIANCE 10 MG TABS tablet TAKE 1 TABLET BY MOUTH EVERY DAY   KLOR-CON M20 20 MEQ  tablet TAKE 1 TABLET BY MOUTH EVERY DAY   lamoTRIgine (LAMICTAL) 200 MG tablet Take 400 mg by mouth daily.   Lancets (ONETOUCH DELICA PLUS ZOXWRU04V) MISC Test 1-2 times a day. Pt uses one touch verio flex meter dx e11.9   lisinopril (ZESTRIL) 2.5 MG tablet TAKE 1 TABLET BY MOUTH EVERY DAY   metFORMIN (GLUCOPHAGE) 1000 MG tablet TAKE 1 TABLET TWICE DAILY WITH MEALS   [DISCONTINUED] ARIPiprazole (ABILIFY) 5 MG tablet Take 7.5 mg by mouth daily.    SUMAtriptan (IMITREX) 100 MG tablet TAKE 1 TABLET EVERY 2 HOURS AS NEEDED FOR MIGRAINE (NO MORE THAN 2 TABLETS IN A 24 HOUR PERIOD) MAY REPEAT IN 2 HOURS IF HEADACHE PERSISTS OR RECURS (Patient not taking: Reported on 12/01/2020)   terconazole (TERAZOL 3) 0.8 % vaginal cream USE 1 APPLICATOR VAGINALLY FOR 3 DAYS (Patient not taking: Reported on 12/01/2020)   Facility-Administered Encounter Medications as of 12/01/2020  Medication   levonorgestrel (MIRENA) 20 MCG/24HR IUD   Allergies  Allergen Reactions   Sulfonamide Derivatives Swelling    REACTION: swelling    ROS: no fever, chills, headaches, dizziness, URI symptoms, chest pain, edema, n/v/d, urinary complaints.  Slight DOE with stairs, no issues with other activity. No bleeding, bruising, rash.  Moods are good. No other concerns, see HPI. +weight gain, not as good with diet recently.   PHYSICAL EXAM:  BP 118/74   Pulse 84   Ht 5' 6.5" (1.689 m)   Wt 250 lb 6.4 oz (113.6 kg)   BMI 39.81 kg/m   Wt Readings from Last 3 Encounters:  12/01/20 250 lb 6.4 oz (113.6 kg)  05/31/20 242 lb 3.2 oz (109.9 kg)  01/27/20 253 lb 3.2 oz (114.9 kg)   Pleasant, obese female in no distress HEENT: conjunctiva and sclera are clear, EOMI, wearing mask Neck: No lymphadenopathy, thyromegaly or bruit Heart: regular rate and rhythm, no murmur Lungs: clear, no wheezing, rales, ronchi Back: no spinal or CVA tenderness Abdomen: soft, nontender, no mass Extremities: no edema Neuro: alert and oriented, normal  gait Psych: normal mood, affect  Lab Results  Component Value Date   HGBA1C 6.9 (A) 12/01/2020    ASSESSMENT/PLAN:  Controlled type 2 diabetes mellitus with complication, without long-term current use of insulin (HCC) - weight gain and A1c higher related to dietary indiscretions. counseled re: diet, exercise, goals of sugars - Plan: HgB A1c, empagliflozin (JARDIANCE) 10 MG TABS tablet, metFORMIN (GLUCOPHAGE) 1000 MG tablet, Hemoglobin A1c, Comprehensive metabolic panel  Mixed hyperlipidemia - reviewed lowfat, low cholesterol diet.  Will hold of on checking lipids, give her 3 mos to improve diet and recheck at f/u. Omega-3 fish oil rec - Plan: Lipid panel  Hyperlipidemia associated with type 2 diabetes mellitus (HCC)  Vitamin D deficiency  Chronic combined systolic and diastolic CHF (congestive heart failure) (  [place]) - stable on current regimen  Need for COVID-19 vaccine - Plan: PFIZER Comirnaty(GRAY TOP)COVID-19 Vaccine  Vaginal yeast infection - gets external itching, which she relates to medication. To use the cream externally since no vaginal discharge. - Plan: terconazole (TERAZOL 3) 0.8 % vaginal cream  Class 2 severe obesity due to excess calories with serious comorbidity and body mass index (BMI) of 39.0 to 39.9 in adult (HCC) - comorbidity--DM, CHF/cardiomyopathy, mood d/o, HLD. Counseled re: healthy diet, healthy fast food choices, regular exercise, weight loss  Medication monitoring encounter - Plan: Hemoglobin A1c, Lipid panel, Comprehensive metabolic panel  3 month OV with labs prior--A1c, c-met, lipids

## 2020-12-01 ENCOUNTER — Encounter: Payer: Self-pay | Admitting: Family Medicine

## 2020-12-01 ENCOUNTER — Ambulatory Visit: Payer: Managed Care, Other (non HMO) | Admitting: Family Medicine

## 2020-12-01 ENCOUNTER — Other Ambulatory Visit: Payer: Self-pay

## 2020-12-01 VITALS — BP 118/74 | HR 84 | Ht 66.5 in | Wt 250.4 lb

## 2020-12-01 DIAGNOSIS — Z6839 Body mass index (BMI) 39.0-39.9, adult: Secondary | ICD-10-CM

## 2020-12-01 DIAGNOSIS — Z23 Encounter for immunization: Secondary | ICD-10-CM

## 2020-12-01 DIAGNOSIS — E782 Mixed hyperlipidemia: Secondary | ICD-10-CM | POA: Diagnosis not present

## 2020-12-01 DIAGNOSIS — I5042 Chronic combined systolic (congestive) and diastolic (congestive) heart failure: Secondary | ICD-10-CM

## 2020-12-01 DIAGNOSIS — E785 Hyperlipidemia, unspecified: Secondary | ICD-10-CM

## 2020-12-01 DIAGNOSIS — E559 Vitamin D deficiency, unspecified: Secondary | ICD-10-CM | POA: Diagnosis not present

## 2020-12-01 DIAGNOSIS — E118 Type 2 diabetes mellitus with unspecified complications: Secondary | ICD-10-CM

## 2020-12-01 DIAGNOSIS — B3731 Acute candidiasis of vulva and vagina: Secondary | ICD-10-CM

## 2020-12-01 DIAGNOSIS — Z5181 Encounter for therapeutic drug level monitoring: Secondary | ICD-10-CM

## 2020-12-01 DIAGNOSIS — E1169 Type 2 diabetes mellitus with other specified complication: Secondary | ICD-10-CM | POA: Diagnosis not present

## 2020-12-01 DIAGNOSIS — B373 Candidiasis of vulva and vagina: Secondary | ICD-10-CM

## 2020-12-01 LAB — POCT GLYCOSYLATED HEMOGLOBIN (HGB A1C): Hemoglobin A1C: 6.9 % — AB (ref 4.0–5.6)

## 2020-12-01 MED ORDER — EMPAGLIFLOZIN 10 MG PO TABS: 10.0000 mg | ORAL_TABLET | Freq: Every day | ORAL | 5 refills | Status: DC

## 2020-12-01 MED ORDER — TERCONAZOLE 0.8 % VA CREA: TOPICAL_CREAM | VAGINAL | 3 refills | Status: DC

## 2020-12-01 MED ORDER — METFORMIN HCL 1000 MG PO TABS: 1000.0000 mg | ORAL_TABLET | Freq: Two times a day (BID) | ORAL | 1 refills | Status: DC

## 2020-12-01 NOTE — Patient Instructions (Addendum)
Please work on cutting back on the sweets in your diet.  Your sugars are up a little. Please try and get 30 minutes of exercise daily--this can be divided into 10-15 minute intervals, if needed. Improving your diet (sugar, fried foods, etc) will not only help with weight and sugar, but will keep the triglycerides lower. I'm going to give you 3 months of working on the diet, then will recheck the lipid panel and adjust medications if needed.  I recommend taking an over-the-counter omega-3 fish oil 3000 mg daily.  Please call to schedule your yearly appointment with the cardiologist.

## 2021-01-04 ENCOUNTER — Encounter: Payer: Self-pay | Admitting: Gastroenterology

## 2021-01-30 ENCOUNTER — Other Ambulatory Visit: Payer: Self-pay | Admitting: Cardiovascular Disease

## 2021-01-31 ENCOUNTER — Other Ambulatory Visit: Payer: Self-pay

## 2021-01-31 ENCOUNTER — Encounter: Payer: Self-pay | Admitting: Gastroenterology

## 2021-01-31 ENCOUNTER — Ambulatory Visit (AMBULATORY_SURGERY_CENTER): Payer: Managed Care, Other (non HMO) | Admitting: *Deleted

## 2021-01-31 VITALS — Ht 66.5 in | Wt 250.0 lb

## 2021-01-31 DIAGNOSIS — Z1211 Encounter for screening for malignant neoplasm of colon: Secondary | ICD-10-CM

## 2021-01-31 MED ORDER — PLENVU 140 G PO SOLR: 1.0000 | ORAL | 0 refills | Status: DC

## 2021-01-31 NOTE — Progress Notes (Signed)
Pt verified name, DOB, address and insurance during PV today.  Pt mailed instruction packet of Emmi video, copy of consent form to read and not return, and instructions.  Plenvu  coupon mailed in packet.  PV completed over the phone.  Pt encouraged to call with questions or issues.  My Chart instructions to pt as well    No egg or soy allergy known to patient  No issues known to pt with past sedation with any surgeries or procedures Patient denies ever being told they had issues or difficulty with intubation  No FH of Malignant Hyperthermia Pt is not on diet pills Pt is not on  home 02  Pt is not on blood thinners  Pt denies issues with constipation  No A fib or A flutter  EMMI video to pt or via [place] 19 guidelines implemented in PV today with Pt and RN   Pt is fully vaccinated  for Covid   Plenvu Coupon given to pt in PV today , Code to Pharmacy and  NO PA's for preps discussed with pt In PV today  Discussed with pt there will be an out-of-pocket cost for prep and that varies from $0 to 70 +  dollars   Due to the COVID-19 pandemic we are asking patients to follow certain guidelines.  Pt aware of COVID protocols and [facility] guidelines

## 2021-02-14 ENCOUNTER — Encounter: Payer: Self-pay | Admitting: Gastroenterology

## 2021-02-14 ENCOUNTER — Ambulatory Visit (AMBULATORY_SURGERY_CENTER): Payer: Managed Care, Other (non HMO) | Admitting: Gastroenterology

## 2021-02-14 ENCOUNTER — Other Ambulatory Visit: Payer: Self-pay

## 2021-02-14 VITALS — BP 104/67 | HR 90 | Temp 97.8°F | Resp 17 | Ht 66.5 in | Wt 250.0 lb

## 2021-02-14 DIAGNOSIS — K626 Ulcer of anus and rectum: Secondary | ICD-10-CM

## 2021-02-14 DIAGNOSIS — K529 Noninfective gastroenteritis and colitis, unspecified: Secondary | ICD-10-CM | POA: Diagnosis not present

## 2021-02-14 DIAGNOSIS — Z1211 Encounter for screening for malignant neoplasm of colon: Secondary | ICD-10-CM | POA: Diagnosis present

## 2021-02-14 DIAGNOSIS — K629 Disease of anus and rectum, unspecified: Secondary | ICD-10-CM

## 2021-02-14 MED ORDER — SODIUM CHLORIDE 0.9 % IV SOLN: 500.0000 mL | Freq: Once | INTRAVENOUS | Status: DC

## 2021-02-14 MED ORDER — HYDROCORTISONE ACETATE 25 MG RE SUPP: 25.0000 mg | Freq: Every day | RECTAL | 0 refills | Status: DC

## 2021-02-14 NOTE — Op Note (Signed)
[place] Patient Name: Carolyn Salazar Procedure Date: 02/14/2021 9:09 AM MRN: [id number] Endoscopist: Mauri Pole , MD Age: 50 Referring MD:  Date of Birth: 1970-08-14 Gender: Female Account #: [id number] Procedure:                Colonoscopy Indications:              Screening for colorectal malignant neoplasm Medicines:                Monitored Anesthesia Care Procedure:                Pre-Anesthesia Assessment:                           - Prior to the procedure, a History and Physical                            was performed, and patient medications and                            allergies were reviewed. The patient's tolerance of                            previous anesthesia was also reviewed. The risks                            and benefits of the procedure and the sedation                            options and risks were discussed with the patient.                            All questions were answered, and informed consent                            was obtained. Prior Anticoagulants: The patient has                            taken no previous anticoagulant or antiplatelet                            agents. ASA Grade Assessment: III - A patient with                            severe systemic disease. After reviewing the risks                            and benefits, the patient was deemed in                            satisfactory condition to undergo the procedure.                           After obtaining informed consent, the colonoscope  was passed under direct vision. Throughout the                            procedure, the patient's blood pressure, pulse, and                            oxygen saturations were monitored continuously. The                            Olympus [id number]-[id number] (#[id number]) Colonoscope was                            introduced through the anus and advanced to the the                            cecum,  identified by appendiceal orifice and                            ileocecal valve. The colonoscopy was performed                            without difficulty. The patient tolerated the                            procedure well. The quality of the bowel                            preparation was excellent. The ileocecal valve,                            appendiceal orifice, and rectum were photographed. Scope In: 4:54:09 AM Scope Out: 9:35:49 AM Scope Withdrawal Time: 0 hours 9 minutes 50 seconds  Total Procedure Duration: 0 hours 19 minutes 2 seconds  Findings:                 The perianal and digital rectal examinations were                            normal.                           Discontinuous areas of nonbleeding ulcerated mucosa                            with no stigmata of recent bleeding were present in                            the rectum. Biopsies were taken with a cold forceps                            for histology.                           The exam was otherwise without abnormality. Complications:            No  immediate complications. Estimated Blood Loss:     Estimated blood loss was minimal. Impression:               - Mucosal ulceration. Biopsied.                           - The examination was otherwise normal. Recommendation:           - Patient has a contact number available for                            emergencies. The signs and symptoms of potential                            delayed complications were discussed with the                            patient. Return to normal activities tomorrow.                            Written discharge instructions were provided to the                            patient.                           - Resume previous diet.                           - Continue present medications.                           - Await pathology results.                           - Repeat colonoscopy in 10 years for surveillance                             based on pathology results.                           - Use hydrocortisone suppository 25 mg 1 per rectum                            once a day at bedtime for 10 days. Mauri Pole, MD 02/14/2021 9:40:01 AM This report has been signed electronically.

## 2021-02-14 NOTE — Progress Notes (Signed)
Pt's states no medical or surgical changes since previsit or office visit. VS assessed by C.W

## 2021-02-14 NOTE — Progress Notes (Signed)
Called to room to assist during endoscopic procedure.  Patient ID and intended procedure confirmed with present staff. Received instructions for my participation in the procedure from the performing physician.  

## 2021-02-14 NOTE — Progress Notes (Signed)
A and O x3. Report to RN. Tolerated MAC anesthesia well. 

## 2021-02-14 NOTE — Progress Notes (Signed)
[facility] History and Physical   Primary Care Physician:  Girtha Rm, NP-C   Reason for Procedure:  Colorectal cancer screening  Plan:    Screening colonoscopy with possible interventions as needed     HPI: Carolyn Salazar is a very pleasant 50 y.o. female here for screening colonoscopy. Denies any nausea, vomiting, abdominal pain, melena or bright red blood per rectum  The risks and benefits as well as alternatives of endoscopic procedure(s) have been discussed and reviewed. All questions answered. The patient agrees to proceed.    Past Medical History:  Diagnosis Date   Allergy    Arthritis    Hands, Knees RT>LT   BENIGN NEOPLASM OF SKIN SITE UNSPECIFIED    benign mole   Blood transfusion without reported diagnosis    BUNIONS, BILATERAL    CARDIOMYOPATHY 02/1999   EF 20% in 02/1999, improved over time-  EF 40-45% at cath 2017   Chest pain    normal coronaries 2000 and 2017 (after an abnormal Myoview)   CHF (congestive heart failure) (HCC)    DEPRESSION    DIABETES MELLITUS, TYPE II, CONTROLLED, MILD    DYSLIPIDEMIA    GERD (gastroesophageal reflux disease)    Hypotension    November, 7619   METABOLIC SYNDROME X    hypertriglycerides 04/2008, hyperglycemia   MIGRAINE HEADACHE    NASH (nonalcoholic steatohepatitis)    OBESITY    Shingles (herpes zoster) polyneuropathy    Sinus tachycardia     Past Surgical History:  Procedure Laterality Date   BUNIONECTOMY  2012   LEFT   BUNIONECTOMY Right    CARDIAC CATHETERIZATION  2000   no CAD   CARDIAC CATHETERIZATION N/A 08/25/2015   Procedure: Left Heart Cath and Coronary Angiography;  Surgeon: Troy Sine, MD;  Location: [facility];  Service: Cardiovascular;  Laterality: N/A;   heart biopsy     mole removed     right arm age 67   vein scope      Prior to Admission medications   Medication Sig Start Date End Date Taking? Authorizing Provider  ARIPiprazole (ABILIFY) 15 MG tablet  Take 15 mg by mouth daily.   Yes [provider]  atorvastatin (LIPITOR) 10 MG tablet TAKE 1 TABLET BY MOUTH EVERY DAY 08/05/20  Yes Skeet Latch, MD  b complex vitamins tablet Take 1 tablet by mouth daily.   Yes [provider]  carvedilol (COREG) 25 MG tablet TAKE 1 TABLET BY MOUTH TWO  TIMES DAILY WITH A MEAL 02/09/20  Yes Skeet Latch, MD  cetirizine (ZYRTEC) 10 MG tablet Take 10 mg by mouth daily.     Yes [provider]  empagliflozin (JARDIANCE) 10 MG TABS tablet Take 1 tablet (10 mg total) by mouth daily. 12/01/20  Yes Rita Ohara, MD  esomeprazole (NEXIUM) 20 MG capsule Take 20 mg by mouth daily at 12 noon.   Yes [provider]  Eszopiclone 3 MG TABS Take 3 mg by mouth at bedtime as needed. 03/12/19  Yes [provider]  FLUoxetine (PROZAC) 40 MG capsule Take 1 capsule (40 mg total) by mouth daily. Patient taking differently: Take 80 mg by mouth daily. 01/15/14  Yes Rowe Clack, MD  furosemide (LASIX) 40 MG tablet TAKE 1 TABLET BY MOUTH EVERY DAY 02/01/21  Yes Skeet Latch, MD  Glucosamine-Chondroitin 500-400 MG CAPS Take 1 tablet by mouth daily.    Yes [provider]  Glucose Blood (BLOOD GLUCOSE TEST STRIPS) STRP Test  1-2 times a day. Pt uses onetouch verio flex meter. Dx e11.9 09/30/19  Yes Henson, Vickie L, NP-C  glucose blood test strip Test once a day. Pt uses one touch verio meter 12/06/17  Yes Henson, Vickie L, NP-C  KLOR-CON M20 20 MEQ tablet TAKE 1 TABLET BY MOUTH EVERY DAY 08/05/20  Yes Skeet Latch, MD  lamoTRIgine (LAMICTAL) 200 MG tablet Take 400 mg by mouth daily.   Yes [provider]  Lancets (ONETOUCH DELICA PLUS [id number]) MISC Test 1-2 times a day. Pt uses one touch verio flex meter dx e11.9 09/18/19  Yes Henson, Vickie L, NP-C  lisinopril (ZESTRIL) 2.5 MG tablet TAKE 1 TABLET BY MOUTH EVERY DAY 08/05/20  Yes Skeet Latch, MD  metFORMIN (GLUCOPHAGE) 1000 MG tablet Take 1 tablet (1,000  mg total) by mouth 2 (two) times daily with a meal. 12/01/20  Yes Rita Ohara, MD  terconazole (TERAZOL 3) 0.8 % vaginal cream USE 1 APPLICATOR VAGINALLY FOR 3 DAYS 12/01/20  Yes Rita Ohara, MD  SUMAtriptan (IMITREX) 100 MG tablet TAKE 1 TABLET EVERY 2 HOURS AS NEEDED FOR MIGRAINE (NO MORE THAN 2 TABLETS IN A 24 HOUR PERIOD) MAY REPEAT IN 2 HOURS IF HEADACHE PERSISTS OR RECURS Patient not taking: Reported on 02/14/2021 09/01/20   Girtha Rm, NP-C    Current Outpatient Medications  Medication Sig Dispense Refill   ARIPiprazole (ABILIFY) 15 MG tablet Take 15 mg by mouth daily.     atorvastatin (LIPITOR) 10 MG tablet TAKE 1 TABLET BY MOUTH EVERY DAY 90 tablet 3   b complex vitamins tablet Take 1 tablet by mouth daily.     carvedilol (COREG) 25 MG tablet TAKE 1 TABLET BY MOUTH TWO  TIMES DAILY WITH A MEAL 180 tablet 3   cetirizine (ZYRTEC) 10 MG tablet Take 10 mg by mouth daily.       empagliflozin (JARDIANCE) 10 MG TABS tablet Take 1 tablet (10 mg total) by mouth daily. 30 tablet 5   esomeprazole (NEXIUM) 20 MG capsule Take 20 mg by mouth daily at 12 noon.     Eszopiclone 3 MG TABS Take 3 mg by mouth at bedtime as needed.     FLUoxetine (PROZAC) 40 MG capsule Take 1 capsule (40 mg total) by mouth daily. (Patient taking differently: Take 80 mg by mouth daily.) 90 capsule 3   furosemide (LASIX) 40 MG tablet TAKE 1 TABLET BY MOUTH EVERY DAY 30 tablet 0   Glucosamine-Chondroitin 500-400 MG CAPS Take 1 tablet by mouth daily.      Glucose Blood (BLOOD GLUCOSE TEST STRIPS) STRP Test 1-2 times a day. Pt uses onetouch verio flex meter. Dx e11.9 200 strip 2   glucose blood test strip Test once a day. Pt uses one touch verio meter 100 each 3   KLOR-CON M20 20 MEQ tablet TAKE 1 TABLET BY MOUTH EVERY DAY 90 tablet 3   lamoTRIgine (LAMICTAL) 200 MG tablet Take 400 mg by mouth daily.     Lancets (ONETOUCH DELICA PLUS [id number]) MISC Test 1-2 times a day. Pt uses one touch verio flex meter dx e11.9 200 each 1    lisinopril (ZESTRIL) 2.5 MG tablet TAKE 1 TABLET BY MOUTH EVERY DAY 90 tablet 3   metFORMIN (GLUCOPHAGE) 1000 MG tablet Take 1 tablet (1,000 mg total) by mouth 2 (two) times daily with a meal. 180 tablet 1   terconazole (TERAZOL 3) 0.8 % vaginal cream USE 1 APPLICATOR VAGINALLY FOR 3 DAYS 20 g 3   SUMAtriptan (  IMITREX) 100 MG tablet TAKE 1 TABLET EVERY 2 HOURS AS NEEDED FOR MIGRAINE (NO MORE THAN 2 TABLETS IN A 24 HOUR PERIOD) MAY REPEAT IN 2 HOURS IF HEADACHE PERSISTS OR RECURS (Patient not taking: Reported on 02/14/2021) 9 tablet 0   Current Facility-Administered Medications  Medication Dose Route Frequency Provider Last Rate Last Admin   0.9 %  sodium chloride infusion  500 mL Intravenous Once Dantre Yearwood, Venia Minks, MD       levonorgestrel (MIRENA) 20 MCG/24HR IUD   Intrauterine Once Terrance Mass, MD        Allergies as of 02/14/2021 - Review Complete 02/14/2021  Allergen Reaction Noted   Sulfonamide derivatives Swelling     Family History  Problem Relation Age of Onset   Diabetes Mother    Cancer Mother        Ovarian, benign mass   Diabetes type II Mother    Hyperlipidemia Mother    Diabetes Father    Heart failure Father    Heart attack Father    Arthritis Sister    Hypertension Other        Parent   Hyperlipidemia Other        parent   Arthritis Other        parent, grandparent   Colon cancer Neg Hx    Colon polyps Neg Hx    Esophageal cancer Neg Hx    Rectal cancer Neg Hx    Stomach cancer Neg Hx     Social History   Socioeconomic History   Marital status: Married    Spouse name: Not on file   Number of children: Not on file   Years of education: Not on file   Highest education level: Not on file  Occupational History   Not on file  Tobacco Use   Smoking status: Never   Smokeless tobacco: Never  Vaping Use   Vaping Use: Never used  Substance and Sexual Activity   Alcohol use: Not Currently    Alcohol/week: 4.0 standard drinks    Types: 4 Glasses  of wine per week    Comment: 1 glass of wine every other week.    Drug use: No   Sexual activity: Not Currently    Birth control/protection: Other-see comments    Comment: TUBAL LIGATION, 1st intercourse- 20, partners- 3  Other Topics Concern   Not on file  Social History Narrative   Pt lives with her spouse, daughter and college friend.       Epworth Sleepiness Scale = 10 (as of 07/21/2015)   Social Determinants of Health   Financial Resource Strain: Not on file  Food Insecurity: Not on file  Transportation Needs: Not on file  Physical Activity: Not on file  Stress: Not on file  Social Connections: Not on file  Intimate Partner Violence: Not on file    Review of Systems:  All other review of systems negative except as mentioned in the HPI.  Physical Exam: Vital signs in last 24 hours: BP (!) 159/97   Pulse (!) 114   Temp 97.8 F (36.6 C) (Skin)   Ht 5' 6.5" (1.689 m)   Wt 250 lb (113.4 kg)   SpO2 96%   BMI 39.75 kg/m     General:   Alert, NAD Lungs:  Clear .   Heart:  Regular rate and rhythm Abdomen:  Soft, nontender and nondistended. Neuro/Psych:  Alert and cooperative. Normal mood and affect. A and O x 3  Reviewed labs,  radiology imaging, old records and pertinent past GI work up  Patient is appropriate for planned procedure(s) and anesthesia in an ambulatory setting   K. Denzil Magnuson , MD [phone]

## 2021-02-14 NOTE — Patient Instructions (Signed)
Please read handouts provided. Continue present medications. Await pathology results. Use hydrocortisone suppository 25 mg 1 per rectum daily at bedtime for 10 days. Repeat colonoscopy in 10 years for screening.   YOU HAD AN ENDOSCOPIC PROCEDURE TODAY AT [facility]:   Refer to the procedure report that was given to you for any specific questions about what was found during the examination.  If the procedure report does not answer your questions, please call your gastroenterologist to clarify.  If you requested that your care partner not be given the details of your procedure findings, then the procedure report has been included in a sealed envelope for you to review at your convenience later.  YOU SHOULD EXPECT: Some feelings of bloating in the abdomen. Passage of more gas than usual.  Walking can help get rid of the air that was put into your GI tract during the procedure and reduce the bloating. If you had a lower endoscopy (such as a colonoscopy or flexible sigmoidoscopy) you may notice spotting of blood in your stool or on the toilet paper. If you underwent a bowel prep for your procedure, you may not have a normal bowel movement for a few days.  Please Note:  You might notice some irritation and congestion in your nose or some drainage.  This is from the oxygen used during your procedure.  There is no need for concern and it should clear up in a day or so.  SYMPTOMS TO REPORT IMMEDIATELY:  Following lower endoscopy (colonoscopy or flexible sigmoidoscopy):  Excessive amounts of blood in the stool  Significant tenderness or worsening of abdominal pains  Swelling of the abdomen that is new, acute  Fever of 100F or higher   For urgent or emergent issues, a gastroenterologist can be reached at any hour by calling [phone]. Do not use MyChart messaging for urgent concerns.    DIET:  We do recommend a small meal at first, but then you may proceed to your regular diet.   Drink plenty of fluids but you should avoid alcoholic beverages for 24 hours.  ACTIVITY:  You should plan to take it easy for the rest of today and you should NOT DRIVE or use heavy machinery until tomorrow (because of the sedation medicines used during the test).    FOLLOW UP: Our staff will call the number listed on your records 48-72 hours following your procedure to check on you and address any questions or concerns that you may have regarding the information given to you following your procedure. If we do not reach you, we will leave a message.  We will attempt to reach you two times.  During this call, we will ask if you have developed any symptoms of COVID 19. If you develop any symptoms (ie: fever, flu-like symptoms, shortness of breath, cough etc.) before then, please call [phone].  If you test positive for Covid 19 in the 2 weeks post procedure, please call and report this information to [place].    If any biopsies were taken you will be contacted by phone or by letter within the next 1-3 weeks.  Please call us at [phone] if you have not heard about the biopsies in 3 weeks.    SIGNATURES/CONFIDENTIALITY: You and/or your care partner have signed paperwork which will be entered into your electronic medical record.  These signatures attest to the fact that that the information above on your After Visit Summary has been reviewed and is understood.  Full  responsibility of the confidentiality of this discharge information lies with you and/or your care-partner.

## 2021-02-16 ENCOUNTER — Telehealth: Payer: Self-pay

## 2021-02-16 NOTE — Telephone Encounter (Signed)
  Follow up Call-  Call back number 02/14/2021  Post procedure Call Back phone  # [phone]  Permission to leave phone message Yes  Some recent data might be hidden     Patient questions:  Do you have a fever, pain , or abdominal swelling? No. Pain Score  0 *  Have you tolerated food without any problems? Yes.    Have you been able to return to your normal activities? Yes.    Do you have any questions about your discharge instructions: Diet   No. Medications  No. Follow up visit  No.  Do you have questions or concerns about your Care? No.  Actions: * If pain score is 4 or above: No action needed, pain <4. Have you developed a fever since your procedure? no  2.   Have you had an respiratory symptoms (SOB or cough) since your procedure? no  3.   Have you tested positive for COVID 19 since your procedure no  4.   Have you had any family members/close contacts diagnosed with the COVID 19 since your procedure?  no   If yes to any of these questions please route to Joylene John, RN and Joella Prince, RN

## 2021-02-22 ENCOUNTER — Encounter: Payer: Self-pay | Admitting: Gastroenterology

## 2021-02-25 ENCOUNTER — Other Ambulatory Visit: Payer: Self-pay | Admitting: Cardiovascular Disease

## 2021-02-25 NOTE — Telephone Encounter (Signed)
Rx(s) sent to pharmacy electronically.  

## 2021-02-27 ENCOUNTER — Other Ambulatory Visit: Payer: Self-pay | Admitting: Cardiovascular Disease

## 2021-02-28 ENCOUNTER — Other Ambulatory Visit: Payer: Managed Care, Other (non HMO)

## 2021-02-28 NOTE — Telephone Encounter (Signed)
Rx(s) sent to pharmacy electronically.  

## 2021-03-01 ENCOUNTER — Other Ambulatory Visit: Payer: Managed Care, Other (non HMO)

## 2021-03-01 ENCOUNTER — Other Ambulatory Visit: Payer: Self-pay

## 2021-03-01 DIAGNOSIS — E782 Mixed hyperlipidemia: Secondary | ICD-10-CM

## 2021-03-01 DIAGNOSIS — Z5181 Encounter for therapeutic drug level monitoring: Secondary | ICD-10-CM

## 2021-03-01 DIAGNOSIS — E118 Type 2 diabetes mellitus with unspecified complications: Secondary | ICD-10-CM

## 2021-03-01 LAB — COMPREHENSIVE METABOLIC PANEL
ALT: 22 IU/L (ref 0–32)
AST: 17 IU/L (ref 0–40)
Albumin/Globulin Ratio: 1.9 (ref 1.2–2.2)
Albumin: 4.2 g/dL (ref 3.8–4.8)
Alkaline Phosphatase: 91 IU/L (ref 44–121)
BUN/Creatinine Ratio: 16 (ref 9–23)
BUN: 12 mg/dL (ref 6–24)
Bilirubin Total: 0.6 mg/dL (ref 0.0–1.2)
CO2: 24 mmol/L (ref 20–29)
Calcium: 9.9 mg/dL (ref 8.7–10.2)
Chloride: 103 mmol/L (ref 96–106)
Creatinine, Ser: 0.76 mg/dL (ref 0.57–1.00)
Globulin, Total: 2.2 g/dL (ref 1.5–4.5)
Glucose: 161 mg/dL — ABNORMAL HIGH (ref 70–99)
Potassium: 4.1 mmol/L (ref 3.5–5.2)
Sodium: 140 mmol/L (ref 134–144)
Total Protein: 6.4 g/dL (ref 6.0–8.5)
eGFR: 96 mL/min/{1.73_m2} (ref >59)

## 2021-03-01 LAB — LIPID PANEL
Chol/HDL Ratio: 3.5 ratio (ref 0.0–4.4)
Cholesterol, Total: 141 mg/dL (ref 100–199)
HDL: 40 mg/dL (ref >39)
LDL Chol Calc (NIH): 63 mg/dL (ref 0–99)
Triglycerides: 235 mg/dL — ABNORMAL HIGH (ref 0–149)
VLDL Cholesterol Cal: 38 mg/dL (ref 5–40)

## 2021-03-01 LAB — HEMOGLOBIN A1C
Est. average glucose Bld gHb Est-mCnc: 151 mg/dL
Hgb A1c MFr Bld: 6.9 % — ABNORMAL HIGH (ref 4.8–5.6)

## 2021-03-03 ENCOUNTER — Encounter: Payer: Managed Care, Other (non HMO) | Admitting: Family Medicine

## 2021-03-06 NOTE — Progress Notes (Signed)
Chief Complaint  Patient presents with   Diabetes    Nonfasting med check, no new concerns. Was on Vascepa and states that levels went up, but is willing to retry. Did take 3010m of OTC Aug and Sept and ran out.     Patient presents for 3 month follow-up on chronic problems.  See below for labs done prior to today's visit.  Diabetes:  Patient reports compliance with metformin and jardiance, and denies side effects. She does not check blood sugars. She tries to limit sugar in her diet. At her visit in July she reported  cheating more than usual, and not getting regular exercise. She reports that her diet has improved.  She has been swimming 3x/week. Her A1c had increased from 6.5% in 05/2020 to 6.9%. She is surprised that A1c didn't improve over the last 3 mos. She had diabetic eye exam in April, no retinopathy. She checks feet regularly and denies concerns    Nonischemic cardiomyopathy: She reports compliance with lisinopril and carvedilol. She has been under the care of cardiologist, though not seen since televisit in 08/2019. She was due to see Dr. ROval Linseyin May, but appt cancelled when she moved locations. She has not scheduled appointment. Dr. ROval Linseyrefilled her medication earlier this month, but only for a month supply, needs to schedule f/u.  Denies side effects to medications.  She denies headaches (occ migraines), dizziness, edema. At her July visit she reported noticing some shortness of breath with going up and down the stairs x 2 months, unsure if related to her weight gain.  No issues when walking flat. Now she reports some chest pressure with going up and down the stairs, winded with swimming, but no chest pain. (Uses kickboard, doesn't use her arms, as directed by MD; and does silver sneakers water aerobics).  BP's are not checked at home, always fine when checked previously BP Readings from Last 3 Encounters:  02/14/21 104/67  12/01/20 118/74  05/31/20 120/70     Mixed  hyperlipidemia:  Patient reports compliance with atorvastatin, and denies side effects. TG were elevated in 01/2020. LDL was at goal. On review of cardiology's 08/2019 visit, it was noted that she was put on Vascepa in 05/2018. It was noted that her lipids didn't improve, and she ended up stopping Vascepa. It also was noted that she had elevated LFT's on higher statin dose in the past. She is currently on 114mof atorvastatin. LDL in 01/2020 was 244, LDL was 73.  She was encouraged to work on diet and exercise, and presents for her 3 month follow-up.  She had been advised to take fish oil 300089maily, and to cut back on sweets and fried foods in her diet (at last visit she reported eating fast food 2x/week--burger, fries, diet soda or unsweet tea, occasional shake ([employer]); eats well when home). Today she reports that she stopped milkshakes.  Eating grilled chicken when eating out. Trying to eat home more. She took 3000m39m omega-3 fish oil for Aug/Sept, ran out and forgot to get more.  She didn't have any side effects.     PMH, PSH, SH reviewed  Outpatient Encounter Medications as of 03/07/2021  Medication Sig   ARIPiprazole (ABILIFY) 15 MG tablet Take 15 mg by mouth daily.   atorvastatin (LIPITOR) 10 MG tablet TAKE 1 TABLET BY MOUTH EVERY DAY   b complex vitamins tablet Take 1 tablet by mouth daily.   carvedilol (COREG) 25 MG tablet Take 1 tablet (25 mg  total) by mouth 2 (two) times daily with a meal. NEED APPOINTMENT   cetirizine (ZYRTEC) 10 MG tablet Take 10 mg by mouth daily.     empagliflozin (JARDIANCE) 10 MG TABS tablet Take 1 tablet (10 mg total) by mouth daily.   esomeprazole (NEXIUM) 20 MG capsule Take 20 mg by mouth daily at 12 noon.   Eszopiclone 3 MG TABS Take 3 mg by mouth at bedtime as needed.   FLUoxetine (PROZAC) 40 MG capsule Take 40 mg by mouth daily.   furosemide (LASIX) 40 MG tablet Take 1 tablet (40 mg total) by mouth daily. Need appointment   Glucosamine-Chondroitin  500-400 MG CAPS Take 1 tablet by mouth daily.    Glucose Blood (BLOOD GLUCOSE TEST STRIPS) STRP Test 1-2 times a day. Pt uses onetouch verio flex meter. Dx e11.9   glucose blood test strip Test once a day. Pt uses one touch verio meter   KLOR-CON M20 20 MEQ tablet TAKE 1 TABLET BY MOUTH EVERY DAY   lamoTRIgine (LAMICTAL) 200 MG tablet Take 400 mg by mouth daily.   Lancets (ONETOUCH DELICA PLUS [id number]) MISC Test 1-2 times a day. Pt uses one touch verio flex meter dx e11.9   lisinopril (ZESTRIL) 2.5 MG tablet TAKE 1 TABLET BY MOUTH EVERY DAY   metFORMIN (GLUCOPHAGE) 1000 MG tablet Take 1 tablet (1,000 mg total) by mouth 2 (two) times daily with a meal.   hydrocortisone (ANUSOL-HC) 25 MG suppository Place 1 suppository (25 mg total) rectally daily. One suppository (25 mg) per rectum daily at bedtime for 10 days. (Patient not taking: Reported on 03/07/2021)   SUMAtriptan (IMITREX) 100 MG tablet TAKE 1 TABLET EVERY 2 HOURS AS NEEDED FOR MIGRAINE (NO MORE THAN 2 TABLETS IN A 24 HOUR PERIOD) MAY REPEAT IN 2 HOURS IF HEADACHE PERSISTS OR RECURS (Patient not taking: No sig reported)   terconazole (TERAZOL 3) 0.8 % vaginal cream USE 1 APPLICATOR VAGINALLY FOR 3 DAYS (Patient not taking: Reported on 03/07/2021)   [DISCONTINUED] FLUoxetine (PROZAC) 40 MG capsule Take 1 capsule (40 mg total) by mouth daily. (Patient taking differently: Take 80 mg by mouth daily.)   Facility-Administered Encounter Medications as of 03/07/2021  Medication Note   levonorgestrel (MIRENA) 20 MCG/24HR IUD 02/14/2021: 02/14/21   Allergies  Allergen Reactions   Sulfonamide Derivatives Swelling    REACTION: swelling    ROS: no fever, chills, headaches, dizziness, URI symptoms, edema, n/v/d, urinary complaints.   Slight DOE with stairs, now also with some mild chest pain, some shortness of breath with swimming. No bleeding, bruising, rash.  Moods are good.   PHYSICAL EXAM:  BP 108/74   Pulse 92   Ht 5' 6"  (1.676 m)   Wt  247 lb (112 kg)   BMI 39.87 kg/m   Wt Readings from Last 3 Encounters:  03/07/21 247 lb (112 kg)  02/14/21 250 lb (113.4 kg)  01/31/21 250 lb (113.4 kg)   Pleasant, obese female in no distress HEENT: conjunctiva and sclera are clear, EOMI, wearing mask Neck: No lymphadenopathy, thyromegaly or bruit Heart: regular rate and rhythm, no murmur Lungs: clear, no wheezing, rales, ronchi Back: no spinal or CVA tenderness Abdomen: soft, nontender, no mass Extremities: no edema Neuro: alert and oriented, normal gait Psych: normal mood, affect  Fasting glucose 161  Lab Results  Component Value Date   HGBA1C 6.9 (H) 03/01/2021     Chemistry      Component Value Date/Time   NA 140 03/01/2021 0939  K 4.1 03/01/2021 0939   CL 103 03/01/2021 0939   CO2 24 03/01/2021 0939   BUN 12 03/01/2021 0939   CREATININE 0.76 03/01/2021 0939   CREATININE 0.76 08/18/2015 1614      Component Value Date/Time   CALCIUM 9.9 03/01/2021 0939   ALKPHOS 91 03/01/2021 0939   AST 17 03/01/2021 0939   ALT 22 03/01/2021 0939   BILITOT 0.6 03/01/2021 0939     Lab Results  Component Value Date   CHOL 141 03/01/2021   HDL 40 03/01/2021   LDLCALC 63 03/01/2021   LDLDIRECT 130.8 01/15/2014   TRIG 235 (H) 03/01/2021   CHOLHDL 3.5 03/01/2021    ASSESSMENT/PLAN:  Controlled type 2 diabetes mellitus with complication, without long-term current use of insulin (HCC) - down 3#, improved diet/exercise, A1c unchanged. Cont current meds. Rec monitoring to help with diet--Freestyle Libre 3 sample given  Hyperlipidemia associated with type 2 diabetes mellitus (HCC) - LDL at goal, TG elevated.  Reviewed proper diet, restart omega-3. Recheck in 3 mos  Mixed hyperlipidemia - LDL at goal, TG elevated.  Reviewed proper diet, restart omega-3. Recheck in 3 mos  Chronic combined systolic and diastolic CHF (congestive heart failure) ([place]) - needs to schedule f/u with cardiologist. Cont current meds  Need for influenza  vaccination - Plan: Flu Vaccine QUAD 6+ mos PF IM (Fluarix Quad PF)  Need for COVID-19 vaccine - Plan: Pension scheme manager  Dr. Oval Linsey refilled meds x 30d only earlier this month, now having some CP and DOE; reminded her that she needs to schedule f/u with cardiologist.  F/u 3 months (CPE if available, otherwise med check) with labs prior A1c, lipid, glu, TSH, urine microalb, Vit d, CBC  Discussed how regular monitoring can help her determine which foods are driving up her sugar (like her fasting glucose of 161).  She states that her daughter has [employer], but costs$200 for 90d supply, which isn't affordable for her to do. This was without any coupon/program. She was given a sample (of the 3), and advised of the number to contact for discount/savings. She wants to verify cost before we send rx in to the pharmacy.  Counseled re: diet, exercise, medications. Will restart omega-3 fish oil.  If TG not improved, can change back to [place] and/or get cardiologist's input.

## 2021-03-07 ENCOUNTER — Ambulatory Visit: Payer: Managed Care, Other (non HMO) | Admitting: Family Medicine

## 2021-03-07 ENCOUNTER — Other Ambulatory Visit: Payer: Self-pay

## 2021-03-07 ENCOUNTER — Encounter: Payer: Self-pay | Admitting: Family Medicine

## 2021-03-07 ENCOUNTER — Encounter: Payer: Managed Care, Other (non HMO) | Admitting: Family Medicine

## 2021-03-07 VITALS — BP 108/74 | HR 92 | Ht 66.0 in | Wt 247.0 lb

## 2021-03-07 DIAGNOSIS — I5042 Chronic combined systolic (congestive) and diastolic (congestive) heart failure: Secondary | ICD-10-CM

## 2021-03-07 DIAGNOSIS — Z23 Encounter for immunization: Secondary | ICD-10-CM

## 2021-03-07 DIAGNOSIS — Z5181 Encounter for therapeutic drug level monitoring: Secondary | ICD-10-CM

## 2021-03-07 DIAGNOSIS — E559 Vitamin D deficiency, unspecified: Secondary | ICD-10-CM

## 2021-03-07 DIAGNOSIS — E785 Hyperlipidemia, unspecified: Secondary | ICD-10-CM

## 2021-03-07 DIAGNOSIS — E1169 Type 2 diabetes mellitus with other specified complication: Secondary | ICD-10-CM | POA: Diagnosis not present

## 2021-03-07 DIAGNOSIS — E782 Mixed hyperlipidemia: Secondary | ICD-10-CM

## 2021-03-07 DIAGNOSIS — E118 Type 2 diabetes mellitus with unspecified complications: Secondary | ICD-10-CM

## 2021-03-07 NOTE — Patient Instructions (Addendum)
Restart fish oil at 3000-4000 mg daily, along with your statin and lowfat diet. Your triglycerides were above goal, but your sugar was pretty high that day also (which can contribute to triglycerides) Continue to try and eat a lowfat diet. Try the [facility] monitor for the 2 week trial.  If you check the coupon and with your insurance and it is affordable, let us know and we can send in a prescription to your pharmacy.  Make the most of the 2 weeks in trying to learn which foods are driving up your sugars, and to limit those and find alternatives.  Please schedule your appointment with the cardiologist.

## 2021-03-09 ENCOUNTER — Telehealth: Payer: Self-pay | Admitting: *Deleted

## 2021-03-09 NOTE — Telephone Encounter (Signed)
Left message for patient that I am putting new one upfront for her.

## 2021-03-09 NOTE — Telephone Encounter (Signed)
ok 

## 2021-03-09 NOTE — Telephone Encounter (Signed)
Patient called and her Freestyle came off this morning in the shower. I did recommend that she go buy Tegaderm when she was here-she said she was going to get it today. She asked of she could come by and get another sample.

## 2021-03-10 ENCOUNTER — Other Ambulatory Visit: Payer: Self-pay | Admitting: Cardiovascular Disease

## 2021-03-10 NOTE — Telephone Encounter (Signed)
Rx request sent to pharmacy.  

## 2021-03-25 ENCOUNTER — Other Ambulatory Visit: Payer: Self-pay | Admitting: Cardiovascular Disease

## 2021-03-25 ENCOUNTER — Encounter: Payer: Self-pay | Admitting: Family Medicine

## 2021-03-25 NOTE — Telephone Encounter (Signed)
RX request sent to pharmacy

## 2021-03-28 ENCOUNTER — Other Ambulatory Visit: Payer: Self-pay | Admitting: *Deleted

## 2021-03-28 MED ORDER — FREESTYLE LIBRE 3 SENSOR MISC: 1.0000 | 0 refills | Status: DC

## 2021-04-04 ENCOUNTER — Ambulatory Visit (INDEPENDENT_AMBULATORY_CARE_PROVIDER_SITE_OTHER): Payer: Managed Care, Other (non HMO) | Admitting: Cardiology

## 2021-04-04 ENCOUNTER — Other Ambulatory Visit: Payer: Self-pay

## 2021-04-04 ENCOUNTER — Encounter: Payer: Self-pay | Admitting: Cardiology

## 2021-04-04 VITALS — BP 100/78 | HR 102 | Ht 66.0 in | Wt 243.2 lb

## 2021-04-04 DIAGNOSIS — I428 Other cardiomyopathies: Secondary | ICD-10-CM

## 2021-04-04 DIAGNOSIS — R5383 Other fatigue: Secondary | ICD-10-CM | POA: Diagnosis not present

## 2021-04-04 DIAGNOSIS — R0602 Shortness of breath: Secondary | ICD-10-CM

## 2021-04-04 DIAGNOSIS — E559 Vitamin D deficiency, unspecified: Secondary | ICD-10-CM

## 2021-04-04 DIAGNOSIS — R400 Somnolence: Secondary | ICD-10-CM

## 2021-04-04 MED ORDER — FUROSEMIDE 40 MG PO TABS: 40.0000 mg | ORAL_TABLET | Freq: Every day | ORAL | 3 refills | Status: DC

## 2021-04-04 NOTE — Progress Notes (Signed)
Cardiology Office Note:    Date:  04/05/2021   ID:  Carolyn Salazar, DOB [birth year], MRN [id number]  PCP:  Rita Ohara, MD  Cardiologist:  Skeet Latch, MD  Electrophysiologist:  None   Referring MD: Girtha Rm, PA-C   " I have been short of breath"   History of Present Illness:    Carolyn Salazar is a 50 y.o. female with a hx of nonischemic cardiomyopathy which based on chart review going back to 2000 she had a ejection fraction of 20%, Catheterization was done then when she lived in [place], revealed normal coronaries.  In 2017 she had some chest pain.  Nuclear stress test was abnormal.  Diagnostic catheterization in March 2017 showed no significant coronary disease.  Her ejection fraction then was 45 to 50%, diabetes mellitus on metformin and Jardiance, hypertension, GERD.  She was last seen in our practice on August 21, 2019 via telemedicine by Kerin Ransom, PA, at that time no medication changes were made.  She was asked to follow-up with in a year.  She had not been able to make her visit and has requested to be seen recently due to experiencing experiencing significant shortness of breath on exertion.  She notes that she also has daytime somnolence and has had some problem with fatigue  Past Medical History:  Diagnosis Date   Allergy    Arthritis    Hands, Knees RT>LT   BENIGN NEOPLASM OF SKIN SITE UNSPECIFIED    benign mole   Blood transfusion without reported diagnosis    BUNIONS, BILATERAL    CARDIOMYOPATHY 02/1999   EF 20% in 02/1999, improved over time-  EF 40-45% at cath 2017   Chest pain    normal coronaries 2000 and 2017 (after an abnormal Myoview)   CHF (congestive heart failure) (HCC)    DEPRESSION    DIABETES MELLITUS, TYPE II, CONTROLLED, MILD    DYSLIPIDEMIA    GERD (gastroesophageal reflux disease)    Hypotension    November, 2671   METABOLIC SYNDROME X    hypertriglycerides 04/2008, hyperglycemia   MIGRAINE HEADACHE    NASH (nonalcoholic  steatohepatitis)    OBESITY    Shingles (herpes zoster) polyneuropathy    Sinus tachycardia     Past Surgical History:  Procedure Laterality Date   BUNIONECTOMY  2012   LEFT   BUNIONECTOMY Right    CARDIAC CATHETERIZATION  2000   no CAD   CARDIAC CATHETERIZATION N/A 08/25/2015   Procedure: Left Heart Cath and Coronary Angiography;  Surgeon: Troy Sine, MD;  Location: [facility];  Service: Cardiovascular;  Laterality: N/A;   heart biopsy     mole removed     right arm age 67   vein scope      Current Medications: Current Meds  Medication Sig   ARIPiprazole (ABILIFY) 15 MG tablet Take 15 mg by mouth daily.   atorvastatin (LIPITOR) 10 MG tablet TAKE 1 TABLET BY MOUTH EVERY DAY   b complex vitamins tablet Take 1 tablet by mouth daily.   carvedilol (COREG) 25 MG tablet Take 1 tablet (25 mg total) by mouth 2 (two) times daily with a meal. Keep follow up appointment for future refills.   cetirizine (ZYRTEC) 10 MG tablet Take 10 mg by mouth daily.     Continuous Blood Gluc Sensor (FREESTYLE LIBRE 3 SENSOR) MISC 1 each by Does not apply route every 14 (fourteen) days.   empagliflozin (JARDIANCE) 10 MG TABS tablet Take 1 tablet (10  mg total) by mouth daily.   esomeprazole (NEXIUM) 20 MG capsule Take 20 mg by mouth daily at 12 noon.   Eszopiclone 3 MG TABS Take 3 mg by mouth at bedtime as needed.   FLUoxetine (PROZAC) 40 MG capsule Take 40 mg by mouth daily.   Glucosamine-Chondroitin 500-400 MG CAPS Take 1 tablet by mouth daily.    Glucose Blood (BLOOD GLUCOSE TEST STRIPS) STRP Test 1-2 times a day. Pt uses onetouch verio flex meter. Dx e11.9   glucose blood test strip Test once a day. Pt uses one touch verio meter   KLOR-CON M20 20 MEQ tablet TAKE 1 TABLET BY MOUTH EVERY DAY   lamoTRIgine (LAMICTAL) 200 MG tablet Take 400 mg by mouth daily.   Lancets (ONETOUCH DELICA PLUS VPXTGG26R) MISC Test 1-2 times a day. Pt uses one touch verio flex meter dx e11.9   lisinopril (ZESTRIL)  2.5 MG tablet TAKE 1 TABLET BY MOUTH EVERY DAY   metFORMIN (GLUCOPHAGE) 1000 MG tablet Take 1 tablet (1,000 mg total) by mouth 2 (two) times daily with a meal.   Omega-3 Fatty Acids (FISH OIL) 1000 MG CAPS Take 3,000 mg by mouth.   SUMAtriptan (IMITREX) 100 MG tablet TAKE 1 TABLET EVERY 2 HOURS AS NEEDED FOR MIGRAINE (NO MORE THAN 2 TABLETS IN A 24 HOUR PERIOD) MAY REPEAT IN 2 HOURS IF HEADACHE PERSISTS OR RECURS   terconazole (TERAZOL 3) 0.8 % vaginal cream USE 1 APPLICATOR VAGINALLY FOR 3 DAYS (Patient taking differently: As needed USE 1 APPLICATOR VAGINALLY FOR 3 DAYS)   [DISCONTINUED] furosemide (LASIX) 40 MG tablet Take 1 tablet (40 mg total) by mouth daily. Please schedule appointment with Dr. Oval Linsey or APP for refills.   Current Facility-Administered Medications for the 04/04/21 encounter (Office Visit) with Berniece Salines, DO  Medication   levonorgestrel (MIRENA) 20 MCG/24HR IUD     Allergies:   Sulfonamide derivatives   Social History   Socioeconomic History   Marital status: Married    Spouse name: Not on file   Number of children: Not on file   Years of education: Not on file   Highest education level: Not on file  Occupational History   Not on file  Tobacco Use   Smoking status: Never   Smokeless tobacco: Never  Vaping Use   Vaping Use: Never used  Substance and Sexual Activity   Alcohol use: Not Currently    Alcohol/week: 4.0 standard drinks    Types: 4 Glasses of wine per week    Comment: 1 glass of wine every other week.    Drug use: No   Sexual activity: Not Currently    Birth control/protection: Other-see comments    Comment: TUBAL LIGATION, 1st intercourse- 20, partners- 3  Other Topics Concern   Not on file  Social History Narrative   Pt lives with her spouse, daughter and college friend.       Epworth Sleepiness Scale = 10 (as of 07/21/2015)   Social Determinants of Health   Financial Resource Strain: Not on file  Food Insecurity: Not on file   Transportation Needs: Not on file  Physical Activity: Not on file  Stress: Not on file  Social Connections: Not on file     Family History: The patient's family history includes Arthritis in her sister and another family member; Cancer in her mother; Diabetes in her father and mother; Diabetes type II in her mother; Heart attack in her father; Heart failure in her father; Hyperlipidemia in her  mother and another family member; Hypertension in an other family member. There is no history of Colon cancer, Colon polyps, Esophageal cancer, Rectal cancer, or Stomach cancer.  ROS:   Review of Systems  Constitution: Negative for decreased appetite, fever and weight gain.  HENT: Negative for congestion, ear discharge, hoarse voice and sore throat.   Eyes: Negative for discharge, redness, vision loss in right eye and visual halos.  Cardiovascular: Negative for chest pain, dyspnea on exertion, leg swelling, orthopnea and palpitations.  Respiratory: Negative for cough, hemoptysis, shortness of breath and snoring.   Endocrine: Negative for heat intolerance and polyphagia.  Hematologic/Lymphatic: Negative for bleeding problem. Does not bruise/bleed easily.  Skin: Negative for flushing, nail changes, rash and suspicious lesions.  Musculoskeletal: Negative for arthritis, joint pain, muscle cramps, myalgias, neck pain and stiffness.  Gastrointestinal: Negative for abdominal pain, bowel incontinence, diarrhea and excessive appetite.  Genitourinary: Negative for decreased libido, genital sores and incomplete emptying.  Neurological: Negative for brief paralysis, focal weakness, headaches and loss of balance.  Psychiatric/Behavioral: Negative for altered mental status, depression and suicidal ideas.  Allergic/Immunologic: Negative for HIV exposure and persistent infections.    EKGs/Labs/Other Studies Reviewed:    The following studies were reviewed today:   EKG: None today  August 25, 2015 left heart  catheterization There is mild left ventricular systolic dysfunction.  Normal coronary arteries. Mild global LV dysfunction with diffuse hypo-kinesis an ejection fraction of 45 to less than 50%.  RECOMMENDATION: Medical therapy for her mild residual LV dysfunction.  I suspect her nuclear scan abnormalities are artifactual due to the patient's obesity and body habitus.  Recent Labs: 05/31/2020: TSH 2.820 03/01/2021: ALT 22; BUN 12; Creatinine, Ser 0.76; Potassium 4.1; Sodium 140 04/04/2021: Hemoglobin 15.2; Platelets 375  Recent Lipid Panel    Component Value Date/Time   CHOL 141 03/01/2021 0939   TRIG 235 (H) 03/01/2021 0939   HDL 40 03/01/2021 0939   CHOLHDL 3.5 03/01/2021 0939   CHOLHDL 4.5 03/29/2016 0907   VLDL 78 (H) 03/29/2016 0907   LDLCALC 63 03/01/2021 0939   LDLDIRECT 130.8 01/15/2014 1209    Physical Exam:    VS:  BP 100/78 (BP Location: Left Arm)   Pulse (!) 102   Ht 5' 6"  (1.676 m)   Wt 243 lb 3.2 oz (110.3 kg)   SpO2 96%   BMI 39.25 kg/m     Wt Readings from Last 3 Encounters:  04/04/21 243 lb 3.2 oz (110.3 kg)  03/07/21 247 lb (112 kg)  02/14/21 250 lb (113.4 kg)     GEN: Well nourished, well developed in no acute distress HEENT: Normal NECK: No JVD; No carotid bruits LYMPHATICS: No lymphadenopathy CARDIAC: S1S2 noted,RRR, no murmurs, rubs, gallops RESPIRATORY:  Clear to auscultation without rales, wheezing or rhonchi  ABDOMEN: Soft, non-tender, non-distended, +bowel sounds, no guarding. EXTREMITIES: No edema, No cyanosis, no clubbing MUSCULOSKELETAL:  No deformity  SKIN: Warm and dry NEUROLOGIC:  Alert and oriented x 3, non-focal PSYCHIATRIC:  Normal affect, good insight  ASSESSMENT:    1. Nonischemic cardiomyopathy ([place])   2. SOB (shortness of breath)   3. Fatigue, unspecified type   4. Daytime somnolence   5. Vitamin D deficiency    PLAN:    With her worsening shortness of breath I will like to repeat her echocardiogram history of  nonischemic cardiomyopathy.  She does have leg edema we will continue her current testosterone as she had not been taking this medication she needs refill which will  send refills. In terms of her daytime somnolence and fatigue we will be beneficial to proceed with a sleep study in this patient.  blood work with vitamin D, CBC, B12, leg swelling Lasix 40 mg daily refilled  The patient is in agreement with the above plan. The patient left the office in stable condition.  The patient will follow up in 4 months   Medication Adjustments/Labs and Tests Ordered: Current medicines are reviewed at length with the patient today.  Concerns regarding medicines are outlined above.  Orders Placed This Encounter  Procedures   CBC with Differential/Platelet   VITAMIN D 25 Hydroxy (Vit-D Deficiency, Fractures)   Vitamin B12   ECHOCARDIOGRAM COMPLETE   Split night study   Meds ordered this encounter  Medications   furosemide (LASIX) 40 MG tablet    Sig: Take 1 tablet (40 mg total) by mouth daily.    Dispense:  90 tablet    Refill:  3    Patient Instructions  Medication Instructions:  Your physician recommends that you continue on your current medications as directed. Please refer to the Current Medication list given to you today.  Refilled Lasix *If you need a refill on your cardiac medications before your next appointment, please call your pharmacy*   Lab Work: Your physician recommends that you return for lab work in:  TODAY: CBC, Vitamin D, Vitamin B12 If you have labs (blood work) drawn today and your tests are completely normal, you will receive your results only by: [place] (if you have [place]) OR A paper copy in the mail If you have any lab test that is abnormal or we need to change your treatment, we will call you to review the results.   Testing/Procedures: Your physician has requested that you have an echocardiogram. Echocardiography is a painless test that uses sound  waves to create images of your heart. It provides your doctor with information about the size and shape of your heart and how well your heart's chambers and valves are working. This procedure takes approximately one hour. There are no restrictions for this procedure.  Your physician has recommended that you have a sleep study. This test records several body functions during sleep, including: brain activity, eye movement, oxygen and carbon dioxide blood levels, heart rate and rhythm, breathing rate and rhythm, the flow of air through your mouth and nose, snoring, body muscle movements, and chest and belly movement.    Follow-Up: At [facility], you and your health needs are our priority.  As part of our continuing mission to provide you with exceptional heart care, we have created designated Provider Care Teams.  These Care Teams include your primary Cardiologist (physician) and Advanced Practice Providers (APPs -  Physician Assistants and Nurse Practitioners) who all work together to provide you with the care you need, when you need it.  We recommend signing up for the patient portal called "MyChart".  Sign up information is provided on this After Visit Summary.  MyChart is used to connect with patients for Virtual Visits (Telemedicine).  Patients are able to view lab/test results, encounter notes, upcoming appointments, etc.  Non-urgent messages can be sent to your provider as well.   To learn more about what you can do with MyChart, go to [email or web address].    Your next appointment:   4 month(s)  The format for your next appointment:   In Person  Provider:   Berniece Salines, DO   Other Instructions  Echocardiogram An echocardiogram is a  test that uses sound waves (ultrasound) to produce images of the heart. Images from an echocardiogram can provide important information about: Heart size and shape. The size and thickness and movement of your heart's walls. Heart muscle function and  strength. Heart valve function or if you have stenosis. Stenosis is when the heart valves are too narrow. If blood is flowing backward through the heart valves (regurgitation). A tumor or infectious growth around the heart valves. Areas of heart muscle that are not working well because of poor blood flow or injury from a heart attack. Aneurysm detection. An aneurysm is a weak or damaged part of an artery wall. The wall bulges out from the normal force of blood pumping through the body. Tell a health care provider about: Any allergies you have. All medicines you are taking, including vitamins, herbs, eye drops, creams, and over-the-counter medicines. Any blood disorders you have. Any surgeries you have had. Any medical conditions you have. Whether you are pregnant or may be pregnant. What are the risks? Generally, this is a safe test. However, problems may occur, including an allergic reaction to dye (contrast) that may be used during the test. What happens before the test? No specific preparation is needed. You may eat and drink normally. What happens during the test?  You will take off your clothes from the waist up and put on a hospital gown. Electrodes or electrocardiogram (ECG)patches may be placed on your chest. The electrodes or patches are then connected to a device that monitors your heart rate and rhythm. You will lie down on a table for an ultrasound exam. A gel will be applied to your chest to help sound waves pass through your skin. A handheld device, called a transducer, will be pressed against your chest and moved over your heart. The transducer produces sound waves that travel to your heart and bounce back (or "echo" back) to the transducer. These sound waves will be captured in real-time and changed into images of your heart that can be viewed on a video monitor. The images will be recorded on a computer and reviewed by your health care provider. You may be asked to change  positions or hold your breath for a short time. This makes it easier to get different views or better views of your heart. In some cases, you may receive contrast through an IV in one of your veins. This can improve the quality of the pictures from your heart. The procedure may vary among health care providers and hospitals. What can I expect after the test? You may return to your normal, everyday life, including diet, activities, and medicines, unless your health care provider tells you not to do that. Follow these instructions at home: It is up to you to get the results of your test. Ask your health care provider, or the department that is doing the test, when your results will be ready. Keep all follow-up visits. This is important. Summary An echocardiogram is a test that uses sound waves (ultrasound) to produce images of the heart. Images from an echocardiogram can provide important information about the size and shape of your heart, heart muscle function, heart valve function, and other possible heart problems. You do not need to do anything to prepare before this test. You may eat and drink normally. After the echocardiogram is completed, you may return to your normal, everyday life, unless your health care provider tells you not to do that. This information is not intended to replace advice  given to you by your health care provider. Make sure you discuss any questions you have with your health care provider. Document Revised: 01/19/2021 Document Reviewed: 12/30/2019 Elsevier Patient Education  [employer].   Sleep Study, Adult A sleep study (polysomnogram) is a series of tests done while you are sleeping. A sleep study records your brain waves, heart rate, breathing rate, oxygen level, and eye and leg movements. A sleep study helps your health care provider: See how well you sleep. Diagnose a sleep disorder. Determine how severe your sleep disorder is. Create a plan to treat  your sleep disorder. Your health care provider may recommend a sleep study if you: Feel sleepy on most days. Snore loudly while sleeping. Have unusual behaviors while you sleep, such as walking. Have brief periods in which you stop breathing during sleep (sleepapnea). Fall asleep suddenly during the day (narcolepsy). Have trouble falling asleep or staying asleep (insomnia). Feel like you need to move your legs when trying to fall asleep (restless legs syndrome). Move your legs by flexing and extending them regularly while asleep (periodic limb movement disorder). Act out your dreams while you sleep (sleep behavior disorder). Feel like you cannot move when you first wake up (sleep paralysis). What tests are part of a sleep study? Most sleep studies record the following during sleep: Brain activity. Eye movements. Heart rate and rhythm. Breathing rate and rhythm. Blood-oxygen level. Blood pressure. Chest and belly movement as you breathe. Arm and leg movements. Snoring or other noises. Body position. Where are sleep studies done? Sleep studies are done at sleep centers. A sleep center may be inside a hospital, office, or clinic. The room where you have the study may look like a hospital room or a hotel room. The health care providers doing the study may come in and out of the room during the study. Most of the time, they will be in another room monitoring your test as you sleep. How are sleep studies done? Most sleep studies are done during a normal period of time for a full night of sleep. You will arrive at the study center in the evening and go home in the morning. Before the test Bring your pajamas and toothbrush with you to the sleep study. Do not have caffeine on the day of your sleep study. Do not drink alcohol on the day of your sleep study. Your health care provider will let you know if you should stop taking any of your regular medicines before the test. During the test    Round, sticky patches with sensors attached to recording wires (electrodes) are placed on your scalp, face, chest, and limbs. Wires from all the electrodes and sensors run from your bed to a computer. The wires can be taken off and put back on if you need to get out of bed to go to the bathroom. A sensor is placed over your nose to measure airflow. A finger clip is put on your finger or ear to measure your blood oxygen level (pulse oximetry). A belt is placed around your belly and a belt is placed around your chest to measure breathing movements. If you have signs of the sleep disorder called sleep apnea during your test, you may get a treatment mask to wear for the second half of the night. The mask provides positive airway pressure (PAP) to help you breathe better during sleep. This may greatly improve your sleep apnea. You will then have all tests done again with the mask  in place to see if your measurements and recordings change. After the test A medical doctor who specializes in sleep will evaluate the results of your sleep study and share them with you and your primary health care provider. Based on your results, your medical history, and a physical exam, you may be diagnosed with a sleep disorder, such as: Sleep apnea. Restless legs syndrome. Sleep-related behavior disorder. Sleep-related movement disorders. Sleep-related seizure disorders. Your health care team will help determine your treatment options based on your diagnosis. This may include: Improving your sleep habits (sleep hygiene). Wearing a continuous positive airway pressure (CPAP) or bi-level positive airway pressure (BPAP) mask. Wearing an oral device at night to improve breathing and reduce snoring. Taking medicines. Follow these instructions at home: Take over-the-counter and prescription medicines only as told by your health care provider. If you are instructed to use a CPAP or BPAP mask, make sure you use it nightly  as directed. Make any lifestyle changes that your health care provider recommends. If you were given a device to open your airway while you sleep, use it only as told by your health care provider. Do not use any tobacco products, such as cigarettes, chewing tobacco, and e-cigarettes. If you need help quitting, ask your health care provider. Keep all follow-up visits as told by your health care provider. This is important. Summary A sleep study (polysomnogram) is a series of tests done while you are sleeping. It shows how well you sleep. Most sleep studies are done over one full night of sleep. You will arrive at the study center in the evening and go home in the morning. If you have signs of the sleep disorder called sleep apnea during your test, you may get a treatment mask to wear for the second half of the night. A medical doctor who specializes in sleep will evaluate the results of your sleep study and share them with your primary health care provider. This information is not intended to replace advice given to you by your health care provider. Make sure you discuss any questions you have with your health care provider. Document Revised: 12/15/2020 Document Reviewed: 06/05/2017 Elsevier Patient Education  2022 [place].   Adopting a Healthy Lifestyle.  Know what a healthy weight is for you (roughly BMI <25) and aim to maintain this   Aim for 7+ servings of fruits and vegetables daily   65-80+ fluid ounces of water or unsweet tea for healthy kidneys   Limit to max 1 drink of alcohol per day; avoid smoking/tobacco   Limit animal fats in diet for cholesterol and heart health - choose grass fed whenever available   Avoid highly processed foods, and foods high in saturated/trans fats   Aim for low stress - take time to unwind and care for your mental health   Aim for 150 min of moderate intensity exercise weekly for heart health, and weights twice weekly for bone health   Aim for  7-9 hours of sleep daily   When it comes to diets, agreement about the perfect plan isnt easy to find, even among the experts. Experts at the [facility] developed an idea known as the Healthy Eating Plate. Just imagine a plate divided into logical, healthy portions.   The emphasis is on diet quality:   Load up on vegetables and fruits - one-half of your plate: Aim for color and variety, and remember that potatoes dont count.   Go for whole grains - one-quarter  of your plate: Whole wheat, barley, wheat berries, quinoa, oats, brown rice, and foods made with them. If you want pasta, go with whole wheat pasta.   Protein power - one-quarter of your plate: Fish, chicken, beans, and nuts are all healthy, versatile protein sources. Limit red meat.   The diet, however, does go beyond the plate, offering a few other suggestions.   Use healthy plant oils, such as olive, canola, soy, corn, sunflower and peanut. Check the labels, and avoid partially hydrogenated oil, which have unhealthy trans fats.   If youre thirsty, drink water. Coffee and tea are good in moderation, but skip sugary drinks and limit milk and dairy products to one or two daily servings.   The type of carbohydrate in the diet is more important than the amount. Some sources of carbohydrates, such as vegetables, fruits, whole grains, and beans-are healthier than others.   Finally, stay active  Signed, Berniece Salines, DO  04/05/2021 1:34 PM    [facility]

## 2021-04-04 NOTE — Patient Instructions (Addendum)
Medication Instructions:  Your physician recommends that you continue on your current medications as directed. Please refer to the Current Medication list given to you today.  Refilled Lasix *If you need a refill on your cardiac medications before your next appointment, please call your pharmacy*   Lab Work: Your physician recommends that you return for lab work in:  TODAY: CBC, Vitamin D, Vitamin B12 If you have labs (blood work) drawn today and your tests are completely normal, you will receive your results only by: [facility] (if you have [facility]) OR A paper copy in the mail If you have any lab test that is abnormal or we need to change your treatment, we will call you to review the results.   Testing/Procedures: Your physician has requested that you have an echocardiogram. Echocardiography is a painless test that uses sound waves to create images of your heart. It provides your doctor with information about the size and shape of your heart and how well your heart's chambers and valves are working. This procedure takes approximately one hour. There are no restrictions for this procedure.  Your physician has recommended that you have a sleep study. This test records several body functions during sleep, including: brain activity, eye movement, oxygen and carbon dioxide blood levels, heart rate and rhythm, breathing rate and rhythm, the flow of air through your mouth and nose, snoring, body muscle movements, and chest and belly movement.    Follow-Up: At [facility], you and your health needs are our priority.  As part of our continuing mission to provide you with exceptional heart care, we have created designated Provider Care Teams.  These Care Teams include your primary Cardiologist (physician) and Advanced Practice Providers (APPs -  Physician Assistants and Nurse Practitioners) who all work together to provide you with the care you need, when you need it.  We recommend signing  up for the patient portal called "MyChart".  Sign up information is provided on this After Visit Summary.  MyChart is used to connect with patients for Virtual Visits (Telemedicine).  Patients are able to view lab/test results, encounter notes, upcoming appointments, etc.  Non-urgent messages can be sent to your provider as well.   To learn more about what you can do with MyChart, go to [email or web address].    Your next appointment:   4 month(s)  The format for your next appointment:   In Person  Provider:   Berniece Salines, DO   Other Instructions  Echocardiogram An echocardiogram is a test that uses sound waves (ultrasound) to produce images of the heart. Images from an echocardiogram can provide important information about: Heart size and shape. The size and thickness and movement of your heart's walls. Heart muscle function and strength. Heart valve function or if you have stenosis. Stenosis is when the heart valves are too narrow. If blood is flowing backward through the heart valves (regurgitation). A tumor or infectious growth around the heart valves. Areas of heart muscle that are not working well because of poor blood flow or injury from a heart attack. Aneurysm detection. An aneurysm is a weak or damaged part of an artery wall. The wall bulges out from the normal force of blood pumping through the body. Tell a health care provider about: Any allergies you have. All medicines you are taking, including vitamins, herbs, eye drops, creams, and over-the-counter medicines. Any blood disorders you have. Any surgeries you have had. Any medical conditions you have. Whether you are pregnant or may be  pregnant. What are the risks? Generally, this is a safe test. However, problems may occur, including an allergic reaction to dye (contrast) that may be used during the test. What happens before the test? No specific preparation is needed. You may eat and drink normally. What happens  during the test?  You will take off your clothes from the waist up and put on a hospital gown. Electrodes or electrocardiogram (ECG)patches may be placed on your chest. The electrodes or patches are then connected to a device that monitors your heart rate and rhythm. You will lie down on a table for an ultrasound exam. A gel will be applied to your chest to help sound waves pass through your skin. A handheld device, called a transducer, will be pressed against your chest and moved over your heart. The transducer produces sound waves that travel to your heart and bounce back (or "echo" back) to the transducer. These sound waves will be captured in real-time and changed into images of your heart that can be viewed on a video monitor. The images will be recorded on a computer and reviewed by your health care provider. You may be asked to change positions or hold your breath for a short time. This makes it easier to get different views or better views of your heart. In some cases, you may receive contrast through an IV in one of your veins. This can improve the quality of the pictures from your heart. The procedure may vary among health care providers and hospitals. What can I expect after the test? You may return to your normal, everyday life, including diet, activities, and medicines, unless your health care provider tells you not to do that. Follow these instructions at home: It is up to you to get the results of your test. Ask your health care provider, or the department that is doing the test, when your results will be ready. Keep all follow-up visits. This is important. Summary An echocardiogram is a test that uses sound waves (ultrasound) to produce images of the heart. Images from an echocardiogram can provide important information about the size and shape of your heart, heart muscle function, heart valve function, and other possible heart problems. You do not need to do anything to prepare  before this test. You may eat and drink normally. After the echocardiogram is completed, you may return to your normal, everyday life, unless your health care provider tells you not to do that. This information is not intended to replace advice given to you by your health care provider. Make sure you discuss any questions you have with your health care provider. Document Revised: 01/19/2021 Document Reviewed: 12/30/2019 Elsevier Patient Education  [place].   Sleep Study, Adult A sleep study (polysomnogram) is a series of tests done while you are sleeping. A sleep study records your brain waves, heart rate, breathing rate, oxygen level, and eye and leg movements. A sleep study helps your health care provider: See how well you sleep. Diagnose a sleep disorder. Determine how severe your sleep disorder is. Create a plan to treat your sleep disorder. Your health care provider may recommend a sleep study if you: Feel sleepy on most days. Snore loudly while sleeping. Have unusual behaviors while you sleep, such as walking. Have brief periods in which you stop breathing during sleep (sleepapnea). Fall asleep suddenly during the day (narcolepsy). Have trouble falling asleep or staying asleep (insomnia). Feel like you need to move your legs when trying to fall  asleep (restless legs syndrome). Move your legs by flexing and extending them regularly while asleep (periodic limb movement disorder). Act out your dreams while you sleep (sleep behavior disorder). Feel like you cannot move when you first wake up (sleep paralysis). What tests are part of a sleep study? Most sleep studies record the following during sleep: Brain activity. Eye movements. Heart rate and rhythm. Breathing rate and rhythm. Blood-oxygen level. Blood pressure. Chest and belly movement as you breathe. Arm and leg movements. Snoring or other noises. Body position. Where are sleep studies done? Sleep studies are  done at sleep centers. A sleep center may be inside a hospital, office, or clinic. The room where you have the study may look like a hospital room or a hotel room. The health care providers doing the study may come in and out of the room during the study. Most of the time, they will be in another room monitoring your test as you sleep. How are sleep studies done? Most sleep studies are done during a normal period of time for a full night of sleep. You will arrive at the study center in the evening and go home in the morning. Before the test Bring your pajamas and toothbrush with you to the sleep study. Do not have caffeine on the day of your sleep study. Do not drink alcohol on the day of your sleep study. Your health care provider will let you know if you should stop taking any of your regular medicines before the test. During the test   Round, sticky patches with sensors attached to recording wires (electrodes) are placed on your scalp, face, chest, and limbs. Wires from all the electrodes and sensors run from your bed to a computer. The wires can be taken off and put back on if you need to get out of bed to go to the bathroom. A sensor is placed over your nose to measure airflow. A finger clip is put on your finger or ear to measure your blood oxygen level (pulse oximetry). A belt is placed around your belly and a belt is placed around your chest to measure breathing movements. If you have signs of the sleep disorder called sleep apnea during your test, you may get a treatment mask to wear for the second half of the night. The mask provides positive airway pressure (PAP) to help you breathe better during sleep. This may greatly improve your sleep apnea. You will then have all tests done again with the mask in place to see if your measurements and recordings change. After the test A medical doctor who specializes in sleep will evaluate the results of your sleep study and share them with you and  your primary health care provider. Based on your results, your medical history, and a physical exam, you may be diagnosed with a sleep disorder, such as: Sleep apnea. Restless legs syndrome. Sleep-related behavior disorder. Sleep-related movement disorders. Sleep-related seizure disorders. Your health care team will help determine your treatment options based on your diagnosis. This may include: Improving your sleep habits (sleep hygiene). Wearing a continuous positive airway pressure (CPAP) or bi-level positive airway pressure (BPAP) mask. Wearing an oral device at night to improve breathing and reduce snoring. Taking medicines. Follow these instructions at home: Take over-the-counter and prescription medicines only as told by your health care provider. If you are instructed to use a CPAP or BPAP mask, make sure you use it nightly as directed. Make any lifestyle changes that your health care  provider recommends. If you were given a device to open your airway while you sleep, use it only as told by your health care provider. Do not use any tobacco products, such as cigarettes, chewing tobacco, and e-cigarettes. If you need help quitting, ask your health care provider. Keep all follow-up visits as told by your health care provider. This is important. Summary A sleep study (polysomnogram) is a series of tests done while you are sleeping. It shows how well you sleep. Most sleep studies are done over one full night of sleep. You will arrive at the study center in the evening and go home in the morning. If you have signs of the sleep disorder called sleep apnea during your test, you may get a treatment mask to wear for the second half of the night. A medical doctor who specializes in sleep will evaluate the results of your sleep study and share them with your primary health care provider. This information is not intended to replace advice given to you by your health care provider. Make sure you  discuss any questions you have with your health care provider. Document Revised: 12/15/2020 Document Reviewed: 06/05/2017 Elsevier Patient Education  [place].

## 2021-04-05 LAB — VITAMIN D 25 HYDROXY (VIT D DEFICIENCY, FRACTURES): Vit D, 25-Hydroxy: 41.7 ng/mL (ref 30.0–100.0)

## 2021-04-05 LAB — CBC WITH DIFFERENTIAL/PLATELET
Basophils Absolute: 0.1 10*3/uL (ref 0.0–0.2)
Basos: 1 %
EOS (ABSOLUTE): 0.4 10*3/uL (ref 0.0–0.4)
Eos: 4 %
Hematocrit: 47 % — ABNORMAL HIGH (ref 34.0–46.6)
Hemoglobin: 15.2 g/dL (ref 11.1–15.9)
Immature Grans (Abs): 0 10*3/uL (ref 0.0–0.1)
Immature Granulocytes: 0 %
Lymphocytes Absolute: 2.8 10*3/uL (ref 0.7–3.1)
Lymphs: 29 %
MCH: 28.6 pg (ref 26.6–33.0)
MCHC: 32.3 g/dL (ref 31.5–35.7)
MCV: 88 fL (ref 79–97)
Monocytes Absolute: 0.6 10*3/uL (ref 0.1–0.9)
Monocytes: 7 %
Neutrophils Absolute: 5.5 10*3/uL (ref 1.4–7.0)
Neutrophils: 59 %
Platelets: 375 10*3/uL (ref 150–450)
RBC: 5.32 x10E6/uL — ABNORMAL HIGH (ref 3.77–5.28)
RDW: 13.1 % (ref 11.7–15.4)
WBC: 9.4 10*3/uL (ref 3.4–10.8)

## 2021-04-05 LAB — VITAMIN B12: Vitamin B-12: 1068 pg/mL (ref 232–1245)

## 2021-04-13 ENCOUNTER — Other Ambulatory Visit: Payer: Self-pay

## 2021-04-13 ENCOUNTER — Ambulatory Visit (INDEPENDENT_AMBULATORY_CARE_PROVIDER_SITE_OTHER): Payer: Managed Care, Other (non HMO) | Admitting: Obstetrics & Gynecology

## 2021-04-13 ENCOUNTER — Encounter: Payer: Self-pay | Admitting: Obstetrics & Gynecology

## 2021-04-13 VITALS — BP 124/82 | Ht 65.5 in | Wt 241.0 lb

## 2021-04-13 DIAGNOSIS — E6609 Other obesity due to excess calories: Secondary | ICD-10-CM | POA: Diagnosis not present

## 2021-04-13 DIAGNOSIS — Z01419 Encounter for gynecological examination (general) (routine) without abnormal findings: Secondary | ICD-10-CM | POA: Diagnosis not present

## 2021-04-13 DIAGNOSIS — Z9851 Tubal ligation status: Secondary | ICD-10-CM | POA: Diagnosis not present

## 2021-04-13 DIAGNOSIS — Z6839 Body mass index (BMI) 39.0-39.9, adult: Secondary | ICD-10-CM

## 2021-04-13 DIAGNOSIS — Z30431 Encounter for routine checking of intrauterine contraceptive device: Secondary | ICD-10-CM | POA: Diagnosis not present

## 2021-04-13 NOTE — Progress Notes (Signed)
Carolyn Salazar Nov 19, 1970 [id number]   History:    50 y.o. G1P1L1 Married. S/P TL.  Has grand-children.   RP:  New (>3 yrs) patient presenting for annual gyn exam    HPI: Mirena IUD since 05/2019 for control of menorrhagia.  Patient is status post bilateral tubal ligation.  No menses or BTB since insertion of the new IUD.  Occasional mild cramps, no pelvic pain currently.  Abstinent.  Last Pap 04/2019 Neg. Urine and bowel movements normal.  Breasts normal.  Last mammo 06/2020 Neg.  Body mass index 39.49.  Patient is sedentary.  Health labs with family MD.  Carolyn Salazar 2022.   Past medical history,surgical history, family history and social history were all reviewed and documented in the EPIC chart.  Gynecologic History No LMP recorded. (Menstrual status: IUD).  Obstetric History OB History  Gravida Para Term Preterm AB Living  1 1 1     1   SAB IAB Ectopic Multiple Live Births          1    # Outcome Date GA Lbr Len/2nd Weight Sex Delivery Anes PTL Lv  1 Term     F Vag-Spont  N LIV     ROS: A ROS was performed and pertinent positives and negatives are included in the history.  GENERAL: No fevers or chills. HEENT: No change in vision, no earache, sore throat or sinus congestion. NECK: No pain or stiffness. CARDIOVASCULAR: No chest pain or pressure. No palpitations. PULMONARY: No shortness of breath, cough or wheeze. GASTROINTESTINAL: No abdominal pain, nausea, vomiting or diarrhea, melena or bright red blood per rectum. GENITOURINARY: No urinary frequency, urgency, hesitancy or dysuria. MUSCULOSKELETAL: No joint or muscle pain, no back pain, no recent trauma. DERMATOLOGIC: No rash, no itching, no lesions. ENDOCRINE: No polyuria, polydipsia, no heat or cold intolerance. No recent change in weight. HEMATOLOGICAL: No anemia or easy bruising or bleeding. NEUROLOGIC: No headache, seizures, numbness, tingling or weakness. PSYCHIATRIC: No depression, no loss of interest in normal activity or change  in sleep pattern.     Exam:   BP 124/82   Ht 5' 5.5" (1.664 m)   Wt 241 lb (109.3 kg)   BMI 39.49 kg/m   Body mass index is 39.49 kg/m.  General appearance : Well developed well nourished female. No acute distress HEENT: Eyes: no retinal hemorrhage or exudates,  Neck supple, trachea midline, no carotid bruits, no thyroidmegaly Lungs: Clear to auscultation, no rhonchi or wheezes, or rib retractions  Heart: Regular rate and rhythm, no murmurs or gallops Breast:Examined in sitting and supine position were symmetrical in appearance, no palpable masses or tenderness,  no skin retraction, no nipple inversion, no nipple discharge, no skin discoloration, no axillary or supraclavicular lymphadenopathy Abdomen: no palpable masses or tenderness, no rebound or guarding Extremities: no edema or skin discoloration or tenderness  Pelvic: Vulva: Normal             Vagina: No gross lesions or discharge  Cervix: No gross lesions or discharge.  IUD strings felt at [facility].  Uterus  AV, normal size, shape and consistency, non-tender and mobile  Adnexa  Without masses or tenderness  Anus: Normal   Assessment/Plan:  50 y.o. female for annual exam   1. Well female exam with routine gynecological exam Mirena IUD since 05/2019 for control of menorrhagia.  Patient is status post bilateral tubal ligation.  No menses or BTB since insertion of the new IUD.  Occasional mild cramps, no pelvic pain currently.  Abstinent.  Last Pap 04/2019 Neg. Urine and bowel movements normal.  Breasts normal.  Last mammo 06/2020 Neg.  Body mass index 39.49.  Patient is sedentary.  Health labs with family MD.  Carolyn Salazar 2022.  2. S/P tubal ligation  3. Encounter for routine checking of intrauterine contraceptive device (IUD) Well on new Mirena IUD x 05/2019 with no menses or BTB.  IUD in good location.  4. Class 2 obesity due to excess calories without serious comorbidity with body mass index (BMI) of 39.0 to 39.9 in adult  Recommend  a lower calorie/carb diet and increased fitness activities.  Princess Bruins MD, 2:16 PM 04/13/2021

## 2021-04-19 ENCOUNTER — Ambulatory Visit: Payer: Managed Care, Other (non HMO) | Admitting: Gastroenterology

## 2021-04-19 ENCOUNTER — Telehealth: Payer: Self-pay | Admitting: *Deleted

## 2021-04-19 NOTE — Telephone Encounter (Signed)
Prior Authorization for split night sleep study sent to [facility] via web portal. Tracking Number [id number].

## 2021-04-19 NOTE — Telephone Encounter (Signed)
-----   Message from Orvan July, RN sent at 04/04/2021  9:18 AM EST ----- Needs precert for sleep study.  Thank you,    [facility] [place]

## 2021-04-21 ENCOUNTER — Other Ambulatory Visit: Payer: Self-pay | Admitting: Cardiovascular Disease

## 2021-04-22 ENCOUNTER — Ambulatory Visit (HOSPITAL_COMMUNITY): Payer: Managed Care, Other (non HMO) | Attending: Cardiology

## 2021-04-22 ENCOUNTER — Other Ambulatory Visit: Payer: Self-pay

## 2021-04-22 DIAGNOSIS — I5030 Unspecified diastolic (congestive) heart failure: Secondary | ICD-10-CM

## 2021-04-22 DIAGNOSIS — R5383 Other fatigue: Secondary | ICD-10-CM | POA: Diagnosis present

## 2021-04-22 LAB — ECHOCARDIOGRAM COMPLETE
Area-P 1/2: 3.95 cm2
S' Lateral: 3.1 cm

## 2021-04-22 MED ORDER — PERFLUTREN LIPID MICROSPHERE
1.0000 mL | INTRAVENOUS | Status: AC | PRN
Start: 2021-04-22 — End: 2021-04-22
  Administered 2021-04-22: 3 mL via INTRAVENOUS
  Administered 2021-04-22: 1 mL via INTRAVENOUS

## 2021-04-26 ENCOUNTER — Other Ambulatory Visit: Payer: Self-pay

## 2021-04-26 MED ORDER — ENTRESTO 24-26 MG PO TABS: 1.0000 | ORAL_TABLET | Freq: Two times a day (BID) | ORAL | 3 refills | Status: DC

## 2021-04-26 NOTE — Progress Notes (Signed)
Prescription sent to pharmacy.

## 2021-05-06 ENCOUNTER — Ambulatory Visit (INDEPENDENT_AMBULATORY_CARE_PROVIDER_SITE_OTHER): Payer: Managed Care, Other (non HMO) | Admitting: Cardiology

## 2021-05-06 ENCOUNTER — Other Ambulatory Visit: Payer: Self-pay

## 2021-05-06 ENCOUNTER — Encounter: Payer: Self-pay | Admitting: Cardiology

## 2021-05-06 VITALS — BP 100/80 | HR 102 | Ht 66.0 in | Wt 241.0 lb

## 2021-05-06 DIAGNOSIS — E118 Type 2 diabetes mellitus with unspecified complications: Secondary | ICD-10-CM

## 2021-05-06 DIAGNOSIS — I429 Cardiomyopathy, unspecified: Secondary | ICD-10-CM

## 2021-05-06 DIAGNOSIS — E66812 Obesity, class 2: Secondary | ICD-10-CM

## 2021-05-06 DIAGNOSIS — E782 Mixed hyperlipidemia: Secondary | ICD-10-CM

## 2021-05-06 DIAGNOSIS — R931 Abnormal findings on diagnostic imaging of heart and coronary circulation: Secondary | ICD-10-CM

## 2021-05-06 DIAGNOSIS — Z79899 Other long term (current) drug therapy: Secondary | ICD-10-CM | POA: Diagnosis not present

## 2021-05-06 DIAGNOSIS — Z6837 Body mass index (BMI) 37.0-37.9, adult: Secondary | ICD-10-CM

## 2021-05-06 DIAGNOSIS — R0989 Other specified symptoms and signs involving the circulatory and respiratory systems: Secondary | ICD-10-CM

## 2021-05-06 DIAGNOSIS — R0602 Shortness of breath: Secondary | ICD-10-CM | POA: Diagnosis not present

## 2021-05-06 NOTE — Progress Notes (Signed)
Cardiology Office Note:    Date:  05/07/2021   ID:  Carolyn Salazar, DOB August 27, 1970, MRN [id number]  PCP:  Rita Ohara, MD  Cardiologist:  Skeet Latch, MD  Electrophysiologist:  None   Referring MD: Rita Ohara, MD   " I am still having shortness of breath"  History of Present Illness:    Carolyn Salazar is a 50 y.o. female with a hx of  nonischemic cardiomyopathy which based on chart review going back to 2000 she had a ejection fraction of 20%, Catheterization was done then when she lived in [place], revealed normal coronaries.  In 2017 she had some chest pain.  Nuclear stress test was abnormal.  Diagnostic catheterization in March 2017 showed no significant coronary disease.  Her ejection fraction then was 45 to 50%, diabetes mellitus on metformin and Jardiance, hypertension, GERD.  I saw the patient on April 04, 2021 at that time she was present significant shortness of breath on exertion.  And daytime somnolence.  With this I recommend we get a echocardiogram to reassess her LV function.  We also talked about the benefits of sleep study at that time.  Data vitamin D level and the patient I refilled her Lasix.  I requested to see the patient after her echocardiogram showed depressed ejection fraction and regional wall motion abnormalities.  She is here today she also tells me she has been experiencing significant shortness of breath and has some intermittent chest pain that.  She has had some relief with the Lasix. In the meantime I have stopped the patient lisinopril and started her on Entresto.  She is doing well on Entresto.  Past Medical History:  Diagnosis Date   Allergy    Arthritis    Hands, Knees RT>LT   BENIGN NEOPLASM OF SKIN SITE UNSPECIFIED    benign mole   Blood transfusion without reported diagnosis    BUNIONS, BILATERAL    CARDIOMYOPATHY 02/1999   EF 20% in 02/1999, improved over time-  EF 40-45% at cath 2017   Chest pain    normal coronaries 2000 and 2017  (after an abnormal Myoview)   CHF (congestive heart failure) (HCC)    DEPRESSION    DIABETES MELLITUS, TYPE II, CONTROLLED, MILD    DYSLIPIDEMIA    GERD (gastroesophageal reflux disease)    Hypotension    November, 8118   METABOLIC SYNDROME X    hypertriglycerides 04/2008, hyperglycemia   MIGRAINE HEADACHE    NASH (nonalcoholic steatohepatitis)    OBESITY    Shingles (herpes zoster) polyneuropathy    Sinus tachycardia     Past Surgical History:  Procedure Laterality Date   BUNIONECTOMY  2012   LEFT   BUNIONECTOMY Right    CARDIAC CATHETERIZATION  2000   no CAD   CARDIAC CATHETERIZATION N/A 08/25/2015   Procedure: Left Heart Cath and Coronary Angiography;  Surgeon: Troy Sine, MD;  Location: [facility];  Service: Cardiovascular;  Laterality: N/A;   heart biopsy     mole removed     right arm age 89   vein scope      Current Medications: Current Meds  Medication Sig   ARIPiprazole (ABILIFY) 15 MG tablet Take 15 mg by mouth daily.   atorvastatin (LIPITOR) 10 MG tablet TAKE 1 TABLET BY MOUTH EVERY DAY   b complex vitamins tablet Take 1 tablet by mouth daily.   carvedilol (COREG) 25 MG tablet TAKE 1 TABLET (25 MG TOTAL) BY MOUTH 2 (TWO) TIMES DAILY WITH  A MEAL. KEEP FOLLOW UP APPOINTMENT FOR FUTURE REFILLS.   cetirizine (ZYRTEC) 10 MG tablet Take 10 mg by mouth daily.     Continuous Blood Gluc Sensor (FREESTYLE LIBRE 3 SENSOR) MISC 1 each by Does not apply route every 14 (fourteen) days.   empagliflozin (JARDIANCE) 10 MG TABS tablet Take 1 tablet (10 mg total) by mouth daily.   esomeprazole (NEXIUM) 20 MG capsule Take 20 mg by mouth daily at 12 noon.   Eszopiclone 3 MG TABS Take 3 mg by mouth at bedtime as needed.   FLUoxetine (PROZAC) 40 MG capsule Take 40 mg by mouth daily.   furosemide (LASIX) 40 MG tablet Take 1 tablet (40 mg total) by mouth daily.   Glucosamine-Chondroitin 500-400 MG CAPS Take 1 tablet by mouth daily.    Glucose Blood (BLOOD GLUCOSE TEST  STRIPS) STRP Test 1-2 times a day. Pt uses onetouch verio flex meter. Dx e11.9   glucose blood test strip Test once a day. Pt uses one touch verio meter   KLOR-CON M20 20 MEQ tablet TAKE 1 TABLET BY MOUTH EVERY DAY   lamoTRIgine (LAMICTAL) 200 MG tablet Take 400 mg by mouth daily.   Lancets (ONETOUCH DELICA PLUS IONGEX52W) MISC Test 1-2 times a day. Pt uses one touch verio flex meter dx e11.9   metFORMIN (GLUCOPHAGE) 1000 MG tablet Take 1 tablet (1,000 mg total) by mouth 2 (two) times daily with a meal.   Omega-3 Fatty Acids (FISH OIL) 1000 MG CAPS Take 3,000 mg by mouth.   sacubitril-valsartan (ENTRESTO) 24-26 MG Take 1 tablet by mouth 2 (two) times daily.   SUMAtriptan (IMITREX) 100 MG tablet TAKE 1 TABLET EVERY 2 HOURS AS NEEDED FOR MIGRAINE (NO MORE THAN 2 TABLETS IN A 24 HOUR PERIOD) MAY REPEAT IN 2 HOURS IF HEADACHE PERSISTS OR RECURS   terconazole (TERAZOL 3) 0.8 % vaginal cream USE 1 APPLICATOR VAGINALLY FOR 3 DAYS (Patient taking differently: USE 1 APPLICATOR VAGINALLY FOR 3 DAYS AS NEEDED)   Current Facility-Administered Medications for the 05/06/21 encounter (Office Visit) with Berniece Salines, DO  Medication   levonorgestrel (MIRENA) 20 MCG/24HR IUD     Allergies:   Sulfonamide derivatives   Social History   Socioeconomic History   Marital status: Married    Spouse name: Not on file   Number of children: Not on file   Years of education: Not on file   Highest education level: Not on file  Occupational History   Not on file  Tobacco Use   Smoking status: Never   Smokeless tobacco: Never  Vaping Use   Vaping Use: Never used  Substance and Sexual Activity   Alcohol use: Not Currently    Comment: 1 glass of wine every other week.    Drug use: No   Sexual activity: Not Currently    Birth control/protection: Other-see comments    Comment: TUBAL LIGATION, 1st intercourse- 20, partners- 3  Other Topics Concern   Not on file  Social History Narrative   Pt lives with her  spouse, daughter and college friend.       Epworth Sleepiness Scale = 10 (as of 07/21/2015)   Social Determinants of Health   Financial Resource Strain: Not on file  Food Insecurity: Not on file  Transportation Needs: Not on file  Physical Activity: Not on file  Stress: Not on file  Social Connections: Not on file     Family History: The patient's family history includes Arthritis in her sister and another  family member; Cancer in her mother; Diabetes in her father and mother; Diabetes type II in her mother; Heart attack in her father; Heart failure in her father; Hyperlipidemia in her mother and another family member; Hypertension in an other family member. There is no history of Colon cancer, Colon polyps, Esophageal cancer, Rectal cancer, or Stomach cancer.  ROS:   Review of Systems  Constitution: Negative for decreased appetite, fever and weight gain.  HENT: Negative for congestion, ear discharge, hoarse voice and sore throat.   Eyes: Negative for discharge, redness, vision loss in right eye and visual halos.  Cardiovascular: Negative for chest pain, dyspnea on exertion, leg swelling, orthopnea and palpitations.  Respiratory: Negative for cough, hemoptysis, shortness of breath and snoring.   Endocrine: Negative for heat intolerance and polyphagia.  Hematologic/Lymphatic: Negative for bleeding problem. Does not bruise/bleed easily.  Skin: Negative for flushing, nail changes, rash and suspicious lesions.  Musculoskeletal: Negative for arthritis, joint pain, muscle cramps, myalgias, neck pain and stiffness.  Gastrointestinal: Negative for abdominal pain, bowel incontinence, diarrhea and excessive appetite.  Genitourinary: Negative for decreased libido, genital sores and incomplete emptying.  Neurological: Negative for brief paralysis, focal weakness, headaches and loss of balance.  Psychiatric/Behavioral: Negative for altered mental status, depression and suicidal ideas.   Allergic/Immunologic: Negative for HIV exposure and persistent infections.    EKGs/Labs/Other Studies Reviewed:    The following studies were reviewed today:   EKG: None today  Transthoracic echocardiogram April 22, 2021 IMPRESSIONS     1. Left ventricular ejection fraction, by estimation, is 40%. The left  ventricle has moderately decreased function. The left ventricle  demonstrates regional wall motion abnormalities (see scoring  diagram/findings for description). There is mild  asymmetric left ventricular hypertrophy of the infero-lateral segment.  Left ventricular diastolic parameters are consistent with Grade I  diastolic dysfunction (impaired relaxation).   2. Right ventricular systolic function is normal. The right ventricular  size is normal. Tricuspid regurgitation signal is inadequate for assessing  PA pressure.   3. The mitral valve is normal in structure. Trivial mitral valve  regurgitation. No evidence of mitral stenosis.   4. The aortic valve is tricuspid. Aortic valve regurgitation is not  visualized. No aortic stenosis is present.   5. The inferior vena cava is normal in size with greater than 50%  respiratory variability, suggesting right atrial pressure of 3 mmHg.   FINDINGS   Left Ventricle: Left ventricular ejection fraction, by estimation, is  40%. The left ventricle has moderately decreased function. The left  ventricle demonstrates regional wall motion abnormalities. The left  ventricular internal cavity size was normal in  size. There is mild asymmetric left ventricular hypertrophy of the  infero-lateral segment. Left ventricular diastolic parameters are  consistent with Grade I diastolic dysfunction (impaired relaxation).      LV Wall Scoring:  The apical lateral segment, apical septal segment, apical anterior  segment,  and apical inferior segment are hypokinetic.   Right Ventricle: The right ventricular size is normal. No increase in   right ventricular wall thickness. Right ventricular systolic function is  normal. Tricuspid regurgitation signal is inadequate for assessing PA  pressure.   Left Atrium: Left atrial size was normal in size.   Right Atrium: Right atrial size was normal in size.   Pericardium: There is no evidence of pericardial effusion.   Mitral Valve: The mitral valve is normal in structure. Trivial mitral  valve regurgitation. No evidence of mitral valve stenosis.   Tricuspid Valve:  The tricuspid valve is normal in structure. Tricuspid  valve regurgitation is trivial. No evidence of tricuspid stenosis.   Aortic Valve: The aortic valve is tricuspid. Aortic valve regurgitation is  not visualized. No aortic stenosis is present.   Pulmonic Valve: The pulmonic valve was normal in structure. Pulmonic valve  regurgitation is trivial. No evidence of pulmonic stenosis.   Aorta: The aortic root is normal in size and structure.   Venous: The inferior vena cava is normal in size with greater than 50%  respiratory variability, suggesting right atrial pressure of 3 mmHg.   IAS/Shunts: The interatrial septum was not well visualized  Recent Labs: 05/31/2020: TSH 2.820 03/01/2021: ALT 22; BUN 12; Creatinine, Ser 0.76; Potassium 4.1; Sodium 140 04/04/2021: Hemoglobin 15.2; Platelets 375  Recent Lipid Panel    Component Value Date/Time   CHOL 141 03/01/2021 0939   TRIG 235 (H) 03/01/2021 0939   HDL 40 03/01/2021 0939   CHOLHDL 3.5 03/01/2021 0939   CHOLHDL 4.5 03/29/2016 0907   VLDL 78 (H) 03/29/2016 0907   LDLCALC 63 03/01/2021 0939   LDLDIRECT 130.8 01/15/2014 1209    Physical Exam:    VS:  BP 100/80 (BP Location: Left Arm)    Pulse (!) 102    Ht 5' 6"  (1.676 m)    Wt 241 lb (109.3 kg)    SpO2 93%    BMI 38.90 kg/m     Wt Readings from Last 3 Encounters:  05/06/21 241 lb (109.3 kg)  04/13/21 241 lb (109.3 kg)  04/04/21 243 lb 3.2 oz (110.3 kg)     GEN: Well nourished, well developed in no  acute distress HEENT: Normal NECK: No JVD; No carotid bruits LYMPHATICS: No lymphadenopathy CARDIAC: S1S2 noted,RRR, no murmurs, rubs, gallops RESPIRATORY:  Clear to auscultation without rales, wheezing or rhonchi  ABDOMEN: Soft, non-tender, non-distended, +bowel sounds, no guarding. EXTREMITIES: No edema, No cyanosis, no clubbing MUSCULOSKELETAL:  No deformity  SKIN: Warm and dry NEUROLOGIC:  Alert and oriented x 3, non-focal PSYCHIATRIC:  Normal affect, good insight  ASSESSMENT:    1. Cardiomyopathy, unspecified type ([place])   2. SOB (shortness of breath)   3. Medication management   4. Class 2 severe obesity with serious comorbidity and body mass index (BMI) of 37.0 to 37.9 in adult, unspecified obesity type ([place])   5. Mixed hyperlipidemia   6. Depressed left ventricular ejection fraction   7. Type 2 diabetes mellitus with complication, without long-term current use of insulin (HCC)    PLAN:     Her ejection fraction has decreased with wall motion abnormalities, she did have a heart catheterization back in 2017 which was normal.  But the patient is diabetic and she is having progressive symptoms.  I would like her right and left heart catheterization to understand her right-sided heart pressures as well as rule out coronary artery disease.  If her coronaries are completely normal I will move on with cardiac MRI to make sure there is no infiltrative disease that is contributing to her cardiomyopathy.  In the meantime she was recently transitioned from lisinopril to [facility] we will continue her current dose of Entresto, as well as her carvedilol 25 mg twice daily.  We may need to add [place].  Right now her blood pressure is marginal so we will hold off on adding that medication for now.  Diabetes mellitus-this is being managed by his primary care doctor.  No adjustments for antidiabetic medications were made today.  The patient understands the  need to lose weight with diet and  exercise. We have discussed specific strategies for this.  Sleep study pending  The patient is in agreement with the above plan. The patient left the office in stable condition.  The patient will follow up in   Medication Adjustments/Labs and Tests Ordered: Current medicines are reviewed at length with the patient today.  Concerns regarding medicines are outlined above.  Orders Placed This Encounter  Procedures   Basic Metabolic Panel (BMET)   Magnesium   CBC with Differential/Platelet   No orders of the defined types were placed in this encounter.   Patient Instructions  Medication Instructions:  Your physician recommends that you continue on your current medications as directed. Please refer to the Current Medication list given to you today.  *If you need a refill on your cardiac medications before your next appointment, please call your pharmacy*   Lab Work: Your physician recommends that you return for lab work in:  [place] 12/19-12/23:  BMET, Mag, CBC If you have labs (blood work) drawn today and your tests are completely normal, you will receive your results only by: [place] (if you have MyChart) OR A paper copy in the mail If you have any lab test that is abnormal or we need to change your treatment, we will call you to review the results.   Testing/Procedures:  [place] [address] Dept: [phone] Loc: [place]  05/06/2021  You are scheduled for a Cardiac Catheterization on Wednesday, December 28 with Dr. Lauree Chandler.  1. Please arrive at the [facility] (Main Entrance A) at [facility]: [address] [place], [place] [address] at 9:30 AM (This time is two hours before your procedure to ensure your preparation). Free valet parking service is available.   Special note: Every effort is made to have  your procedure done on time. Please understand that emergencies sometimes delay scheduled procedures.  2. Diet: Do not eat solid foods after midnight.  The patient may have clear liquids until 5am upon the day of the procedure.  3. Labs: You will need to have blood drawn on   4. Medication instructions in preparation for your procedure:   Contrast Allergy: No  Do not take Diabetes Med Glucophage (Metformin) on the day of the procedure and HOLD 48 HOURS AFTER THE PROCEDURE.  On the morning of your procedure, take your Aspirin and any morning medicines NOT listed above.  You may use sips of water.  5. Plan for one night stay--bring personal belongings. 6. Bring a current list of your medications and current insurance cards. 7. You MUST have a responsible person to drive you home. 8. Someone MUST be with you the first 24 hours after you arrive home or your discharge will be delayed. 9. Please wear clothes that are easy to get on and off and wear slip-on shoes.  Thank you for allowing [place] to care for you!   -- [facility]    Follow-Up: At [facility], you and your health needs are our priority.  As part of our continuing mission to provide you with exceptional heart care, we have created designated Provider Care Teams.  These Care Teams include your primary Cardiologist (physician) and Advanced Practice Providers (APPs -  Physician Assistants and Nurse Practitioners) who all work together to provide you with the care you need, when you need  it.  We recommend signing up for the patient portal called "MyChart".  Sign up information is provided on this After Visit Summary.  MyChart is used to connect with patients for Virtual Visits (Telemedicine).  Patients are able to view lab/test results, encounter notes, upcoming appointments, etc.  Non-urgent messages can be sent to your provider as well.   To learn more about what you can do with MyChart, go to  [email or web address].    Your next appointment:    2-4 week(s)  The format for your next appointment:   In Person  Provider:   Berniece Salines, DO   Other Instructions     Adopting a Healthy Lifestyle.  Know what a healthy weight is for you (roughly BMI <25) and aim to maintain this   Aim for 7+ servings of fruits and vegetables daily   65-80+ fluid ounces of water or unsweet tea for healthy kidneys   Limit to max 1 drink of alcohol per day; avoid smoking/tobacco   Limit animal fats in diet for cholesterol and heart health - choose grass fed whenever available   Avoid highly processed foods, and foods high in saturated/trans fats   Aim for low stress - take time to unwind and care for your mental health   Aim for 150 min of moderate intensity exercise weekly for heart health, and weights twice weekly for bone health   Aim for 7-9 hours of sleep daily   When it comes to diets, agreement about the perfect plan isnt easy to find, even among the experts. Experts at the [place] developed an idea known as the Healthy Eating Plate. Just imagine a plate divided into logical, healthy portions.   The emphasis is on diet quality:   Load up on vegetables and fruits - one-half of your plate: Aim for color and variety, and remember that potatoes dont count.   Go for whole grains - one-quarter of your plate: Whole wheat, barley, wheat berries, quinoa, oats, brown rice, and foods made with them. If you want pasta, go with whole wheat pasta.   Protein power - one-quarter of your plate: Fish, chicken, beans, and nuts are all healthy, versatile protein sources. Limit red meat.   The diet, however, does go beyond the plate, offering a few other suggestions.   Use healthy plant oils, such as olive, canola, soy, corn, sunflower and peanut. Check the labels, and avoid partially hydrogenated oil, which have unhealthy trans fats.   If youre thirsty, drink water.  Coffee and tea are good in moderation, but skip sugary drinks and limit milk and dairy products to one or two daily servings.   The type of carbohydrate in the diet is more important than the amount. Some sources of carbohydrates, such as vegetables, fruits, whole grains, and beans-are healthier than others.   Finally, stay active  Signed, Berniece Salines, DO  05/07/2021 7:58 PM    [facility]

## 2021-05-06 NOTE — Patient Instructions (Addendum)
Medication Instructions:  Your physician recommends that you continue on your current medications as directed. Please refer to the Current Medication list given to you today.  *If you need a refill on your cardiac medications before your next appointment, please call your pharmacy*   Lab Work: Your physician recommends that you return for lab work in:  [place] 12/19-12/23:  BMET, Mag, CBC If you have labs (blood work) drawn today and your tests are completely normal, you will receive your results only by: [place] (if you have MyChart) OR A paper copy in the mail If you have any lab test that is abnormal or we need to change your treatment, we will call you to review the results.   Testing/Procedures:  [place] [address] Dept: [phone] Loc: [place]  05/06/2021  You are scheduled for a Cardiac Catheterization on Wednesday, December 28 with Dr. Lauree Chandler.  1. Please arrive at the [facility] (Main Entrance A) at [facility]: [address] [place], [place] [address] at 9:30 AM (This time is two hours before your procedure to ensure your preparation). Free valet parking service is available.   Special note: Every effort is made to have your procedure done on time. Please understand that emergencies sometimes delay scheduled procedures.  2. Diet: Do not eat solid foods after midnight.  The patient may have clear liquids until 5am upon the day of the procedure.  3. Labs: You will need to have blood drawn on   4. Medication instructions in preparation for your procedure:   Contrast Allergy: No  Do not take Diabetes Med Glucophage (Metformin) on the day of the procedure and HOLD 48 HOURS AFTER THE PROCEDURE.  On the morning of your procedure, take your Aspirin and any morning medicines NOT listed above.  You may use  sips of water.  5. Plan for one night stay--bring personal belongings. 6. Bring a current list of your medications and current insurance cards. 7. You MUST have a responsible person to drive you home. 8. Someone MUST be with you the first 24 hours after you arrive home or your discharge will be delayed. 9. Please wear clothes that are easy to get on and off and wear slip-on shoes.  Thank you for allowing [place] to care for you!   -- [place] [facility]    Follow-Up: At [facility], you and your health needs are our priority.  As part of our continuing mission to provide you with exceptional heart care, we have created designated Provider Care Teams.  These Care Teams include your primary Cardiologist (physician) and Advanced Practice Providers (APPs -  Physician Assistants and Nurse Practitioners) who all work together to provide you with the care you need, when you need it.  We recommend signing up for the patient portal called "MyChart".  Sign up information is provided on this After Visit Summary.  MyChart is used to connect with patients for Virtual Visits (Telemedicine).  Patients are able to view lab/test results, encounter notes, upcoming appointments, etc.  Non-urgent messages can be sent to your provider as well.   To learn more about what you can do with MyChart, go to [email or web address].    Your next appointment:    2-4 week(s)  The format for your next appointment:   In Person  Provider:   Berniece Salines, DO   Other Instructions

## 2021-05-06 NOTE — H&P (View-Only) (Signed)
Cardiology Office Note:    Date:  05/07/2021   ID:  Carolyn Salazar, DOB [birth year], MRN [id number]  PCP:  Rita Ohara, MD  Cardiologist:  Skeet Latch, MD  Electrophysiologist:  None   Referring MD: Rita Ohara, MD   " I am still having shortness of breath"  History of Present Illness:    Carolyn Salazar is a 50 y.o. female with a hx of  nonischemic cardiomyopathy which based on chart review going back to 2000 she had a ejection fraction of 20%, Catheterization was done then when she lived in [place], revealed normal coronaries.  In 2017 she had some chest pain.  Nuclear stress test was abnormal.  Diagnostic catheterization in March 2017 showed no significant coronary disease.  Her ejection fraction then was 45 to 50%, diabetes mellitus on metformin and Jardiance, hypertension, GERD.  I saw the patient on April 04, 2021 at that time she was present significant shortness of breath on exertion.  And daytime somnolence.  With this I recommend we get a echocardiogram to reassess her LV function.  We also talked about the benefits of sleep study at that time.  Data vitamin D level and the patient I refilled her Lasix.  I requested to see the patient after her echocardiogram showed depressed ejection fraction and regional wall motion abnormalities.  She is here today she also tells me she has been experiencing significant shortness of breath and has some intermittent chest pain that.  She has had some relief with the Lasix. In the meantime I have stopped the patient lisinopril and started her on Entresto.  She is doing well on Entresto.  Past Medical History:  Diagnosis Date   Allergy    Arthritis    Hands, Knees RT>LT   BENIGN NEOPLASM OF SKIN SITE UNSPECIFIED    benign mole   Blood transfusion without reported diagnosis    BUNIONS, BILATERAL    CARDIOMYOPATHY 02/1999   EF 20% in 02/1999, improved over time-  EF 40-45% at cath 2017   Chest pain    normal coronaries 2000 and 2017  (after an abnormal Myoview)   CHF (congestive heart failure) (HCC)    DEPRESSION    DIABETES MELLITUS, TYPE II, CONTROLLED, MILD    DYSLIPIDEMIA    GERD (gastroesophageal reflux disease)    Hypotension    November, 9509   METABOLIC SYNDROME X    hypertriglycerides 04/2008, hyperglycemia   MIGRAINE HEADACHE    NASH (nonalcoholic steatohepatitis)    OBESITY    Shingles (herpes zoster) polyneuropathy    Sinus tachycardia     Past Surgical History:  Procedure Laterality Date   BUNIONECTOMY  2012   LEFT   BUNIONECTOMY Right    CARDIAC CATHETERIZATION  2000   no CAD   CARDIAC CATHETERIZATION N/A 08/25/2015   Procedure: Left Heart Cath and Coronary Angiography;  Surgeon: Troy Sine, MD;  Location: [facility];  Service: Cardiovascular;  Laterality: N/A;   heart biopsy     mole removed     right arm age 18   vein scope      Current Medications: Current Meds  Medication Sig   ARIPiprazole (ABILIFY) 15 MG tablet Take 15 mg by mouth daily.   atorvastatin (LIPITOR) 10 MG tablet TAKE 1 TABLET BY MOUTH EVERY DAY   b complex vitamins tablet Take 1 tablet by mouth daily.   carvedilol (COREG) 25 MG tablet TAKE 1 TABLET (25 MG TOTAL) BY MOUTH 2 (TWO) TIMES DAILY WITH  A MEAL. KEEP FOLLOW UP APPOINTMENT FOR FUTURE REFILLS.   cetirizine (ZYRTEC) 10 MG tablet Take 10 mg by mouth daily.     Continuous Blood Gluc Sensor (FREESTYLE LIBRE 3 SENSOR) MISC 1 each by Does not apply route every 14 (fourteen) days.   empagliflozin (JARDIANCE) 10 MG TABS tablet Take 1 tablet (10 mg total) by mouth daily.   esomeprazole (NEXIUM) 20 MG capsule Take 20 mg by mouth daily at 12 noon.   Eszopiclone 3 MG TABS Take 3 mg by mouth at bedtime as needed.   FLUoxetine (PROZAC) 40 MG capsule Take 40 mg by mouth daily.   furosemide (LASIX) 40 MG tablet Take 1 tablet (40 mg total) by mouth daily.   Glucosamine-Chondroitin 500-400 MG CAPS Take 1 tablet by mouth daily.    Glucose Blood (BLOOD GLUCOSE TEST  STRIPS) STRP Test 1-2 times a day. Pt uses onetouch verio flex meter. Dx e11.9   glucose blood test strip Test once a day. Pt uses one touch verio meter   KLOR-CON M20 20 MEQ tablet TAKE 1 TABLET BY MOUTH EVERY DAY   lamoTRIgine (LAMICTAL) 200 MG tablet Take 400 mg by mouth daily.   Lancets (ONETOUCH DELICA PLUS ZOXWRU04V) MISC Test 1-2 times a day. Pt uses one touch verio flex meter dx e11.9   metFORMIN (GLUCOPHAGE) 1000 MG tablet Take 1 tablet (1,000 mg total) by mouth 2 (two) times daily with a meal.   Omega-3 Fatty Acids (FISH OIL) 1000 MG CAPS Take 3,000 mg by mouth.   sacubitril-valsartan (ENTRESTO) 24-26 MG Take 1 tablet by mouth 2 (two) times daily.   SUMAtriptan (IMITREX) 100 MG tablet TAKE 1 TABLET EVERY 2 HOURS AS NEEDED FOR MIGRAINE (NO MORE THAN 2 TABLETS IN A 24 HOUR PERIOD) MAY REPEAT IN 2 HOURS IF HEADACHE PERSISTS OR RECURS   terconazole (TERAZOL 3) 0.8 % vaginal cream USE 1 APPLICATOR VAGINALLY FOR 3 DAYS (Patient taking differently: USE 1 APPLICATOR VAGINALLY FOR 3 DAYS AS NEEDED)   Current Facility-Administered Medications for the 05/06/21 encounter (Office Visit) with Berniece Salines, DO  Medication   levonorgestrel (MIRENA) 20 MCG/24HR IUD     Allergies:   Sulfonamide derivatives   Social History   Socioeconomic History   Marital status: Married    Spouse name: Not on file   Number of children: Not on file   Years of education: Not on file   Highest education level: Not on file  Occupational History   Not on file  Tobacco Use   Smoking status: Never   Smokeless tobacco: Never  Vaping Use   Vaping Use: Never used  Substance and Sexual Activity   Alcohol use: Not Currently    Comment: 1 glass of wine every other week.    Drug use: No   Sexual activity: Not Currently    Birth control/protection: Other-see comments    Comment: TUBAL LIGATION, 1st intercourse- 20, partners- 3  Other Topics Concern   Not on file  Social History Narrative   Pt lives with her  spouse, daughter and college friend.       Epworth Sleepiness Scale = 10 (as of 07/21/2015)   Social Determinants of Health   Financial Resource Strain: Not on file  Food Insecurity: Not on file  Transportation Needs: Not on file  Physical Activity: Not on file  Stress: Not on file  Social Connections: Not on file     Family History: The patient's family history includes Arthritis in her sister and another  family member; Cancer in her mother; Diabetes in her father and mother; Diabetes type II in her mother; Heart attack in her father; Heart failure in her father; Hyperlipidemia in her mother and another family member; Hypertension in an other family member. There is no history of Colon cancer, Colon polyps, Esophageal cancer, Rectal cancer, or Stomach cancer.  ROS:   Review of Systems  Constitution: Negative for decreased appetite, fever and weight gain.  HENT: Negative for congestion, ear discharge, hoarse voice and sore throat.   Eyes: Negative for discharge, redness, vision loss in right eye and visual halos.  Cardiovascular: Negative for chest pain, dyspnea on exertion, leg swelling, orthopnea and palpitations.  Respiratory: Negative for cough, hemoptysis, shortness of breath and snoring.   Endocrine: Negative for heat intolerance and polyphagia.  Hematologic/Lymphatic: Negative for bleeding problem. Does not bruise/bleed easily.  Skin: Negative for flushing, nail changes, rash and suspicious lesions.  Musculoskeletal: Negative for arthritis, joint pain, muscle cramps, myalgias, neck pain and stiffness.  Gastrointestinal: Negative for abdominal pain, bowel incontinence, diarrhea and excessive appetite.  Genitourinary: Negative for decreased libido, genital sores and incomplete emptying.  Neurological: Negative for brief paralysis, focal weakness, headaches and loss of balance.  Psychiatric/Behavioral: Negative for altered mental status, depression and suicidal ideas.   Allergic/Immunologic: Negative for HIV exposure and persistent infections.    EKGs/Labs/Other Studies Reviewed:    The following studies were reviewed today:   EKG: None today  Transthoracic echocardiogram April 22, 2021 IMPRESSIONS     1. Left ventricular ejection fraction, by estimation, is 40%. The left  ventricle has moderately decreased function. The left ventricle  demonstrates regional wall motion abnormalities (see scoring  diagram/findings for description). There is mild  asymmetric left ventricular hypertrophy of the infero-lateral segment.  Left ventricular diastolic parameters are consistent with Grade I  diastolic dysfunction (impaired relaxation).   2. Right ventricular systolic function is normal. The right ventricular  size is normal. Tricuspid regurgitation signal is inadequate for assessing  PA pressure.   3. The mitral valve is normal in structure. Trivial mitral valve  regurgitation. No evidence of mitral stenosis.   4. The aortic valve is tricuspid. Aortic valve regurgitation is not  visualized. No aortic stenosis is present.   5. The inferior vena cava is normal in size with greater than 50%  respiratory variability, suggesting right atrial pressure of 3 mmHg.   FINDINGS   Left Ventricle: Left ventricular ejection fraction, by estimation, is  40%. The left ventricle has moderately decreased function. The left  ventricle demonstrates regional wall motion abnormalities. The left  ventricular internal cavity size was normal in  size. There is mild asymmetric left ventricular hypertrophy of the  infero-lateral segment. Left ventricular diastolic parameters are  consistent with Grade I diastolic dysfunction (impaired relaxation).      LV Wall Scoring:  The apical lateral segment, apical septal segment, apical anterior  segment,  and apical inferior segment are hypokinetic.   Right Ventricle: The right ventricular size is normal. No increase in   right ventricular wall thickness. Right ventricular systolic function is  normal. Tricuspid regurgitation signal is inadequate for assessing PA  pressure.   Left Atrium: Left atrial size was normal in size.   Right Atrium: Right atrial size was normal in size.   Pericardium: There is no evidence of pericardial effusion.   Mitral Valve: The mitral valve is normal in structure. Trivial mitral  valve regurgitation. No evidence of mitral valve stenosis.   Tricuspid Valve:  The tricuspid valve is normal in structure. Tricuspid  valve regurgitation is trivial. No evidence of tricuspid stenosis.   Aortic Valve: The aortic valve is tricuspid. Aortic valve regurgitation is  not visualized. No aortic stenosis is present.   Pulmonic Valve: The pulmonic valve was normal in structure. Pulmonic valve  regurgitation is trivial. No evidence of pulmonic stenosis.   Aorta: The aortic root is normal in size and structure.   Venous: The inferior vena cava is normal in size with greater than 50%  respiratory variability, suggesting right atrial pressure of 3 mmHg.   IAS/Shunts: The interatrial septum was not well visualized  Recent Labs: 05/31/2020: TSH 2.820 03/01/2021: ALT 22; BUN 12; Creatinine, Ser 0.76; Potassium 4.1; Sodium 140 04/04/2021: Hemoglobin 15.2; Platelets 375  Recent Lipid Panel    Component Value Date/Time   CHOL 141 03/01/2021 0939   TRIG 235 (H) 03/01/2021 0939   HDL 40 03/01/2021 0939   CHOLHDL 3.5 03/01/2021 0939   CHOLHDL 4.5 03/29/2016 0907   VLDL 78 (H) 03/29/2016 0907   LDLCALC 63 03/01/2021 0939   LDLDIRECT 130.8 01/15/2014 1209    Physical Exam:    VS:  BP 100/80 (BP Location: Left Arm)    Pulse (!) 102    Ht 5' 6"  (1.676 m)    Wt 241 lb (109.3 kg)    SpO2 93%    BMI 38.90 kg/m     Wt Readings from Last 3 Encounters:  05/06/21 241 lb (109.3 kg)  04/13/21 241 lb (109.3 kg)  04/04/21 243 lb 3.2 oz (110.3 kg)     GEN: Well nourished, well developed in no  acute distress HEENT: Normal NECK: No JVD; No carotid bruits LYMPHATICS: No lymphadenopathy CARDIAC: S1S2 noted,RRR, no murmurs, rubs, gallops RESPIRATORY:  Clear to auscultation without rales, wheezing or rhonchi  ABDOMEN: Soft, non-tender, non-distended, +bowel sounds, no guarding. EXTREMITIES: No edema, No cyanosis, no clubbing MUSCULOSKELETAL:  No deformity  SKIN: Warm and dry NEUROLOGIC:  Alert and oriented x 3, non-focal PSYCHIATRIC:  Normal affect, good insight  ASSESSMENT:    1. Cardiomyopathy, unspecified type ([place])   2. SOB (shortness of breath)   3. Medication management   4. Class 2 severe obesity with serious comorbidity and body mass index (BMI) of 37.0 to 37.9 in adult, unspecified obesity type ([place])   5. Mixed hyperlipidemia   6. Depressed left ventricular ejection fraction   7. Type 2 diabetes mellitus with complication, without long-term current use of insulin (HCC)    PLAN:     Her ejection fraction has decreased with wall motion abnormalities, she did have a heart catheterization back in 2017 which was normal.  But the patient is diabetic and she is having progressive symptoms.  I would like her right and left heart catheterization to understand her right-sided heart pressures as well as rule out coronary artery disease.  If her coronaries are completely normal I will move on with cardiac MRI to make sure there is no infiltrative disease that is contributing to her cardiomyopathy.  In the meantime she was recently transitioned from lisinopril to [facility] we will continue her current dose of Entresto, as well as her carvedilol 25 mg twice daily.  We may need to add [place].  Right now her blood pressure is marginal so we will hold off on adding that medication for now.  Diabetes mellitus-this is being managed by his primary care doctor.  No adjustments for antidiabetic medications were made today.  The patient understands the  need to lose weight with diet and  exercise. We have discussed specific strategies for this.  Sleep study pending  The patient is in agreement with the above plan. The patient left the office in stable condition.  The patient will follow up in   Medication Adjustments/Labs and Tests Ordered: Current medicines are reviewed at length with the patient today.  Concerns regarding medicines are outlined above.  Orders Placed This Encounter  Procedures   Basic Metabolic Panel (BMET)   Magnesium   CBC with Differential/Platelet   No orders of the defined types were placed in this encounter.   Patient Instructions  Medication Instructions:  Your physician recommends that you continue on your current medications as directed. Please refer to the Current Medication list given to you today.  *If you need a refill on your cardiac medications before your next appointment, please call your pharmacy*   Lab Work: Your physician recommends that you return for lab work in:  [place] 12/19-12/23:  BMET, Mag, CBC If you have labs (blood work) drawn today and your tests are completely normal, you will receive your results only by: [place] (if you have MyChart) OR A paper copy in the mail If you have any lab test that is abnormal or we need to change your treatment, we will call you to review the results.   Testing/Procedures:  [place] [address] Dept: [phone] Loc: [place]  05/06/2021  You are scheduled for a Cardiac Catheterization on Wednesday, December 28 with Dr. Lauree Chandler.  1. Please arrive at the [facility] (Main Entrance A) at [facility]: [address] [place], [place] [address] at 9:30 AM (This time is two hours before your procedure to ensure your preparation). Free valet parking service is available.   Special note: Every effort is made to have  your procedure done on time. Please understand that emergencies sometimes delay scheduled procedures.  2. Diet: Do not eat solid foods after midnight.  The patient may have clear liquids until 5am upon the day of the procedure.  3. Labs: You will need to have blood drawn on   4. Medication instructions in preparation for your procedure:   Contrast Allergy: No  Do not take Diabetes Med Glucophage (Metformin) on the day of the procedure and HOLD 48 HOURS AFTER THE PROCEDURE.  On the morning of your procedure, take your Aspirin and any morning medicines NOT listed above.  You may use sips of water.  5. Plan for one night stay--bring personal belongings. 6. Bring a current list of your medications and current insurance cards. 7. You MUST have a responsible person to drive you home. 8. Someone MUST be with you the first 24 hours after you arrive home or your discharge will be delayed. 9. Please wear clothes that are easy to get on and off and wear slip-on shoes.  Thank you for allowing [place] to care for you!   -- [place] [facility]    Follow-Up: At [facility], you and your health needs are our priority.  As part of our continuing mission to provide you with exceptional heart care, we have created designated Provider Care Teams.  These Care Teams include your primary Cardiologist (physician) and Advanced Practice Providers (APPs -  Physician Assistants and Nurse Practitioners) who all work together to provide you with the care you need, when you need  it.  We recommend signing up for the patient portal called "MyChart".  Sign up information is provided on this After Visit Summary.  MyChart is used to connect with patients for Virtual Visits (Telemedicine).  Patients are able to view lab/test results, encounter notes, upcoming appointments, etc.  Non-urgent messages can be sent to your provider as well.   To learn more about what you can do with MyChart, go to  [email or web address].    Your next appointment:    2-4 week(s)  The format for your next appointment:   In Person  Provider:   Berniece Salines, DO   Other Instructions     Adopting a Healthy Lifestyle.  Know what a healthy weight is for you (roughly BMI <25) and aim to maintain this   Aim for 7+ servings of fruits and vegetables daily   65-80+ fluid ounces of water or unsweet tea for healthy kidneys   Limit to max 1 drink of alcohol per day; avoid smoking/tobacco   Limit animal fats in diet for cholesterol and heart health - choose grass fed whenever available   Avoid highly processed foods, and foods high in saturated/trans fats   Aim for low stress - take time to unwind and care for your mental health   Aim for 150 min of moderate intensity exercise weekly for heart health, and weights twice weekly for bone health   Aim for 7-9 hours of sleep daily   When it comes to diets, agreement about the perfect plan isnt easy to find, even among the experts. Experts at the [employer] developed an idea known as the Healthy Eating Plate. Just imagine a plate divided into logical, healthy portions.   The emphasis is on diet quality:   Load up on vegetables and fruits - one-half of your plate: Aim for color and variety, and remember that potatoes dont count.   Go for whole grains - one-quarter of your plate: Whole wheat, barley, wheat berries, quinoa, oats, brown rice, and foods made with them. If you want pasta, go with whole wheat pasta.   Protein power - one-quarter of your plate: Fish, chicken, beans, and nuts are all healthy, versatile protein sources. Limit red meat.   The diet, however, does go beyond the plate, offering a few other suggestions.   Use healthy plant oils, such as olive, canola, soy, corn, sunflower and peanut. Check the labels, and avoid partially hydrogenated oil, which have unhealthy trans fats.   If youre thirsty, drink water.  Coffee and tea are good in moderation, but skip sugary drinks and limit milk and dairy products to one or two daily servings.   The type of carbohydrate in the diet is more important than the amount. Some sources of carbohydrates, such as vegetables, fruits, whole grains, and beans-are healthier than others.   Finally, stay active  Signed, Berniece Salines, DO  05/07/2021 7:58 PM    [facility]

## 2021-05-07 DIAGNOSIS — R0989 Other specified symptoms and signs involving the circulatory and respiratory systems: Secondary | ICD-10-CM | POA: Insufficient documentation

## 2021-05-07 DIAGNOSIS — R0602 Shortness of breath: Secondary | ICD-10-CM | POA: Insufficient documentation

## 2021-05-07 DIAGNOSIS — I429 Cardiomyopathy, unspecified: Secondary | ICD-10-CM | POA: Insufficient documentation

## 2021-05-07 DIAGNOSIS — Z79899 Other long term (current) drug therapy: Secondary | ICD-10-CM | POA: Insufficient documentation

## 2021-05-07 DIAGNOSIS — E782 Mixed hyperlipidemia: Secondary | ICD-10-CM | POA: Insufficient documentation

## 2021-05-07 DIAGNOSIS — R931 Abnormal findings on diagnostic imaging of heart and coronary circulation: Secondary | ICD-10-CM | POA: Insufficient documentation

## 2021-05-07 DIAGNOSIS — E118 Type 2 diabetes mellitus with unspecified complications: Secondary | ICD-10-CM | POA: Insufficient documentation

## 2021-05-09 ENCOUNTER — Telehealth: Payer: Self-pay

## 2021-05-09 NOTE — Telephone Encounter (Signed)
Letter has been sent to patient instructing them to call us if they are still interested in completing their sleep study. If we have not received a response from the patient within 30 days of this notice, the order will be cancelled and they will need to discuss the need for a sleep study at their next office visit.

## 2021-05-13 LAB — CBC WITH DIFFERENTIAL/PLATELET
Basophils Absolute: 0.1 10*3/uL (ref 0.0–0.2)
Basos: 1 %
EOS (ABSOLUTE): 0.4 10*3/uL (ref 0.0–0.4)
Eos: 4 %
Hematocrit: 46 % (ref 34.0–46.6)
Hemoglobin: 15.3 g/dL (ref 11.1–15.9)
Immature Grans (Abs): 0 10*3/uL (ref 0.0–0.1)
Immature Granulocytes: 0 %
Lymphocytes Absolute: 3.1 10*3/uL (ref 0.7–3.1)
Lymphs: 29 %
MCH: 28.7 pg (ref 26.6–33.0)
MCHC: 33.3 g/dL (ref 31.5–35.7)
MCV: 86 fL (ref 79–97)
Monocytes Absolute: 0.7 10*3/uL (ref 0.1–0.9)
Monocytes: 7 %
Neutrophils Absolute: 6.3 10*3/uL (ref 1.4–7.0)
Neutrophils: 59 %
Platelets: 396 10*3/uL (ref 150–450)
RBC: 5.34 x10E6/uL — ABNORMAL HIGH (ref 3.77–5.28)
RDW: 13 % (ref 11.7–15.4)
WBC: 10.6 10*3/uL (ref 3.4–10.8)

## 2021-05-13 LAB — BASIC METABOLIC PANEL
BUN/Creatinine Ratio: 17 (ref 9–23)
BUN: 14 mg/dL (ref 6–24)
CO2: 23 mmol/L (ref 20–29)
Calcium: 9.9 mg/dL (ref 8.7–10.2)
Chloride: 98 mmol/L (ref 96–106)
Creatinine, Ser: 0.84 mg/dL (ref 0.57–1.00)
Glucose: 210 mg/dL — ABNORMAL HIGH (ref 70–99)
Potassium: 4.5 mmol/L (ref 3.5–5.2)
Sodium: 137 mmol/L (ref 134–144)
eGFR: 85 mL/min/{1.73_m2} (ref >59)

## 2021-05-13 LAB — MAGNESIUM: Magnesium: 2.1 mg/dL (ref 1.6–2.3)

## 2021-05-17 ENCOUNTER — Telehealth: Payer: Self-pay

## 2021-05-17 NOTE — Telephone Encounter (Signed)
Called patient and reviewed the following pre-cath instructions. Patient has no questions at this time.  Cardiac catheterization scheduled at [facility] for: [facility] Main Entrance A [facility]) at: 0930  Diet-no solid food after midnight prior to cath, clear liquids until 5 AM day of procedure.  Medication instructions for procedure:  -Usual morning medications can be taken pre-cath with sips of water including aspirin 81 mg. -Hold lasix the morning of the procedure -Hold Jardiance and Metformin the morning of the procedure    Confirmed patient has responsible adult to drive home post procedure and be with patient first 24 hours after arriving home.  [facility] does allow one visitor to accompany you and wait in the hospital waiting room while you are there for your procedure.  You and your visitor will be asked to wear a mask once you enter the hospital.  Patient reports does not currently have any new symptoms concerning for COVID-19 and no household members with COVID-19 like illness.

## 2021-05-18 ENCOUNTER — Ambulatory Visit (HOSPITAL_COMMUNITY)
Admission: RE | Admit: 2021-05-18 | Discharge: 2021-05-18 | Disposition: A | Discharge status: Home / Self Care | Payer: Managed Care, Other (non HMO) | Source: Ambulatory Visit | Attending: Cardiovascular Disease | Admitting: Cardiovascular Disease

## 2021-05-18 ENCOUNTER — Encounter (HOSPITAL_COMMUNITY)
Admission: RE | Disposition: A | Discharge status: Home / Self Care | Payer: Self-pay | Source: Ambulatory Visit | Attending: Cardiovascular Disease

## 2021-05-18 DIAGNOSIS — Z6838 Body mass index (BMI) 38.0-38.9, adult: Secondary | ICD-10-CM | POA: Insufficient documentation

## 2021-05-18 DIAGNOSIS — E119 Type 2 diabetes mellitus without complications: Secondary | ICD-10-CM | POA: Diagnosis not present

## 2021-05-18 DIAGNOSIS — I110 Hypertensive heart disease with heart failure: Secondary | ICD-10-CM | POA: Diagnosis not present

## 2021-05-18 DIAGNOSIS — I428 Other cardiomyopathies: Secondary | ICD-10-CM | POA: Diagnosis not present

## 2021-05-18 DIAGNOSIS — E782 Mixed hyperlipidemia: Secondary | ICD-10-CM | POA: Insufficient documentation

## 2021-05-18 DIAGNOSIS — R0602 Shortness of breath: Secondary | ICD-10-CM | POA: Diagnosis present

## 2021-05-18 DIAGNOSIS — K219 Gastro-esophageal reflux disease without esophagitis: Secondary | ICD-10-CM | POA: Insufficient documentation

## 2021-05-18 DIAGNOSIS — I509 Heart failure, unspecified: Secondary | ICD-10-CM | POA: Insufficient documentation

## 2021-05-18 DIAGNOSIS — Z7984 Long term (current) use of oral hypoglycemic drugs: Secondary | ICD-10-CM | POA: Insufficient documentation

## 2021-05-18 HISTORY — PX: RIGHT/LEFT HEART CATH AND CORONARY ANGIOGRAPHY: CATH118266

## 2021-05-18 LAB — POCT I-STAT EG7
Acid-Base Excess: 1 mmol/L (ref 0.0–2.0)
Bicarbonate: 26.9 mmol/L (ref 20.0–28.0)
Calcium, Ion: 1.15 mmol/L (ref 1.15–1.40)
HCT: 41 % (ref 36.0–46.0)
Hemoglobin: 13.9 g/dL (ref 12.0–15.0)
O2 Saturation: 67 %
Potassium: 3.7 mmol/L (ref 3.5–5.1)
Sodium: 141 mmol/L (ref 135–145)
TCO2: 28 mmol/L (ref 22–32)
pCO2, Ven: 47.2 mmHg (ref 44.0–60.0)
pH, Ven: 7.363 (ref 7.250–7.430)
pO2, Ven: 36 mmHg (ref 32.0–45.0)

## 2021-05-18 LAB — POCT I-STAT 7, (LYTES, BLD GAS, ICA,H+H)
Acid-Base Excess: 0 mmol/L (ref 0.0–2.0)
Bicarbonate: 25.6 mmol/L (ref 20.0–28.0)
Calcium, Ion: 1.16 mmol/L (ref 1.15–1.40)
HCT: 40 % (ref 36.0–46.0)
Hemoglobin: 13.6 g/dL (ref 12.0–15.0)
O2 Saturation: 97 %
Potassium: 3.7 mmol/L (ref 3.5–5.1)
Sodium: 141 mmol/L (ref 135–145)
TCO2: 27 mmol/L (ref 22–32)
pCO2 arterial: 42.5 mmHg (ref 32.0–48.0)
pH, Arterial: 7.388 (ref 7.350–7.450)
pO2, Arterial: 88 mmHg (ref 83.0–108.0)

## 2021-05-18 LAB — GLUCOSE, CAPILLARY
Glucose-Capillary: 208 mg/dL — ABNORMAL HIGH (ref 70–99)
Glucose-Capillary: 163 mg/dL — ABNORMAL HIGH (ref 70–99)

## 2021-05-18 LAB — PREGNANCY, URINE: Preg Test, Ur: NEGATIVE

## 2021-05-18 SURGERY — RIGHT/LEFT HEART CATH AND CORONARY ANGIOGRAPHY
Anesthesia: LOCAL

## 2021-05-18 MED ORDER — SODIUM CHLORIDE 0.9 % IV SOLN: INTRAVENOUS | Status: AC

## 2021-05-18 MED ORDER — ONDANSETRON HCL 4 MG/2ML IJ SOLN: 4.0000 mg | Freq: Four times a day (QID) | INTRAMUSCULAR | Status: DC | PRN

## 2021-05-18 MED ORDER — ASPIRIN 81 MG PO CHEW
CHEWABLE_TABLET | ORAL | Status: AC
  Filled 2021-05-18: qty 1

## 2021-05-18 MED ORDER — SODIUM CHLORIDE 0.9% FLUSH: 3.0000 mL | Freq: Two times a day (BID) | INTRAVENOUS | Status: DC

## 2021-05-18 MED ORDER — LIDOCAINE HCL (PF) 1 % IJ SOLN
INTRAMUSCULAR | Status: AC
  Filled 2021-05-18: qty 30

## 2021-05-18 MED ORDER — HYDRALAZINE HCL 20 MG/ML IJ SOLN: 10.0000 mg | INTRAMUSCULAR | Status: DC | PRN

## 2021-05-18 MED ORDER — HEPARIN SODIUM (PORCINE) 1000 UNIT/ML IJ SOLN
INTRAMUSCULAR | Status: AC
  Filled 2021-05-18: qty 10

## 2021-05-18 MED ORDER — VERAPAMIL HCL 2.5 MG/ML IV SOLN
INTRAVENOUS | Status: AC
  Filled 2021-05-18: qty 2

## 2021-05-18 MED ORDER — FENTANYL CITRATE (PF) 100 MCG/2ML IJ SOLN
INTRAMUSCULAR | Status: DC | PRN
  Administered 2021-05-18: 25 ug via INTRAVENOUS

## 2021-05-18 MED ORDER — FENTANYL CITRATE (PF) 100 MCG/2ML IJ SOLN
INTRAMUSCULAR | Status: AC
  Filled 2021-05-18: qty 2

## 2021-05-18 MED ORDER — SODIUM CHLORIDE 0.9 % IV SOLN: 250.0000 mL | INTRAVENOUS | Status: DC | PRN

## 2021-05-18 MED ORDER — LIDOCAINE HCL (PF) 1 % IJ SOLN
INTRAMUSCULAR | Status: DC | PRN
  Administered 2021-05-18 (×2): 2 mL via INTRADERMAL

## 2021-05-18 MED ORDER — ASPIRIN 81 MG PO CHEW
81.0000 mg | CHEWABLE_TABLET | ORAL | Status: AC
  Administered 2021-05-18: 10:00:00 81 mg via ORAL

## 2021-05-18 MED ORDER — HEPARIN SODIUM (PORCINE) 1000 UNIT/ML IJ SOLN
INTRAMUSCULAR | Status: DC | PRN
  Administered 2021-05-18: 5000 [IU] via INTRAVENOUS

## 2021-05-18 MED ORDER — HEPARIN (PORCINE) IN NACL 1000-0.9 UT/500ML-% IV SOLN
INTRAVENOUS | Status: DC | PRN
  Administered 2021-05-18 (×2): 500 mL

## 2021-05-18 MED ORDER — ACETAMINOPHEN 325 MG PO TABS: 650.0000 mg | ORAL_TABLET | ORAL | Status: DC | PRN

## 2021-05-18 MED ORDER — SODIUM CHLORIDE 0.9 % IV SOLN: INTRAVENOUS | Status: DC

## 2021-05-18 MED ORDER — VERAPAMIL HCL 2.5 MG/ML IV SOLN
INTRAVENOUS | Status: DC | PRN
  Administered 2021-05-18: 12:00:00 10 mL via INTRA_ARTERIAL

## 2021-05-18 MED ORDER — MIDAZOLAM HCL 2 MG/2ML IJ SOLN
INTRAMUSCULAR | Status: DC | PRN
  Administered 2021-05-18: 1 mg via INTRAVENOUS

## 2021-05-18 MED ORDER — SODIUM CHLORIDE 0.9% FLUSH: 3.0000 mL | INTRAVENOUS | Status: DC | PRN

## 2021-05-18 MED ORDER — IOHEXOL 350 MG/ML SOLN
INTRAVENOUS | Status: DC | PRN
  Administered 2021-05-18: 12:00:00 35 mL

## 2021-05-18 MED ORDER — HEPARIN (PORCINE) IN NACL 1000-0.9 UT/500ML-% IV SOLN
INTRAVENOUS | Status: AC
  Filled 2021-05-18: qty 1000

## 2021-05-18 MED ORDER — MIDAZOLAM HCL 2 MG/2ML IJ SOLN
INTRAMUSCULAR | Status: AC
  Filled 2021-05-18: qty 2

## 2021-05-18 MED ORDER — LABETALOL HCL 5 MG/ML IV SOLN: 10.0000 mg | INTRAVENOUS | Status: DC | PRN

## 2021-05-18 SURGICAL SUPPLY — 12 items
CATH BALLN WEDGE 5F 110CM (CATHETERS) ×2 IMPLANT
CATH INFINITI 5 FR JL3.5 (CATHETERS) ×2 IMPLANT
CATH INFINITI JR4 5F (CATHETERS) ×2 IMPLANT
DEVICE RAD COMP TR BAND LRG (VASCULAR PRODUCTS) ×2 IMPLANT
GLIDESHEATH SLEND SS 6F .021 (SHEATH) ×2 IMPLANT
GUIDEWIRE INQWIRE 1.5J.035X260 (WIRE) IMPLANT
INQWIRE 1.5J .035X260CM (WIRE) ×3
KIT HEART LEFT (KITS) ×3 IMPLANT
PACK CARDIAC CATHETERIZATION (CUSTOM PROCEDURE TRAY) ×3 IMPLANT
SHEATH GLIDE SLENDER 4/5FR (SHEATH) ×2 IMPLANT
TRANSDUCER W/STOPCOCK (MISCELLANEOUS) ×3 IMPLANT
TUBING CIL FLEX 10 FLL-RA (TUBING) ×3 IMPLANT

## 2021-05-18 NOTE — Discharge Instructions (Addendum)
Hold metformin for 48 hours post cath.    Radial Site Care  This sheet gives you information about how to care for yourself after your procedure. Your health care provider may also give you more specific instructions. If you have problems or questions, contact your health care provider. What can I expect after the procedure? After the procedure, it is common to have: Bruising and tenderness at the catheter insertion area. Follow these instructions at home: Medicines Take over-the-counter and prescription medicines only as told by your health care provider. Insertion site care Follow instructions from your health care provider about how to take care of your insertion site. Make sure you: Wash your hands with soap and water before you remove your bandage (dressing). If soap and water are not available, use hand sanitizer. May remove dressing in 24 hours. Check your insertion site every day for signs of infection. Check for: Redness, swelling, or pain. Fluid or blood. Pus or a bad smell. Warmth. Do no take baths, swim, or use a hot tub for 5 days. You may shower 24-48 hours after the procedure. Remove the dressing and gently wash the site with plain soap and water. Pat the area dry with a clean towel. Do not rub the site. That could cause bleeding. Do not apply powder or lotion to the site. Activity  For 24 hours after the procedure, or as directed by your health care provider: Do not flex or bend the affected arm. Do not push or pull heavy objects with the affected arm. Do not drive yourself home from the hospital or clinic. You may drive 24 hours after the procedure. Do not operate machinery or power tools. KEEP ARM ELEVATED THE REMAINDER OF THE DAY. Do not push, pull or lift anything that is heavier than 10 lb for 5 days. Ask your health care provider when it is okay to: Return to work or school. Resume usual physical activities or sports. Resume sexual activity. General  instructions If the catheter site starts to bleed, raise your arm and put firm pressure on the site. If the bleeding does not stop, get help right away. This is a medical emergency. DRINK PLENTY OF FLUIDS FOR THE NEXT 2-3 DAYS. No alcohol consumption for 24 hours after receiving sedation. If you went home on the same day as your procedure, a responsible adult should be with you for the first 24 hours after you arrive home. Keep all follow-up visits as told by your health care provider. This is important. Contact a health care provider if: You have a fever. You have redness, swelling, or yellow drainage around your insertion site. Get help right away if: You have unusual pain at the radial site. The catheter insertion area swells very fast. The insertion area is bleeding, and the bleeding does not stop when you hold steady pressure on the area. Your arm or hand becomes pale, cool, tingly, or numb. These symptoms may represent a serious problem that is an emergency. Do not wait to see if the symptoms will go away. Get medical help right away. Call your local emergency services (911 in the [place] Do not drive yourself to the hospital. Summary After the procedure, it is common to have bruising and tenderness at the site. Follow instructions from your health care provider about how to take care of your radial site wound. Check the wound every day for signs of infection.  This information is not intended to replace advice given to you by your health care  provider. Make sure you discuss any questions you have with your health care provider. Document Revised: 06/13/2017 Document Reviewed: 06/13/2017 Elsevier Patient Education  2020 [employer].

## 2021-05-18 NOTE — Interval H&P Note (Signed)
History and Physical Interval Note:  05/18/2021 11:06 AM  Carolyn Salazar  has presented today for surgery, with the diagnosis of shortness of breath.  The various methods of treatment have been discussed with the patient and family. After consideration of risks, benefits and other options for treatment, the patient has consented to  Procedure(s): RIGHT/LEFT HEART CATH AND CORONARY ANGIOGRAPHY (N/A) as a surgical intervention.  The patient's history has been reviewed, patient examined, no change in status, stable for surgery.  I have reviewed the patient's chart and labs.  Questions were answered to the patient's satisfaction.    Cath Lab Visit (complete for each Cath Lab visit)  Clinical Evaluation Leading to the Procedure:   ACS: No.  Non-ACS:    Anginal Classification: CCS II  Anti-ischemic medical therapy: Minimal Therapy (1 class of medications)  Non-Invasive Test Results: No non-invasive testing performed  Prior CABG: No previous CABG        Lauree Chandler

## 2021-05-19 ENCOUNTER — Encounter (HOSPITAL_COMMUNITY): Payer: Self-pay | Admitting: Cardiovascular Disease

## 2021-05-30 ENCOUNTER — Other Ambulatory Visit: Payer: Self-pay | Admitting: Family Medicine

## 2021-05-30 DIAGNOSIS — E118 Type 2 diabetes mellitus with unspecified complications: Secondary | ICD-10-CM

## 2021-06-01 ENCOUNTER — Other Ambulatory Visit: Payer: Self-pay | Admitting: Family Medicine

## 2021-06-01 DIAGNOSIS — E118 Type 2 diabetes mellitus with unspecified complications: Secondary | ICD-10-CM

## 2021-06-02 ENCOUNTER — Other Ambulatory Visit: Payer: Self-pay | Admitting: *Deleted

## 2021-06-02 DIAGNOSIS — E118 Type 2 diabetes mellitus with unspecified complications: Secondary | ICD-10-CM

## 2021-06-02 MED ORDER — EMPAGLIFLOZIN 10 MG PO TABS: 10.0000 mg | ORAL_TABLET | Freq: Every day | ORAL | 0 refills | Status: DC

## 2021-06-06 ENCOUNTER — Other Ambulatory Visit: Payer: Self-pay

## 2021-06-06 ENCOUNTER — Other Ambulatory Visit: Payer: Managed Care, Other (non HMO)

## 2021-06-06 DIAGNOSIS — E782 Mixed hyperlipidemia: Secondary | ICD-10-CM

## 2021-06-06 DIAGNOSIS — Z5181 Encounter for therapeutic drug level monitoring: Secondary | ICD-10-CM

## 2021-06-06 DIAGNOSIS — E118 Type 2 diabetes mellitus with unspecified complications: Secondary | ICD-10-CM

## 2021-06-06 DIAGNOSIS — E559 Vitamin D deficiency, unspecified: Secondary | ICD-10-CM

## 2021-06-07 ENCOUNTER — Ambulatory Visit (INDEPENDENT_AMBULATORY_CARE_PROVIDER_SITE_OTHER): Payer: Managed Care, Other (non HMO) | Admitting: Cardiology

## 2021-06-07 ENCOUNTER — Encounter: Payer: Self-pay | Admitting: Cardiology

## 2021-06-07 VITALS — BP 128/66 | HR 90 | Ht 66.0 in | Wt 242.0 lb

## 2021-06-07 DIAGNOSIS — I429 Cardiomyopathy, unspecified: Secondary | ICD-10-CM

## 2021-06-07 DIAGNOSIS — E118 Type 2 diabetes mellitus with unspecified complications: Secondary | ICD-10-CM | POA: Diagnosis not present

## 2021-06-07 DIAGNOSIS — I428 Other cardiomyopathies: Secondary | ICD-10-CM

## 2021-06-07 DIAGNOSIS — E782 Mixed hyperlipidemia: Secondary | ICD-10-CM | POA: Diagnosis not present

## 2021-06-07 DIAGNOSIS — E669 Obesity, unspecified: Secondary | ICD-10-CM

## 2021-06-07 LAB — HEMOGLOBIN A1C
Est. average glucose Bld gHb Est-mCnc: 166 mg/dL
Hgb A1c MFr Bld: 7.4 % — ABNORMAL HIGH (ref 4.8–5.6)

## 2021-06-07 LAB — LIPID PANEL
Chol/HDL Ratio: 3.6 ratio (ref 0.0–4.4)
Cholesterol, Total: 170 mg/dL (ref 100–199)
HDL: 47 mg/dL (ref >39)
LDL Chol Calc (NIH): 88 mg/dL (ref 0–99)
Triglycerides: 206 mg/dL — ABNORMAL HIGH (ref 0–149)
VLDL Cholesterol Cal: 35 mg/dL (ref 5–40)

## 2021-06-07 LAB — GLUCOSE, RANDOM: Glucose: 177 mg/dL — ABNORMAL HIGH (ref 70–99)

## 2021-06-07 LAB — MICROALBUMIN / CREATININE URINE RATIO
Creatinine, Urine: 100.9 mg/dL
Microalb/Creat Ratio: 16 mg/g creat (ref 0–29)
Microalbumin, Urine: 16.3 ug/mL

## 2021-06-07 LAB — VITAMIN D 25 HYDROXY (VIT D DEFICIENCY, FRACTURES): Vit D, 25-Hydroxy: 58.8 ng/mL (ref 30.0–100.0)

## 2021-06-07 LAB — TSH: TSH: 2.9 u[IU]/mL (ref 0.450–4.500)

## 2021-06-07 MED ORDER — SPIRONOLACTONE 25 MG PO TABS: 12.5000 mg | ORAL_TABLET | Freq: Every day | ORAL | 3 refills | Status: DC

## 2021-06-07 NOTE — Patient Instructions (Signed)
Medication Instructions:   -Start spironolactone (aldactone) 12.61m once daily.  *If you need a refill on your cardiac medications before your next appointment, please call your pharmacy*   Lab Work: Your physician recommends that you have labs drawn today: BMET and magnesium  If you have labs (blood work) drawn today and your tests are completely normal, you will receive your results only by: [facility](if you have MyChart) OR A paper copy in the mail If you have any lab test that is abnormal or we need to change your treatment, we will call you to review the results.   Follow-Up: [facility] you and your health needs are our priority.  As part of our continuing mission to provide you with exceptional heart care, we have created designated Provider Care Teams.  These Care Teams include your primary Cardiologist (physician) and Advanced Practice Providers (APPs -  Physician Assistants and Nurse Practitioners) who all work together to provide you with the care you need, when you need it.  We recommend signing up for the patient portal called "MyChart".  Sign up information is provided on this After Visit Summary.  MyChart is used to connect with patients for Virtual Visits (Telemedicine).  Patients are able to view lab/test results, encounter notes, upcoming appointments, etc.  Non-urgent messages can be sent to your provider as well.   To learn more about what you can do with MyChart, go to [email or web address]    Your next appointment:   8 week(s)  The format for your next appointment:   Virtual Visit   Provider:   KBerniece Salines MD

## 2021-06-07 NOTE — Progress Notes (Signed)
Cardiology Office Note:    Date:  06/07/2021   ID:  Carolyn Salazar, DOB [birth year], MRN [id number]  PCP:  Rita Ohara, MD  Cardiologist:  Skeet Latch, MD  Electrophysiologist:  None   Referring MD: Rita Ohara, MD   Chief Complaint  Patient presents with   Follow-up    2-4 weeks.   Shortness of Breath    History of Present Illness:    Carolyn Salazar is a 51 y.o. female with a hx of nonischemic cardiomyopathy which based on chart review going back to 2000 she had a ejection fraction of 20%, Catheterization was done then when she lived in [place], revealed normal coronaries.  In 2017 she had some chest pain.  Nuclear stress test was abnormal.  Diagnostic catheterization in March 2017 showed no significant coronary disease.  Her ejection fraction then was 45 to 50%, diabetes mellitus on metformin and Jardiance, hypertension, GERD.   I saw the patient on April 04, 2021 at that time she was present significant shortness of breath on exertion.  And daytime somnolence.  With this I recommend we get a echocardiogram to reassess her LV function.  We also talked about the benefits of sleep study at that time.  Data vitamin D level and the patient I refilled her Lasix.  I saw the patient on May 07, 2019 to discuss her echocardiogram result.  Prior to her visit I had transition the patient to [facility].  Given her echocardiogram results and the patient for irregular heart catheterization given the fact that she was still short of breath.  She had her heart catheterization which did not show any evidence of coronary artery disease.  She still is experiencing shortness of breath.  Past Medical History:  Diagnosis Date   Allergy    Arthritis    Hands, Knees RT>LT   BENIGN NEOPLASM OF SKIN SITE UNSPECIFIED    benign mole   Blood transfusion without reported diagnosis    BUNIONS, BILATERAL    CARDIOMYOPATHY 02/1999   EF 20% in 02/1999, improved over time-  EF 40-45% at cath 2017    Chest pain    normal coronaries 2000 and 2017 (after an abnormal Myoview)   CHF (congestive heart failure) (HCC)    DEPRESSION    DIABETES MELLITUS, TYPE II, CONTROLLED, MILD    DYSLIPIDEMIA    GERD (gastroesophageal reflux disease)    Hypotension    November, 3734   METABOLIC SYNDROME X    hypertriglycerides 04/2008, hyperglycemia   MIGRAINE HEADACHE    NASH (nonalcoholic steatohepatitis)    OBESITY    Shingles (herpes zoster) polyneuropathy    Sinus tachycardia     Past Surgical History:  Procedure Laterality Date   BUNIONECTOMY  2012   LEFT   BUNIONECTOMY Right    CARDIAC CATHETERIZATION  2000   no CAD   CARDIAC CATHETERIZATION N/A 08/25/2015   Procedure: Left Heart Cath and Coronary Angiography;  Surgeon: Troy Sine, MD;  Location: [place] [facility];  Service: Cardiovascular;  Laterality: N/A;   heart biopsy     mole removed     right arm age 22   RIGHT/LEFT HEART CATH AND CORONARY ANGIOGRAPHY N/A 05/18/2021   Procedure: RIGHT/LEFT HEART CATH AND CORONARY ANGIOGRAPHY;  Surgeon: Burnell Blanks, MD;  Location: [place] [facility];  Service: Cardiovascular;  Laterality: N/A;   vein scope      Current Medications: Current Meds  Medication Sig   ARIPiprazole (ABILIFY) 15 MG tablet Take 15 mg  by mouth daily.   aspirin-acetaminophen-caffeine (EXCEDRIN MIGRAINE) 250-250-65 MG tablet Take 3 tablets by mouth daily as needed for headache.   atorvastatin (LIPITOR) 10 MG tablet TAKE 1 TABLET BY MOUTH EVERY DAY   b complex vitamins tablet Take 1 tablet by mouth daily.   carvedilol (COREG) 25 MG tablet TAKE 1 TABLET (25 MG TOTAL) BY MOUTH 2 (TWO) TIMES DAILY WITH A MEAL. KEEP FOLLOW UP APPOINTMENT FOR FUTURE REFILLS.   cetirizine (ZYRTEC) 10 MG tablet Take 10 mg by mouth daily.     Cholecalciferol (DIALYVITE VITAMIN D 5000) 125 MCG (5000 UT) capsule Take 5,000 Units by mouth 2 (two) times daily.   Continuous Blood Gluc Sensor (FREESTYLE LIBRE 3 SENSOR) MISC 1 each by  Does not apply route every 14 (fourteen) days.   diclofenac Sodium (VOLTAREN) 1 % GEL Apply 1 application topically daily as needed (pain).   empagliflozin (JARDIANCE) 10 MG TABS tablet Take 1 tablet (10 mg total) by mouth daily.   esomeprazole (NEXIUM) 20 MG capsule Take 20 mg by mouth daily.   Eszopiclone 3 MG TABS Take 3 mg by mouth at bedtime.   FLUoxetine (PROZAC) 40 MG capsule Take 80 mg by mouth daily.   furosemide (LASIX) 40 MG tablet Take 1 tablet (40 mg total) by mouth daily.   Glucose Blood (BLOOD GLUCOSE TEST STRIPS) STRP Test 1-2 times a day. Pt uses onetouch verio flex meter. Dx e11.9   glucose blood test strip Test once a day. Pt uses one touch verio meter   hydrocortisone (ANUSOL-HC) 25 MG suppository Place 1 suppository (25 mg total) rectally daily. One suppository (25 mg) per rectum daily at bedtime for 10 days.   KLOR-CON M20 20 MEQ tablet TAKE 1 TABLET BY MOUTH EVERY DAY   lamoTRIgine (LAMICTAL) 200 MG tablet Take 400 mg by mouth daily.   Lancets (ONETOUCH DELICA PLUS ZOXWRU04V) MISC Test 1-2 times a day. Pt uses one touch verio flex meter dx e11.9   metFORMIN (GLUCOPHAGE) 1000 MG tablet Take 1 tablet (1,000 mg total) by mouth 2 (two) times daily with a meal.   Misc Natural Products (GLUCOSAMINE CHONDROITIN TRIPLE) TABS Take 1 tablet by mouth daily.   Omega-3 Fatty Acids (FISH OIL) 1000 MG CAPS Take 3,000 mg by mouth daily.   Probiotic Product (PROBIOTIC PO) Take 2 capsules by mouth daily.   sacubitril-valsartan (ENTRESTO) 24-26 MG Take 1 tablet by mouth 2 (two) times daily.   spironolactone (ALDACTONE) 25 MG tablet Take 0.5 tablets (12.5 mg total) by mouth daily.   SUMAtriptan (IMITREX) 100 MG tablet TAKE 1 TABLET EVERY 2 HOURS AS NEEDED FOR MIGRAINE (NO MORE THAN 2 TABLETS IN A 24 HOUR PERIOD) MAY REPEAT IN 2 HOURS IF HEADACHE PERSISTS OR RECURS   terconazole (TERAZOL 3) 0.8 % vaginal cream USE 1 APPLICATOR VAGINALLY FOR 3 DAYS (Patient taking differently: USE 1 APPLICATOR  VAGINALLY FOR 3 DAYS AS NEEDED)   Current Facility-Administered Medications for the 06/07/21 encounter (Office Visit) with Berniece Salines, DO  Medication   levonorgestrel (MIRENA) 20 MCG/24HR IUD     Allergies:   Sulfonamide derivatives   Social History   Socioeconomic History   Marital status: Married    Spouse name: Not on file   Number of children: Not on file   Years of education: Not on file   Highest education level: Not on file  Occupational History   Not on file  Tobacco Use   Smoking status: Never   Smokeless tobacco: Never  Vaping  Use   Vaping Use: Never used  Substance and Sexual Activity   Alcohol use: Not Currently    Comment: 1 glass of wine every other week.    Drug use: No   Sexual activity: Not Currently    Birth control/protection: Other-see comments    Comment: TUBAL LIGATION, 1st intercourse- 20, partners- 3  Other Topics Concern   Not on file  Social History Narrative   Pt lives with her spouse, daughter and college friend.       Epworth Sleepiness Scale = 10 (as of 07/21/2015)   Social Determinants of Health   Financial Resource Strain: Not on file  Food Insecurity: Not on file  Transportation Needs: Not on file  Physical Activity: Not on file  Stress: Not on file  Social Connections: Not on file     Family History: The patient's family history includes Arthritis in her sister and another family member; Cancer in her mother; Diabetes in her father and mother; Diabetes type II in her mother; Heart attack in her father; Heart failure in her father; Hyperlipidemia in her mother and another family member; Hypertension in an other family member. There is no history of Colon cancer, Colon polyps, Esophageal cancer, Rectal cancer, or Stomach cancer.  ROS:   Review of Systems  Constitution: Negative for decreased appetite, fever and weight gain.  HENT: Negative for congestion, ear discharge, hoarse voice and sore throat.   Eyes: Negative for discharge,  redness, vision loss in right eye and visual halos.  Cardiovascular: Negative for chest pain, dyspnea on exertion, leg swelling, orthopnea and palpitations.  Respiratory: Negative for cough, hemoptysis, shortness of breath and snoring.   Endocrine: Negative for heat intolerance and polyphagia.  Hematologic/Lymphatic: Negative for bleeding problem. Does not bruise/bleed easily.  Skin: Negative for flushing, nail changes, rash and suspicious lesions.  Musculoskeletal: Negative for arthritis, joint pain, muscle cramps, myalgias, neck pain and stiffness.  Gastrointestinal: Negative for abdominal pain, bowel incontinence, diarrhea and excessive appetite.  Genitourinary: Negative for decreased libido, genital sores and incomplete emptying.  Neurological: Negative for brief paralysis, focal weakness, headaches and loss of balance.  Psychiatric/Behavioral: Negative for altered mental status, depression and suicidal ideas.  Allergic/Immunologic: Negative for HIV exposure and persistent infections.    EKGs/Labs/Other Studies Reviewed:    The following studies were reviewed today:   EKG: None today  R/L heart catheterization No angiographic evidence of CAD Normal right and left heart pressures 3.   Non-ischemic cardiomyopathy   Recommendations: No further ischemic workup. Her cardiomyopathy is felt to be non-ischemic.     Transthoracic echocardiogram April 22, 2021 IMPRESSIONS     1. Left ventricular ejection fraction, by estimation, is 40%. The left  ventricle has moderately decreased function. The left ventricle  demonstrates regional wall motion abnormalities (see scoring  diagram/findings for description). There is mild  asymmetric left ventricular hypertrophy of the infero-lateral segment.  Left ventricular diastolic parameters are consistent with Grade I  diastolic dysfunction (impaired relaxation).   2. Right ventricular systolic function is normal. The right ventricular  size  is normal. Tricuspid regurgitation signal is inadequate for assessing  PA pressure.   3. The mitral valve is normal in structure. Trivial mitral valve  regurgitation. No evidence of mitral stenosis.   4. The aortic valve is tricuspid. Aortic valve regurgitation is not  visualized. No aortic stenosis is present.   5. The inferior vena cava is normal in size with greater than 50%  respiratory variability, suggesting right atrial  pressure of 3 mmHg.   FINDINGS   Left Ventricle: Left ventricular ejection fraction, by estimation, is  40%. The left ventricle has moderately decreased function. The left  ventricle demonstrates regional wall motion abnormalities. The left  ventricular internal cavity size was normal in  size. There is mild asymmetric left ventricular hypertrophy of the  infero-lateral segment. Left ventricular diastolic parameters are  consistent with Grade I diastolic dysfunction (impaired relaxation).      LV Wall Scoring:  The apical lateral segment, apical septal segment, apical anterior  segment,  and apical inferior segment are hypokinetic.   Right Ventricle: The right ventricular size is normal. No increase in  right ventricular wall thickness. Right ventricular systolic function is  normal. Tricuspid regurgitation signal is inadequate for assessing PA  pressure.   Left Atrium: Left atrial size was normal in size.   Right Atrium: Right atrial size was normal in size.   Pericardium: There is no evidence of pericardial effusion.   Mitral Valve: The mitral valve is normal in structure. Trivial mitral  valve regurgitation. No evidence of mitral valve stenosis.   Tricuspid Valve: The tricuspid valve is normal in structure. Tricuspid  valve regurgitation is trivial. No evidence of tricuspid stenosis.   Aortic Valve: The aortic valve is tricuspid. Aortic valve regurgitation is  not visualized. No aortic stenosis is present.   Pulmonic Valve: The pulmonic valve was  normal in structure. Pulmonic valve  regurgitation is trivial. No evidence of pulmonic stenosis.   Aorta: The aortic root is normal in size and structure.   Venous: The inferior vena cava is normal in size with greater than 50%  respiratory variability, suggesting right atrial pressure of 3 mmHg.   IAS/Shunts: The interatrial septum was not well visualized    Recent Labs: 03/01/2021: ALT 22 05/13/2021: BUN 14; Creatinine, Ser 0.84; Magnesium 2.1; Platelets 396 05/18/2021: Hemoglobin 13.9; Potassium 3.7; Sodium 141 06/06/2021: TSH 2.900  Recent Lipid Panel    Component Value Date/Time   CHOL 170 06/06/2021 0951   TRIG 206 (H) 06/06/2021 0951   HDL 47 06/06/2021 0951   CHOLHDL 3.6 06/06/2021 0951   CHOLHDL 4.5 03/29/2016 0907   VLDL 78 (H) 03/29/2016 0907   LDLCALC 88 06/06/2021 0951   LDLDIRECT 130.8 01/15/2014 1209    Physical Exam:    VS:  BP 128/66 (BP Location: Left Arm, Patient Position: Sitting, Cuff Size: Large)    Pulse 90    Ht 5' 6"  (1.676 m)    Wt 242 lb (109.8 kg)    BMI 39.06 kg/m     Wt Readings from Last 3 Encounters:  06/07/21 242 lb (109.8 kg)  05/18/21 235 lb (106.6 kg)  05/06/21 241 lb (109.3 kg)     GEN: Well nourished, well developed in no acute distress HEENT: Normal NECK: No JVD; No carotid bruits LYMPHATICS: No lymphadenopathy CARDIAC: S1S2 noted,RRR, no murmurs, rubs, gallops RESPIRATORY:  Clear to auscultation without rales, wheezing or rhonchi  ABDOMEN: Soft, non-tender, non-distended, +bowel sounds, no guarding. EXTREMITIES: No edema, No cyanosis, no clubbing MUSCULOSKELETAL:  No deformity  SKIN: Warm and dry NEUROLOGIC:  Alert and oriented x 3, non-focal PSYCHIATRIC:  Normal affect, good insight  ASSESSMENT:    1. Nonischemic cardiomyopathy ([place])   2. Cardiomyopathy, unspecified type ([place])   3. Type 2 diabetes mellitus with complication, without long-term current use of insulin ([place])   4. Mixed hyperlipidemia   5. Obesity (BMI  30-39.9)    PLAN:    We  talked about the plan moving forward in terms of her nonischemic cardiomyopathy I am going to optimize her medical regimen she is currently also on Entresto 24-26 mg twice daily, carvedilol, Jardiance only that low-dose Aldactone 12.5 mg daily.  She is going to get blood work today to assess her kidneys and electrolytes.  I have asked the patient to take her blood pressure daily.  I plan to repeat her echocardiogram in 3 months optimal guideline medical directed therapy.  If she does not have any improvement after optimizing her medications.  A cardiac MRI will also be ordered.  Her shortness of breath could potentially be multifactorial given her obesity as well.  If there is no improvement in her medication another plan is to have pulmonary assess the patient as well.  The patient is in agreement with the above plan. The patient left the office in stable condition.  The patient will follow up in 8 weeks virtually   Medication Adjustments/Labs and Tests Ordered: Current medicines are reviewed at length with the patient today.  Concerns regarding medicines are outlined above.  Orders Placed This Encounter  Procedures   Basic metabolic panel   Magnesium   Meds ordered this encounter  Medications   spironolactone (ALDACTONE) 25 MG tablet    Sig: Take 0.5 tablets (12.5 mg total) by mouth daily.    Dispense:  45 tablet    Refill:  3    Patient Instructions  Medication Instructions:   -Start spironolactone (aldactone) 12.69m once daily.  *If you need a refill on your cardiac medications before your next appointment, please call your pharmacy*   Lab Work: Your physician recommends that you have labs drawn today: BMET and magnesium  If you have labs (blood work) drawn today and your tests are completely normal, you will receive your results only by: [facility](if you have MyChart) OR A paper copy in the mail If you have any lab test that is abnormal or  we need to change your treatment, we will call you to review the results.   Follow-Up: At [facility] and your health needs are our priority.  As part of our continuing mission to provide you with exceptional heart care, we have created designated Provider Care Teams.  These Care Teams include your primary Cardiologist (physician) and Advanced Practice Providers (APPs -  Physician Assistants and Nurse Practitioners) who all work together to provide you with the care you need, when you need it.  We recommend signing up for the patient portal called "MyChart".  Sign up information is provided on this After Visit Summary.  MyChart is used to connect with patients for Virtual Visits (Telemedicine).  Patients are able to view lab/test results, encounter notes, upcoming appointments, etc.  Non-urgent messages can be sent to your provider as well.   To learn more about what you can do with MyChart, go to [email or web address]    Your next appointment:   8 week(s)  The format for your next appointment:   Virtual Visit   Provider:   KBerniece Salines MD   Adopting a Healthy Lifestyle.  Know what a healthy weight is for you (roughly BMI <25) and aim to maintain this   Aim for 7+ servings of fruits and vegetables daily   65-80+ fluid ounces of water or unsweet tea for healthy kidneys   Limit to max 1 drink of alcohol per day; avoid smoking/tobacco   Limit animal fats in diet for cholesterol and heart health -  choose grass fed whenever available   Avoid highly processed foods, and foods high in saturated/trans fats   Aim for low stress - take time to unwind and care for your mental health   Aim for 150 min of moderate intensity exercise weekly for heart health, and weights twice weekly for bone health   Aim for 7-9 hours of sleep daily   When it comes to diets, agreement about the perfect plan isnt easy to find, even among the experts. Experts at the [employer]  developed an idea known as the Healthy Eating Plate. Just imagine a plate divided into logical, healthy portions.   The emphasis is on diet quality:   Load up on vegetables and fruits - one-half of your plate: Aim for color and variety, and remember that potatoes dont count.   Go for whole grains - one-quarter of your plate: Whole wheat, barley, wheat berries, quinoa, oats, brown rice, and foods made with them. If you want pasta, go with whole wheat pasta.   Protein power - one-quarter of your plate: Fish, chicken, beans, and nuts are all healthy, versatile protein sources. Limit red meat.   The diet, however, does go beyond the plate, offering a few other suggestions.   Use healthy plant oils, such as olive, canola, soy, corn, sunflower and peanut. Check the labels, and avoid partially hydrogenated oil, which have unhealthy trans fats.   If youre thirsty, drink water. Coffee and tea are good in moderation, but skip sugary drinks and limit milk and dairy products to one or two daily servings.   The type of carbohydrate in the diet is more important than the amount. Some sources of carbohydrates, such as vegetables, fruits, whole grains, and beans-are healthier than others.   Finally, stay active  Signed, Berniece Salines, DO  06/07/2021 12:50 PM    [facility]

## 2021-06-07 NOTE — Progress Notes (Signed)
l °

## 2021-06-07 NOTE — Progress Notes (Signed)
Chief Complaint  Patient presents with   Diabetes    Med check, labs already done. No new concerns.    Patient presents for 3 month follow-up.  See below for labs done prior to visit.  Diabetes:  Patient reports compliance with metformin and jardiance, and denies side effects. She is using Freestyle Libre 3 monitor to help with controlling/monitoring her sugars. Sugars have been running: Fasting: 175.  225-300 at noon (3 hours PP) 6pm 200 Bedtime 175.  Takes med with breakfast and dinner. She cut out desserts. She is no longer swimming due to shortness of breath (and doesn't plan to until 3 month follow-up with cardiologist and gets approval, after echo).    Nonischemic cardiomyopathy: She did see the cardiologist since our last visit. She had repeat echo in 04/2021, and EF was 40%.  She also had heart cath, no ischemia found.  Felt to have nonischemic cardiomyopathy.  She is currently on entresto, coreg, jardiance, and aldactone was just recently added by cardiologist. The plan is repeat echo in 3 months.  She is still having some shortness of breath, felt to be multifactorial, and obesity may be contributing.  At her last visit with me she had reported some chest pressure with going up and down the stairs, and getting winded with swimming (using kickboard, not arms). She reports that she had to stop swimming due to SOB.  Had also been having SOB with stairs, and at the grocery store. She denies headaches, dizziness, edema.    Mixed hyperlipidemia:  Patient reports compliance with atorvastatin, and denies side effects. TG were elevated at 235 in 02/2021 (LDL was 63).  She had taken 3000 mg of fish oil in Aug/Sept, then ran out, labs done in October.  She previously had taken Vascepa (05/2018). It was noted that her lipids didn't improve, and she ended up stopping Vascepa. It also was noted that she had elevated LFT's on higher statin dose in the past.  She remains on 42m of atorvastatin.  She has been taking fish oil 3000 mg daily since last visit. She is trying to follow a lowfat diet.  Eating grilled chicken when eating out, trying to eat at home more. No longer eats dessert, no alcohol, no juices.  Vitamin D deficiency:  she had increased her dose from 6000 to 10,000 at last visit, so level was rechecked to ensure that it wasn't unsafe at that dose.     PMH, PSH, SH reviewed  Outpatient Encounter Medications as of 06/08/2021  Medication Sig Note   ARIPiprazole (ABILIFY) 15 MG tablet Take 15 mg by mouth daily.    aspirin-acetaminophen-caffeine (EXCEDRIN MIGRAINE) 250-250-65 MG tablet Take 3 tablets by mouth daily as needed for headache. 06/08/2021: Last dose yesterday am   atorvastatin (LIPITOR) 10 MG tablet TAKE 1 TABLET BY MOUTH EVERY DAY    b complex vitamins tablet Take 1 tablet by mouth daily.    carvedilol (COREG) 25 MG tablet TAKE 1 TABLET (25 MG TOTAL) BY MOUTH 2 (TWO) TIMES DAILY WITH A MEAL. KEEP FOLLOW UP APPOINTMENT FOR FUTURE REFILLS.    cetirizine (ZYRTEC) 10 MG tablet Take 10 mg by mouth daily.      Cholecalciferol (DIALYVITE VITAMIN D 5000) 125 MCG (5000 UT) capsule Take 5,000 Units by mouth 2 (two) times daily.    Continuous Blood Gluc Sensor (FREESTYLE LIBRE 3 SENSOR) MISC 1 each by Does not apply route every 14 (fourteen) days.    empagliflozin (JARDIANCE) 10 MG TABS tablet Take  1 tablet (10 mg total) by mouth daily.    esomeprazole (NEXIUM) 20 MG capsule Take 20 mg by mouth daily.    Eszopiclone 3 MG TABS Take 3 mg by mouth at bedtime.    FLUoxetine (PROZAC) 40 MG capsule Take 80 mg by mouth daily.    furosemide (LASIX) 40 MG tablet Take 1 tablet (40 mg total) by mouth daily.    KLOR-CON M20 20 MEQ tablet TAKE 1 TABLET BY MOUTH EVERY DAY    lamoTRIgine (LAMICTAL) 200 MG tablet Take 400 mg by mouth daily.    metFORMIN (GLUCOPHAGE) 1000 MG tablet Take 1 tablet (1,000 mg total) by mouth 2 (two) times daily with a meal.    Misc Natural Products (GLUCOSAMINE  CHONDROITIN TRIPLE) TABS Take 1 tablet by mouth daily.    Omega-3 Fatty Acids (FISH OIL) 1000 MG CAPS Take 3,000 mg by mouth daily.    Probiotic Product (PROBIOTIC PO) Take 2 capsules by mouth daily.    sacubitril-valsartan (ENTRESTO) 24-26 MG Take 1 tablet by mouth 2 (two) times daily.    spironolactone (ALDACTONE) 25 MG tablet Take 0.5 tablets (12.5 mg total) by mouth daily.    diclofenac Sodium (VOLTAREN) 1 % GEL Apply 1 application topically daily as needed (pain). (Patient not taking: Reported on 06/08/2021) 06/08/2021: As needed   Glucose Blood (BLOOD GLUCOSE TEST STRIPS) STRP Test 1-2 times a day. Pt uses onetouch verio flex meter. Dx e11.9 (Patient not taking: Reported on 06/08/2021)    glucose blood test strip Test once a day. Pt uses one touch verio meter (Patient not taking: Reported on 06/08/2021)    Lancets (ONETOUCH DELICA PLUS [id number]) MISC Test 1-2 times a day. Pt uses one touch verio flex meter dx e11.9 (Patient not taking: Reported on 06/08/2021)    SUMAtriptan (IMITREX) 100 MG tablet TAKE 1 TABLET EVERY 2 HOURS AS NEEDED FOR MIGRAINE (NO MORE THAN 2 TABLETS IN A 24 HOUR PERIOD) MAY REPEAT IN 2 HOURS IF HEADACHE PERSISTS OR RECURS (Patient not taking: Reported on 06/08/2021) 06/08/2021: Has used one in the last month   terconazole (TERAZOL 3) 0.8 % vaginal cream USE 1 APPLICATOR VAGINALLY FOR 3 DAYS (Patient not taking: Reported on 06/08/2021) 06/08/2021: Uses as needed;used last week   [DISCONTINUED] hydrocortisone (ANUSOL-HC) 25 MG suppository Place 1 suppository (25 mg total) rectally daily. One suppository (25 mg) per rectum daily at bedtime for 10 days.    Facility-Administered Encounter Medications as of 06/08/2021  Medication   levonorgestrel (MIRENA) 20 MCG/24HR IUD   Allergies  Allergen Reactions   Sulfonamide Derivatives Swelling   ROS: no fever, chills, headaches, dizziness, URI symptoms, edema, n/v/d, urinary complaints.   No bleeding, bruising, rash.  Moods are good. CP  when off lisinopril, prior to starting Entresto, none CP since then. +persistent DOE   PHYSICAL EXAM:  BP 100/68    Pulse 84    Ht 5' 6" (1.676 m)    Wt 241 lb 9.6 oz (109.6 kg)    BMI 39.00 kg/m   Wt Readings from Last 3 Encounters:  06/08/21 241 lb 9.6 oz (109.6 kg)  06/07/21 242 lb (109.8 kg)  05/18/21 235 lb (106.6 kg)  03/07/2021 Wt 247#  Pleasant, obese female in no distress HEENT: conjunctiva and sclera are clear, EOMI, wearing mask Neck: No lymphadenopathy, thyromegaly or bruit Heart: regular rate and rhythm, no murmur Lungs: clear, no wheezing, rales, ronchi Back: no spinal or CVA tenderness Abdomen: soft, nontender, no mass Extremities: no edema Neuro: alert  and oriented, normal gait Psych: normal mood, affect  Lab Results  Component Value Date   HGBA1C 7.4 (H) 06/06/2021  This is up from 6.9% 3 months ago  Fasting glucose 177 Urine microalb/Cr ratio 16 Vitamin D-OH 58.8  Lab Results  Component Value Date   TSH 2.900 06/06/2021   Lab Results  Component Value Date   CHOL 170 06/06/2021   HDL 47 06/06/2021   LDLCALC 88 06/06/2021   LDLDIRECT 130.8 01/15/2014   TRIG 206 (H) 06/06/2021   CHOLHDL 3.6 06/06/2021   LDL 88, up from 63 in October.  She had b-met and Mg done yesterday by cardiology, which were normal (other than nonfasting glucose of 216)   ASSESSMENT/PLAN:  Type 2 diabetes mellitus with complication, without long-term current use of insulin ([place]) - suboptimally controlled. Discussed increasing Jardiance; also discussed Mounjaro as an option (for DM and wt loss) after f/u with cards in 3 mos  NICM (nonischemic cardiomyopathy) ([place]) - spironolactone just added by cardiology. Cont meds per cardiology. Still having DOE which limits her activity  Mixed hyperlipidemia - TG improved, but still elevated. Sugars too high. Cont fish oil and statin, and lowfat, low cholesterol diet  Class 2 severe obesity due to excess calories with serious  comorbidity and body mass index (BMI) of 39.0 to 39.9 in adult [facility]) - counseled re: healthy diet, portions. Exercise not an option currently. Consider Mounjaro to help with DM and wt loss  Suboptimal control of both DM and TG  Sent staff message to Dr. [name] asking about raising Jardiance dose to 57m, vs considering Mounjaro to also help with obesity (verifying safety of these meds, ?if there was a preference). [facility] and just increase the Jardiance for now. (Didn't get response yet).  We discussed diabetes education, patient declined. Her daughter and husband both have DM. Has gotten education through taking care of them.   Jardiance 239mx 3 mos. Just got refill of 1059m30d. She will double up on the 80m29mhen start the 25mg33me. Await echo results. If improved (due to cardiac med changes), but DOE persists, then add Mounjaro to help with weight loss, or add regardless if A1c remains above goal.  F/u 3 months, A1c at visit.

## 2021-06-08 ENCOUNTER — Ambulatory Visit (INDEPENDENT_AMBULATORY_CARE_PROVIDER_SITE_OTHER): Payer: Managed Care, Other (non HMO) | Admitting: Family Medicine

## 2021-06-08 ENCOUNTER — Encounter: Payer: Self-pay | Admitting: Family Medicine

## 2021-06-08 ENCOUNTER — Other Ambulatory Visit: Payer: Self-pay

## 2021-06-08 VITALS — BP 100/68 | HR 84 | Ht 66.0 in | Wt 241.6 lb

## 2021-06-08 DIAGNOSIS — Z6839 Body mass index (BMI) 39.0-39.9, adult: Secondary | ICD-10-CM

## 2021-06-08 DIAGNOSIS — E782 Mixed hyperlipidemia: Secondary | ICD-10-CM | POA: Diagnosis not present

## 2021-06-08 DIAGNOSIS — I428 Other cardiomyopathies: Secondary | ICD-10-CM

## 2021-06-08 DIAGNOSIS — E118 Type 2 diabetes mellitus with unspecified complications: Secondary | ICD-10-CM | POA: Diagnosis not present

## 2021-06-08 LAB — MAGNESIUM: Magnesium: 1.9 mg/dL (ref 1.6–2.3)

## 2021-06-08 LAB — BASIC METABOLIC PANEL
BUN/Creatinine Ratio: 15 (ref 9–23)
BUN: 11 mg/dL (ref 6–24)
CO2: 23 mmol/L (ref 20–29)
Calcium: 9.3 mg/dL (ref 8.7–10.2)
Chloride: 99 mmol/L (ref 96–106)
Creatinine, Ser: 0.74 mg/dL (ref 0.57–1.00)
Glucose: 216 mg/dL — ABNORMAL HIGH (ref 70–99)
Potassium: 3.8 mmol/L (ref 3.5–5.2)
Sodium: 141 mmol/L (ref 134–144)
eGFR: 99 mL/min/{1.73_m2} (ref >59)

## 2021-06-08 NOTE — Patient Instructions (Signed)
Take the  metformin 30 minutes prior to meals, if possible. Once I hear from the cardiologist confirming that it is fine to increase the Jardiance dose, you can double up to 65m on what you have, and I'll send in a prescription for the 291mdose (I'll send you a message when I do this).  Continue to work on portion control, healthy food choices, weight loss. Continue the 3000 mg of fish oil daily.  We discussed Mounjaro at length today.  This will be an option if needed for either better control of diabetes and/or weight loss.  We will see about this at your visit in 3 months, if needed.  Feel free to research glycemic index (specifically for fruits and other foods--it will tell you which are better than others to eat, causing less increasing in blood sugar).

## 2021-06-09 MED ORDER — EMPAGLIFLOZIN 25 MG PO TABS: 25.0000 mg | ORAL_TABLET | Freq: Every day | ORAL | 0 refills | Status: DC

## 2021-06-13 ENCOUNTER — Other Ambulatory Visit: Payer: Self-pay | Admitting: Cardiology

## 2021-06-13 DIAGNOSIS — R400 Somnolence: Secondary | ICD-10-CM

## 2021-06-13 DIAGNOSIS — I5042 Chronic combined systolic (congestive) and diastolic (congestive) heart failure: Secondary | ICD-10-CM

## 2021-06-13 NOTE — Telephone Encounter (Signed)
Split night sleep study was denied. Will submit for HST

## 2021-06-22 ENCOUNTER — Ambulatory Visit (HOSPITAL_BASED_OUTPATIENT_CLINIC_OR_DEPARTMENT_OTHER): Payer: Managed Care, Other (non HMO) | Attending: Cardiology | Admitting: Cardiovascular Disease

## 2021-06-25 ENCOUNTER — Other Ambulatory Visit: Payer: Self-pay | Admitting: Family Medicine

## 2021-06-25 DIAGNOSIS — E118 Type 2 diabetes mellitus with unspecified complications: Secondary | ICD-10-CM

## 2021-06-27 ENCOUNTER — Telehealth: Payer: Self-pay | Admitting: *Deleted

## 2021-06-27 NOTE — Telephone Encounter (Signed)
Left HST appointment details on VM.

## 2021-07-05 ENCOUNTER — Other Ambulatory Visit: Payer: Self-pay | Admitting: Family Medicine

## 2021-07-05 DIAGNOSIS — E118 Type 2 diabetes mellitus with unspecified complications: Secondary | ICD-10-CM

## 2021-07-13 ENCOUNTER — Telehealth (INDEPENDENT_AMBULATORY_CARE_PROVIDER_SITE_OTHER): Payer: Managed Care, Other (non HMO) | Admitting: Family Medicine

## 2021-07-13 ENCOUNTER — Encounter: Payer: Self-pay | Admitting: Family Medicine

## 2021-07-13 ENCOUNTER — Telehealth: Payer: Self-pay | Admitting: *Deleted

## 2021-07-13 ENCOUNTER — Other Ambulatory Visit: Payer: Self-pay

## 2021-07-13 VITALS — BP 122/88 | HR 95 | Wt 241.0 lb

## 2021-07-13 DIAGNOSIS — G43011 Migraine without aura, intractable, with status migrainosus: Secondary | ICD-10-CM

## 2021-07-13 MED ORDER — SUMATRIPTAN SUCCINATE 100 MG PO TABS: ORAL_TABLET | ORAL | 0 refills | Status: DC

## 2021-07-13 NOTE — Telephone Encounter (Signed)
Patient called to let you know that her home covid test was negative and she wanted to make sure that you are calling her imitrex.

## 2021-07-13 NOTE — Progress Notes (Signed)
° °  Subjective:    Patient ID: Natalyah Cummiskey, female    DOB: 15-Sep-1970, 51 y.o.   MRN: [id number]  HPI Documentation for virtual audio and video telecommunications through [place] encounter: The patient was located at home. 2 patient identifiers used.  The provider was located in the office. The patient did consent to this visit and is aware of possible charges through their insurance for this visit. The other persons participating in this telemedicine service were none. Time spent on call was 5 minutes and in review of previous records >20 minutes total for counseling and coordination of care. This virtual service is not related to other E/M service within previous 7 days.  She states that she has had a migraine for the last 4 days.  She has tried Imitrex on occasion without success.  She usually gets these around the time of her periods.  No fever, chills, nausea, vomiting.  Light sound and movement does bother her.  Review of Systems     Objective:   Physical Exam Alert and in no distress otherwise not examined       Assessment & Plan:   Intractable migraine without aura and with status migrainosus Recommend she go ahead and take another Imitrex right now with 800 mg of ibuprofen.  The ibuprofen can be taken 3 times per day and she can repeat the Imitrex in 2 hours.  If she still having trouble tomorrow she is to call back.  She was comfortable with that.

## 2021-07-20 ENCOUNTER — Ambulatory Visit (HOSPITAL_BASED_OUTPATIENT_CLINIC_OR_DEPARTMENT_OTHER): Payer: Managed Care, Other (non HMO) | Attending: Cardiology | Admitting: Cardiovascular Disease

## 2021-07-20 ENCOUNTER — Other Ambulatory Visit: Payer: Self-pay

## 2021-07-20 DIAGNOSIS — G4733 Obstructive sleep apnea (adult) (pediatric): Secondary | ICD-10-CM | POA: Insufficient documentation

## 2021-07-20 DIAGNOSIS — I429 Cardiomyopathy, unspecified: Secondary | ICD-10-CM

## 2021-07-20 DIAGNOSIS — I5042 Chronic combined systolic (congestive) and diastolic (congestive) heart failure: Secondary | ICD-10-CM | POA: Insufficient documentation

## 2021-07-20 DIAGNOSIS — R400 Somnolence: Secondary | ICD-10-CM | POA: Insufficient documentation

## 2021-07-25 ENCOUNTER — Other Ambulatory Visit: Payer: Self-pay | Admitting: Cardiovascular Disease

## 2021-07-25 NOTE — Telephone Encounter (Signed)
Rx(s) sent to pharmacy electronically.  

## 2021-07-29 ENCOUNTER — Encounter (HOSPITAL_BASED_OUTPATIENT_CLINIC_OR_DEPARTMENT_OTHER): Payer: Self-pay | Admitting: Cardiovascular Disease

## 2021-07-29 NOTE — Procedures (Signed)
? ? ? ? ?  Patient Name: Carolyn Salazar, Carolyn Salazar ?Study Date: 07/20/2021 ?Gender: Female ?D.O.B: Sep 20, 1970 ?Age (years): 70 ?Referring Provider: Berniece Salines DO ?Height (inches): 66 ?Interpreting Physician: Shelva Majestic MD, ABSM ?Weight (lbs): 240 ?RPSGT: Jacolyn Reedy ?BMI: 39 ?MRN: [id number] ?Neck Size: 15.00 ? ?CLINICAL INFORMATION ?Sleep Study Type: HST ? ?Indication for sleep study: snoring, cardiomyopathy, fatigue, obesity ? ?Epworth Sleepiness Score: 6 ? ?SLEEP STUDY TECHNIQUE ?A multi-channel overnight portable sleep study was performed. The channels recorded were: nasal airflow, thoracic respiratory movement, and oxygen saturation with a pulse oximetry. Snoring was also monitored. ? ?MEDICATIONS ?Patient self administered medications include: N/A. ? ?SLEEP ARCHITECTURE ?Patient was studied for 424.2 minutes. The sleep efficiency was 100.0 % and the patient was supine for 47%. The arousal index was 0.0 per hour. ? ?RESPIRATORY PARAMETERS ?The overall AHI was 18.2 per hour, with a central apnea index of 0 per hour. There is a positional component with supine sleep AHI 30.4/h versus non-supine sleep AHI 7.5/h. ? ?The oxygen nadir was 84% during sleep. ? ?CARDIAC DATA ?Mean heart rate during sleep was 91.3 bpm. ? ?IMPRESSIONS ?- Moderate obstructive sleep apnea overall (AHI 18.2/h); however, sleep apnea was severe with supine sleep (AHI 30.4/h). ?- Moderate oxygen desaturation to a nadir of 84%. ?- Patient snored for 108.1 minutes (25.5%) during the sleep. ? ?DIAGNOSIS ?- Obstructive Sleep Apnea (G47.33) ?- Nocturnal hypoxemia ? ?RECOMMENDATIONS ?- In this patient with cardiovascular co-morbidities recommend therapeutic CPAP for treatment of her sleep disordered breathing. If unable to obtain an in-lab titration, initiate Auto-PAP with EPR of 3 at 7 - 18 cm of water.  ?- Effort should be made to optimize nasal and orophayngeal patency. ?- Positional therapy avoiding supine position during sleep. ?- Avoid  alcohol, sedatives and other CNS depressants that may worsen sleep apnea and disrupt normal sleep architecture. ?- Sleep hygiene should be reviewed to assess factors that may improve sleep quality. ?- Weight management (BMI 39) and regular exercise should be initiated or continued. ?- Recommend a download and slep clinic evaluation after one month of therapy. ? ? ?[Electronically signed] 07/29/2021 10:13 AM ? ?Shelva Majestic MD, [facility], ABSM ?[place], [place] Board of Sleep Medicine ? ? ?NPI: [id number] ? ?[place] ?PH: [phone]   FX: [phone] ?ACCREDITED BY THE AMERICAN ACADEMY OF SLEEP MEDICINE ? ?

## 2021-07-30 ENCOUNTER — Other Ambulatory Visit: Payer: Self-pay | Admitting: Family Medicine

## 2021-08-01 ENCOUNTER — Other Ambulatory Visit: Payer: Self-pay | Admitting: Family Medicine

## 2021-08-01 NOTE — Telephone Encounter (Signed)
Ok to change to [facility] if okay with pt ?

## 2021-08-02 ENCOUNTER — Telehealth: Payer: Self-pay | Admitting: Family Medicine

## 2021-08-02 ENCOUNTER — Other Ambulatory Visit: Payer: Self-pay

## 2021-08-02 ENCOUNTER — Ambulatory Visit (INDEPENDENT_AMBULATORY_CARE_PROVIDER_SITE_OTHER): Payer: Managed Care, Other (non HMO) | Admitting: Cardiology

## 2021-08-02 ENCOUNTER — Encounter: Payer: Self-pay | Admitting: Cardiology

## 2021-08-02 VITALS — BP 92/74 | HR 107 | Ht 66.0 in | Wt 242.6 lb

## 2021-08-02 DIAGNOSIS — I5042 Chronic combined systolic (congestive) and diastolic (congestive) heart failure: Secondary | ICD-10-CM | POA: Diagnosis not present

## 2021-08-02 DIAGNOSIS — E118 Type 2 diabetes mellitus with unspecified complications: Secondary | ICD-10-CM

## 2021-08-02 DIAGNOSIS — I428 Other cardiomyopathies: Secondary | ICD-10-CM | POA: Diagnosis not present

## 2021-08-02 DIAGNOSIS — E669 Obesity, unspecified: Secondary | ICD-10-CM

## 2021-08-02 DIAGNOSIS — R0989 Other specified symptoms and signs involving the circulatory and respiratory systems: Secondary | ICD-10-CM

## 2021-08-02 DIAGNOSIS — E782 Mixed hyperlipidemia: Secondary | ICD-10-CM

## 2021-08-02 NOTE — Telephone Encounter (Signed)
Pt called and is requesting a refill on her dexcom sensor states the  transmitter was sent in but not the sensor was not ?[id number] [place] [place]?

## 2021-08-02 NOTE — Telephone Encounter (Signed)
[place] for this, please send ?

## 2021-08-02 NOTE — Progress Notes (Signed)
?Cardiology Office Note:   ? ?Date:  08/02/2021  ? ?ID:  Carolyn Salazar, DOB [birth year], MRN [id number] ? ?PCP:  Rita Ohara, MD  ?Cardiologist:  Berniece Salines, DO  ?Electrophysiologist:  None  ? ?Referring MD: Rita Ohara, MD  ? ?" I am having some palpitations" ? ?History of Present Illness:   ? ?Carolyn Salazar is a 51 y.o. female with a hx of nonischemic cardiomyopathy which based on chart review going back to 2000 she had a ejection fraction of 20%, Catheterization was done then when she lived in [place], revealed normal coronaries.  In 2017 she had some chest pain.  Nuclear stress test was abnormal.  Diagnostic catheterization in March 2017 showed no significant coronary disease.  Her ejection fraction then was 45 to 50%, diabetes mellitus on metformin and Jardiance, hypertension, GERD. ?  ?I saw the patient on April 04, 2021 at that time she was present significant shortness of breath on exertion.  And daytime somnolence.  With this I recommend we get a echocardiogram to reassess her LV function.  We also talked about the benefits of sleep study at that time.  Data vitamin D level and the patient I refilled her Lasix. ?  ?I saw the patient on May 07, 2019 to discuss her echocardiogram result.  Prior to her visit I had transition the patient to [facility].  Given her echocardiogram results and the patient for irregular heart catheterization given the fact that she was still short of breath. ? ?I saw the patient on June 07, 2021 at that time we discussed optimizing her current medical therapy.  I continue Entresto, carvedilol, Jardiance and added low-dose Aldactone 12.5 mg.  She is here today for follow-up visit. ? ?She tells me she has been experiencing some palpitations.  No chest pain or shortness of breath. ? ?Past Medical History:  ?Diagnosis Date  ? Allergy   ? Arthritis   ? Hands, Knees RT>LT  ? BENIGN NEOPLASM OF SKIN SITE UNSPECIFIED   ? benign mole  ? Blood transfusion without reported diagnosis    ? BUNIONS, BILATERAL   ? CARDIOMYOPATHY 02/1999  ? EF 20% in 02/1999, improved over time-  EF 40-45% at cath 2017  ? Chest pain   ? normal coronaries 2000 and 2017 (after an abnormal Myoview)  ? CHF (congestive heart failure) ([place])   ? DEPRESSION   ? DIABETES MELLITUS, TYPE II, CONTROLLED, MILD   ? DYSLIPIDEMIA   ? GERD (gastroesophageal reflux disease)   ? Hypotension   ? November, 2012  ? METABOLIC SYNDROME X   ? hypertriglycerides 04/2008, hyperglycemia  ? MIGRAINE HEADACHE   ? NASH (nonalcoholic steatohepatitis)   ? OBESITY   ? Shingles (herpes zoster) polyneuropathy   ? Sinus tachycardia   ? ? ?Past Surgical History:  ?Procedure Laterality Date  ? BUNIONECTOMY  2012  ? LEFT  ? BUNIONECTOMY Right   ? CARDIAC CATHETERIZATION  2000  ? no CAD  ? CARDIAC CATHETERIZATION N/A 08/25/2015  ? Procedure: Left Heart Cath and Coronary Angiography;  Surgeon: Troy Sine, MD;  Location: [facility];  Service: Cardiovascular;  Laterality: N/A;  ? heart biopsy    ? mole removed    ? right arm age 28  ? RIGHT/LEFT HEART CATH AND CORONARY ANGIOGRAPHY N/A 05/18/2021  ? Procedure: RIGHT/LEFT HEART CATH AND CORONARY ANGIOGRAPHY;  Surgeon: Burnell Blanks, MD;  Location: [facility];  Service: Cardiovascular;  Laterality: N/A;  ? vein scope    ? ? ?  Current Medications: ?Current Meds  ?Medication Sig  ? ARIPiprazole (ABILIFY) 15 MG tablet Take 15 mg by mouth daily.  ? aspirin-acetaminophen-caffeine (EXCEDRIN MIGRAINE) 250-250-65 MG tablet Take 3 tablets by mouth daily as needed for headache.  ? atorvastatin (LIPITOR) 10 MG tablet TAKE 1 TABLET BY MOUTH EVERY DAY  ? b complex vitamins tablet Take 1 tablet by mouth daily.  ? carvedilol (COREG) 25 MG tablet TAKE 1 TABLET (25 MG TOTAL) BY MOUTH 2 (TWO) TIMES DAILY WITH A MEAL. KEEP FOLLOW UP APPOINTMENT FOR FUTURE REFILLS.  ? cetirizine (ZYRTEC) 10 MG tablet Take 10 mg by mouth daily.    ? Cholecalciferol (DIALYVITE VITAMIN D 5000) 125 MCG (5000 UT) capsule Take  5,000 Units by mouth daily.  ? diclofenac Sodium (VOLTAREN) 1 % GEL Apply 1 application. topically daily as needed (pain).  ? empagliflozin (JARDIANCE) 25 MG TABS tablet Take 1 tablet (25 mg total) by mouth daily.  ? esomeprazole (NEXIUM) 20 MG capsule Take 20 mg by mouth daily.  ? Eszopiclone 3 MG TABS Take 3 mg by mouth at bedtime.  ? FLUoxetine (PROZAC) 40 MG capsule Take 80 mg by mouth daily.  ? furosemide (LASIX) 40 MG tablet Take 1 tablet (40 mg total) by mouth daily.  ? KLOR-CON M20 20 MEQ tablet TAKE 1 TABLET BY MOUTH EVERY DAY  ? lamoTRIgine (LAMICTAL) 200 MG tablet Take 400 mg by mouth daily.  ? metFORMIN (GLUCOPHAGE) 1000 MG tablet TAKE 1 TABLET (1,000 MG TOTAL) BY MOUTH 2 (TWO) TIMES DAILY WITH A MEAL.  ? Misc Natural Products (GLUCOSAMINE CHONDROITIN TRIPLE) TABS Take 1 tablet by mouth daily.  ? Omega-3 Fatty Acids (FISH OIL) 1000 MG CAPS Take 3,000 mg by mouth daily.  ? Probiotic Product (PROBIOTIC PO) Take 2 capsules by mouth daily.  ? sacubitril-valsartan (ENTRESTO) 24-26 MG Take 1 tablet by mouth 2 (two) times daily.  ? spironolactone (ALDACTONE) 25 MG tablet Take 0.5 tablets (12.5 mg total) by mouth daily.  ? SUMAtriptan (IMITREX) 100 MG tablet TAKE 1 TABLET EVERY 2 HOURS AS NEEDED FOR MIGRAINE (NO MORE THAN 2 TABLETS IN A 24 HOUR PERIOD) MAY REPEAT IN 2 HOURS IF HEADACHE PERSISTS OR RECURS  ? terconazole (TERAZOL 3) 0.8 % vaginal cream USE 1 APPLICATOR VAGINALLY FOR 3 DAYS  ? ?Current Facility-Administered Medications for the 08/02/21 encounter (Office Visit) with Berniece Salines, DO  ?Medication  ? levonorgestrel (MIRENA) 20 MCG/24HR IUD  ?  ? ?Allergies:   Sulfonamide derivatives  ? ?Social History  ? ?Socioeconomic History  ? Marital status: Married  ?  Spouse name: Not on file  ? Number of children: Not on file  ? Years of education: Not on file  ? Highest education level: Not on file  ?Occupational History  ? Not on file  ?Tobacco Use  ? Smoking status: Never  ? Smokeless tobacco: Never  ?Vaping  Use  ? Vaping Use: Never used  ?Substance and Sexual Activity  ? Alcohol use: Not Currently  ?  Comment: 1 glass of wine every other week.   ? Drug use: No  ? Sexual activity: Not Currently  ?  Birth control/protection: Other-see comments  ?  Comment: TUBAL LIGATION, 1st intercourse- 20, partners- 3  ?Other Topics Concern  ? Not on file  ?Social History Narrative  ? Pt lives with her spouse, daughter and college friend.   ?   ? Epworth Sleepiness Scale = 10 (as of 07/21/2015)  ? ?Social Determinants of Health  ? ?Emergency planning/management officer  Strain: Not on file  ?Food Insecurity: Not on file  ?Transportation Needs: Not on file  ?Physical Activity: Not on file  ?Stress: Not on file  ?Social Connections: Not on file  ?  ? ?Family History: ?The patient's family history includes Arthritis in her sister and another family member; Cancer in her mother; Diabetes in her father and mother; Diabetes type II in her mother; Heart attack in her father; Heart failure in her father; Hyperlipidemia in her mother and another family member; Hypertension in an other family member. There is no history of Colon cancer, Colon polyps, Esophageal cancer, Rectal cancer, or Stomach cancer. ? ?ROS:   ?Review of Systems  ?Constitution: Negative for decreased appetite, fever and weight gain.  ?HENT: Negative for congestion, ear discharge, hoarse voice and sore throat.   ?Eyes: Negative for discharge, redness, vision loss in right eye and visual halos.  ?Cardiovascular: Negative for chest pain, dyspnea on exertion, leg swelling, orthopnea and palpitations.  ?Respiratory: Negative for cough, hemoptysis, shortness of breath and snoring.   ?Endocrine: Negative for heat intolerance and polyphagia.  ?Hematologic/Lymphatic: Negative for bleeding problem. Does not bruise/bleed easily.  ?Skin: Negative for flushing, nail changes, rash and suspicious lesions.  ?Musculoskeletal: Negative for arthritis, joint pain, muscle cramps, myalgias, neck pain and stiffness.   ?Gastrointestinal: Negative for abdominal pain, bowel incontinence, diarrhea and excessive appetite.  ?Genitourinary: Negative for decreased libido, genital sores and incomplete emptying.  ?Neurological:

## 2021-08-02 NOTE — Patient Instructions (Addendum)
Medication Instructions:  ?Your physician recommends that you continue on your current medications as directed. Please refer to the Current Medication list given to you today.  ?*If you need a refill on your cardiac medications before your next appointment, please call your pharmacy* ? ? ?Lab Work: ?None ?If you have labs (blood work) drawn today and your tests are completely normal, you will receive your results only by: ?MyChart Message (if you have MyChart) OR ?A paper copy in the mail ?If you have any lab test that is abnormal or we need to change your treatment, we will call you to review the results. ? ? ?Testing/Procedures: ?None ? ? ?Follow-Up: ?At [facility], you and your health needs are our priority.  As part of our continuing mission to provide you with exceptional heart care, we have created designated Provider Care Teams.  These Care Teams include your primary Cardiologist (physician) and Advanced Practice Providers (APPs -  Physician Assistants and Nurse Practitioners) who all work together to provide you with the care you need, when you need it. ? ?We recommend signing up for the patient portal called "MyChart".  Sign up information is provided on this After Visit Summary.  MyChart is used to connect with patients for Virtual Visits (Telemedicine).  Patients are able to view lab/test results, encounter notes, upcoming appointments, etc.  Non-urgent messages can be sent to your provider as well.   ?To learn more about what you can do with MyChart, go to [email or web address].   ? ?Your next appointment:   ?6 month(s) ? ?The format for your next appointment:   ?In Person ? ?Provider:   ?Berniece Salines, DO   ? ? ?Other Instructions ?  ?

## 2021-08-03 ENCOUNTER — Other Ambulatory Visit: Payer: Self-pay | Admitting: Family Medicine

## 2021-08-03 ENCOUNTER — Other Ambulatory Visit: Payer: Self-pay

## 2021-08-03 MED ORDER — DEXCOM G6 SENSOR MISC: 1 refills | Status: DC

## 2021-08-03 MED ORDER — DEXCOM G6 RECEIVER DEVI: 0 refills | Status: DC

## 2021-08-03 MED ORDER — DEXCOM G6 TRANSMITTER MISC: 1.0000 | 0 refills | Status: DC

## 2021-08-03 NOTE — Telephone Encounter (Signed)
Done KH 

## 2021-08-04 ENCOUNTER — Encounter (HOSPITAL_COMMUNITY): Payer: Self-pay | Admitting: *Deleted

## 2021-08-04 NOTE — Progress Notes (Signed)
Received referral from Dr. [name] for this pt to participate in Cardiac rehab with the diagnosis of Chronic Systolic Heart Failure. Clinical review of pt follow up appt on  with 3/14 with Dr. [name] - cardiologist office note. Pt appropriate for scheduling for on site cardiac rehab and/or enrollment in Virtual Cardiac Rehab.  Pt Covid Risk Score is 3  Will forward to staff for follow up. Cherre Huger, BSN ?Cardiac and Pulmonary Rehab Nurse Navigator  ? ?

## 2021-08-08 ENCOUNTER — Encounter: Payer: Self-pay | Admitting: Family Medicine

## 2021-08-08 ENCOUNTER — Other Ambulatory Visit: Payer: Self-pay | Admitting: *Deleted

## 2021-08-08 MED ORDER — FREESTYLE LIBRE 3 SENSOR MISC: 1.0000 | 0 refills | Status: DC

## 2021-08-09 ENCOUNTER — Other Ambulatory Visit: Payer: Self-pay | Admitting: Family Medicine

## 2021-08-09 NOTE — Telephone Encounter (Signed)
[facility] is requesting to fill pt sumatriptan. Please advise [place] ?

## 2021-09-06 NOTE — Progress Notes (Signed)
Chief Complaint  ?Patient presents with  ? Diabetes  ?  Nonfasting med check, no new concerns. She is going to schedule eye exam with Dr. Marina Gravel called her and she knows she is due. Dr. [name] (cardio) asked if you would change Imitrex to something else.   ? ? ?Patient presents for 3 month follow-up. ? ?Diabetes:  Patient reports compliance with metformin and jardiance, and denies side effects. Occasional vaginal itching, not persistent, no discharge, no urinary complaints. Her dose of Jardiance was increased from 10 to 24m at her visit in 05/2021, due to A1c being 7.4%. ?She is using Freestyle Libre 3 monitor to help with controlling/monitoring her sugars. ?Sugars have been running: ?110-120 overnight, fasting morning sugar is around 110. ?<175 during the day. ?Denies hypoglycemia. No polydipsia, polyuria. ? ?She hasn't been getting regular exercise, but was recently referred for cardiac rehab by cardiologist. She is winded going up the stairs, and can't walk through stores to shop. ?She used to swim, but is no longer able to (too winded). ?At last visit we briefly discussed Mounjaro, but decided to increase Jardiance instead. ?She declined referral to DM education, as her daughter and husband (separated earlier this week) both have DM. Has gotten education through taking care of them.  ?  ?Nonischemic cardiomyopathy, under the care of Dr. THarriet Masson last seen a month ago.  She was complaining of palpitations.  Felt to have a component of deconditioning, and she was referred for cardiac rehab.  No med changes made--continues on Entresto, Aldactone, Jardiance and Coreg. ?DOE is unchanged.   Denies headaches, dizziness, edema. Denies chest pain. ? ?She was referred for sleep study, done last month.  Results were not discussed at last cardiology visit.  She states she never sleeps on her back (was told to for study). ?IMPRESSIONS ?- Moderate obstructive sleep apnea overall (AHI 18.2/h); however, sleep apnea was severe  with supine sleep (AHI 30.4/h). ?- Moderate oxygen desaturation to a nadir of 84%. ?- Patient snored for 108.1 minutes (25.5%) during the sleep. ?  ?DIAGNOSIS ?- Obstructive Sleep Apnea (G47.33) ?- Nocturnal hypoxemia ?  ?RECOMMENDATIONS ?- In this patient with cardiovascular co-morbidities recommend therapeutic CPAP for treatment of her sleep disordered breathing. If unable to obtain an in-lab titration, initiate Auto-PAP with EPR of 3 at 7 - 18 cm of water.  ?- Effort should be made to optimize nasal and orophayngeal patency. ?- Positional therapy avoiding supine position during sleep. ?- Avoid alcohol, sedatives and other CNS depressants that may worsen sleep apnea and disrupt normal sleep architecture. ?- Sleep hygiene should be reviewed to assess factors that may improve sleep quality. ?- Weight management (BMI 39) and regular exercise should be initiated or continued. ?- Recommend a download and sleep clinic evaluation after one month of therapy. ?  ?  ?Mixed hyperlipidemia:  Patient reports compliance with atorvastatin 156m and denies side effects. She previously had taken Vascepa (05/2018). It was noted that her lipids didn't improve, and she ended up stopping Vascepa. It also was noted that she had elevated LFT's on higher statin dose in the past.  ?She remains on 1080mf atorvastatin and omega 3 fish oil 3000 mg daily.  TG was elevated at 206 on this regimen in 05/2021, but sugars were also above goal (A1c >7).  No changes in meds were made. ? ?She is trying to follow a lowfat diet.  Eating grilled chicken when eating out, trying to eat at home more. Still avoids dessert, no alcohol, no  juices. ?Lab Results  ?Component Value Date  ? CHOL 170 06/06/2021  ? HDL 47 06/06/2021  ? [place] 88 06/06/2021  ? LDLDIRECT 130.8 01/15/2014  ? TRIG 206 (H) 06/06/2021  ? CHOLHDL 3.6 06/06/2021  ? ?Migraines:  Last had migraine 06/2021, saw Dr. Redmond School (video visit). She had taken Imitrex. ?Cardiologist is recommending  change to different treatment. ?Migraines are hormonal.  Migraines are usually once a month--starts with Excedrin Migraine first, uses Imitrex only if migraine lasts more than a day. No aura. ?She has been bleeding x3 weeks (has Mirena IUD).  She had breast tenderness x 1 month, f/b spotting.  She sees Dr. Dellis Filbert for GYN. ? ?Vitamin D deficiency:  she had increased her dose from 6000 to 10,000 at last visit, so level was rechecked to ensure that it wasn't unsafe at that dose.  Level was 58.8.  She cut back to 5000 IU at that time, and continues on this dose. ? ? ? ?PMH, PSH, SH reviewed ? ?Outpatient Encounter Medications as of 09/07/2021  ?Medication Sig Note  ? ARIPiprazole (ABILIFY) 15 MG tablet Take 15 mg by mouth daily.   ? aspirin-acetaminophen-caffeine (EXCEDRIN MIGRAINE) 250-250-65 MG tablet Take 3 tablets by mouth daily as needed for headache. 09/07/2021: Took this am  ? atorvastatin (LIPITOR) 10 MG tablet TAKE 1 TABLET BY MOUTH EVERY DAY   ? b complex vitamins tablet Take 1 tablet by mouth daily.   ? carvedilol (COREG) 25 MG tablet TAKE 1 TABLET (25 MG TOTAL) BY MOUTH 2 (TWO) TIMES DAILY WITH A MEAL. KEEP FOLLOW UP APPOINTMENT FOR FUTURE REFILLS.   ? cetirizine (ZYRTEC) 10 MG tablet Take 10 mg by mouth daily.     ? Cholecalciferol (DIALYVITE VITAMIN D 5000) 125 MCG (5000 UT) capsule Take 5,000 Units by mouth daily.   ? Continuous Blood Gluc Sensor (FREESTYLE LIBRE 3 SENSOR) MISC 1 each by Does not apply route every 14 (fourteen) days.   ? esomeprazole (NEXIUM) 20 MG capsule Take 20 mg by mouth daily.   ? Eszopiclone 3 MG TABS Take 3 mg by mouth at bedtime.   ? FLUoxetine (PROZAC) 40 MG capsule Take 80 mg by mouth daily.   ? furosemide (LASIX) 40 MG tablet Take 1 tablet (40 mg total) by mouth daily.   ? KLOR-CON M20 20 MEQ tablet TAKE 1 TABLET BY MOUTH EVERY DAY   ? lamoTRIgine (LAMICTAL) 200 MG tablet Take 400 mg by mouth daily.   ? Misc Natural Products (GLUCOSAMINE CHONDROITIN TRIPLE) TABS Take 1 tablet by  mouth daily.   ? Omega-3 Fatty Acids (FISH OIL) 1000 MG CAPS Take 3,000 mg by mouth daily.   ? Probiotic Product (PROBIOTIC PO) Take 2 capsules by mouth daily.   ? Rimegepant Sulfate (NURTEC) 75 MG TBDP Take 1 tablet at onset of migraine (max 1 tablet/d)   ? sacubitril-valsartan (ENTRESTO) 24-26 MG Take 1 tablet by mouth 2 (two) times daily.   ? spironolactone (ALDACTONE) 25 MG tablet Take 0.5 tablets (12.5 mg total) by mouth daily.   ? Ubrogepant (UBRELVY) 100 MG TABS Take 1 tablet at onset of migraine.  May repeat in 2 hours if needed (max dose 210m/24 hr)   ? Ubrogepant (UBRELVY) 50 MG TABS Take 1 tablet at onset of migraine.  May repeat in 2 hours if needed (max dose 2052m24 hr)   ? [DISCONTINUED] empagliflozin (JARDIANCE) 25 MG TABS tablet Take 1 tablet (25 mg total) by mouth daily.   ? [DISCONTINUED] metFORMIN (GLUCOPHAGE) 1000  MG tablet TAKE 1 TABLET (1,000 MG TOTAL) BY MOUTH 2 (TWO) TIMES DAILY WITH A MEAL.   ? diclofenac Sodium (VOLTAREN) 1 % GEL Apply 1 application. topically daily as needed (pain). (Patient not taking: Reported on 09/07/2021) 06/08/2021: As needed  ? empagliflozin (JARDIANCE) 25 MG TABS tablet Take 1 tablet (25 mg total) by mouth daily.   ? Glucose Blood (BLOOD GLUCOSE TEST STRIPS) STRP Test 1-2 times a day. Pt uses onetouch verio flex meter. Dx e11.9 (Patient not taking: Reported on 06/08/2021)   ? glucose blood test strip Test once a day. Pt uses one touch verio meter (Patient not taking: Reported on 06/08/2021)   ? Lancets (ONETOUCH DELICA PLUS VQQUIV14Y) MISC Test 1-2 times a day. Pt uses one touch verio flex meter dx e11.9 (Patient not taking: Reported on 06/08/2021)   ? metFORMIN (GLUCOPHAGE) 1000 MG tablet Take 1 tablet (1,000 mg total) by mouth 2 (two) times daily with a meal.   ? terconazole (TERAZOL 3) 0.8 % vaginal cream USE 1 APPLICATOR VAGINALLY FOR 3 DAYS (Patient not taking: Reported on 09/07/2021) 09/07/2021: As needed  ? [DISCONTINUED] SUMAtriptan (IMITREX) 100 MG tablet TAKE  1 TABLET AS NEEDED FOR MIGRAINE (NO MORE THAN 2 TABLETS IN A 24 HOUR PERIOD) MAY REPEAT IN 2 HOURS IF HEADACHE PERSISTS OR RECURS (Patient not taking: Reported on 09/07/2021) 09/07/2021: per cardiologist's r

## 2021-09-07 ENCOUNTER — Encounter: Payer: Self-pay | Admitting: Family Medicine

## 2021-09-07 ENCOUNTER — Ambulatory Visit (INDEPENDENT_AMBULATORY_CARE_PROVIDER_SITE_OTHER): Payer: Managed Care, Other (non HMO) | Admitting: Family Medicine

## 2021-09-07 VITALS — BP 116/74 | HR 80 | Ht 65.5 in | Wt 243.8 lb

## 2021-09-07 DIAGNOSIS — G4733 Obstructive sleep apnea (adult) (pediatric): Secondary | ICD-10-CM

## 2021-09-07 DIAGNOSIS — E782 Mixed hyperlipidemia: Secondary | ICD-10-CM | POA: Diagnosis not present

## 2021-09-07 DIAGNOSIS — E785 Hyperlipidemia, unspecified: Secondary | ICD-10-CM

## 2021-09-07 DIAGNOSIS — E118 Type 2 diabetes mellitus with unspecified complications: Secondary | ICD-10-CM | POA: Diagnosis not present

## 2021-09-07 DIAGNOSIS — E1169 Type 2 diabetes mellitus with other specified complication: Secondary | ICD-10-CM

## 2021-09-07 DIAGNOSIS — I428 Other cardiomyopathies: Secondary | ICD-10-CM

## 2021-09-07 DIAGNOSIS — I5042 Chronic combined systolic (congestive) and diastolic (congestive) heart failure: Secondary | ICD-10-CM

## 2021-09-07 DIAGNOSIS — G43829 Menstrual migraine, not intractable, without status migrainosus: Secondary | ICD-10-CM

## 2021-09-07 LAB — POCT GLYCOSYLATED HEMOGLOBIN (HGB A1C): Hemoglobin A1C: 6.4 % — AB (ref 4.0–5.6)

## 2021-09-07 MED ORDER — NURTEC 75 MG PO TBDP: ORAL_TABLET | ORAL | 0 refills | Status: DC

## 2021-09-07 MED ORDER — EMPAGLIFLOZIN 25 MG PO TABS: 25.0000 mg | ORAL_TABLET | Freq: Every day | ORAL | 1 refills | Status: DC

## 2021-09-07 MED ORDER — UBRELVY 100 MG PO TABS: ORAL_TABLET | ORAL | 0 refills | Status: DC

## 2021-09-07 MED ORDER — METFORMIN HCL 1000 MG PO TABS: 1000.0000 mg | ORAL_TABLET | Freq: Two times a day (BID) | ORAL | 1 refills | Status: DC

## 2021-09-07 MED ORDER — UBRELVY 50 MG PO TABS: ORAL_TABLET | ORAL | 0 refills | Status: DC

## 2021-09-07 NOTE — Patient Instructions (Addendum)
Sugars are much improved, continue current medications. ?I will contact Dr. [name] regarding the sleep study results.  I think you should try the CPAP and see how much this helps with your fatigue. ? ?You were given samples of Ubrelvy and Nurtec. Try ONE of these with your next migraine.  You can start with the 64m Ubrelvy.  If you don't have complete resolution of the headache, then take the 1079min 2 hours.   ?If the Nurtec is more effective, we can send in the prescription for that (vs whichever Ubrelvy dose was effective). ?Let usKoreanow which works the best and we can try and send a prescription.  Let usKoreanow if additional samples are needed while awaiting any prescription/insurance approval. ? ?Return in 6 months for your physical.  Return sooner if sugars are high, gaining weight, or want to further discuss weight loss treatments (mounjaro or ozempic diabetes medication). ? ? ?I recommend getting the new shingles vaccine (Shingrix). You may want to check with your insurance to verify what your out of pocket cost may be (usually covered as preventative, but better to verify to avoid any surprises, as this vaccine is expensive), and then schedule a nurse visit at our office when convenient (based on the possible side effects as discussed).   ?This is a series of 2 injections, spaced 2 months apart.  It doesn't have to be exactly 2 months apart (but can't be sooner), if that isn't feasible for your schedule, but try and get them close to 2 months (and definitely within 6 months of each other, or else the efficacy of the vaccine drops off). ?This should be separated from other vaccines by at least 2 weeks. ? ?

## 2021-09-16 ENCOUNTER — Telehealth (HOSPITAL_COMMUNITY): Payer: Self-pay

## 2021-09-16 NOTE — Telephone Encounter (Signed)
Pt insurance is active and benefits verified through [place]. Co-pay $0.00, DED $1,250.00/$478.45 met, out of pocket $4,500.00/$1,109.36 met, co-insurance 20%. No pre-authorization required. Tammy/[employer], 09/16/21 @ 2:34PM, LWU#[id number] ?

## 2021-09-21 ENCOUNTER — Telehealth (HOSPITAL_COMMUNITY): Payer: Self-pay

## 2021-09-21 NOTE — Telephone Encounter (Signed)
Called patient to see if she was interested in participating in the Cardiac Rehab Program. Patient stated yes. Patient will come in for orientation on 10/06/21 @ 11AM and will attend the 10:30AM exercise class. Went over insurance, patient verbalized understanding.  ?  ?Tourist information centre manager.  ?

## 2021-10-04 ENCOUNTER — Telehealth (HOSPITAL_COMMUNITY): Payer: Self-pay

## 2021-10-04 NOTE — Telephone Encounter (Signed)
Successful telephone encounter to patient to confirm cardiac rehab orientation appointment for 10/06/21 at 11:00 am. Health history completed. All questions answered. Confirmed patient has received orientation information via postal mail with directions and cardiac rehab contact.  ?

## 2021-10-06 ENCOUNTER — Encounter (HOSPITAL_COMMUNITY): Payer: Self-pay

## 2021-10-06 ENCOUNTER — Encounter (HOSPITAL_COMMUNITY)
Admission: RE | Admit: 2021-10-06 | Discharge: 2021-10-06 | Disposition: A | Discharge status: Home / Self Care | Payer: Managed Care, Other (non HMO) | Source: Ambulatory Visit | Attending: Cardiology | Admitting: Cardiology

## 2021-10-06 VITALS — BP 106/70 | HR 87 | Ht 66.25 in | Wt 246.9 lb

## 2021-10-06 DIAGNOSIS — I5022 Chronic systolic (congestive) heart failure: Secondary | ICD-10-CM | POA: Insufficient documentation

## 2021-10-06 DIAGNOSIS — E119 Type 2 diabetes mellitus without complications: Secondary | ICD-10-CM | POA: Insufficient documentation

## 2021-10-06 NOTE — Progress Notes (Signed)
Cardiac Rehab Medication Review by a Nurse  Does the patient  feel that his/her medications are working for him/her?  yes  Has the patient been experiencing any side effects to the medications prescribed?  no  Does the patient measure his/her own blood pressure or blood glucose at home?  yes   Does the patient have any problems obtaining medications due to transportation or finances?   no  Understanding of regimen: good Understanding of indications: good Potential of compliance: good    Nurse comments: Carolyn Salazar is taking her medications as prescribed and has a good understanding of what her medications are for. Carolyn Salazar has a CGM. Carolyn Salazar does not check her blood pressures on a regular basis.Carolyn Salazar does have a BP cuff/ monitor at home.Harrell Gave RN BSN     Christa See [name] 10/06/2021 3:40 PM

## 2021-10-06 NOTE — Progress Notes (Signed)
Cardiac Individual Treatment Plan  Patient Details  Name: Carolyn Salazar MRN: [id number] Date of Birth: [birth year] Referring Provider:   Flowsheet Row CARDIAC REHAB PHASE II ORIENTATION from 10/06/2021 in [facility] [place]  Referring Provider Berniece Salines, MD       Initial Encounter Date:  Flowsheet Row CARDIAC REHAB PHASE II ORIENTATION from 10/06/2021 in [place]  Date 10/06/21       Visit Diagnosis: Heart failure, chronic systolic (HCC)  Patient's Home Medications on Admission:  Current Outpatient Medications:    ARIPiprazole (ABILIFY) 15 MG tablet, Take 15 mg by mouth daily., Disp: , Rfl:    aspirin-acetaminophen-caffeine (EXCEDRIN MIGRAINE) 250-250-65 MG tablet, Take 3 tablets by mouth daily as needed for headache., Disp: , Rfl:    atorvastatin (LIPITOR) 10 MG tablet, TAKE 1 TABLET BY MOUTH EVERY DAY, Disp: 90 tablet, Rfl: 2   b complex vitamins tablet, Take 1 tablet by mouth daily., Disp: , Rfl:    carvedilol (COREG) 25 MG tablet, TAKE 1 TABLET (25 MG TOTAL) BY MOUTH 2 (TWO) TIMES DAILY WITH A MEAL. KEEP FOLLOW UP APPOINTMENT FOR FUTURE REFILLS., Disp: 180 tablet, Rfl: 3   cetirizine (ZYRTEC) 10 MG tablet, Take 10 mg by mouth daily.  , Disp: , Rfl:    Cholecalciferol (DIALYVITE VITAMIN D 5000) 125 MCG (5000 UT) capsule, Take 5,000 Units by mouth daily., Disp: , Rfl:    Collagen-Boron-Hyaluronic Acid (MOVE FREE ULTRA JOINT HEALTH) 40-5-3.3 MG TABS, Take 1 tablet by mouth daily., Disp: , Rfl:    diclofenac Sodium (VOLTAREN) 1 % GEL, Apply 1 application. topically daily as needed (pain)., Disp: , Rfl:    empagliflozin (JARDIANCE) 25 MG TABS tablet, Take 1 tablet (25 mg total) by mouth daily., Disp: 90 tablet, Rfl: 1   esomeprazole (NEXIUM) 20 MG capsule, Take 20 mg by mouth daily., Disp: , Rfl:    Eszopiclone 3 MG TABS, Take 3 mg by mouth at bedtime., Disp: , Rfl:    FLUoxetine (PROZAC) 40 MG capsule, Take 80 mg by mouth  daily., Disp: , Rfl:    furosemide (LASIX) 40 MG tablet, Take 1 tablet (40 mg total) by mouth daily., Disp: 90 tablet, Rfl: 3   KLOR-CON M20 20 MEQ tablet, TAKE 1 TABLET BY MOUTH EVERY DAY, Disp: 90 tablet, Rfl: 2   lamoTRIgine (LAMICTAL) 200 MG tablet, Take 400 mg by mouth daily., Disp: , Rfl:    metFORMIN (GLUCOPHAGE) 1000 MG tablet, Take 1 tablet (1,000 mg total) by mouth 2 (two) times daily with a meal., Disp: 180 tablet, Rfl: 1   olopatadine (PATANOL) 0.1 % ophthalmic solution, Place 1 drop into both eyes 2 (two) times daily as needed for allergies., Disp: , Rfl:    Omega-3 Fatty Acids (FISH OIL) 1000 MG CAPS, Take 3,000 mg by mouth daily., Disp: , Rfl:    Probiotic Product (PROBIOTIC PO), Take 2 capsules by mouth daily., Disp: , Rfl:    Rimegepant Sulfate (NURTEC) 75 MG TBDP, Take 1 tablet at onset of migraine (max 1 tablet/d) (Patient taking differently: Take 75 mg by mouth daily as needed (migraines). Take 1 tablet at onset of migraine (max 1 tablet/d)), Disp: 1 tablet, Rfl: 0   sacubitril-valsartan (ENTRESTO) 24-26 MG, Take 1 tablet by mouth 2 (two) times daily., Disp: 180 tablet, Rfl: 3   spironolactone (ALDACTONE) 25 MG tablet, Take 0.5 tablets (12.5 mg total) by mouth daily., Disp: 45 tablet, Rfl: 3   Ubrogepant (UBRELVY) 100 MG TABS,  Take 1 tablet at onset of migraine.  May repeat in 2 hours if needed (max dose 242m/24 hr), Disp: 1 tablet, Rfl: 0   Continuous Blood Gluc Sensor (FREESTYLE LIBRE 3 SENSOR) MISC, 1 each by Does not apply route every 14 (fourteen) days., Disp: 6 each, Rfl: 0   Glucose Blood (BLOOD GLUCOSE TEST STRIPS) STRP, Test 1-2 times a day. Pt uses onetouch verio flex meter. Dx e11.9 (Patient not taking: Reported on 06/08/2021), Disp: 200 strip, Rfl: 2   glucose blood test strip, Test once a day. Pt uses one touch verio meter (Patient not taking: Reported on 06/08/2021), Disp: 100 each, Rfl: 3   Lancets (ONETOUCH DELICA PLUS [id number] MISC, Test 1-2 times a day. Pt uses  one touch verio flex meter dx e11.9 (Patient not taking: Reported on 06/08/2021), Disp: 200 each, Rfl: 1  Current Facility-Administered Medications:    levonorgestrel (MIRENA) 20 MCG/24HR IUD, , Intrauterine, Once, FTerrance Mass MD  Past Medical History: Past Medical History:  Diagnosis Date   Allergy    Arthritis    Hands, Knees RT>LT   BENIGN NEOPLASM OF SKIN SITE UNSPECIFIED    benign mole   Blood transfusion without reported diagnosis    BUNIONS, BILATERAL    CARDIOMYOPATHY 02/1999   EF 20% in 02/1999, improved over time-  EF 40-45% at cath 2017   Chest pain    normal coronaries 2000 and 2017 (after an abnormal Myoview)   CHF (congestive heart failure) (HCC)    DEPRESSION    DIABETES MELLITUS, TYPE II, CONTROLLED, MILD    DYSLIPIDEMIA    GERD (gastroesophageal reflux disease)    Hypotension    November, 28811  METABOLIC SYNDROME X    hypertriglycerides 04/2008, hyperglycemia   MIGRAINE HEADACHE    NASH (nonalcoholic steatohepatitis)    OBESITY    Shingles (herpes zoster) polyneuropathy    Sinus tachycardia     Tobacco Use: Social History   Tobacco Use  Smoking Status Never  Smokeless Tobacco Never    Labs: Review Flowsheet        Latest Ref Rng & Units 12/01/2020 03/01/2021 05/18/2021 06/06/2021  Labs for ITP Cardiac and Pulmonary Rehab  Cholestrol 100 - 199 mg/dL  141    170    LDL (calc) 0 - 99 mg/dL  63    88    HDL-C >39 mg/dL  40    47    Trlycerides 0 - 149 mg/dL  235    206    Hemoglobin A1c 4.0 - 5.6 % 6.9   6.9    7.4    PH, Arterial 7.350 - 7.450   7.388     PCO2 arterial 32.0 - 48.0 mmHg   42.5     Bicarbonate 20.0 - 28.0 mmol/L   26.9     25.6     TCO2 22 - 32 mmol/L   28     27     O2 Saturation %   67.0     97.0        09/07/2021  Labs for ITP Cardiac and Pulmonary Rehab  Cholestrol   LDL (calc)   HDL-C   Trlycerides   Hemoglobin A1c 6.4    PH, Arterial   PCO2 arterial   Bicarbonate   TCO2   O2 Saturation       Multiple  values from one day are sorted in reverse-chronological order        Capillary Blood Glucose: Lab Results  Component Value Date   GLUCAP 163 (H) 05/18/2021   GLUCAP 208 (H) 05/18/2021   GLUCAP 141 (H) 08/25/2015   GLUCAP 110 (H) 07/10/2011   GLUCAP 95 07/10/2011     Exercise Target Goals: Exercise Program Goal: Individual exercise prescription set using results from initial 6 min walk test and THRR while considering  patient's activity barriers and safety.   Exercise Prescription Goal: Starting with aerobic activity 30 plus minutes a day, 3 days per week for initial exercise prescription. Provide home exercise prescription and guidelines that participant acknowledges understanding prior to discharge.  Activity Barriers & Risk Stratification:  Activity Barriers & Cardiac Risk Stratification - 10/06/21 1350       Activity Barriers & Cardiac Risk Stratification   Activity Barriers Decreased Ventricular Function;Deconditioning;Shortness of Breath    Cardiac Risk Stratification High             6 Minute Walk:  6 Minute Walk     Row Name 10/06/21 1125         6 Minute Walk   Phase Initial     Distance 800 feet     Walk Time 6 minutes     # of Rest Breaks 1  5:43-6:00 minutes     METS 2.4     RPE 12     Perceived Dyspnea  2     VO2 Peak 8.26     Symptoms Yes (comment)     Comments SOB, RPD = 2     Resting HR 97 bpm     Resting BP 106/70     Resting Oxygen Saturation  98 %     Exercise Oxygen Saturation  during 6 min walk 98 %     Max Ex. HR 111 bpm     Max Ex. BP 110/80     2 Minute Post BP 100/70              Oxygen Initial Assessment:   Oxygen Re-Evaluation:   Oxygen Discharge (Final Oxygen Re-Evaluation):   Initial Exercise Prescription:  Initial Exercise Prescription - 10/06/21 1300       Date of Initial Exercise RX and Referring Provider   Date 10/06/21    Referring Provider Berniece Salines, MD      NuStep   Level 1    SPM 75     Minutes 25    METs 2.4      Prescription Details   Frequency (times per week) 3    Duration Progress to 10 minutes continuous walking  at current work load and total walking time to 30-45 min      Intensity   Ratings of Perceived Exertion 11-13    Perceived Dyspnea 0-4      Progression   Progression Continue progressive overload as per policy without signs/symptoms or physical distress.      Resistance Training   Training Prescription Yes    Weight 2 lbs    Reps 10-15             Perform Capillary Blood Glucose checks as needed.  Exercise Prescription Changes:   Exercise Comments:   Exercise Goals and Review:   Exercise Goals     Row Name 10/06/21 1354             Exercise Goals   Increase Physical Activity Yes       Intervention Provide advice, education, support and counseling about physical activity/exercise needs.;Develop an individualized exercise prescription for aerobic and resistive training  based on initial evaluation findings, risk stratification, comorbidities and participant's personal goals.       Expected Outcomes Short Term: Attend rehab on a regular basis to increase amount of physical activity.;Long Term: Add in home exercise to make exercise part of routine and to increase amount of physical activity.;Long Term: Exercising regularly at least 3-5 days a week.       Increase Strength and Stamina Yes       Intervention Provide advice, education, support and counseling about physical activity/exercise needs.;Develop an individualized exercise prescription for aerobic and resistive training based on initial evaluation findings, risk stratification, comorbidities and participant's personal goals.       Expected Outcomes Short Term: Increase workloads from initial exercise prescription for resistance, speed, and METs.;Short Term: Perform resistance training exercises routinely during rehab and add in resistance training at home;Long Term: Improve  cardiorespiratory fitness, muscular endurance and strength as measured by increased METs and functional capacity (6MWT)       Able to understand and use rate of perceived exertion (RPE) scale Yes       Intervention Provide education and explanation on how to use RPE scale       Expected Outcomes Short Term: Able to use RPE daily in rehab to express subjective intensity level;Long Term:  Able to use RPE to guide intensity level when exercising independently       Able to understand and use Dyspnea scale Yes       Intervention Provide education and explanation on how to use Dyspnea scale       Expected Outcomes Short Term: Able to use Dyspnea scale daily in rehab to express subjective sense of shortness of breath during exertion;Long Term: Able to use Dyspnea scale to guide intensity level when exercising independently       Knowledge and understanding of Target Heart Rate Range (THRR) Yes       Intervention Provide education and explanation of THRR including how the numbers were predicted and where they are located for reference       Expected Outcomes Short Term: Able to state/look up THRR;Long Term: Able to use THRR to govern intensity when exercising independently;Short Term: Able to use daily as guideline for intensity in rehab       Understanding of Exercise Prescription Yes       Intervention Provide education, explanation, and written materials on patient's individual exercise prescription       Expected Outcomes Short Term: Able to explain program exercise prescription;Long Term: Able to explain home exercise prescription to exercise independently                Exercise Goals Re-Evaluation :    Discharge Exercise Prescription (Final Exercise Prescription Changes):   Nutrition:  Target Goals: Understanding of nutrition guidelines, daily intake of sodium <1534m, cholesterol <2063m calories 30% from fat and 7% or less from saturated fats, daily to have 5 or more servings of fruits  and vegetables.  Biometrics:  Pre Biometrics - 10/06/21 1100       Pre Biometrics   Waist Circumference 51 inches    Hip Circumference 56 inches    Waist to Hip Ratio 0.91 %    Triceps Skinfold 33 mm    % Body Fat 50.2 %    Grip Strength 24 kg    Flexibility 10.5 in    Single Leg Stand 30 seconds              Nutrition Therapy Plan and  Nutrition Goals:   Nutrition Assessments:  MEDIFICTS Score Key: ?70 Need to make dietary changes  40-70 Heart Healthy Diet ? 40 Therapeutic Level Cholesterol Diet   Picture Your Plate Scores: <17 Unhealthy dietary pattern with much room for improvement. 41-50 Dietary pattern unlikely to meet recommendations for good health and room for improvement. 51-60 More healthful dietary pattern, with some room for improvement.  >60 Healthy dietary pattern, although there may be some specific behaviors that could be improved.    Nutrition Goals Re-Evaluation:   Nutrition Goals Discharge (Final Nutrition Goals Re-Evaluation):   Psychosocial: Target Goals: Acknowledge presence or absence of significant depression and/or stress, maximize coping skills, provide positive support system. Participant is able to verbalize types and ability to use techniques and skills needed for reducing stress and depression.  Initial Review & Psychosocial Screening:  Initial Psych Review & Screening - 10/06/21 1536       Initial Review   Current issues with Current Stress Concerns;History of Depression    Source of Stress Concerns Family;Unable to perform yard/household activities;Unable to participate in former interests or hobbies;Chronic Illness    Comments Delsa Sale is going through a seperation. Delsa Sale is on an antidepresant and is not experiencing any depression at this time      [place]? Yes   Delsa Sale has her friend and daughter for support     Barriers   Psychosocial barriers to participate in program The patient should benefit  from training in stress management and relaxation.      Screening Interventions   Interventions Encouraged to exercise;Provide feedback about the scores to participant    Expected Outcomes Long Term Goal: Stressors or current issues are controlled or eliminated.;Short Term goal: Identification and review with participant of any Quality of Life or Depression concerns found by scoring the questionnaire.;Long Term goal: The participant improves quality of Life and PHQ9 Scores as seen by post scores and/or verbalization of changes             Quality of Life Scores:  Quality of Life - 10/06/21 1340       Quality of Life   Select Quality of Life      Quality of Life Scores   Health/Function Pre 19.93 %    Socioeconomic Pre 28.21 %    Psych/Spiritual Pre 27.5 %    Family Pre 30 %    GLOBAL Pre 24.68 %            Scores of 19 and below usually indicate a poorer quality of life in these areas.  A difference of  2-3 points is a clinically meaningful difference.  A difference of 2-3 points in the total score of the Quality of Life Index has been associated with significant improvement in overall quality of life, self-image, physical symptoms, and general health in studies assessing change in quality of life.  PHQ-9: Review Flowsheet        10/06/2021 11/29/2017 01/15/2014 08/12/2012  Depression screen PHQ 2/9  Decreased Interest 0 0 0 0  Down, Depressed, Hopeless 0 0 1 0  PHQ - 2 Score 0 0 1 0         Interpretation of Total Score  Total Score Depression Severity:  1-4 = Minimal depression, 5-9 = Mild depression, 10-14 = Moderate depression, 15-19 = Moderately severe depression, 20-27 = Severe depression   Psychosocial Evaluation and Intervention:   Psychosocial Re-Evaluation:   Psychosocial Discharge (Final Psychosocial Re-Evaluation):   Vocational  Rehabilitation: Provide vocational rehab assistance to qualifying candidates.   Vocational Rehab Evaluation &  Intervention:  Vocational Rehab - 10/06/21 1539       Initial Vocational Rehab Evaluation & Intervention   Assessment shows need for Vocational Rehabilitation No   Delsa Sale is disabled and does not need vocational rehab at this time            Education: Education Goals: Education classes will be provided on a weekly basis, covering required topics. Participant will state understanding/return demonstration of topics presented.  Learning Barriers/Preferences:  Learning Barriers/Preferences - 10/06/21 1341       Learning Barriers/Preferences   Learning Barriers Sight   wears glasses   Learning Preferences Computer/Internet;Written Material             Education Topics: Hypertension, Hypertension Reduction -Define heart disease and high blood pressure. Discus how high blood pressure affects the body and ways to reduce high blood pressure.   Exercise and Your Heart -Discuss why it is important to exercise, the FITT principles of exercise, normal and abnormal responses to exercise, and how to exercise safely.   Angina -Discuss definition of angina, causes of angina, treatment of angina, and how to decrease risk of having angina.   Cardiac Medications -Review what the following cardiac medications are used for, how they affect the body, and side effects that may occur when taking the medications.  Medications include Aspirin, Beta blockers, calcium channel blockers, ACE Inhibitors, angiotensin receptor blockers, diuretics, digoxin, and antihyperlipidemics.   Congestive Heart Failure -Discuss the definition of CHF, how to live with CHF, the signs and symptoms of CHF, and how keep track of weight and sodium intake.   Heart Disease and Intimacy -Discus the effect sexual activity has on the heart, how changes occur during intimacy as we age, and safety during sexual activity.   Smoking Cessation / COPD -Discuss different methods to quit smoking, the health benefits of  quitting smoking, and the definition of COPD.   Nutrition I: Fats -Discuss the types of cholesterol, what cholesterol does to the heart, and how cholesterol levels can be controlled.   Nutrition II: Labels -Discuss the different components of food labels and how to read food label   Heart Parts/Heart Disease and PAD -Discuss the anatomy of the heart, the pathway of blood circulation through the heart, and these are affected by heart disease.   Stress I: Signs and Symptoms -Discuss the causes of stress, how stress may lead to anxiety and depression, and ways to limit stress.   Stress II: Relaxation -Discuss different types of relaxation techniques to limit stress.   Warning Signs of Stroke / TIA -Discuss definition of a stroke, what the signs and symptoms are of a stroke, and how to identify when someone is having stroke.   Knowledge Questionnaire Score:  Knowledge Questionnaire Score - 10/06/21 1341       Knowledge Questionnaire Score   Pre Score 20/24             Core Components/Risk Factors/Patient Goals at Admission:  Personal Goals and Risk Factors at Admission - 10/06/21 1346       Core Components/Risk Factors/Patient Goals on Admission    Weight Management Yes;Obesity;Weight Loss    Intervention Weight Management: Develop a combined nutrition and exercise program designed to reach desired caloric intake, while maintaining appropriate intake of nutrient and fiber, sodium and fats, and appropriate energy expenditure required for the weight goal.;Weight Management: Provide education and appropriate resources to help  participant work on and attain dietary goals.;Weight Management/Obesity: Establish reasonable short term and long term weight goals.;Obesity: Provide education and appropriate resources to help participant work on and attain dietary goals.    Admit Weight 246 lb 14.6 oz (112 kg)    Expected Outcomes Short Term: Continue to assess and modify interventions  until short term weight is achieved;Long Term: Adherence to nutrition and physical activity/exercise program aimed toward attainment of established weight goal;Weight Maintenance: Understanding of the daily nutrition guidelines, which includes 25-35% calories from fat, 7% or less cal from saturated fats, less than 268m cholesterol, less than 1.5gm of sodium, & 5 or more servings of fruits and vegetables daily;Weight Loss: Understanding of general recommendations for a balanced deficit meal plan, which promotes 1-2 lb weight loss per week and includes a negative energy balance of [phone] kcal/d;Understanding recommendations for meals to include 15-35% energy as protein, 25-35% energy from fat, 35-60% energy from carbohydrates, less than 2032mof dietary cholesterol, 20-35 gm of total fiber daily;Understanding of distribution of calorie intake throughout the day with the consumption of 4-5 meals/snacks    Improve shortness of breath with ADL's Yes    Intervention Provide education, individualized exercise plan and daily activity instruction to help decrease symptoms of SOB with activities of daily living.    Expected Outcomes Short Term: Improve cardiorespiratory fitness to achieve a reduction of symptoms when performing ADLs;Long Term: Be able to perform more ADLs without symptoms or delay the onset of symptoms    Diabetes Yes    Intervention Provide education about signs/symptoms and action to take for hypo/hyperglycemia.;Provide education about proper nutrition, including hydration, and aerobic/resistive exercise prescription along with prescribed medications to achieve blood glucose in normal ranges: Fasting glucose 65-99 mg/dL    Expected Outcomes Short Term: Participant verbalizes understanding of the signs/symptoms and immediate care of hyper/hypoglycemia, proper foot care and importance of medication, aerobic/resistive exercise and nutrition plan for blood glucose control.;Long Term: Attainment of  HbA1C < 7%.    Heart Failure Yes    Intervention Provide a combined exercise and nutrition program that is supplemented with education, support and counseling about heart failure. Directed toward relieving symptoms such as shortness of breath, decreased exercise tolerance, and extremity edema.    Expected Outcomes Improve functional capacity of life;Short term: Attendance in program 2-3 days a week with increased exercise capacity. Reported lower sodium intake. Reported increased fruit and vegetable intake. Reports medication compliance.;Short term: Daily weights obtained and reported for increase. Utilizing diuretic protocols set by physician.;Long term: Adoption of self-care skills and reduction of barriers for early signs and symptoms recognition and intervention leading to self-care maintenance.    Hypertension Yes    Intervention Provide education on lifestyle modifcations including regular physical activity/exercise, weight management, moderate sodium restriction and increased consumption of fresh fruit, vegetables, and low fat dairy, alcohol moderation, and smoking cessation.;Monitor prescription use compliance.    Expected Outcomes Short Term: Continued assessment and intervention until BP is < 140/9056mG in hypertensive participants. < 130/42m78m in hypertensive participants with diabetes, heart failure or chronic kidney disease.;Long Term: Maintenance of blood pressure at goal levels.    Lipids Yes    Intervention Provide education and support for participant on nutrition & aerobic/resistive exercise along with prescribed medications to achieve LDL <70mg31mL >40mg.24mExpected Outcomes Short Term: Participant states understanding of desired cholesterol values and is compliant with medications prescribed. Participant is following exercise prescription and nutrition guidelines.;Long Term: Cholesterol controlled with medications  as prescribed, with individualized exercise RX and with personalized  nutrition plan. Value goals: LDL < 11m, HDL > 40 mg.    Stress Yes    Intervention Offer individual and/or small group education and counseling on adjustment to heart disease, stress management and health-related lifestyle change. Teach and support self-help strategies.;Refer participants experiencing significant psychosocial distress to appropriate mental health specialists for further evaluation and treatment. When possible, include family members and significant others in education/counseling sessions.    Expected Outcomes Short Term: Participant demonstrates changes in health-related behavior, relaxation and other stress management skills, ability to obtain effective social support, and compliance with psychotropic medications if prescribed.;Long Term: Emotional wellbeing is indicated by absence of clinically significant psychosocial distress or social isolation.             Core Components/Risk Factors/Patient Goals Review:    Core Components/Risk Factors/Patient Goals at Discharge (Final Review):    ITP Comments:  ITP Comments     Row Name 10/06/21 1535           ITP Comments Dr TFransico HimMD, Medical Director                Comments:Jen attended orientation on 10/06/2021 to review rules and guidelines for program.  Completed 5 minutes 43 seconds of the 6 minute walk test, due to deconditioning and shortness of breath. Intitial ITP, and exercise prescription.  VSS. Telemetry-Sinus Rhythm.. Safety measures and social distancing in place per CDC guidelines.MHarrell GaveRN BSN

## 2021-10-10 ENCOUNTER — Encounter (HOSPITAL_COMMUNITY)
Admission: RE | Admit: 2021-10-10 | Discharge: 2021-10-10 | Disposition: A | Discharge status: Home / Self Care | Payer: Managed Care, Other (non HMO) | Source: Ambulatory Visit | Attending: Cardiology | Admitting: Cardiology

## 2021-10-10 DIAGNOSIS — E119 Type 2 diabetes mellitus without complications: Secondary | ICD-10-CM | POA: Diagnosis not present

## 2021-10-10 DIAGNOSIS — I5022 Chronic systolic (congestive) heart failure: Secondary | ICD-10-CM

## 2021-10-10 LAB — GLUCOSE, CAPILLARY
Glucose-Capillary: 169 mg/dL — ABNORMAL HIGH (ref 70–99)
Glucose-Capillary: 126 mg/dL — ABNORMAL HIGH (ref 70–99)

## 2021-10-10 NOTE — Progress Notes (Signed)
Daily Session Note  Patient Details  Name: Carolyn Salazar MRN: [id number] Date of Birth: 1971-04-14 Referring Provider:   Flowsheet Row CARDIAC REHAB PHASE II ORIENTATION from 10/06/2021 in [place]  Referring Provider Berniece Salines, MD       Encounter Date: 10/10/2021  Check In:  Session Check In - 10/10/21 1031       Check-In   Supervising physician immediately available to respond to emergencies [facility] immediately available    Physician(s) Dr Tawanna Solo    Location [facility]    Staff Present Barnet Pall, RN, Milus Glazier, MS, ACSM-CEP, CCRP, Exercise Physiologist;Olinty Celesta Aver, MS, ACSM CEP, Exercise Physiologist;Carlette Wilber Oliphant, RN, BSN    Virtual Visit No    Medication changes reported     No    Fall or balance concerns reported    No    Tobacco Cessation No Change    Warm-up and Cool-down Performed as group-led instruction    Resistance Training Performed Yes    VAD Patient? No    PAD/SET Patient? No      Pain Assessment   Currently in Pain? No/denies    Pain Score 0-No pain    Multiple Pain Sites No             Capillary Blood Glucose: Results for orders placed or performed during the hospital encounter of 10/10/21 (from the past 24 hour(s))  Glucose, capillary     Status: Abnormal   Collection Time: 10/10/21 10:28 AM  Result Value Ref Range   Glucose-Capillary 169 (H) 70 - 99 mg/dL  Glucose, capillary     Status: Abnormal   Collection Time: 10/10/21 11:17 AM  Result Value Ref Range   Glucose-Capillary 126 (H) 70 - 99 mg/dL     Exercise Prescription Changes - 10/10/21 1028       Response to Exercise   Blood Pressure (Admit) 104/60    Blood Pressure (Exercise) 110/72    Blood Pressure (Exit) 104/78    Heart Rate (Admit) 91 bpm    Heart Rate (Exercise) 106 bpm    Heart Rate (Exit) 95 bpm    Rating of Perceived Exertion (Exercise) 12    Symptoms None    Comments Off to a good  start with exercise.    Duration Continue with 30 min of aerobic exercise without signs/symptoms of physical distress.    Intensity THRR unchanged      Progression   Progression Continue to progress workloads to maintain intensity without signs/symptoms of physical distress.    Average METs 1.6      Resistance Training   Training Prescription Yes    Weight 2 lbs    Reps 10-15    Time 10 Minutes      Interval Training   Interval Training No      NuStep   Level 1    SPM 40    Minutes 30    METs 1.6             Social History   Tobacco Use  Smoking Status Never  Smokeless Tobacco Never    Goals Met:  Exercise tolerated well No report of concerns or symptoms today Strength training completed today  Goals Unmet:  Not Applicable  Comments: Delsa Sale started cardiac rehab today.  Pt tolerated light exercise without difficulty. VSS, telemetry-Sinus Rhythm, asymptomatic.  Medication list reconciled. Pt denies barriers to medicaiton compliance.  PSYCHOSOCIAL ASSESSMENT:  PHQ-0. Pt exhibits positive coping skills, hopeful outlook  with supportive family. No psychosocial needs identified at this time, no psychosocial interventions necessary.    Pt enjoys knitting, audio books and reading..   Pt oriented to exercise equipment and routine.    Understanding verbalized.Harrell Gave RN BSN    Dr. Fransico Him is Medical Director for Cardiac Rehab at [facility].

## 2021-10-12 ENCOUNTER — Encounter (HOSPITAL_COMMUNITY)
Admission: RE | Admit: 2021-10-12 | Discharge: 2021-10-12 | Disposition: A | Discharge status: Home / Self Care | Payer: Managed Care, Other (non HMO) | Source: Ambulatory Visit | Attending: Cardiology | Admitting: Cardiology

## 2021-10-12 DIAGNOSIS — I5022 Chronic systolic (congestive) heart failure: Secondary | ICD-10-CM

## 2021-10-12 NOTE — Progress Notes (Signed)
QUALITY OF LIFE SCORE REVIEW  Pt completed Quality of Life survey as a participant in Cardiac Rehab.  Scores 21.0 or below are considered low.  Pt score very low in several areas Overall 24.68, Health and Function 19.93, socioeconomic 28.21, physiological and spiritual 27.50, family 30.0. Patient quality of life slightly altered by physical constraints which limits ability to perform as prior to recent cardiac illness. Carolyn Salazar says she is dissatisfied with her health due to her heart failure diagnosis. Carolyn Salazar says her depression is well controlled on  antidepressants.  Offered emotional support and reassurance. Carolyn Salazar has good family support. Will continue to monitor and intervene as necessary. Harrell Gave RN BSN

## 2021-10-14 ENCOUNTER — Encounter (HOSPITAL_COMMUNITY)
Admission: RE | Admit: 2021-10-14 | Discharge: 2021-10-14 | Disposition: A | Discharge status: Home / Self Care | Payer: Managed Care, Other (non HMO) | Source: Ambulatory Visit | Attending: Cardiology | Admitting: Cardiology

## 2021-10-14 DIAGNOSIS — I5022 Chronic systolic (congestive) heart failure: Secondary | ICD-10-CM

## 2021-10-18 ENCOUNTER — Other Ambulatory Visit: Payer: Self-pay | Admitting: Family Medicine

## 2021-10-19 ENCOUNTER — Encounter (HOSPITAL_COMMUNITY)
Admission: RE | Admit: 2021-10-19 | Discharge: 2021-10-19 | Disposition: A | Discharge status: Home / Self Care | Payer: Managed Care, Other (non HMO) | Source: Ambulatory Visit | Attending: Cardiology | Admitting: Cardiology

## 2021-10-19 DIAGNOSIS — I5022 Chronic systolic (congestive) heart failure: Secondary | ICD-10-CM

## 2021-10-21 ENCOUNTER — Encounter (HOSPITAL_COMMUNITY)
Admission: RE | Admit: 2021-10-21 | Discharge: 2021-10-21 | Disposition: A | Discharge status: Home / Self Care | Payer: Managed Care, Other (non HMO) | Source: Ambulatory Visit | Attending: Cardiology | Admitting: Cardiology

## 2021-10-21 DIAGNOSIS — I5022 Chronic systolic (congestive) heart failure: Secondary | ICD-10-CM | POA: Diagnosis present

## 2021-10-22 IMAGING — MG DIGITAL SCREENING BILAT W/ CAD
4 series · 4 of 4 positions shown · non-contrast
Comparison: Previous exam(s).

CLINICAL DATA: Screening.

EXAM:
DIGITAL SCREENING BILATERAL MAMMOGRAM WITH CAD
TECHNIQUE: Bilateral screening digital craniocaudal and mediolateral oblique
mammograms were obtained. The images were evaluated with
computer-aided detection.

[L MLO]
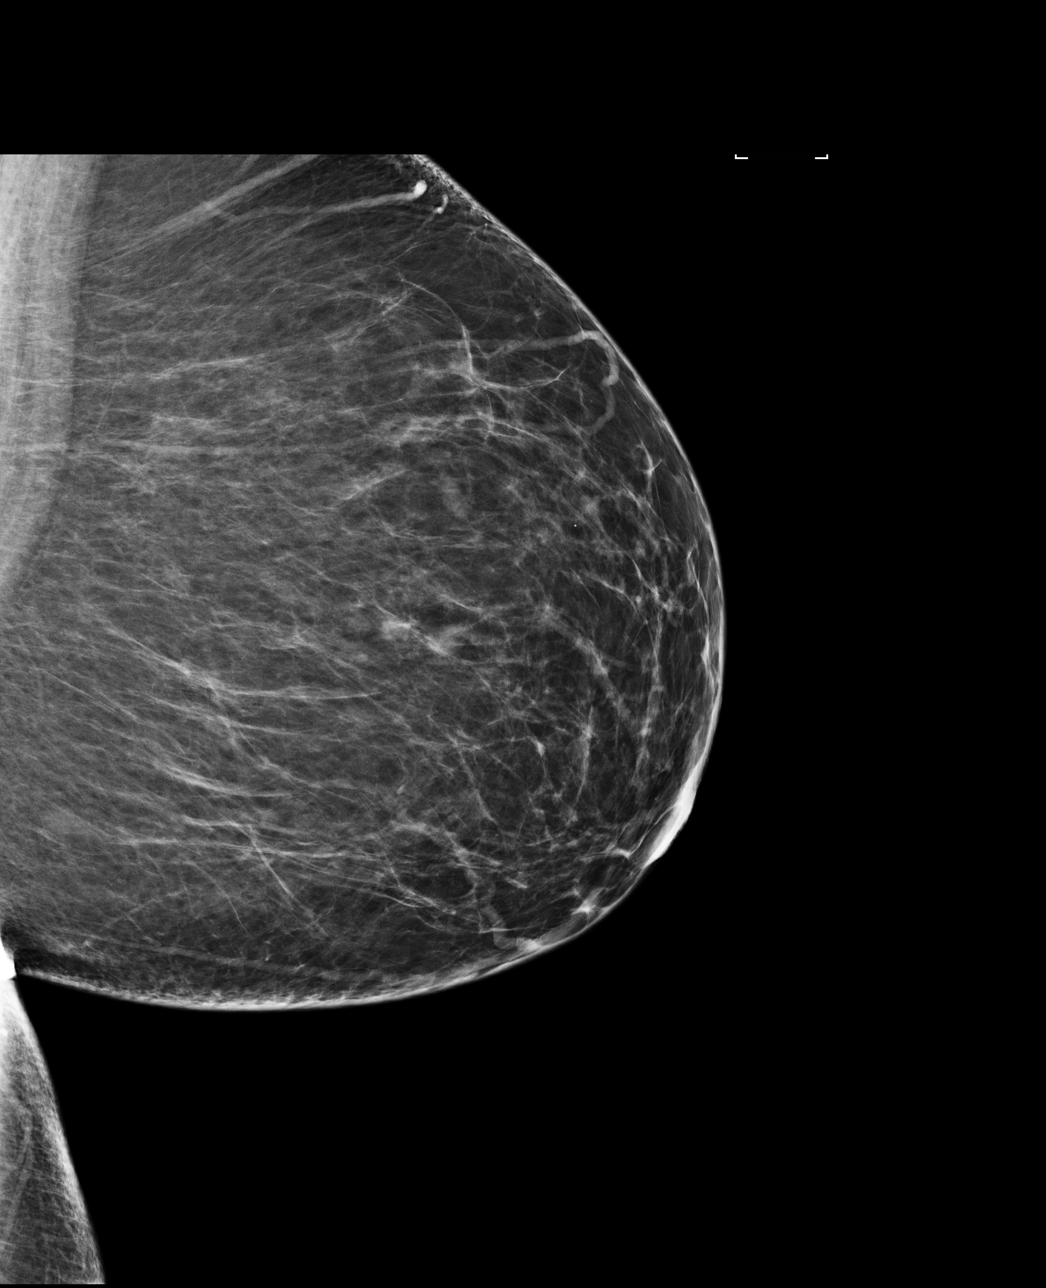

[R CC]
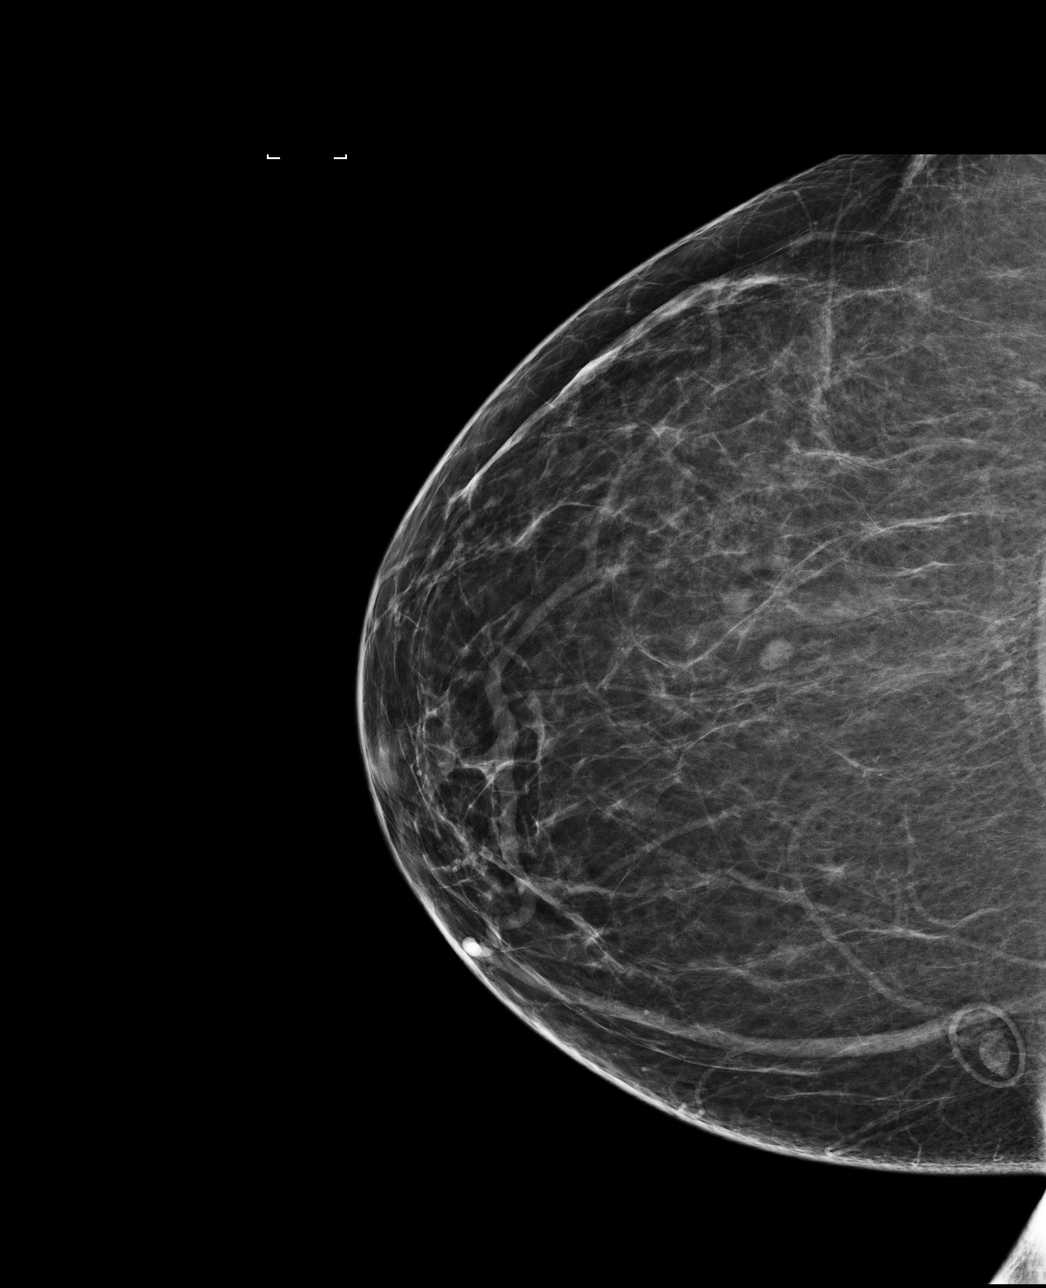

[R MLO]
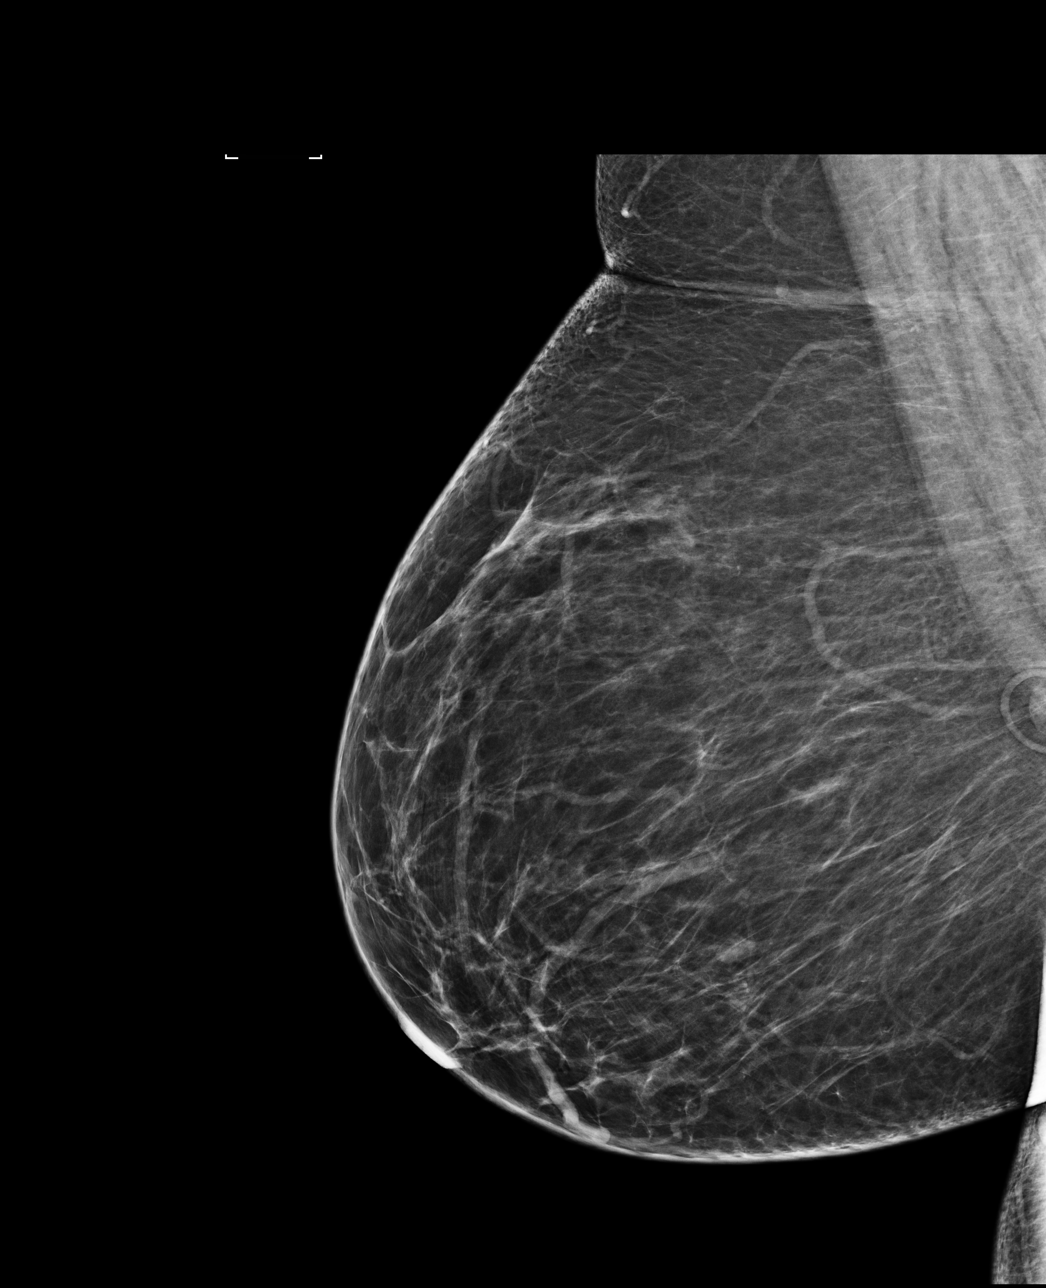

[L CC]
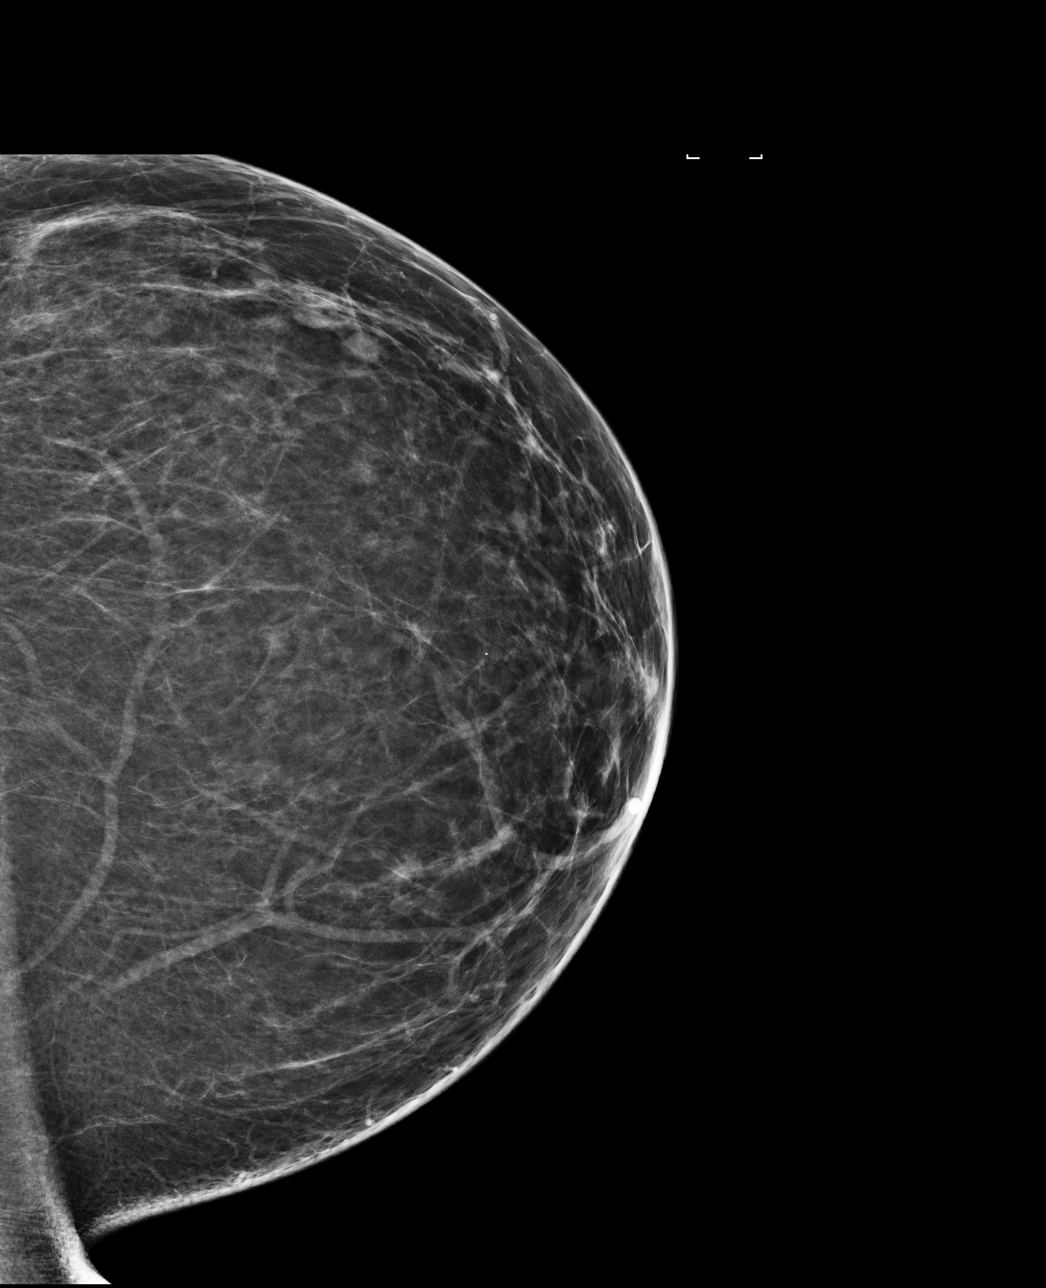

[4 of 4 positions shown; findings below may reference images not displayed]

ACR Breast Density Category b: There are scattered areas of
fibroglandular density.
FINDINGS: There are no findings suspicious for malignancy.
IMPRESSION: No mammographic evidence of malignancy. A result letter of this
screening mammogram will be mailed directly to the patient.

RECOMMENDATION:
Screening mammogram in one year. (Code:WO-V-ZRK)

BI-RADS CATEGORY  1: Negative.

## 2021-10-24 ENCOUNTER — Encounter (HOSPITAL_COMMUNITY)
Admission: RE | Admit: 2021-10-24 | Discharge: 2021-10-24 | Disposition: A | Discharge status: Home / Self Care | Payer: Managed Care, Other (non HMO) | Source: Ambulatory Visit | Attending: Cardiology | Admitting: Cardiology

## 2021-10-24 DIAGNOSIS — I5022 Chronic systolic (congestive) heart failure: Secondary | ICD-10-CM

## 2021-10-24 NOTE — Progress Notes (Signed)
Cardiac Individual Treatment Plan  Patient Details  Name: Carolyn Salazar MRN: [id number] Date of Birth: [birth year] Referring Provider:   Flowsheet Row CARDIAC REHAB PHASE II ORIENTATION from 10/06/2021 in [facility]  Referring Provider Berniece Salines, MD       Initial Encounter Date:  Flowsheet Row CARDIAC REHAB PHASE II ORIENTATION from 10/06/2021 in [facility]  Date 10/06/21       Visit Diagnosis: Heart failure, chronic systolic (HCC)  Patient's Home Medications on Admission:  Current Outpatient Medications:    ARIPiprazole (ABILIFY) 15 MG tablet, Take 15 mg by mouth daily., Disp: , Rfl:    aspirin-acetaminophen-caffeine (EXCEDRIN MIGRAINE) 250-250-65 MG tablet, Take 3 tablets by mouth daily as needed for headache., Disp: , Rfl:    atorvastatin (LIPITOR) 10 MG tablet, TAKE 1 TABLET BY MOUTH EVERY DAY, Disp: 90 tablet, Rfl: 2   b complex vitamins tablet, Take 1 tablet by mouth daily., Disp: , Rfl:    carvedilol (COREG) 25 MG tablet, TAKE 1 TABLET (25 MG TOTAL) BY MOUTH 2 (TWO) TIMES DAILY WITH A MEAL. KEEP FOLLOW UP APPOINTMENT FOR FUTURE REFILLS., Disp: 180 tablet, Rfl: 3   cetirizine (ZYRTEC) 10 MG tablet, Take 10 mg by mouth daily.  , Disp: , Rfl:    Cholecalciferol (DIALYVITE VITAMIN D 5000) 125 MCG (5000 UT) capsule, Take 5,000 Units by mouth daily., Disp: , Rfl:    Collagen-Boron-Hyaluronic Acid (MOVE FREE ULTRA JOINT HEALTH) 40-5-3.3 MG TABS, Take 1 tablet by mouth daily., Disp: , Rfl:    Continuous Blood Gluc Sensor (FREESTYLE LIBRE 3 SENSOR) MISC, 1 EACH BY DOES NOT APPLY ROUTE EVERY 14 (FOURTEEN) DAYS., Disp: 6 each, Rfl: 1   diclofenac Sodium (VOLTAREN) 1 % GEL, Apply 1 application. topically daily as needed (pain)., Disp: , Rfl:    empagliflozin (JARDIANCE) 25 MG TABS tablet, Take 1 tablet (25 mg total) by mouth daily., Disp: 90 tablet, Rfl: 1   esomeprazole (NEXIUM) 20 MG capsule, Take 20 mg by mouth daily.,  Disp: , Rfl:    Eszopiclone 3 MG TABS, Take 3 mg by mouth at bedtime., Disp: , Rfl:    FLUoxetine (PROZAC) 40 MG capsule, Take 80 mg by mouth daily., Disp: , Rfl:    furosemide (LASIX) 40 MG tablet, Take 1 tablet (40 mg total) by mouth daily., Disp: 90 tablet, Rfl: 3   Glucose Blood (BLOOD GLUCOSE TEST STRIPS) STRP, Test 1-2 times a day. Pt uses onetouch verio flex meter. Dx e11.9 (Patient not taking: Reported on 06/08/2021), Disp: 200 strip, Rfl: 2   glucose blood test strip, Test once a day. Pt uses one touch verio meter (Patient not taking: Reported on 06/08/2021), Disp: 100 each, Rfl: 3   KLOR-CON M20 20 MEQ tablet, TAKE 1 TABLET BY MOUTH EVERY DAY, Disp: 90 tablet, Rfl: 2   lamoTRIgine (LAMICTAL) 200 MG tablet, Take 400 mg by mouth daily., Disp: , Rfl:    Lancets (ONETOUCH DELICA PLUS TGPQDI26E) MISC, Test 1-2 times a day. Pt uses one touch verio flex meter dx e11.9 (Patient not taking: Reported on 06/08/2021), Disp: 200 each, Rfl: 1   metFORMIN (GLUCOPHAGE) 1000 MG tablet, Take 1 tablet (1,000 mg total) by mouth 2 (two) times daily with a meal., Disp: 180 tablet, Rfl: 1   olopatadine (PATANOL) 0.1 % ophthalmic solution, Place 1 drop into both eyes 2 (two) times daily as needed for allergies., Disp: , Rfl:    Omega-3 Fatty Acids (FISH OIL) 1000 MG  CAPS, Take 3,000 mg by mouth daily., Disp: , Rfl:    Probiotic Product (PROBIOTIC PO), Take 2 capsules by mouth daily., Disp: , Rfl:    Rimegepant Sulfate (NURTEC) 75 MG TBDP, Take 1 tablet at onset of migraine (max 1 tablet/d) (Patient taking differently: Take 75 mg by mouth daily as needed (migraines). Take 1 tablet at onset of migraine (max 1 tablet/d)), Disp: 1 tablet, Rfl: 0   sacubitril-valsartan (ENTRESTO) 24-26 MG, Take 1 tablet by mouth 2 (two) times daily., Disp: 180 tablet, Rfl: 3   spironolactone (ALDACTONE) 25 MG tablet, Take 0.5 tablets (12.5 mg total) by mouth daily., Disp: 45 tablet, Rfl: 3   Ubrogepant (UBRELVY) 100 MG TABS, Take 1 tablet  at onset of migraine.  May repeat in 2 hours if needed (max dose 229m/24 hr), Disp: 1 tablet, Rfl: 0  Current Facility-Administered Medications:    levonorgestrel (MIRENA) 20 MCG/24HR IUD, , Intrauterine, Once, FTerrance Mass MD  Past Medical History: Past Medical History:  Diagnosis Date   Allergy    Arthritis    Hands, Knees RT>LT   BENIGN NEOPLASM OF SKIN SITE UNSPECIFIED    benign mole   Blood transfusion without reported diagnosis    BUNIONS, BILATERAL    CARDIOMYOPATHY 02/1999   EF 20% in 02/1999, improved over time-  EF 40-45% at cath 2017   Chest pain    normal coronaries 2000 and 2017 (after an abnormal Myoview)   CHF (congestive heart failure) (HCC)    DEPRESSION    DIABETES MELLITUS, TYPE II, CONTROLLED, MILD    DYSLIPIDEMIA    GERD (gastroesophageal reflux disease)    Hypotension    November, 21245  METABOLIC SYNDROME X    hypertriglycerides 04/2008, hyperglycemia   MIGRAINE HEADACHE    NASH (nonalcoholic steatohepatitis)    OBESITY    Shingles (herpes zoster) polyneuropathy    Sinus tachycardia     Tobacco Use: Social History   Tobacco Use  Smoking Status Never  Smokeless Tobacco Never    Labs: Review Flowsheet        Latest Ref Rng & Units 12/01/2020 03/01/2021 05/18/2021 06/06/2021  Labs for ITP Cardiac and Pulmonary Rehab  Cholestrol 100 - 199 mg/dL  141    170    LDL (calc) 0 - 99 mg/dL  63    88    HDL-C >39 mg/dL  40    47    Trlycerides 0 - 149 mg/dL  235    206    Hemoglobin A1c 4.0 - 5.6 % 6.9   6.9    7.4    PH, Arterial 7.350 - 7.450   7.388     PCO2 arterial 32.0 - 48.0 mmHg   42.5     Bicarbonate 20.0 - 28.0 mmol/L   26.9     25.6     TCO2 22 - 32 mmol/L   28     27     O2 Saturation %   67.0     97.0        09/07/2021  Labs for ITP Cardiac and Pulmonary Rehab  Cholestrol   LDL (calc)   HDL-C   Trlycerides   Hemoglobin A1c 6.4    PH, Arterial   PCO2 arterial   Bicarbonate   TCO2   O2 Saturation       Multiple  values from one day are sorted in reverse-chronological order        Capillary Blood Glucose: Lab Results  Component Value Date   GLUCAP 126 (H) 10/10/2021   GLUCAP 169 (H) 10/10/2021   GLUCAP 163 (H) 05/18/2021   GLUCAP 208 (H) 05/18/2021   GLUCAP 141 (H) 08/25/2015     Exercise Target Goals: Exercise Program Goal: Individual exercise prescription set using results from initial 6 min walk test and THRR while considering  patient's activity barriers and safety.   Exercise Prescription Goal: Initial exercise prescription builds to 30-45 minutes a day of aerobic activity, 2-3 days per week.  Home exercise guidelines will be given to patient during program as part of exercise prescription that the participant will acknowledge.  Activity Barriers & Risk Stratification:  Activity Barriers & Cardiac Risk Stratification - 10/06/21 1350       Activity Barriers & Cardiac Risk Stratification   Activity Barriers Decreased Ventricular Function;Deconditioning;Shortness of Breath    Cardiac Risk Stratification High             6 Minute Walk:  6 Minute Walk     Row Name 10/06/21 1125         6 Minute Walk   Phase Initial     Distance 800 feet     Walk Time 6 minutes     # of Rest Breaks 1  5:43-6:00 minutes     METS 2.4     RPE 12     Perceived Dyspnea  2     VO2 Peak 8.26     Symptoms Yes (comment)     Comments SOB, RPD = 2     Resting HR 97 bpm     Resting BP 106/70     Resting Oxygen Saturation  98 %     Exercise Oxygen Saturation  during 6 min walk 98 %     Max Ex. HR 111 bpm     Max Ex. BP 110/80     2 Minute Post BP 100/70              Oxygen Initial Assessment:   Oxygen Re-Evaluation:   Oxygen Discharge (Final Oxygen Re-Evaluation):   Initial Exercise Prescription:  Initial Exercise Prescription - 10/06/21 1300       Date of Initial Exercise RX and Referring Provider   Date 10/06/21    Referring Provider Berniece Salines, MD      NuStep    Level 1    SPM 75    Minutes 25    METs 2.4      Prescription Details   Frequency (times per week) 3    Duration Progress to 10 minutes continuous walking  at current work load and total walking time to 30-45 min      Intensity   Ratings of Perceived Exertion 11-13    Perceived Dyspnea 0-4      Progression   Progression Continue progressive overload as per policy without signs/symptoms or physical distress.      Resistance Training   Training Prescription Yes    Weight 2 lbs    Reps 10-15             Perform Capillary Blood Glucose checks as needed.  Exercise Prescription Changes:   Exercise Prescription Changes     Row Name 10/10/21 1028 10/24/21 1016           Response to Exercise   Blood Pressure (Admit) 104/60 98/68      Blood Pressure (Exercise) 110/72 100/62      Blood Pressure (Exit) 104/78 105/72  Heart Rate (Admit) 91 bpm 97 bpm      Heart Rate (Exercise) 106 bpm 107 bpm      Heart Rate (Exit) 95 bpm 97 bpm      Rating of Perceived Exertion (Exercise) 12 13      Symptoms None None      Comments Off to a good start with exercise. Increased WL on Nustep.      Duration Continue with 30 min of aerobic exercise without signs/symptoms of physical distress. Continue with 30 min of aerobic exercise without signs/symptoms of physical distress.      Intensity THRR unchanged THRR unchanged        Progression   Progression Continue to progress workloads to maintain intensity without signs/symptoms of physical distress. Continue to progress workloads to maintain intensity without signs/symptoms of physical distress.      Average METs 1.6 1.8        Resistance Training   Training Prescription Yes Yes      Weight 2 lbs 2 lbs      Reps 10-15 10-15      Time 10 Minutes 10 Minutes        Interval Training   Interval Training No No        NuStep   Level 1 2      SPM 40 40      Minutes 30 30      METs 1.6 1.8               Exercise Comments:    Exercise Comments     Row Name 10/10/21 1139 10/24/21 1018         Exercise Comments Patient tolerated low intensity exercise well without symptoms. Reviewed METs and goals with patient. Increased WL on SE from 1.0 to 2.0.               Exercise Goals and Review:   Exercise Goals     Row Name 10/06/21 1354             Exercise Goals   Increase Physical Activity Yes       Intervention Provide advice, education, support and counseling about physical activity/exercise needs.;Develop an individualized exercise prescription for aerobic and resistive training based on initial evaluation findings, risk stratification, comorbidities and participant's personal goals.       Expected Outcomes Short Term: Attend rehab on a regular basis to increase amount of physical activity.;Long Term: Add in home exercise to make exercise part of routine and to increase amount of physical activity.;Long Term: Exercising regularly at least 3-5 days a week.       Increase Strength and Stamina Yes       Intervention Provide advice, education, support and counseling about physical activity/exercise needs.;Develop an individualized exercise prescription for aerobic and resistive training based on initial evaluation findings, risk stratification, comorbidities and participant's personal goals.       Expected Outcomes Short Term: Increase workloads from initial exercise prescription for resistance, speed, and METs.;Short Term: Perform resistance training exercises routinely during rehab and add in resistance training at home;Long Term: Improve cardiorespiratory fitness, muscular endurance and strength as measured by increased METs and functional capacity (6MWT)       Able to understand and use rate of perceived exertion (RPE) scale Yes       Intervention Provide education and explanation on how to use RPE scale       Expected Outcomes Short Term: Able to use RPE daily in  rehab to express subjective intensity  level;Long Term:  Able to use RPE to guide intensity level when exercising independently       Able to understand and use Dyspnea scale Yes       Intervention Provide education and explanation on how to use Dyspnea scale       Expected Outcomes Short Term: Able to use Dyspnea scale daily in rehab to express subjective sense of shortness of breath during exertion;Long Term: Able to use Dyspnea scale to guide intensity level when exercising independently       Knowledge and understanding of Target Heart Rate Range (THRR) Yes       Intervention Provide education and explanation of THRR including how the numbers were predicted and where they are located for reference       Expected Outcomes Short Term: Able to state/look up THRR;Long Term: Able to use THRR to govern intensity when exercising independently;Short Term: Able to use daily as guideline for intensity in rehab       Understanding of Exercise Prescription Yes       Intervention Provide education, explanation, and written materials on patient's individual exercise prescription       Expected Outcomes Short Term: Able to explain program exercise prescription;Long Term: Able to explain home exercise prescription to exercise independently                Exercise Goals Re-Evaluation :  Exercise Goals Re-Evaluation     [place] Name 10/10/21 1139 10/24/21 1018           Exercise Goal Re-Evaluation   Exercise Goals Review Increase Physical Activity;Able to understand and use rate of perceived exertion (RPE) scale Increase Physical Activity;Able to understand and use rate of perceived exertion (RPE) scale      Comments Patient able to understand and use RPE scale appropriately. Patient is making gradual progress with exercise. Patient's goal is to decrease SOB with ADLs. Increased WL on NuStep and tolerated well.      Expected Outcomes Progress workloads as tolerated to help improve endurance and increase confidence with exercise. Continue to  progress workloads as tolerated to help improve cardiorespiratory fitness and decrease SOB with ADLs.               Discharge Exercise Prescription (Final Exercise Prescription Changes):  Exercise Prescription Changes - 10/24/21 1016       Response to Exercise   Blood Pressure (Admit) 98/68    Blood Pressure (Exercise) 100/62    Blood Pressure (Exit) 105/72    Heart Rate (Admit) 97 bpm    Heart Rate (Exercise) 107 bpm    Heart Rate (Exit) 97 bpm    Rating of Perceived Exertion (Exercise) 13    Symptoms None    Comments Increased WL on Nustep.    Duration Continue with 30 min of aerobic exercise without signs/symptoms of physical distress.    Intensity THRR unchanged      Progression   Progression Continue to progress workloads to maintain intensity without signs/symptoms of physical distress.    Average METs 1.8      Resistance Training   Training Prescription Yes    Weight 2 lbs    Reps 10-15    Time 10 Minutes      Interval Training   Interval Training No      NuStep   Level 2    SPM 40    Minutes 30    METs 1.8  Nutrition:  Target Goals: Understanding of nutrition guidelines, daily intake of sodium <1513m, cholesterol <2040m calories 30% from fat and 7% or less from saturated fats, daily to have 5 or more servings of fruits and vegetables.  Biometrics:  Pre Biometrics - 10/06/21 1100       Pre Biometrics   Waist Circumference 51 inches    Hip Circumference 56 inches    Waist to Hip Ratio 0.91 %    Triceps Skinfold 33 mm    % Body Fat 50.2 %    Grip Strength 24 kg    Flexibility 10.5 in    Single Leg Stand 30 seconds              Nutrition Therapy Plan and Nutrition Goals:  Nutrition Therapy & Goals - 10/24/21 1144       Nutrition Therapy   Diet Heart Healthy Diet    Drug/Food Interactions Statins/Certain Fruits      Personal Nutrition Goals   Nutrition Goal Patient in include lean protein/plant protein, fruits,  vegetables, whole grains, and low fat dairy as part of a heart healthy diet    Personal Goal #2 Patient to identify and limit food sources of saturated fat, trans fat, sodium, and refined carobhydrates    Comments Answered patient's questions regarding glycemic index and food choices (list given). Discussed reading food labels for added sugar. [facility].      Intervention Plan   Intervention Prescribe, educate and counsel regarding individualized specific dietary modifications aiming towards targeted core components such as weight, hypertension, lipid management, diabetes, heart failure and other comorbidities.;Nutrition handout(s) given to patient.    Expected Outcomes Short Term Goal: Understand basic principles of dietary content, such as calories, fat, sodium, cholesterol and nutrients.;Short Term Goal: A plan has been developed with personal nutrition goals set during dietitian appointment.;Long Term Goal: Adherence to prescribed nutrition plan.             Nutrition Assessments:  Nutrition Assessments - 10/14/21 1115       Rate Your Plate Scores   Pre Score 59            MEDIFICTS Score Key: ?70 Need to make dietary changes  40-70 Heart Healthy Diet ? 40 Therapeutic Level Cholesterol Diet   Flowsheet Row CARDIAC [facility]HAB [facility]ASE [facility] [place] Score on [facility] 59      Picture Your Plate Scores: <4<50nhealthy dietary pattern with much room for improvement. 41-50 Dietary pattern unlikely to meet recommendations for good health and room for improvement. 51-60 More healthful dietary pattern, with some room for improvement.  >60 Healthy dietary pattern, although there may be some specific behaviors that could be improved.    Nutrition Goals Re-Evaluation:   Nutrition Goals  Re-Evaluation:   Nutrition Goals Discharge (Final Nutrition Goals Re-Evaluation):   Psychosocial: Target Goals: Acknowledge presence or absence of significant depression and/or stress, maximize coping skills, provide positive support system. Participant is able to verbalize types and ability to use techniques and skills needed for reducing stress and depression.  Initial Review & Psychosocial Screening:  Initial Psych Review & Screening - 10/06/21 1536       Initial Review   Current issues with Current Stress Concerns;History of Depression    Source of Stress Concerns Family;Unable to perform yard/household activities;Unable to participate in former interests or hobbies;Chronic  Illness    Comments Carolyn Salazar is going through a seperation. Carolyn Salazar is on an antidepresant and is not experiencing any depression at this time      [place]? Yes   Carolyn Salazar has her friend and daughter for support     Barriers   Psychosocial barriers to participate in program The patient should benefit from training in stress management and relaxation.      Screening Interventions   Interventions Encouraged to exercise;Provide feedback about the scores to participant    Expected Outcomes Long Term Goal: Stressors or current issues are controlled or eliminated.;Short Term goal: Identification and review with participant of any Quality of Life or Depression concerns found by scoring the questionnaire.;Long Term goal: The participant improves quality of Life and PHQ9 Scores as seen by post scores and/or verbalization of changes             Quality of Life Scores:  Quality of Life - 10/06/21 1340       Quality of Life   Select Quality of Life      Quality of Life Scores   Health/Function Pre 19.93 %    Socioeconomic Pre 28.21 %    Psych/Spiritual Pre 27.5 %    Family Pre 30 %    GLOBAL Pre 24.68 %            Scores of 19 and below usually indicate a poorer quality of life in these  areas.  A difference of  2-3 points is a clinically meaningful difference.  A difference of 2-3 points in the total score of the Quality of Life Index has been associated with significant improvement in overall quality of life, self-image, physical symptoms, and general health in studies assessing change in quality of life.  PHQ-9: Review Flowsheet        10/06/2021 11/29/2017 01/15/2014 08/12/2012  Depression screen PHQ 2/9  Decreased Interest 0 0 0 0  Down, Depressed, Hopeless 0 0 1 0  PHQ - 2 Score 0 0 1 0         Interpretation of Total Score  Total Score Depression Severity:  1-4 = Minimal depression, 5-9 = Mild depression, 10-14 = Moderate depression, 15-19 = Moderately severe depression, 20-27 = Severe depression   Psychosocial Evaluation and Intervention:   Psychosocial Re-Evaluation:  Psychosocial Re-Evaluation     [place] Name 10/11/21 1311 10/24/21 1744           Psychosocial Re-Evaluation   Current issues with Current Stress Concerns;Current Depression Current Stress Concerns;Current Depression      Comments Will review quality of life questionnaire with the patient this week Qaulity of life questionnaire reviewed. Jen's depression is currently controlled by prescribed antidepressants.      Expected Outcomes Carolyn Salazar will have decreased stress upon completion of phase 2 cardiac rehab Carolyn Salazar will have decreased stress upon completion of phase 2 cardiac rehab      Interventions Stress management education;Relaxation education;Encouraged to attend Cardiac Rehabilitation for the exercise Stress management education;Relaxation education;Encouraged to attend Cardiac Rehabilitation for the exercise      Continue Psychosocial Services  Follow up required by staff Follow up required by staff        Initial Review   Source of Stress Concerns Family;Unable to participate in former interests or hobbies;Unable to perform yard/household activities;Chronic Illness Family;Unable to participate in  former interests or hobbies;Unable to perform yard/household activities;Chronic Illness      Comments Will continue to montitor and  offer support as needed Will continue to montitor and offer support as needed               Psychosocial Discharge (Final Psychosocial Re-Evaluation):  Psychosocial Re-Evaluation - 10/24/21 1744       Psychosocial Re-Evaluation   Current issues with Current Stress Concerns;Current Depression    Comments Beverely Risen of life questionnaire reviewed. Jen's depression is currently controlled by prescribed antidepressants.    Expected Outcomes Carolyn Salazar will have decreased stress upon completion of phase 2 cardiac rehab    Interventions Stress management education;Relaxation education;Encouraged to attend Cardiac Rehabilitation for the exercise    Continue Psychosocial Services  Follow up required by staff      Initial Review   Source of Stress Concerns Family;Unable to participate in former interests or hobbies;Unable to perform yard/household activities;Chronic Illness    Comments Will continue to montitor and offer support as needed             Vocational Rehabilitation: Provide vocational rehab assistance to qualifying candidates.   Vocational Rehab Evaluation & Intervention:  Vocational Rehab - 10/06/21 1539       Initial Vocational Rehab Evaluation & Intervention   Assessment shows need for Vocational Rehabilitation No   Carolyn Salazar is disabled and does not need vocational rehab at this time            Education: Education Goals: Education classes will be provided on a weekly basis, covering required topics. Participant will state understanding/return demonstration of topics presented.  Learning Barriers/Preferences:  Learning Barriers/Preferences - 10/06/21 1341       Learning Barriers/Preferences   Learning Barriers Sight   wears glasses   Learning Preferences Computer/Internet;Written Material             Education Topics: Count Your  Pulse:  -Group instruction provided by verbal instruction, demonstration, patient participation and written materials to support subject.  Instructors address importance of being able to find your pulse and how to count your pulse when at home without a heart monitor.  Patients get hands on experience counting their pulse with staff help and individually.   Heart Attack, Angina, and Risk Factor Modification:  -Group instruction provided by verbal instruction, video, and written materials to support subject.  Instructors address signs and symptoms of angina and heart attacks.    Also discuss risk factors for heart disease and how to make changes to improve heart health risk factors.   Functional Fitness:  -Group instruction provided by verbal instruction, demonstration, patient participation, and written materials to support subject.  Instructors address safety measures for doing things around the house.  Discuss how to get up and down off the floor, how to pick things up properly, how to safely get out of a chair without assistance, and balance training.   Meditation and Mindfulness:  -Group instruction provided by verbal instruction, patient participation, and written materials to support subject.  Instructor addresses importance of mindfulness and meditation practice to help reduce stress and improve awareness.  Instructor also leads participants through a meditation exercise.    Stretching for Flexibility and Mobility:  -Group instruction provided by verbal instruction, patient participation, and written materials to support subject.  Instructors lead participants through series of stretches that are designed to increase flexibility thus improving mobility.  These stretches are additional exercise for major muscle groups that are typically performed during regular warm up and cool down.   Hands Only CPR:  -Group verbal, video, and participation provides a basic overview of  AHA guidelines for  community CPR. Role-play of emergencies allow participants the opportunity to practice calling for help and chest compression technique with discussion of AED use.   Hypertension: -Group verbal and written instruction that provides a basic overview of hypertension including the most recent diagnostic guidelines, risk factor reduction with self-care instructions and medication management.    Nutrition I class: Heart Healthy Eating:  -Group instruction provided by PowerPoint slides, verbal discussion, and written materials to support subject matter. The instructor gives an explanation and review of the Therapeutic Lifestyle Changes diet recommendations, which includes a discussion on lipid goals, dietary fat, sodium, fiber, plant stanol/sterol esters, sugar, and the components of a well-balanced, healthy diet.   Nutrition II class: Lifestyle Skills:  -Group instruction provided by PowerPoint slides, verbal discussion, and written materials to support subject matter. The instructor gives an explanation and review of label reading, grocery shopping for heart health, heart healthy recipe modifications, and ways to make healthier choices when eating out.   Diabetes Question & Answer:  -Group instruction provided by PowerPoint slides, verbal discussion, and written materials to support subject matter. The instructor gives an explanation and review of diabetes co-morbidities, pre- and post-prandial blood glucose goals, pre-exercise blood glucose goals, signs, symptoms, and treatment of hypoglycemia and hyperglycemia, and foot care basics.   Diabetes Blitz:  -Group instruction provided by PowerPoint slides, verbal discussion, and written materials to support subject matter. The instructor gives an explanation and review of the physiology behind type 1 and type 2 diabetes, diabetes medications and rational behind using different medications, pre- and post-prandial blood glucose recommendations and  Hemoglobin A1c goals, diabetes diet, and exercise including blood glucose guidelines for exercising safely.    Portion Distortion:  -Group instruction provided by PowerPoint slides, verbal discussion, written materials, and food models to support subject matter. The instructor gives an explanation of serving size versus portion size, changes in portions sizes over the last 20 years, and what consists of a serving from each food group.   Stress Management:  -Group instruction provided by verbal instruction, video, and written materials to support subject matter.  Instructors review role of stress in heart disease and how to cope with stress positively.     Exercising on Your Own:  -Group instruction provided by verbal instruction, power point, and written materials to support subject.  Instructors discuss benefits of exercise, components of exercise, frequency and intensity of exercise, and end points for exercise.  Also discuss use of nitroglycerin and activating EMS.  Review options of places to exercise outside of rehab.  Review guidelines for sex with heart disease.   Cardiac Drugs I:  -Group instruction provided by verbal instruction and written materials to support subject.  Instructor reviews cardiac drug classes: antiplatelets, anticoagulants, beta blockers, and statins.  Instructor discusses reasons, side effects, and lifestyle considerations for each drug class.   Cardiac Drugs II:  -Group instruction provided by verbal instruction and written materials to support subject.  Instructor reviews cardiac drug classes: angiotensin converting enzyme inhibitors (ACE-I), angiotensin II receptor blockers (ARBs), nitrates, and calcium channel blockers.  Instructor discusses reasons, side effects, and lifestyle considerations for each drug class.   Anatomy and Physiology of the Circulatory System:  Group verbal and written instruction and models provide basic cardiac anatomy and physiology,  with the coronary electrical and arterial systems. Review of: AMI, Angina, Valve disease, Heart Failure, Peripheral Artery Disease, Cardiac Arrhythmia, Pacemakers, and the ICD.   Other Education:  -Group or individual verbal,  written, or video instructions that support the educational goals of the cardiac rehab program.   Holiday Eating Survival Tips:  -Group instruction provided by PowerPoint slides, verbal discussion, and written materials to support subject matter. The instructor gives patients tips, tricks, and techniques to help them not only survive but enjoy the holidays despite the onslaught of food that accompanies the holidays.   Knowledge Questionnaire Score:  Knowledge Questionnaire Score - 10/06/21 1341       Knowledge Questionnaire Score   Pre Score 20/24             Core Components/Risk Factors/Patient Goals at Admission:  Personal Goals and Risk Factors at Admission - 10/06/21 1346       Core Components/Risk Factors/Patient Goals on Admission    Weight Management Yes;Obesity;Weight Loss    Intervention Weight Management: Develop a combined nutrition and exercise program designed to reach desired caloric intake, while maintaining appropriate intake of nutrient and fiber, sodium and fats, and appropriate energy expenditure required for the weight goal.;Weight Management: Provide education and appropriate resources to help participant work on and attain dietary goals.;Weight Management/Obesity: Establish reasonable short term and long term weight goals.;Obesity: Provide education and appropriate resources to help participant work on and attain dietary goals.    Admit Weight 246 lb 14.6 oz (112 kg)    Expected Outcomes Short Term: Continue to assess and modify interventions until short term weight is achieved;Long Term: Adherence to nutrition and physical activity/exercise program aimed toward attainment of established weight goal;Weight Maintenance: Understanding of the  daily nutrition guidelines, which includes 25-35% calories from fat, 7% or less cal from saturated fats, less than 287m cholesterol, less than 1.5gm of sodium, & 5 or more servings of fruits and vegetables daily;Weight Loss: Understanding of general recommendations for a balanced deficit meal plan, which promotes 1-2 lb weight loss per week and includes a negative energy balance of [phone] kcal/d;Understanding recommendations for meals to include 15-35% energy as protein, 25-35% energy from fat, 35-60% energy from carbohydrates, less than 2085mof dietary cholesterol, 20-35 gm of total fiber daily;Understanding of distribution of calorie intake throughout the day with the consumption of 4-5 meals/snacks    Improve shortness of breath with ADL's Yes    Intervention Provide education, individualized exercise plan and daily activity instruction to help decrease symptoms of SOB with activities of daily living.    Expected Outcomes Short Term: Improve cardiorespiratory fitness to achieve a reduction of symptoms when performing ADLs;Long Term: Be able to perform more ADLs without symptoms or delay the onset of symptoms    Diabetes Yes    Intervention Provide education about signs/symptoms and action to take for hypo/hyperglycemia.;Provide education about proper nutrition, including hydration, and aerobic/resistive exercise prescription along with prescribed medications to achieve blood glucose in normal ranges: Fasting glucose 65-99 mg/dL    Expected Outcomes Short Term: Participant verbalizes understanding of the signs/symptoms and immediate care of hyper/hypoglycemia, proper foot care and importance of medication, aerobic/resistive exercise and nutrition plan for blood glucose control.;Long Term: Attainment of HbA1C < 7%.    Heart Failure Yes    Intervention Provide a combined exercise and nutrition program that is supplemented with education, support and counseling about heart failure. Directed toward  relieving symptoms such as shortness of breath, decreased exercise tolerance, and extremity edema.    Expected Outcomes Improve functional capacity of life;Short term: Attendance in program 2-3 days a week with increased exercise capacity. Reported lower sodium intake. Reported increased fruit and vegetable intake. Reports  medication compliance.;Short term: Daily weights obtained and reported for increase. Utilizing diuretic protocols set by physician.;Long term: Adoption of self-care skills and reduction of barriers for early signs and symptoms recognition and intervention leading to self-care maintenance.    Hypertension Yes    Intervention Provide education on lifestyle modifcations including regular physical activity/exercise, weight management, moderate sodium restriction and increased consumption of fresh fruit, vegetables, and low fat dairy, alcohol moderation, and smoking cessation.;Monitor prescription use compliance.    Expected Outcomes Short Term: Continued assessment and intervention until BP is < 140/83m HG in hypertensive participants. < 130/874mHG in hypertensive participants with diabetes, heart failure or chronic kidney disease.;Long Term: Maintenance of blood pressure at goal levels.    Lipids Yes    Intervention Provide education and support for participant on nutrition & aerobic/resistive exercise along with prescribed medications to achieve LDL <7070mHDL >74m23m  Expected Outcomes Short Term: Participant states understanding of desired cholesterol values and is compliant with medications prescribed. Participant is following exercise prescription and nutrition guidelines.;Long Term: Cholesterol controlled with medications as prescribed, with individualized exercise RX and with personalized nutrition plan. Value goals: LDL < 70mg26mL > 40 mg.    Stress Yes    Intervention Offer individual and/or small group education and counseling on adjustment to heart disease, stress management  and health-related lifestyle change. Teach and support self-help strategies.;Refer participants experiencing significant psychosocial distress to appropriate mental health specialists for further evaluation and treatment. When possible, include family members and significant others in education/counseling sessions.    Expected Outcomes Short Term: Participant demonstrates changes in health-related behavior, relaxation and other stress management skills, ability to obtain effective social support, and compliance with psychotropic medications if prescribed.;Long Term: Emotional wellbeing is indicated by absence of clinically significant psychosocial distress or social isolation.             Core Components/Risk Factors/Patient Goals Review:   Goals and Risk Factor Review     Row Name 10/11/21 1315 10/24/21 1746           Core Components/Risk Factors/Patient Goals Review   Personal Goals Review Weight Management/Obesity;Heart Failure;Stress;Hypertension;Improve shortness of breath with ADL's;Diabetes;Lipids Weight Management/Obesity;Heart Failure;Stress;Hypertension;Improve shortness of breath with ADL's;Diabetes;Lipids      Review Jen sDelsa Saleted cardiac rehab on 10/10/21. Jen dDelsa Salewell with exercise for her fitness level. Jen iDelsa Saleeconditoined. Vital signs and CBG's were stable Jen started cardiac rehab on 10/10/21. Jen iDelsa Saleff to a good start to exercise. Vital signs and CBG's have been stable. Jen rDelsa Salerts that she does not feel as short of breath when she goes up and down the stairs since starting cardiac rehab.      Expected Outcomes Jen wDelsa Salazar continue to participate in phase 2 cardiac rehab for exercise, nutrition and lifestyle modifications Jen wDelsa Salazar continue to participate in phase 2 cardiac rehab for exercise, nutrition and lifestyle modifications               Core Components/Risk Factors/Patient Goals at Discharge (Final Review):   Goals and Risk Factor Review - 10/24/21 1746        Core Components/Risk Factors/Patient Goals Review   Personal Goals Review Weight Management/Obesity;Heart Failure;Stress;Hypertension;Improve shortness of breath with ADL's;Diabetes;Lipids    Review Jen sDelsa Saleted cardiac rehab on 10/10/21. Jen iDelsa Saleff to a good start to exercise. Vital signs and CBG's have been stable. Jen rDelsa Salerts that she does not feel as short of breath when she goes up and down the stairs since starting cardiac  rehab.    Expected Outcomes Carolyn Salazar will continue to participate in phase 2 cardiac rehab for exercise, nutrition and lifestyle modifications             ITP Comments:  ITP Comments     Row Name 10/06/21 1535 10/11/21 1307 10/24/21 1743       ITP Comments Dr Fransico Him MD, Medical Director 30 Day ITP Review. Carolyn Salazar started cardiac rehab on 10/10/21. Carolyn Salazar did well with exercise for her fitness level 30 Day ITP Review. Carolyn Salazar is off to a good start to exercise at cardiac rehab              Comments: See ITP Comments

## 2021-10-26 ENCOUNTER — Encounter (HOSPITAL_COMMUNITY)
Admission: RE | Admit: 2021-10-26 | Discharge: 2021-10-26 | Disposition: A | Discharge status: Home / Self Care | Payer: Managed Care, Other (non HMO) | Source: Ambulatory Visit | Attending: Cardiology | Admitting: Cardiology

## 2021-10-26 DIAGNOSIS — I5022 Chronic systolic (congestive) heart failure: Secondary | ICD-10-CM

## 2021-10-28 ENCOUNTER — Encounter (HOSPITAL_COMMUNITY)
Admission: RE | Admit: 2021-10-28 | Discharge: 2021-10-28 | Disposition: A | Discharge status: Home / Self Care | Payer: Managed Care, Other (non HMO) | Source: Ambulatory Visit | Attending: Cardiology | Admitting: Cardiology

## 2021-10-28 DIAGNOSIS — I5022 Chronic systolic (congestive) heart failure: Secondary | ICD-10-CM

## 2021-10-28 NOTE — Progress Notes (Signed)
Reviewed home exercise guidelines with patient including endpoints, temperature precautions, target heart rate and rate of perceived exertion. [facility], Wednesday, and Friday and water aerobics 1 day/week as her mode of home exercise. Patient's plan is to eventually resume swimming and water aerobics. Will continue to progress workloads at cardiac rehab to help build stamina and confidence to return to previous exercise activity. Encouraged patient to exercise as tolerated water walking, slow swimming, and/or walking at home. Patient voices understanding of instructions given.  Sol Passer, MS, ACSM CEP

## 2021-10-31 ENCOUNTER — Encounter (HOSPITAL_COMMUNITY): Payer: Managed Care, Other (non HMO)

## 2021-10-31 ENCOUNTER — Encounter (HOSPITAL_COMMUNITY)
Admission: RE | Admit: 2021-10-31 | Discharge: 2021-10-31 | Disposition: A | Discharge status: Home / Self Care | Payer: Managed Care, Other (non HMO) | Source: Ambulatory Visit | Attending: Cardiology | Admitting: Cardiology

## 2021-10-31 DIAGNOSIS — I5022 Chronic systolic (congestive) heart failure: Secondary | ICD-10-CM | POA: Diagnosis not present

## 2021-11-02 ENCOUNTER — Encounter (HOSPITAL_COMMUNITY): Payer: Managed Care, Other (non HMO)

## 2021-11-02 ENCOUNTER — Encounter (HOSPITAL_COMMUNITY)
Admission: RE | Admit: 2021-11-02 | Discharge: 2021-11-02 | Disposition: A | Discharge status: Home / Self Care | Payer: Managed Care, Other (non HMO) | Source: Ambulatory Visit | Attending: Cardiology | Admitting: Cardiology

## 2021-11-02 DIAGNOSIS — I5022 Chronic systolic (congestive) heart failure: Secondary | ICD-10-CM | POA: Diagnosis not present

## 2021-11-03 ENCOUNTER — Encounter: Payer: Self-pay | Admitting: Family Medicine

## 2021-11-03 DIAGNOSIS — G43829 Menstrual migraine, not intractable, without status migrainosus: Secondary | ICD-10-CM

## 2021-11-03 MED ORDER — UBRELVY 100 MG PO TABS: ORAL_TABLET | ORAL | 0 refills | Status: DC

## 2021-11-04 ENCOUNTER — Encounter (HOSPITAL_COMMUNITY): Payer: Managed Care, Other (non HMO)

## 2021-11-04 ENCOUNTER — Encounter (HOSPITAL_COMMUNITY)
Admission: RE | Admit: 2021-11-04 | Discharge: 2021-11-04 | Disposition: A | Discharge status: Home / Self Care | Payer: Managed Care, Other (non HMO) | Source: Ambulatory Visit | Attending: Cardiology | Admitting: Cardiology

## 2021-11-04 DIAGNOSIS — I5022 Chronic systolic (congestive) heart failure: Secondary | ICD-10-CM

## 2021-11-07 ENCOUNTER — Encounter (HOSPITAL_COMMUNITY)
Admission: RE | Admit: 2021-11-07 | Discharge: 2021-11-07 | Disposition: A | Discharge status: Home / Self Care | Payer: Managed Care, Other (non HMO) | Source: Ambulatory Visit | Attending: Cardiology | Admitting: Cardiology

## 2021-11-07 ENCOUNTER — Encounter (HOSPITAL_COMMUNITY): Payer: Managed Care, Other (non HMO)

## 2021-11-07 DIAGNOSIS — I5022 Chronic systolic (congestive) heart failure: Secondary | ICD-10-CM

## 2021-11-09 ENCOUNTER — Encounter (HOSPITAL_COMMUNITY): Payer: Managed Care, Other (non HMO)

## 2021-11-09 ENCOUNTER — Encounter (HOSPITAL_COMMUNITY)
Admission: RE | Admit: 2021-11-09 | Discharge: 2021-11-09 | Disposition: A | Discharge status: Home / Self Care | Payer: Managed Care, Other (non HMO) | Source: Ambulatory Visit | Attending: Cardiology | Admitting: Cardiology

## 2021-11-09 DIAGNOSIS — I5022 Chronic systolic (congestive) heart failure: Secondary | ICD-10-CM | POA: Diagnosis not present

## 2021-11-11 ENCOUNTER — Encounter (HOSPITAL_COMMUNITY)
Admission: RE | Admit: 2021-11-11 | Discharge: 2021-11-11 | Disposition: A | Discharge status: Home / Self Care | Payer: Managed Care, Other (non HMO) | Source: Ambulatory Visit | Attending: Cardiology | Admitting: Cardiology

## 2021-11-11 ENCOUNTER — Encounter (HOSPITAL_COMMUNITY): Payer: Managed Care, Other (non HMO)

## 2021-11-11 DIAGNOSIS — I5022 Chronic systolic (congestive) heart failure: Secondary | ICD-10-CM

## 2021-11-14 ENCOUNTER — Encounter (HOSPITAL_COMMUNITY)
Admission: RE | Admit: 2021-11-14 | Discharge: 2021-11-14 | Disposition: A | Discharge status: Home / Self Care | Payer: Managed Care, Other (non HMO) | Source: Ambulatory Visit | Attending: Cardiology | Admitting: Cardiology

## 2021-11-14 ENCOUNTER — Encounter (HOSPITAL_COMMUNITY): Payer: Managed Care, Other (non HMO)

## 2021-11-14 DIAGNOSIS — I5022 Chronic systolic (congestive) heart failure: Secondary | ICD-10-CM | POA: Diagnosis not present

## 2021-11-16 ENCOUNTER — Telehealth (HOSPITAL_COMMUNITY): Payer: Self-pay | Admitting: Family Medicine

## 2021-11-16 ENCOUNTER — Encounter (HOSPITAL_COMMUNITY): Payer: Managed Care, Other (non HMO)

## 2021-11-16 ENCOUNTER — Encounter (HOSPITAL_COMMUNITY)
Admission: RE | Admit: 2021-11-16 | Discharge: 2021-11-16 | Disposition: A | Discharge status: Home / Self Care | Payer: Managed Care, Other (non HMO) | Source: Ambulatory Visit | Attending: Cardiology | Admitting: Cardiology

## 2021-11-16 ENCOUNTER — Telehealth: Payer: Self-pay | Admitting: Cardiology

## 2021-11-16 DIAGNOSIS — I5022 Chronic systolic (congestive) heart failure: Secondary | ICD-10-CM

## 2021-11-16 NOTE — Progress Notes (Signed)
Delsa Sale reports feeling increased shortness of breath today. Weight today noted at 114.3 kg Blood pressure 96/63 heart 94. Oxygen saturation 96-97% on room air. Upon assessment fine crackles noted in the left posterior base otherwise clear. No peripheral edema noted. Abdomen noted to be somewhat distended. Reino Bellis NP paged and notified. Ria Comment spoke with Delsa Sale over the phone and told Delsa Sale to increased her furosemide to 40 mg twice a day for two days and to hold her evening entresto dose if her systolic blood pressure is less than 90. Patient states understanding.Will fax exercise flow sheets to Dr. Terrial Rhodes office for review. Delsa Sale has gained 2.4 kg since starting cardiac rehab on 10/10/21.Harrell Gave RN BSN

## 2021-11-16 NOTE — Telephone Encounter (Signed)
Maria from Cardiac Rehab called in requesting to speak with Dr. [name]. I advised her she is currently out of office and will not be back until next week. She states she was just wanting to report this pt had weight gain and SOB, but a NP called the patient and spoke with her regarding it. She is sending the flow sheets over for this to "ATTN: [facility]" for Dr. [name] to review when she returns. She states a callback is not necessary.

## 2021-11-16 NOTE — Progress Notes (Signed)
Cardiac Individual Treatment Plan  Patient Details  Name: Carolyn Salazar MRN: [id number] Date of Birth: Nov 04, 1970 Referring Provider:   Flowsheet Row CARDIAC REHAB PHASE II ORIENTATION from 10/06/2021 in [place]  Referring Provider Carolyn Salines, MD       Initial Encounter Date:  Flowsheet Row CARDIAC REHAB PHASE II ORIENTATION from 10/06/2021 in [place]  Date 10/06/21       Visit Diagnosis: Heart failure, chronic systolic (HCC)  Patient's Home Medications on Admission:  Current Outpatient Medications:    ARIPiprazole (ABILIFY) 15 MG tablet, Take 15 mg by mouth daily., Disp: , Rfl:    aspirin-acetaminophen-caffeine (EXCEDRIN MIGRAINE) 250-250-65 MG tablet, Take 3 tablets by mouth daily as needed for headache., Disp: , Rfl:    atorvastatin (LIPITOR) 10 MG tablet, TAKE 1 TABLET BY MOUTH EVERY DAY, Disp: 90 tablet, Rfl: 2   b complex vitamins tablet, Take 1 tablet by mouth daily., Disp: , Rfl:    carvedilol (COREG) 25 MG tablet, TAKE 1 TABLET (25 MG TOTAL) BY MOUTH 2 (TWO) TIMES DAILY WITH A MEAL. KEEP FOLLOW UP APPOINTMENT FOR FUTURE REFILLS., Disp: 180 tablet, Rfl: 3   cetirizine (ZYRTEC) 10 MG tablet, Take 10 mg by mouth daily.  , Disp: , Rfl:    Cholecalciferol (DIALYVITE VITAMIN D 5000) 125 MCG (5000 UT) capsule, Take 5,000 Units by mouth daily., Disp: , Rfl:    Collagen-Boron-Hyaluronic Acid (MOVE FREE ULTRA JOINT HEALTH) 40-5-3.3 MG TABS, Take 1 tablet by mouth daily., Disp: , Rfl:    Continuous Blood Gluc Sensor (FREESTYLE LIBRE 3 SENSOR) MISC, 1 EACH BY DOES NOT APPLY ROUTE EVERY 14 (FOURTEEN) DAYS., Disp: 6 each, Rfl: 1   diclofenac Sodium (VOLTAREN) 1 % GEL, Apply 1 application. topically daily as needed (pain)., Disp: , Rfl:    empagliflozin (JARDIANCE) 25 MG TABS tablet, Take 1 tablet (25 mg total) by mouth daily., Disp: 90 tablet, Rfl: 1   esomeprazole (NEXIUM) 20 MG capsule, Take 20 mg by mouth daily.,  Disp: , Rfl:    Eszopiclone 3 MG TABS, Take 3 mg by mouth at bedtime., Disp: , Rfl:    FLUoxetine (PROZAC) 40 MG capsule, Take 80 mg by mouth daily., Disp: , Rfl:    furosemide (LASIX) 40 MG tablet, Take 1 tablet (40 mg total) by mouth daily., Disp: 90 tablet, Rfl: 3   Glucose Blood (BLOOD GLUCOSE TEST STRIPS) STRP, Test 1-2 times a day. Pt uses onetouch verio flex meter. Dx e11.9, Disp: 200 strip, Rfl: 2   glucose blood test strip, Test once a day. Pt uses one touch verio meter, Disp: 100 each, Rfl: 3   lamoTRIgine (LAMICTAL) 200 MG tablet, Take 400 mg by mouth daily., Disp: , Rfl:    Lancets (ONETOUCH DELICA PLUS CHENID78E) MISC, Test 1-2 times a day. Pt uses one touch verio flex meter dx e11.9, Disp: 200 each, Rfl: 1   metFORMIN (GLUCOPHAGE) 1000 MG tablet, Take 1 tablet (1,000 mg total) by mouth 2 (two) times daily with a meal., Disp: 180 tablet, Rfl: 1   olopatadine (PATANOL) 0.1 % ophthalmic solution, Place 1 drop into both eyes 2 (two) times daily as needed for allergies., Disp: , Rfl:    Omega-3 Fatty Acids (FISH OIL) 1000 MG CAPS, Take 3,000 mg by mouth daily., Disp: , Rfl:    Probiotic Product (PROBIOTIC PO), Take 2 capsules by mouth daily., Disp: , Rfl:    sacubitril-valsartan (ENTRESTO) 24-26 MG, Take 1 tablet by  mouth 2 (two) times daily., Disp: 180 tablet, Rfl: 3   spironolactone (ALDACTONE) 25 MG tablet, Take 0.5 tablets (12.5 mg total) by mouth daily., Disp: 45 tablet, Rfl: 3   Ubrogepant (UBRELVY) 100 MG TABS, Take 1 tablet at onset of migraine.  May repeat in 2 hours if needed (max dose 251m/24 hr), Disp: 15 tablet, Rfl: 0   KLOR-CON M20 20 MEQ tablet, TAKE 1 TABLET BY MOUTH EVERY DAY, Disp: 90 tablet, Rfl: 2  Current Facility-Administered Medications:    levonorgestrel (MIRENA) 20 MCG/24HR IUD, , Intrauterine, Once, FTerrance Mass MD  Past Medical History: Past Medical History:  Diagnosis Date   Allergy    Arthritis    Hands, Knees RT>LT   BENIGN NEOPLASM OF SKIN  SITE UNSPECIFIED    benign mole   Blood transfusion without reported diagnosis    BUNIONS, BILATERAL    CARDIOMYOPATHY 02/1999   EF 20% in 02/1999, improved over time-  EF 40-45% at cath 2017   Chest pain    normal coronaries 2000 and 2017 (after an abnormal Myoview)   CHF (congestive heart failure) ([facility]    DEPRESSION    DIABETES MELLITUS, TYPE II, CONTROLLED, MILD    DYSLIPIDEMIA    GERD (gastroesophageal reflux disease)    Hypotension    November, 21610  METABOLIC SYNDROME X    hypertriglycerides 04/2008, hyperglycemia   MIGRAINE HEADACHE    NASH (nonalcoholic steatohepatitis)    OBESITY    Shingles (herpes zoster) polyneuropathy    Sinus tachycardia     Tobacco Use: Social History   Tobacco Use  Smoking Status Never  Smokeless Tobacco Never    Labs: Review Flowsheet  More data exists      Latest Ref Rng & Units 12/01/2020 03/01/2021 05/18/2021 06/06/2021  Labs for ITP Cardiac and Pulmonary Rehab  Cholestrol 100 - 199 mg/dL - 141  - 170   LDL (calc) 0 - 99 mg/dL - 63  - 88   HDL-C >39 mg/dL - 40  - 47   Trlycerides 0 - 149 mg/dL - 235  - 206   Hemoglobin A1c 4.0 - 5.6 % 6.9  6.9  - 7.4   PH, Arterial 7.350 - 7.450 - - 7.388  -  PCO2 arterial 32.0 - 48.0 mmHg - - 42.5  -  Bicarbonate 20.0 - 28.0 mmol/L - - 26.9  25.6  -  TCO2 22 - 32 mmol/L - - 28  27  -  O2 Saturation % - - 67.0  97.0  -      09/07/2021  Labs for ITP Cardiac and Pulmonary Rehab  Cholestrol -  LDL (calc) -  HDL-C -  Trlycerides -  Hemoglobin A1c 6.4   PH, Arterial -  PCO2 arterial -  Bicarbonate -  TCO2 -  O2 Saturation -    Capillary Blood Glucose: Lab Results  Component Value Date   GLUCAP 126 (H) 10/10/2021   GLUCAP 169 (H) 10/10/2021   GLUCAP 163 (H) 05/18/2021   GLUCAP 208 (H) 05/18/2021   GLUCAP 141 (H) 08/25/2015     Exercise Target Goals: Exercise Program Goal: Individual exercise prescription set using results from initial 6 min walk test and THRR while considering   patient's activity barriers and safety.   Exercise Prescription Goal: Initial exercise prescription builds to 30-45 minutes a day of aerobic activity, 2-3 days per week.  Home exercise guidelines will be given to patient during program as part of exercise prescription that the participant  will acknowledge.  Activity Barriers & Risk Stratification:  Activity Barriers & Cardiac Risk Stratification - 10/06/21 1350       Activity Barriers & Cardiac Risk Stratification   Activity Barriers Decreased Ventricular Function;Deconditioning;Shortness of Breath    Cardiac Risk Stratification High             6 Minute Walk:  6 Minute Walk     Row Name 10/06/21 1125         6 Minute Walk   Phase Initial     Distance 800 feet     Walk Time 6 minutes     # of Rest Breaks 1  5:43-6:00 minutes     METS 2.4     RPE 12     Perceived Dyspnea  2     VO2 Peak 8.26     Symptoms Yes (comment)     Comments SOB, RPD = 2     Resting HR 97 bpm     Resting BP 106/70     Resting Oxygen Saturation  98 %     Exercise Oxygen Saturation  during 6 min walk 98 %     Max Ex. HR 111 bpm     Max Ex. BP 110/80     2 Minute Post BP 100/70              Oxygen Initial Assessment:   Oxygen Re-Evaluation:   Oxygen Discharge (Final Oxygen Re-Evaluation):   Initial Exercise Prescription:  Initial Exercise Prescription - 10/06/21 1300       Date of Initial Exercise RX and Referring Provider   Date 10/06/21    Referring Provider Carolyn Salines, MD      NuStep   Level 1    SPM 75    Minutes 25    METs 2.4      Prescription Details   Frequency (times per week) 3    Duration Progress to 10 minutes continuous walking  at current work load and total walking time to 30-45 min      Intensity   Ratings of Perceived Exertion 11-13    Perceived Dyspnea 0-4      Progression   Progression Continue progressive overload as per policy without signs/symptoms or physical distress.      Resistance  Training   Training Prescription Yes    Weight 2 lbs    Reps 10-15             Perform Capillary Blood Glucose checks as needed.  Exercise Prescription Changes:   Exercise Prescription Changes     Row Name 10/10/21 1028 10/24/21 1016 11/07/21 1023         Response to Exercise   Blood Pressure (Admit) 104/60 98/68 106/82     Blood Pressure (Exercise) 110/72 100/62 104/72     Blood Pressure (Exit) 104/78 105/72 98/72     Heart Rate (Admit) 91 bpm 97 bpm 95 bpm     Heart Rate (Exercise) 106 bpm 107 bpm 113 bpm     Heart Rate (Exit) 95 bpm 97 bpm 95 bpm     Rating of Perceived Exertion (Exercise) 12 13 11      Symptoms None None None     Comments Off to a good start with exercise. Increased WL on Nustep. Increased WL on Nustep.     Duration Continue with 30 min of aerobic exercise without signs/symptoms of physical distress. Continue with 30 min of aerobic exercise without signs/symptoms of physical distress. Continue  with 30 min of aerobic exercise without signs/symptoms of physical distress.     Intensity THRR unchanged THRR unchanged THRR unchanged       Progression   Progression Continue to progress workloads to maintain intensity without signs/symptoms of physical distress. Continue to progress workloads to maintain intensity without signs/symptoms of physical distress. Continue to progress workloads to maintain intensity without signs/symptoms of physical distress.     Average METs 1.6 1.8 1.9       Resistance Training   Training Prescription Yes Yes Yes     Weight 2 lbs 2 lbs 2 lbs     Reps 10-15 10-15 10-15     Time 10 Minutes 10 Minutes 10 Minutes       Interval Training   Interval Training No No No       NuStep   Level 1 2 3      SPM 40 40 40     Minutes 30 30 30      METs 1.6 1.8 1.9       Home Exercise Plan   Plans to continue exercise at -- -- Home (comment)  Walking     Frequency -- -- Add 2 additional days to program exercise sessions.     Initial Home  Exercises Provided -- -- 10/28/21              Exercise Comments:   Exercise Comments     Row Name 10/10/21 1139 10/24/21 1018 10/28/21 1038       Exercise Comments Patient tolerated low intensity exercise well without symptoms. Reviewed METs and goals with patient. Increased WL on SE from 1.0 to 2.0. Reviewed home exercise guidelines, METs, and goals with patient.              Exercise Goals and Review:   Exercise Goals     Row Name 10/06/21 1354             Exercise Goals   Increase Physical Activity Yes       Intervention Provide advice, education, support and counseling about physical activity/exercise needs.;Develop an individualized exercise prescription for aerobic and resistive training based on initial evaluation findings, risk stratification, comorbidities and participant's personal goals.       Expected Outcomes Short Term: Attend rehab on a regular basis to increase amount of physical activity.;Long Term: Add in home exercise to make exercise part of routine and to increase amount of physical activity.;Long Term: Exercising regularly at least 3-5 days a week.       Increase Strength and Stamina Yes       Intervention Provide advice, education, support and counseling about physical activity/exercise needs.;Develop an individualized exercise prescription for aerobic and resistive training based on initial evaluation findings, risk stratification, comorbidities and participant's personal goals.       Expected Outcomes Short Term: Increase workloads from initial exercise prescription for resistance, speed, and METs.;Short Term: Perform resistance training exercises routinely during rehab and add in resistance training at home;Long Term: Improve cardiorespiratory fitness, muscular endurance and strength as measured by increased METs and functional capacity (6MWT)       Able to understand and use rate of perceived exertion (RPE) scale Yes       Intervention Provide  education and explanation on how to use RPE scale       Expected Outcomes Short Term: Able to use RPE daily in rehab to express subjective intensity level;Long Term:  Able to use RPE to guide intensity level when  exercising independently       Able to understand and use Dyspnea scale Yes       Intervention Provide education and explanation on how to use Dyspnea scale       Expected Outcomes Short Term: Able to use Dyspnea scale daily in rehab to express subjective sense of shortness of breath during exertion;Long Term: Able to use Dyspnea scale to guide intensity level when exercising independently       Knowledge and understanding of Target Heart Rate Range (THRR) Yes       Intervention Provide education and explanation of THRR including how the numbers were predicted and where they are located for reference       Expected Outcomes Short Term: Able to state/look up THRR;Long Term: Able to use THRR to govern intensity when exercising independently;Short Term: Able to use daily as guideline for intensity in rehab       Understanding of Exercise Prescription Yes       Intervention Provide education, explanation, and written materials on patient's individual exercise prescription       Expected Outcomes Short Term: Able to explain program exercise prescription;Long Term: Able to explain home exercise prescription to exercise independently                Exercise Goals Re-Evaluation :  Exercise Goals Re-Evaluation     Row Name 10/10/21 1139 10/24/21 1018 10/28/21 1038         Exercise Goal Re-Evaluation   Exercise Goals Review Increase Physical Activity;Able to understand and use rate of perceived exertion (RPE) scale Increase Physical Activity;Able to understand and use rate of perceived exertion (RPE) scale Increase Physical Activity;Able to understand and use rate of perceived exertion (RPE) scale;Understanding of Exercise Prescription;Increase Strength and Stamina;Knowledge and  understanding of Target Heart Rate Range (THRR)     Comments Patient able to understand and use RPE scale appropriately. Patient is making gradual progress with exercise. Patient's goal is to decrease SOB with ADLs. Increased WL on NuStep and tolerated well. Reviewed exercise prescription with patient. [facility], Wednesday, and Friday and water aerobics 1 day/week as her mode of home exercise. Patient's plan is to eventually resume swimming and water aerobics. Will continue to progress workloads at cardiac rehab to help build stamina and confidence to return to previous exercise activity. Encouraged patient to exercise as tolerated water walking, slow swimming, and/or walking at home. Patient states that she's not as short of breath now as previously and she was able to tolerate doing her laundry yesterday and got some steps in while doing her laundry.     Expected Outcomes Progress workloads as tolerated to help improve endurance and increase confidence with exercise. Continue to progress workloads as tolerated to help improve cardiorespiratory fitness and decrease SOB with ADLs. Patient will add home exercise as tolerated to help build stamina to resume swimming and water aerobics.              Discharge Exercise Prescription (Final Exercise Prescription Changes):  Exercise Prescription Changes - 11/07/21 1023       Response to Exercise   Blood Pressure (Admit) 106/82    Blood Pressure (Exercise) 104/72    Blood Pressure (Exit) 98/72    Heart Rate (Admit) 95 bpm    Heart Rate (Exercise) 113 bpm    Heart Rate (Exit) 95 bpm    Rating of Perceived Exertion (Exercise) 11    Symptoms None  Comments Increased WL on Nustep.    Duration Continue with 30 min of aerobic exercise without signs/symptoms of physical distress.    Intensity THRR unchanged      Progression   Progression Continue to progress workloads to maintain intensity  without signs/symptoms of physical distress.    Average METs 1.9      Resistance Training   Training Prescription Yes    Weight 2 lbs    Reps 10-15    Time 10 Minutes      Interval Training   Interval Training No      NuStep   Level 3    SPM 40    Minutes 30    METs 1.9      Home Exercise Plan   Plans to continue exercise at Home (comment)   Walking   Frequency Add 2 additional days to program exercise sessions.    Initial Home Exercises Provided 10/28/21             Nutrition:  Target Goals: Understanding of nutrition guidelines, daily intake of sodium <154m, cholesterol <2068m calories 30% from fat and 7% or less from saturated fats, daily to have 5 or more servings of fruits and vegetables.  Biometrics:  Pre Biometrics - 10/06/21 1100       Pre Biometrics   Waist Circumference 51 inches    Hip Circumference 56 inches    Waist to Hip Ratio 0.91 %    Triceps Skinfold 33 mm    % Body Fat 50.2 %    Grip Strength 24 kg    Flexibility 10.5 in    Single Leg Stand 30 seconds              Nutrition Therapy Plan and Nutrition Goals:  Nutrition Therapy & Goals - 10/24/21 1144       Nutrition Therapy   Diet Heart Healthy Diet    Drug/Food Interactions Statins/Certain Fruits      Personal Nutrition Goals   Nutrition Goal Patient in include lean protein/plant protein, fruits, vegetables, whole grains, and low fat dairy as part of a heart healthy diet    Personal Goal #2 Patient to identify and limit food sources of saturated fat, trans fat, sodium, and refined carobhydrates    Comments Answered patient's questions regarding glycemic index and food choices (list given). Discussed reading food labels for added sugar. [facility] knowledge.      Intervention Plan   Intervention Prescribe, educate and counsel regarding individualized specific dietary modifications aiming towards  targeted core components such as weight, hypertension, lipid management, diabetes, heart failure and other comorbidities.;Nutrition handout(s) given to patient.    Expected Outcomes Short Term Goal: Understand basic principles of dietary content, such as calories, fat, sodium, cholesterol and nutrients.;Short Term Goal: A plan has been developed with personal nutrition goals set during dietitian appointment.;Long Term Goal: Adherence to prescribed nutrition plan.             Nutrition Assessments:  Nutrition Assessments - 10/14/21 1115       Rate Your Plate Scores   Pre Score 59            MEDIFICTS Score Key: ?70 Need to make dietary changes  40-70 Heart Healthy Diet ? 40 Therapeutic Level Cholesterol Diet   Flowsheet Row CARDIAC REHAB [facility]ASE [facility] [place] [facility] on [facility]  75      Picture Your Plate Scores: <54 Unhealthy dietary pattern with much room for improvement. 41-50 Dietary pattern unlikely to meet recommendations for good health and room for improvement. 51-60 More healthful dietary pattern, with some room for improvement.  >60 Healthy dietary pattern, although there may be some specific behaviors that could be improved.    Nutrition Goals Re-Evaluation:   Nutrition Goals Re-Evaluation:   Nutrition Goals Discharge (Final Nutrition Goals Re-Evaluation):   Psychosocial: Target Goals: Acknowledge presence or absence of significant depression and/or stress, maximize coping skills, provide positive support system. Participant is able to verbalize types and ability to use techniques and skills needed for reducing stress and depression.  Initial Review & Psychosocial Screening:  Initial Psych Review & Screening - 10/06/21 1536       Initial Review   Current issues with Current Stress Concerns;History of Depression    Source of Stress Concerns Family;Unable to perform  yard/household activities;Unable to participate in former interests or hobbies;Chronic Illness    Comments Carolyn Salazar is going through a seperation. Carolyn Salazar is on an antidepresant and is not experiencing any depression at this time      [place]? Yes   Carolyn Salazar has her friend and daughter for support     Barriers   Psychosocial barriers to participate in program The patient should benefit from training in stress management and relaxation.      Screening Interventions   Interventions Encouraged to exercise;Provide feedback about the scores to participant    Expected Outcomes Long Term Goal: Stressors or current issues are controlled or eliminated.;Short Term goal: Identification and review with participant of any Quality of Life or Depression concerns found by scoring the questionnaire.;Long Term goal: The participant improves quality of Life and PHQ9 Scores as seen by post scores and/or verbalization of changes             Quality of Life Scores:  Quality of Life - 10/06/21 1340       Quality of Life   Select Quality of Life      Quality of Life Scores   Health/Function Pre 19.93 %    Socioeconomic Pre 28.21 %    Psych/Spiritual Pre 27.5 %    Family Pre 30 %    GLOBAL Pre 24.68 %            Scores of 19 and below usually indicate a poorer quality of life in these areas.  A difference of  2-3 points is a clinically meaningful difference.  A difference of 2-3 points in the total score of the Quality of Life Index has been associated with significant improvement in overall quality of life, self-image, physical symptoms, and general health in studies assessing change in quality of life.  PHQ-9: Review Flowsheet       10/06/2021 11/29/2017 01/15/2014 08/12/2012  Depression screen PHQ 2/9  Decreased Interest 0 0 0 0  Down, Depressed, Hopeless 0 0 1 0  PHQ - 2 Score 0 0 1 0   Interpretation of Total Score  Total Score Depression Severity:  1-4 = Minimal  depression, 5-9 = Mild depression, 10-14 = Moderate depression, 15-19 = Moderately severe depression, 20-27 = Severe depression   Psychosocial Evaluation and Intervention:   Psychosocial Re-Evaluation:  Psychosocial Re-Evaluation     [place] Name 10/11/21 1311 10/24/21 1744 11/15/21 1107         Psychosocial Re-Evaluation   Current issues with Current Stress Concerns;Current Depression  Current Stress Concerns;Current Depression Current Stress Concerns;Current Depression     Comments Will review quality of life questionnaire with the patient this week Bevere[place] Risen of life questionnaire reviewed. Carolyn Salazar's depression is currently controlled by prescribed antidepressants. Carolyn Salazar's depression is currently controlled by prescribed antidepressants.Carolyn Salazar has not voiced any increased concerns or stressors     Expected Outcomes Carolyn Salazar will have decreased stress upon completion of phase 2 cardiac rehab Carolyn Salazar will have decreased stress upon completion of phase 2 cardiac rehab Carolyn Salazar will have decreased stress upon completion of phase 2 cardiac rehab     Interventions Stress management education;Relaxation education;Encouraged to attend Cardiac Rehabilitation for the exercise Stress management education;Relaxation education;Encouraged to attend Cardiac Rehabilitation for the exercise Stress management education;Relaxation education;Encouraged to attend Cardiac Rehabilitation for the exercise     Continue Psychosocial Services  Follow up required by staff Follow up required by staff Follow up required by staff       Initial Review   Source of Stress Concerns Family;Unable to participate in former interests or hobbies;Unable to perform yard/household activities;Chronic Illness Family;Unable to participate in former interests or hobbies;Unable to perform yard/household activities;Chronic Illness Family;Unable to participate in former interests or hobbies;Unable to perform yard/household activities;Chronic Illness     Comments Will  continue to montitor and offer support as needed Will continue to montitor and offer support as needed Will continue to montitor and offer support as needed              Psychosocial Discharge (Final Psychosocial Re-Evaluation):  Psychosocial Re-Evaluation - 11/15/21 1107       Psychosocial Re-Evaluation   Current issues with Current Stress Concerns;Current Depression    Comments Carolyn Salazar's depression is currently controlled by prescribed antidepressants.Carolyn Salazar has not voiced any increased concerns or stressors    Expected Outcomes Carolyn Salazar will have decreased stress upon completion of phase 2 cardiac rehab    Interventions Stress management education;Relaxation education;Encouraged to attend Cardiac Rehabilitation for the exercise    Continue Psychosocial Services  Follow up required by staff      Initial Review   Source of Stress Concerns Family;Unable to participate in former interests or hobbies;Unable to perform yard/household activities;Chronic Illness    Comments Will continue to montitor and offer support as needed             Vocational Rehabilitation: Provide vocational rehab assistance to qualifying candidates.   Vocational Rehab Evaluation & Intervention:  Vocational Rehab - 10/06/21 1539       Initial Vocational Rehab Evaluation & Intervention   Assessment shows need for Vocational Rehabilitation No   Carolyn Salazar is disabled and does not need vocational rehab at this time            Education: Education Goals: Education classes will be provided on a weekly basis, covering required topics. Participant will state understanding/return demonstration of topics presented.     Core Videos: Exercise    Move It!  Clinical staff conducted group or individual video education with verbal and written material and guidebook.  Patient learns the recommended Pritikin exercise program. Exercise with the goal of living a long, healthy life. Some of the health benefits of exercise include  controlled diabetes, healthier blood pressure levels, improved cholesterol levels, improved heart and lung capacity, improved sleep, and better body composition. Everyone should speak with their doctor before starting or changing an exercise routine.  Biomechanical Limitations Clinical staff conducted group or individual video education with verbal and written material and guidebook.  Patient learns how biomechanical  limitations can impact exercise and how we can mitigate and possibly overcome limitations to have an impactful and balanced exercise routine.  Body Composition Clinical staff conducted group or individual video education with verbal and written material and guidebook.  Patient learns that body composition (ratio of muscle mass to fat mass) is a key component to assessing overall fitness, rather than body weight alone. Increased fat mass, especially visceral belly fat, can put [place] at increased risk for metabolic syndrome, type 2 diabetes, heart disease, and even death. It is recommended to combine diet and exercise (cardiovascular and resistance training) to improve your body composition. Seek guidance from your physician and exercise physiologist before implementing an exercise routine.  Exercise Action Plan Clinical staff conducted group or individual video education with verbal and written material and guidebook.  Patient learns the recommended strategies to achieve and enjoy long-term exercise adherence, including variety, self-motivation, self-efficacy, and positive decision making. Benefits of exercise include fitness, good health, weight management, more energy, better sleep, less stress, and overall well-being.  Medical   Heart Disease Risk Reduction Clinical staff conducted group or individual video education with verbal and written material and guidebook.  Patient learns our heart is our most vital organ as it circulates oxygen, nutrients, white blood cells, and hormones  throughout the entire body, and carries waste away. Data supports a plant-based eating plan like the Pritikin Program for its effectiveness in slowing progression of and reversing heart disease. The video provides a number of recommendations to address heart disease.   Metabolic Syndrome and Belly Fat  Clinical staff conducted group or individual video education with verbal and written material and guidebook.  Patient learns what metabolic syndrome is, how it leads to heart disease, and how one can reverse it and keep it from coming back. You have metabolic syndrome if you have 3 of the following 5 criteria: abdominal obesity, high blood pressure, high triglycerides, low HDL cholesterol, and high blood sugar.  Hypertension and Heart Disease Clinical staff conducted group or individual video education with verbal and written material and guidebook.  Patient learns that high blood pressure, or hypertension, is very common in the [place]. Hypertension is largely due to excessive salt intake, but other important risk factors include being overweight, physical inactivity, drinking too much alcohol, smoking, and not eating enough potassium from fruits and vegetables. High blood pressure is a leading risk factor for heart attack, stroke, congestive heart failure, dementia, kidney failure, and premature death. Long-term effects of excessive salt intake include stiffening of the arteries and thickening of heart muscle and organ damage. Recommendations include ways to reduce hypertension and the risk of heart disease.  Diseases of Our Time - Focusing on Diabetes Clinical staff conducted group or individual video education with verbal and written material and guidebook.  Patient learns why the best way to stop diseases of our time is prevention, through food and other lifestyle changes. Medicine (such as prescription pills and surgeries) is often only a Band-Aid on the problem, not a long-term solution. Most  common diseases of our time include obesity, type 2 diabetes, hypertension, heart disease, and cancer. The Pritikin Program is recommended and has been proven to help reduce, reverse, and/or prevent the damaging effects of metabolic syndrome.  Nutrition   Overview of the Pritikin Eating Plan  Clinical staff conducted group or individual video education with verbal and written material and guidebook.  Patient learns about the [place] for disease risk reduction. The [place]  emphasizes a wide variety of unrefined, minimally-processed carbohydrates, like fruits, vegetables, whole grains, and legumes. Go, Caution, and Stop food choices are explained. Plant-based and lean animal proteins are emphasized. Rationale provided for low sodium intake for blood pressure control, low added sugars for blood sugar stabilization, and low added fats and oils for coronary artery disease risk reduction and weight management.  Calorie Density  Clinical staff conducted group or individual video education with verbal and written material and guidebook.  Patient learns about calorie density and how it impacts the Pritikin Eating Plan. Knowing the characteristics of the food you choose will help you decide whether those foods will lead to weight gain or weight loss, and whether you want to consume more or less of them. Weight loss is usually a side effect of the Pritikin Eating Plan because of its focus on low calorie-dense foods.  Label Reading  Clinical staff conducted group or individual video education with verbal and written material and guidebook.  Patient learns about the Pritikin recommended label reading guidelines and corresponding recommendations regarding calorie density, added sugars, sodium content, and whole grains.  Dining Out - Part 1  Clinical staff conducted group or individual video education with verbal and written material and guidebook.  Patient learns that restaurant meals  can be sabotaging because they can be so high in calories, fat, sodium, and/or sugar. Patient learns recommended strategies on how to positively address this and avoid unhealthy pitfalls.  Facts on Fats  Clinical staff conducted group or individual video education with verbal and written material and guidebook.  Patient learns that lifestyle modifications can be just as effective, if not more so, as many medications for lowering your risk of heart disease. A Pritikin lifestyle can help to reduce your risk of inflammation and atherosclerosis (cholesterol build-up, or plaque, in the artery walls). Lifestyle interventions such as dietary choices and physical activity address the cause of atherosclerosis. A review of the types of fats and their impact on blood cholesterol levels, along with dietary recommendations to reduce fat intake is also included.  Nutrition Action Plan  Clinical staff conducted group or individual video education with verbal and written material and guidebook.  Patient learns how to incorporate Pritikin recommendations into their lifestyle. Recommendations include planning and keeping personal health goals in mind as an important part of their success.  Healthy Mind-Set    Healthy Minds, Bodies, Hearts  Clinical staff conducted group or individual video education with verbal and written material and guidebook.  Patient learns how to identify when they are stressed. Video will discuss the impact of that stress, as well as the many benefits of stress management. Patient will also be introduced to stress management techniques. The way we think, act, and feel has an impact on our hearts.  How Our Thoughts Can Heal Our Hearts  Clinical staff conducted group or individual video education with verbal and written material and guidebook.  Patient learns that negative thoughts can cause depression and anxiety. This can result in negative lifestyle behavior and serious health problems.  Cognitive behavioral therapy is an effective method to help control our thoughts in order to change and improve our emotional outlook.  Additional Videos:  Exercise    Improving Performance  Clinical staff conducted group or individual video education with verbal and written material and guidebook.  Patient learns to use a non-linear approach by alternating intensity levels and lengths of time spent exercising to help burn more calories and lose more body fat. Cardiovascular  exercise helps improve heart health, metabolism, hormonal balance, blood sugar control, and recovery from fatigue. Resistance training improves strength, endurance, balance, coordination, reaction time, metabolism, and muscle mass. Flexibility exercise improves circulation, posture, and balance. Seek guidance from your physician and exercise physiologist before implementing an exercise routine and learn your capabilities and proper form for all exercise.  Introduction to Yoga  Clinical staff conducted group or individual video education with verbal and written material and guidebook.  Patient learns about yoga, a discipline of the coming together of mind, breath, and body. The benefits of yoga include improved flexibility, improved range of motion, better posture and core strength, increased lung function, weight loss, and positive self-image. Yoga's heart health benefits include lowered blood pressure, healthier heart rate, decreased cholesterol and triglyceride levels, improved immune function, and reduced stress. Seek guidance from your physician and exercise physiologist before implementing an exercise routine and learn your capabilities and proper form for all exercise.  Medical   Aging: Enhancing Your Quality of Life  Clinical staff conducted group or individual video education with verbal and written material and guidebook.  Patient learns key strategies and recommendations to stay in good physical health and enhance  quality of life, such as prevention strategies, having an advocate, securing a [place], and keeping a list of medications and system for tracking them. It also discusses how to avoid risk for bone loss.  Biology of Weight Control  Clinical staff conducted group or individual video education with verbal and written material and guidebook.  Patient learns that weight gain occurs because we consume more calories than we burn (eating more, moving less). Even if your body weight is normal, you may have higher ratios of fat compared to muscle mass. Too much body fat puts you at increased risk for cardiovascular disease, heart attack, stroke, type 2 diabetes, and obesity-related cancers. In addition to exercise, following the [place] can help reduce your risk.  Decoding Lab Results  Clinical staff conducted group or individual video education with verbal and written material and guidebook.  Patient learns that lab test reflects one measurement whose values change over time and are influenced by many factors, including medication, stress, sleep, exercise, food, hydration, pre-existing medical conditions, and more. It is recommended to use the knowledge from this video to become more involved with your lab results and evaluate your numbers to speak with your doctor.   Diseases of Our Time - Overview  Clinical staff conducted group or individual video education with verbal and written material and guidebook.  Patient learns that according to the CDC, 50% to 70% of chronic diseases (such as obesity, type 2 diabetes, elevated lipids, hypertension, and heart disease) are avoidable through lifestyle improvements including healthier food choices, listening to satiety cues, and increased physical activity.  Sleep Disorders Clinical staff conducted group or individual video education with verbal and written material and guidebook.  Patient learns how good quality and  duration of sleep are important to overall health and well-being. Patient also learns about sleep disorders and how they impact health along with recommendations to address them, including discussing with a physician.  Nutrition  Dining Out - Part 2 Clinical staff conducted group or individual video education with verbal and written material and guidebook.  Patient learns how to plan ahead and communicate in order to maximize their dining experience in a healthy and nutritious manner. Included are recommended food choices based on the type of restaurant the patient  is visiting.   Fueling a Best boy conducted group or individual video education with verbal and written material and guidebook.  There is a strong connection between our food choices and our health. Diseases like obesity and type 2 diabetes are very prevalent and are in large-part due to lifestyle choices. The Pritikin Eating Plan provides plenty of food and hunger-curbing satisfaction. It is easy to follow, affordable, and helps reduce health risks.  Menu Workshop  Clinical staff conducted group or individual video education with verbal and written material and guidebook.  Patient learns that restaurant meals can sabotage health goals because they are often packed with calories, fat, sodium, and sugar. Recommendations include strategies to plan ahead and to communicate with the manager, chef, or server to help order a healthier meal.  Planning Your Eating Strategy  Clinical staff conducted group or individual video education with verbal and written material and guidebook.  Patient learns about the [place] and its benefit of reducing the risk of disease. The [place] does not focus on calories. Instead, it emphasizes high-quality, nutrient-rich foods. By knowing the characteristics of the foods, we choose, we can determine their calorie density and make informed decisions.  Targeting Your  Nutrition Priorities  Clinical staff conducted group or individual video education with verbal and written material and guidebook.  Patient learns that lifestyle habits have a tremendous impact on disease risk and progression. This video provides eating and physical activity recommendations based on your personal health goals, such as reducing LDL cholesterol, losing weight, preventing or controlling type 2 diabetes, and reducing high blood pressure.  Vitamins and Minerals  Clinical staff conducted group or individual video education with verbal and written material and guidebook.  Patient learns different ways to obtain key vitamins and minerals, including through a recommended healthy diet. It is important to discuss all supplements you take with your doctor.   Healthy Mind-Set    Smoking Cessation  Clinical staff conducted group or individual video education with verbal and written material and guidebook.  Patient learns that cigarette smoking and tobacco addiction pose a serious health risk which affects millions of people. Stopping smoking will significantly reduce the risk of heart disease, lung disease, and many forms of cancer. Recommended strategies for quitting are covered, including working with your doctor to develop a successful plan.  Culinary   Becoming a Financial trader conducted group or individual video education with verbal and written material and guidebook.  Patient learns that cooking at home can be healthy, cost-effective, quick, and puts them in control. Keys to cooking healthy recipes will include looking at your recipe, assessing your equipment needs, planning ahead, making it simple, choosing cost-effective seasonal ingredients, and limiting the use of added fats, salts, and sugars.  Cooking - Breakfast and Snacks  Clinical staff conducted group or individual video education with verbal and written material and guidebook.  Patient learns how important  breakfast is to satiety and nutrition through the entire day. Recommendations include key foods to eat during breakfast to help stabilize blood sugar levels and to prevent overeating at meals later in the day. Planning ahead is also a key component.  Cooking - Human resources officer conducted group or individual video education with verbal and written material and guidebook.  Patient learns eating strategies to improve overall health, including an approach to cook more at home. Recommendations include thinking of animal protein as a side on your plate  rather than center stage and focusing instead on lower calorie dense options like vegetables, fruits, whole grains, and plant-based proteins, such as beans. Making sauces in large quantities to freeze for later and leaving the skin on your vegetables are also recommended to maximize your experience.  Cooking - Healthy Salads and Dressing Clinical staff conducted group or individual video education with verbal and written material and guidebook.  Patient learns that vegetables, fruits, whole grains, and legumes are the foundations of the [place]. Recommendations include how to incorporate each of these in flavorful and healthy salads, and how to create homemade salad dressings. Proper handling of ingredients is also covered. Cooking - Soups and [employer] - Soups and Desserts Clinical staff conducted group or individual video education with verbal and written material and guidebook.  Patient learns that Pritikin soups and desserts make for easy, nutritious, and delicious snacks and meal components that are low in sodium, fat, sugar, and calorie density, while high in vitamins, minerals, and filling fiber. Recommendations include simple and healthy ideas for soups and desserts.   Overview     The Pritikin Solution Program Overview Clinical staff conducted group or individual video education with verbal and written material  and guidebook.  Patient learns that the results of the [place] Program have been documented in more than 100 articles published in peer-reviewed journals, and the benefits include reducing risk factors for (and, in some cases, even reversing) high cholesterol, high blood pressure, type 2 diabetes, obesity, and more! An overview of the three key pillars of the Pritikin Program will be covered: eating well, doing regular exercise, and having a healthy mind-set.  WORKSHOPS  Exercise: Exercise Basics: Building Your Action Plan Clinical staff led group instruction and group discussion with PowerPoint presentation and patient guidebook. To enhance the learning environment the use of posters, models and videos may be added. At the conclusion of this workshop, patients will comprehend the difference between physical activity and exercise, as well as the benefits of incorporating both, into their routine. Patients will understand the FITT (Frequency, Intensity, Time, and Type) principle and how to use it to build an exercise action plan. In addition, safety concerns and other considerations for exercise and cardiac rehab will be addressed by the presenter. The purpose of this lesson is to promote a comprehensive and effective weekly exercise routine in order to improve patients' overall level of fitness.   Managing Heart Disease: Your Path to a Healthier Heart Clinical staff led group instruction and group discussion with PowerPoint presentation and patient guidebook. To enhance the learning environment the use of posters, models and videos may be added.At the conclusion of this workshop, patients will understand the anatomy and physiology of the heart. Additionally, they will understand how Pritikin's three pillars impact the risk factors, the progression, and the management of heart disease.  The purpose of this lesson is to provide a high-level overview of the heart, heart disease, and how the Pritikin  lifestyle positively impacts risk factors.  Exercise Biomechanics Clinical staff led group instruction and group discussion with PowerPoint presentation and patient guidebook. To enhance the learning environment the use of posters, models and videos may be added. Patients will learn how the structural parts of their bodies function and how these functions impact their daily activities, movement, and exercise. Patients will learn how to promote a neutral spine, learn how to manage pain, and identify ways to improve their physical movement in order to promote healthy living. The purpose  of this lesson is to expose patients to common physical limitations that impact physical activity. Participants will learn practical ways to adapt and manage aches and pains, and to minimize their effect on regular exercise. Patients will learn how to maintain good posture while sitting, walking, and lifting.  Balance Training and Fall Prevention  Clinical staff led group instruction and group discussion with PowerPoint presentation and patient guidebook. To enhance the learning environment the use of posters, models and videos may be added. At the conclusion of this workshop, patients will understand the importance of their sensorimotor skills (vision, proprioception, and the vestibular system) in maintaining their ability to balance as they age. Patients will apply a variety of balancing exercises that are appropriate for their current level of function. Patients will understand the common causes for poor balance, possible solutions to these problems, and ways to modify their physical environment in order to minimize their fall risk. The purpose of this lesson is to teach patients about the importance of maintaining balance as they age and ways to minimize their risk of falling.  WORKSHOPS   Nutrition:  Fueling a Scientist, research (physical sciences) led group instruction and group discussion with PowerPoint presentation  and patient guidebook. To enhance the learning environment the use of posters, models and videos may be added. Patients will review the foundational principles of the [place] and understand what constitutes a serving size in each of the food groups. Patients will also learn Pritikin-friendly foods that are better choices when away from home and review make-ahead meal and snack options. Calorie density will be reviewed and applied to three nutrition priorities: weight maintenance, weight loss, and weight gain. The purpose of this lesson is to reinforce (in a group setting) the key concepts around what patients are recommended to eat and how to apply these guidelines when away from home by planning and selecting Pritikin-friendly options. Patients will understand how calorie density may be adjusted for different weight management goals.  Mindful Eating  Clinical staff led group instruction and group discussion with PowerPoint presentation and patient guidebook. To enhance the learning environment the use of posters, models and videos may be added. Patients will briefly review the concepts of the [place] and the importance of low-calorie dense foods. The concept of mindful eating will be introduced as well as the importance of paying attention to internal hunger signals. Triggers for non-hunger eating and techniques for dealing with triggers will be explored. The purpose of this lesson is to provide patients with the opportunity to review the basic principles of the [place], discuss the value of eating mindfully and how to measure internal cues of hunger and fullness using the Hunger Scale. Patients will also discuss reasons for non-hunger eating and learn strategies to use for controlling emotional eating.  Targeting Your Nutrition Priorities Clinical staff led group instruction and group discussion with PowerPoint presentation and patient guidebook. To enhance the  learning environment the use of posters, models and videos may be added. Patients will learn how to determine their genetic susceptibility to disease by reviewing their family history. Patients will gain insight into the importance of diet as part of an overall healthy lifestyle in mitigating the impact of genetics and other environmental insults. The purpose of this lesson is to provide patients with the opportunity to assess their personal nutrition priorities by looking at their family history, their own health history and current risk factors. Patients will also be able to discuss ways  of prioritizing and modifying the [place] for their highest risk areas  Menu  Clinical staff led group instruction and group discussion with PowerPoint presentation and patient guidebook. To enhance the learning environment the use of posters, models and videos may be added. Using menus brought in from [employer], or printed from [employer], patients will apply the [place] dining out guidelines that were presented in the [employer] video. Patients will also be able to practice these guidelines in a variety of provided scenarios. The purpose of this lesson is to provide patients with the opportunity to practice hands-on learning of the [place] with actual menus and practice scenarios.  Label Reading Clinical staff led group instruction and group discussion with PowerPoint presentation and patient guidebook. To enhance the learning environment the use of posters, models and videos may be added. Patients will review and discuss the [employer] label reading guidelines presented in [employer]'s Label Reading Educational series video. Using fool labels brought in from local grocery stores and markets, patients will apply the label reading guidelines and determine if the packaged food meet the [employer] guidelines. The purpose of this lesson is to provide patients with  the opportunity to review, discuss, and practice hands-on learning of the [employer] Label Reading guidelines with actual packaged food labels. [place] Workshops are designed to teach patients ways to prepare quick, simple, and affordable recipes at home. The importance of nutrition's role in chronic disease risk reduction is reflected in its emphasis in the overall [employer] program. By learning how to prepare essential core [employer] Eating Plan recipes, patients will increase control over what they eat; be able to customize the flavor of foods without the use of added salt, sugar, or fat; and improve the quality of the food they consume. By learning a set of core recipes which are easily assembled, quickly prepared, and affordable, patients are more likely to prepare more healthy foods at home. These workshops focus on convenient breakfasts, simple entres, side dishes, and desserts which can be prepared with minimal effort and are consistent with nutrition recommendations for cardiovascular risk reduction. Cooking [employer] are taught by a Engineer, materials (RD) who has been trained by the [employer]. The chef or RD has a clear understanding of the importance of minimizing - if not completely eliminating - added fat, sugar, and sodium in recipes. Throughout the series of [place] Workshop sessions, patients will learn about healthy ingredients and efficient methods of cooking to build confidence in their capability to prepare    Cooking School weekly topics:  Adding Flavor- Sodium-Free  Fast and Healthy Breakfasts  Powerhouse Plant-Based Proteins  Satisfying Salads and Dressings  Simple Sides and Sauces  International Cuisine-Spotlight on the [place] Zones  Delicious Desserts  Savory Soups  Efficiency Cooking - Meals in a Snap  Tasty Appetizers and Snacks  Comforting Weekend Breakfasts  One-Pot Wonders   Fast Evening  Meals  Easy [place] (Psychosocial): New Thoughts, New Behaviors Clinical staff led group instruction and group discussion with PowerPoint presentation and patient guidebook. To enhance the learning environment the use of posters, models and videos may be added. Patients will learn and practice techniques for developing effective health and lifestyle goals. Patients will be able to effectively apply the goal setting process learned to develop at least one new personal goal.  The purpose of this lesson  is to expose patients to a new skill set of behavior modification techniques such as techniques setting SMART goals, overcoming barriers, and achieving new thoughts and new behaviors.  Managing Moods and Relationships Clinical staff led group instruction and group discussion with PowerPoint presentation and patient guidebook. To enhance the learning environment the use of posters, models and videos may be added. Patients will learn how emotional and chronic stress factors can impact their health and relationships. They will learn healthy ways to manage their moods and utilize positive coping mechanisms. In addition, ICR patients will learn ways to improve communication skills. The purpose of this lesson is to expose patients to ways of understanding how one's mood and health are intimately connected. Developing a healthy outlook can help build positive relationships and connections with others. Patients will understand the importance of utilizing effective communication skills that include actively listening and being heard. They will learn and understand the importance of the "4 Cs" and especially Connections in fostering of a Healthy Mind-Set.  Healthy Sleep for a Healthy Heart Clinical staff led group instruction and group discussion with PowerPoint presentation and patient guidebook. To enhance the learning environment the use of  posters, models and videos may be added. At the conclusion of this workshop, patients will be able to demonstrate knowledge of the importance of sleep to overall health, well-being, and quality of life. They will understand the symptoms of, and treatments for, common sleep disorders. Patients will also be able to identify daytime and nighttime behaviors which impact sleep, and they will be able to apply these tools to help manage sleep-related challenges. The purpose of this lesson is to provide patients with a general overview of sleep and outline the importance of quality sleep. Patients will learn about a few of the most common sleep disorders. Patients will also be introduced to the concept of "sleep hygiene," and discover ways to self-manage certain sleeping problems through simple daily behavior changes. Finally, the workshop will motivate patients by clarifying the links between quality sleep and their goals of heart-healthy living.   Recognizing and Reducing Stress Clinical staff led group instruction and group discussion with PowerPoint presentation and patient guidebook. To enhance the learning environment the use of posters, models and videos may be added. At the conclusion of this workshop, patients will be able to understand the types of stress reactions, differentiate between acute and chronic stress, and recognize the impact that chronic stress has on their health. They will also be able to apply different coping mechanisms, such as reframing negative self-talk. Patients will have the opportunity to practice a variety of stress management techniques, such as deep abdominal breathing, progressive muscle relaxation, and/or guided imagery.  The purpose of this lesson is to educate patients on the role of stress in their lives and to provide healthy techniques for coping with it.  Learning Barriers/Preferences:  Learning Barriers/Preferences - 10/06/21 1341       Learning Barriers/Preferences    Learning Barriers Sight   wears glasses   Learning Preferences Computer/Internet;Written Material             Education Topics:  Knowledge Questionnaire Score:  Knowledge Questionnaire Score - 10/06/21 1341       Knowledge Questionnaire Score   Pre Score 20/24             Core Components/Risk Factors/Patient Goals at Admission:  Personal Goals and Risk Factors at Admission - 10/06/21 1346       Core Components/Risk Factors/Patient Goals on  Admission    Weight Management Yes;Obesity;Weight Loss    Intervention Weight Management: Develop a combined nutrition and exercise program designed to reach desired caloric intake, while maintaining appropriate intake of nutrient and fiber, sodium and fats, and appropriate energy expenditure required for the weight goal.;Weight Management: Provide education and appropriate resources to help participant work on and attain dietary goals.;Weight Management/Obesity: Establish reasonable short term and long term weight goals.;Obesity: Provide education and appropriate resources to help participant work on and attain dietary goals.    Admit Weight 246 lb 14.6 oz (112 kg)    Expected Outcomes Short Term: Continue to assess and modify interventions until short term weight is achieved;Long Term: Adherence to nutrition and physical activity/exercise program aimed toward attainment of established weight goal;Weight Maintenance: Understanding of the daily nutrition guidelines, which includes 25-35% calories from fat, 7% or less cal from saturated fats, less than 229m cholesterol, less than 1.5gm of sodium, & 5 or more servings of fruits and vegetables daily;Weight Loss: Understanding of general recommendations for a balanced deficit meal plan, which promotes 1-2 lb weight loss per week and includes a negative energy balance of [phone] kcal/d;Understanding recommendations for meals to include 15-35% energy as protein, 25-35% energy from fat, 35-60% energy  from carbohydrates, less than 2046mof dietary cholesterol, 20-35 gm of total fiber daily;Understanding of distribution of calorie intake throughout the day with the consumption of 4-5 meals/snacks    Improve shortness of breath with ADL's Yes    Intervention Provide education, individualized exercise plan and daily activity instruction to help decrease symptoms of SOB with activities of daily living.    Expected Outcomes Short Term: Improve cardiorespiratory fitness to achieve a reduction of symptoms when performing ADLs;Long Term: Be able to perform more ADLs without symptoms or delay the onset of symptoms    Diabetes Yes    Intervention Provide education about signs/symptoms and action to take for hypo/hyperglycemia.;Provide education about proper nutrition, including hydration, and aerobic/resistive exercise prescription along with prescribed medications to achieve blood glucose in normal ranges: Fasting glucose 65-99 mg/dL    Expected Outcomes Short Term: Participant verbalizes understanding of the signs/symptoms and immediate care of hyper/hypoglycemia, proper foot care and importance of medication, aerobic/resistive exercise and nutrition plan for blood glucose control.;Long Term: Attainment of HbA1C < 7%.    Heart Failure Yes    Intervention Provide a combined exercise and nutrition program that is supplemented with education, support and counseling about heart failure. Directed toward relieving symptoms such as shortness of breath, decreased exercise tolerance, and extremity edema.    Expected Outcomes Improve functional capacity of life;Short term: Attendance in program 2-3 days a week with increased exercise capacity. Reported lower sodium intake. Reported increased fruit and vegetable intake. Reports medication compliance.;Short term: Daily weights obtained and reported for increase. Utilizing diuretic protocols set by physician.;Long term: Adoption of self-care skills and reduction of barriers  for early signs and symptoms recognition and intervention leading to self-care maintenance.    Hypertension Yes    Intervention Provide education on lifestyle modifcations including regular physical activity/exercise, weight management, moderate sodium restriction and increased consumption of fresh fruit, vegetables, and low fat dairy, alcohol moderation, and smoking cessation.;Monitor prescription use compliance.    Expected Outcomes Short Term: Continued assessment and intervention until BP is < 140/9043mG in hypertensive participants. < 130/67m58m in hypertensive participants with diabetes, heart failure or chronic kidney disease.;Long Term: Maintenance of blood pressure at goal levels.    Lipids Yes  Intervention Provide education and support for participant on nutrition & aerobic/resistive exercise along with prescribed medications to achieve LDL <6m, HDL >44m    Expected Outcomes Short Term: Participant states understanding of desired cholesterol values and is compliant with medications prescribed. Participant is following exercise prescription and nutrition guidelines.;Long Term: Cholesterol controlled with medications as prescribed, with individualized exercise RX and with personalized nutrition plan. Value goals: LDL < 706mHDL > 40 mg.    Stress Yes    Intervention Offer individual and/or small group education and counseling on adjustment to heart disease, stress management and health-related lifestyle change. Teach and support self-help strategies.;Refer participants experiencing significant psychosocial distress to appropriate mental health specialists for further evaluation and treatment. When possible, include family members and significant others in education/counseling sessions.    Expected Outcomes Short Term: Participant demonstrates changes in health-related behavior, relaxation and other stress management skills, ability to obtain effective social support, and compliance with  psychotropic medications if prescribed.;Long Term: Emotional wellbeing is indicated by absence of clinically significant psychosocial distress or social isolation.             Core Components/Risk Factors/Patient Goals Review:   Goals and Risk Factor Review     Row Name 10/11/21 1315 10/24/21 1746 11/15/21 1111         Core Components/Risk Factors/Patient Goals Review   Personal Goals Review Weight Management/Obesity;Heart Failure;Stress;Hypertension;Improve shortness of breath with ADL's;Diabetes;Lipids Weight Management/Obesity;Heart Failure;Stress;Hypertension;Improve shortness of breath with ADL's;Diabetes;Lipids Weight Management/Obesity;Heart Failure;Stress;Hypertension;Improve shortness of breath with ADL's;Diabetes;Lipids     Review JenDelsa Salearted cardiac rehab on 10/10/21. JenDelsa Saled well with exercise for her fitness level. JenDelsa Salazar deconditoined. Vital signs and CBG's were stable Carolyn Salazar started cardiac rehab on 10/10/21. JenDelsa Salazar off to a good start to exercise. Vital signs and CBG's have been stable. JenDelsa Saleports that she does not feel as short of breath when she goes up and down the stairs since starting cardiac rehab. Carolyn Salazar's met levels have slowly increased Vital signs and CBG's have been stable. Carolyn Salazar's weights have been variable.     Expected Outcomes JenDelsa Salell continue to participate in phase 2 cardiac rehab for exercise, nutrition and lifestyle modifications JenDelsa Salell continue to participate in phase 2 cardiac rehab for exercise, nutrition and lifestyle modifications JenDelsa Salell continue to participate in phase 2 cardiac rehab for exercise, nutrition and lifestyle modifications              Core Components/Risk Factors/Patient Goals at Discharge (Final Review):   Goals and Risk Factor Review - 11/15/21 1111       Core Components/Risk Factors/Patient Goals Review   Personal Goals Review Weight Management/Obesity;Heart Failure;Stress;Hypertension;Improve shortness of breath with  ADL's;Diabetes;Lipids    Review Carolyn Salazar's met levels have slowly increased Vital signs and CBG's have been stable. Carolyn Salazar's weights have been variable.    Expected Outcomes JenDelsa Salell continue to participate in phase 2 cardiac rehab for exercise, nutrition and lifestyle modifications             ITP Comments:  ITP Comments     Row Name 10/06/21 1535 10/11/21 1307 10/24/21 1743 11/15/21 1106     ITP Comments Dr TraFransico Him, Medical Director 30 Day ITP Review. JenDelsa Salearted cardiac rehab on 10/10/21. JenDelsa Saled well with exercise for her fitness level 30 Day ITP Review. JenDelsa Salazar off to a good start to exercise at cardiac rehab 30 Day ITP Review. JenDelsa Sales good attendance and participation in phase 2 cardiac rehab.  Comments: See ITP Comments

## 2021-11-16 NOTE — Telephone Encounter (Signed)
Returned call to [facility] at cardiac rehab left message Dr.Tobb is out of office this week.

## 2021-11-17 NOTE — Telephone Encounter (Signed)
Fax received, placed in Dr. [name] folder for her to review when she returns.

## 2021-11-18 ENCOUNTER — Encounter (HOSPITAL_COMMUNITY)
Admission: RE | Admit: 2021-11-18 | Discharge: 2021-11-18 | Disposition: A | Discharge status: Home / Self Care | Payer: Managed Care, Other (non HMO) | Source: Ambulatory Visit | Attending: Cardiology | Admitting: Cardiology

## 2021-11-18 ENCOUNTER — Encounter (HOSPITAL_COMMUNITY): Payer: Managed Care, Other (non HMO)

## 2021-11-18 DIAGNOSIS — I5022 Chronic systolic (congestive) heart failure: Secondary | ICD-10-CM

## 2021-11-21 ENCOUNTER — Encounter (HOSPITAL_COMMUNITY): Payer: Managed Care, Other (non HMO)

## 2021-11-21 ENCOUNTER — Encounter (HOSPITAL_COMMUNITY)
Admission: RE | Admit: 2021-11-21 | Discharge: 2021-11-21 | Disposition: A | Discharge status: Home / Self Care | Payer: Managed Care, Other (non HMO) | Source: Ambulatory Visit | Attending: Cardiology | Admitting: Cardiology

## 2021-11-21 DIAGNOSIS — I5022 Chronic systolic (congestive) heart failure: Secondary | ICD-10-CM | POA: Insufficient documentation

## 2021-11-23 ENCOUNTER — Encounter (HOSPITAL_COMMUNITY)
Admission: RE | Admit: 2021-11-23 | Discharge: 2021-11-23 | Disposition: A | Discharge status: Home / Self Care | Payer: Managed Care, Other (non HMO) | Source: Ambulatory Visit | Attending: Cardiology | Admitting: Cardiology

## 2021-11-23 ENCOUNTER — Encounter (HOSPITAL_COMMUNITY): Payer: Managed Care, Other (non HMO)

## 2021-11-23 DIAGNOSIS — I5022 Chronic systolic (congestive) heart failure: Secondary | ICD-10-CM

## 2021-11-25 ENCOUNTER — Encounter (HOSPITAL_COMMUNITY): Payer: Managed Care, Other (non HMO)

## 2021-11-25 ENCOUNTER — Encounter (HOSPITAL_COMMUNITY)
Admission: RE | Admit: 2021-11-25 | Discharge: 2021-11-25 | Disposition: A | Discharge status: Home / Self Care | Payer: Managed Care, Other (non HMO) | Source: Ambulatory Visit | Attending: Cardiology | Admitting: Cardiology

## 2021-11-25 VITALS — Ht 66.25 in | Wt 251.3 lb

## 2021-11-25 DIAGNOSIS — I5022 Chronic systolic (congestive) heart failure: Secondary | ICD-10-CM

## 2021-11-28 ENCOUNTER — Encounter (HOSPITAL_COMMUNITY)
Admission: RE | Admit: 2021-11-28 | Discharge: 2021-11-28 | Disposition: A | Discharge status: Home / Self Care | Payer: Managed Care, Other (non HMO) | Source: Ambulatory Visit | Attending: Cardiology

## 2021-11-28 ENCOUNTER — Encounter (HOSPITAL_COMMUNITY): Payer: Managed Care, Other (non HMO)

## 2021-11-28 DIAGNOSIS — I5022 Chronic systolic (congestive) heart failure: Secondary | ICD-10-CM | POA: Diagnosis not present

## 2021-11-30 ENCOUNTER — Encounter (HOSPITAL_COMMUNITY)
Admission: RE | Admit: 2021-11-30 | Discharge: 2021-11-30 | Disposition: A | Discharge status: Home / Self Care | Payer: Managed Care, Other (non HMO) | Source: Ambulatory Visit | Attending: Cardiology | Admitting: Cardiology

## 2021-11-30 ENCOUNTER — Encounter (HOSPITAL_COMMUNITY): Payer: Managed Care, Other (non HMO)

## 2021-11-30 DIAGNOSIS — I5022 Chronic systolic (congestive) heart failure: Secondary | ICD-10-CM

## 2021-11-30 NOTE — Progress Notes (Signed)
Discharge Progress Report  Patient Details  Name: Carolyn Salazar MRN: [id number] Date of Birth: Feb 12, 1971 Referring Provider:   Flowsheet Row CARDIAC REHAB PHASE II ORIENTATION from 10/06/2021 in [place]  Referring Provider Berniece Salines, MD        Number of Visits: 23  Reason for Discharge:  Patient reached a stable level of exercise. Patient independent in their exercise. Patient has met program and personal goals.  Smoking History:  Social History   Tobacco Use  Smoking Status Never  Smokeless Tobacco Never    Diagnosis:  Heart failure, chronic systolic (HCC)  ADL [facility]:   Initial Exercise Prescription:  Initial Exercise Prescription - 10/06/21 1300       Date of Initial Exercise RX and Referring Provider   Date 10/06/21    Referring Provider Berniece Salines, MD      NuStep   Level 1    SPM 75    Minutes 25    METs 2.4      Prescription Details   Frequency (times per week) 3    Duration Progress to 10 minutes continuous walking  at current work load and total walking time to 30-45 min      Intensity   Ratings of Perceived Exertion 11-13    Perceived Dyspnea 0-4      Progression   Progression Continue progressive overload as per policy without signs/symptoms or physical distress.      Resistance Training   Training Prescription Yes    Weight 2 lbs    Reps 10-15             Discharge Exercise Prescription (Final Exercise Prescription Changes):  Exercise Prescription Changes - 12/02/21 1035       Response to Exercise   Blood Pressure (Admit) 104/70    Blood Pressure (Exercise) 110/64    Blood Pressure (Exit) 110/70    Heart Rate (Admit) 100 bpm    Heart Rate (Exercise) 114 bpm    Heart Rate (Exit) 99 bpm    Rating of Perceived Exertion (Exercise) 12    Symptoms None    Comments Last session of cardiac rehab.    Duration Continue with 30 min of aerobic exercise without signs/symptoms of physical distress.     Intensity THRR unchanged      Progression   Progression Continue to progress workloads to maintain intensity without signs/symptoms of physical distress.    Average METs 2      Resistance Training   Training Prescription Yes    Weight 2 lbs    Reps 10-15    Time 10 Minutes      Interval Training   Interval Training No      NuStep   Level 4    SPM 40    Minutes 30    METs 2      Home Exercise Plan   Plans to continue exercise at Home (comment)   Walking   Frequency Add 2 additional days to program exercise sessions.    Initial Home Exercises Provided 10/28/21             Functional Capacity:  6 Minute Walk     Row Name 10/06/21 1125 11/25/21 1326       6 Minute Walk   Phase Initial Discharge    Distance 800 feet 1111 feet    Distance % Change -- 38.88 %    Distance Feet Change -- 311 ft    Walk Time 6  minutes 6 minutes    # of Rest Breaks 1  5:43-6:00 minutes 0    MPH -- 2.1    METS 2.4 2.85    RPE 12 13    Perceived Dyspnea  2 2    VO2 Peak 8.26 9.99    Symptoms Yes (comment) No    Comments SOB, RPD = 2 some SOB, RPD = 2    Resting HR 97 bpm 93 bpm    Resting BP 106/70 94/72    Resting Oxygen Saturation  98 % 98 %    Exercise Oxygen Saturation  during 6 min walk 98 % 96 %    Max Ex. HR 111 bpm 120 bpm    Max Ex. BP 110/80 110/72    2 Minute Post BP 100/70 101/68             Psychological, QOL, Others - Outcomes: PHQ 2/9:    11/30/2021   12:04 PM 10/06/2021   11:45 AM 11/29/2017   11:26 AM 01/15/2014    5:31 PM 08/12/2012    8:57 AM  Depression screen PHQ 2/9  Decreased Interest 0 0 0 0 0  Down, Depressed, Hopeless 0 0 0 1 0  PHQ - 2 Score 0 0 0 1 0    Quality of Life:  Quality of Life - 12/05/21 1339       Quality of Life   Select Quality of Life      Quality of Life Scores   Health/Function Pre 19.93 %    Health/Function Post 25.5 %    Health/Function % Change 27.95 %    Socioeconomic Pre 28.21 %    Socioeconomic Post 24.86 %     Socioeconomic % Change  -11.88 %    Psych/Spiritual Pre 27.5 %    Psych/Spiritual Post 24 %    Psych/Spiritual % Change -12.73 %    Family Pre 30 %    Family Post 23.7 %    Family % Change -21 %    GLOBAL Pre 24.68 %    GLOBAL Post 24.79 %    GLOBAL % Change 0.45 %             Personal Goals: Goals established at orientation with interventions provided to work toward goal.  Personal Goals and Risk Factors at Admission - 10/06/21 1346       Core Components/Risk Factors/Patient Goals on Admission    Weight Management Yes;Obesity;Weight Loss    Intervention Weight Management: Develop a combined nutrition and exercise program designed to reach desired caloric intake, while maintaining appropriate intake of nutrient and fiber, sodium and fats, and appropriate energy expenditure required for the weight goal.;Weight Management: Provide education and appropriate resources to help participant work on and attain dietary goals.;Weight Management/Obesity: Establish reasonable short term and long term weight goals.;Obesity: Provide education and appropriate resources to help participant work on and attain dietary goals.    Admit Weight 246 lb 14.6 oz (112 kg)    Expected Outcomes Short Term: Continue to assess and modify interventions until short term weight is achieved;Long Term: Adherence to nutrition and physical activity/exercise program aimed toward attainment of established weight goal;Weight Maintenance: Understanding of the daily nutrition guidelines, which includes 25-35% calories from fat, 7% or less cal from saturated fats, less than 283m cholesterol, less than 1.5gm of sodium, & 5 or more servings of fruits and vegetables daily;Weight Loss: Understanding of general recommendations for a balanced deficit meal plan, which promotes 1-2 lb weight loss  per week and includes a negative energy balance of [phone] kcal/d;Understanding recommendations for meals to include 15-35% energy as  protein, 25-35% energy from fat, 35-60% energy from carbohydrates, less than 245m of dietary cholesterol, 20-35 gm of total fiber daily;Understanding of distribution of calorie intake throughout the day with the consumption of 4-5 meals/snacks    Improve shortness of breath with ADL's Yes    Intervention Provide education, individualized exercise plan and daily activity instruction to help decrease symptoms of SOB with activities of daily living.    Expected Outcomes Short Term: Improve cardiorespiratory fitness to achieve a reduction of symptoms when performing ADLs;Long Term: Be able to perform more ADLs without symptoms or delay the onset of symptoms    Diabetes Yes    Intervention Provide education about signs/symptoms and action to take for hypo/hyperglycemia.;Provide education about proper nutrition, including hydration, and aerobic/resistive exercise prescription along with prescribed medications to achieve blood glucose in normal ranges: Fasting glucose 65-99 mg/dL    Expected Outcomes Short Term: Participant verbalizes understanding of the signs/symptoms and immediate care of hyper/hypoglycemia, proper foot care and importance of medication, aerobic/resistive exercise and nutrition plan for blood glucose control.;Long Term: Attainment of HbA1C < 7%.    Heart Failure Yes    Intervention Provide a combined exercise and nutrition program that is supplemented with education, support and counseling about heart failure. Directed toward relieving symptoms such as shortness of breath, decreased exercise tolerance, and extremity edema.    Expected Outcomes Improve functional capacity of life;Short term: Attendance in program 2-3 days a week with increased exercise capacity. Reported lower sodium intake. Reported increased fruit and vegetable intake. Reports medication compliance.;Short term: Daily weights obtained and reported for increase. Utilizing diuretic protocols set by physician.;Long term: Adoption  of self-care skills and reduction of barriers for early signs and symptoms recognition and intervention leading to self-care maintenance.    Hypertension Yes    Intervention Provide education on lifestyle modifcations including regular physical activity/exercise, weight management, moderate sodium restriction and increased consumption of fresh fruit, vegetables, and low fat dairy, alcohol moderation, and smoking cessation.;Monitor prescription use compliance.    Expected Outcomes Short Term: Continued assessment and intervention until BP is < 140/963mHG in hypertensive participants. < 130/8012mG in hypertensive participants with diabetes, heart failure or chronic kidney disease.;Long Term: Maintenance of blood pressure at goal levels.    Lipids Yes    Intervention Provide education and support for participant on nutrition & aerobic/resistive exercise along with prescribed medications to achieve LDL <37m69mDL >40mg48m Expected Outcomes Short Term: Participant states understanding of desired cholesterol values and is compliant with medications prescribed. Participant is following exercise prescription and nutrition guidelines.;Long Term: Cholesterol controlled with medications as prescribed, with individualized exercise RX and with personalized nutrition plan. Value goals: LDL < 37mg,61m > 40 mg.    Stress Yes    Intervention Offer individual and/or small group education and counseling on adjustment to heart disease, stress management and health-related lifestyle change. Teach and support self-help strategies.;Refer participants experiencing significant psychosocial distress to appropriate mental health specialists for further evaluation and treatment. When possible, include family members and significant others in education/counseling sessions.    Expected Outcomes Short Term: Participant demonstrates changes in health-related behavior, relaxation and other stress management skills, ability to obtain  effective social support, and compliance with psychotropic medications if prescribed.;Long Term: Emotional wellbeing is indicated by absence of clinically significant psychosocial distress or social isolation.  Personal Goals Discharge:  Goals and Risk Factor Review     Row Name 10/11/21 1315 10/24/21 1746 11/15/21 1111         Core Components/Risk Factors/Patient Goals Review   Personal Goals Review Weight Management/Obesity;Heart Failure;Stress;Hypertension;Improve shortness of breath with ADL's;Diabetes;Lipids Weight Management/Obesity;Heart Failure;Stress;Hypertension;Improve shortness of breath with ADL's;Diabetes;Lipids Weight Management/Obesity;Heart Failure;Stress;Hypertension;Improve shortness of breath with ADL's;Diabetes;Lipids     Review Carolyn Salazar started cardiac rehab on 10/10/21. Carolyn Salazar did well with exercise for her fitness level. Carolyn Salazar is deconditoined. Vital signs and CBG's were stable Carolyn Salazar started cardiac rehab on 10/10/21. Carolyn Salazar is off to a good start to exercise. Vital signs and CBG's have been stable. Carolyn Salazar reports that she does not feel as short of breath when she goes up and down the stairs since starting cardiac rehab. Carolyn Salazar's met levels have slowly increased Vital signs and CBG's have been stable. Carolyn Salazar's weights have been variable.     Expected Outcomes Carolyn Salazar will continue to participate in phase 2 cardiac rehab for exercise, nutrition and lifestyle modifications Carolyn Salazar will continue to participate in phase 2 cardiac rehab for exercise, nutrition and lifestyle modifications Carolyn Salazar will continue to participate in phase 2 cardiac rehab for exercise, nutrition and lifestyle modifications              Exercise Goals and Review:  Exercise Goals     Row Name 10/06/21 1354             Exercise Goals   Increase Physical Activity Yes       Intervention Provide advice, education, support and counseling about physical activity/exercise needs.;Develop an individualized exercise  prescription for aerobic and resistive training based on initial evaluation findings, risk stratification, comorbidities and participant's personal goals.       Expected Outcomes Short Term: Attend rehab on a regular basis to increase amount of physical activity.;Long Term: Add in home exercise to make exercise part of routine and to increase amount of physical activity.;Long Term: Exercising regularly at least 3-5 days a week.       Increase Strength and Stamina Yes       Intervention Provide advice, education, support and counseling about physical activity/exercise needs.;Develop an individualized exercise prescription for aerobic and resistive training based on initial evaluation findings, risk stratification, comorbidities and participant's personal goals.       Expected Outcomes Short Term: Increase workloads from initial exercise prescription for resistance, speed, and METs.;Short Term: Perform resistance training exercises routinely during rehab and add in resistance training at home;Long Term: Improve cardiorespiratory fitness, muscular endurance and strength as measured by increased METs and functional capacity (6MWT)       Able to understand and use rate of perceived exertion (RPE) scale Yes       Intervention Provide education and explanation on how to use RPE scale       Expected Outcomes Short Term: Able to use RPE daily in rehab to express subjective intensity level;Long Term:  Able to use RPE to guide intensity level when exercising independently       Able to understand and use Dyspnea scale Yes       Intervention Provide education and explanation on how to use Dyspnea scale       Expected Outcomes Short Term: Able to use Dyspnea scale daily in rehab to express subjective sense of shortness of breath during exertion;Long Term: Able to use Dyspnea scale to guide intensity level when exercising independently       Knowledge and understanding of Target  Heart Rate Range (THRR) Yes        Intervention Provide education and explanation of THRR including how the numbers were predicted and where they are located for reference       Expected Outcomes Short Term: Able to state/look up THRR;Long Term: Able to use THRR to govern intensity when exercising independently;Short Term: Able to use daily as guideline for intensity in rehab       Understanding of Exercise Prescription Yes       Intervention Provide education, explanation, and written materials on patient's individual exercise prescription       Expected Outcomes Short Term: Able to explain program exercise prescription;Long Term: Able to explain home exercise prescription to exercise independently                Exercise Goals Re-Evaluation:  Exercise Goals Re-Evaluation     Row Name 10/10/21 1139 10/24/21 1018 10/28/21 1038 11/25/21 1140 11/28/21 1045     Exercise Goal Re-Evaluation   Exercise Goals Review Increase Physical Activity;Able to understand and use rate of perceived exertion (RPE) scale Increase Physical Activity;Able to understand and use rate of perceived exertion (RPE) scale Increase Physical Activity;Able to understand and use rate of perceived exertion (RPE) scale;Understanding of Exercise Prescription;Increase Strength and Stamina;Knowledge and understanding of Target Heart Rate Range (THRR) Increase Physical Activity;Able to understand and use rate of perceived exertion (RPE) scale;Understanding of Exercise Prescription;Increase Strength and Stamina;Knowledge and understanding of Target Heart Rate Range (THRR) Increase Physical Activity;Able to understand and use rate of perceived exertion (RPE) scale;Understanding of Exercise Prescription;Increase Strength and Stamina;Knowledge and understanding of Target Heart Rate Range (THRR)   Comments Patient able to understand and use RPE scale appropriately. Patient is making gradual progress with exercise. Patient's goal is to decrease SOB with ADLs. Increased WL on  NuStep and tolerated well. Reviewed exercise prescription with patient. [facility], Wednesday, and Friday and water aerobics 1 day/week as her mode of home exercise. Patient's plan is to eventually resume swimming and water aerobics. Will continue to progress workloads at cardiac rehab to help build stamina and confidence to return to previous exercise activity. Encouraged patient to exercise as tolerated water walking, slow swimming, and/or walking at home. Patient states that she's not as short of breath now as previously and she was able to tolerate doing her laundry yesterday and got some steps in while doing her laundry. Patient scheduled to complete cardiac rehab next week and is making gradual progress with exercise. Patient's functional capacity increased 39% as measured by 6-minute walk test and distance increased 311 feet. Patient plans to continue exercise swimming and water walking at least 45 minutes 3 days/week upon completion of the cardiac rehab program. Patient states she's feeling better. She's able to walk upstairs without "huffing and puffing" and she's been able to her laundry without as much difficulty.   Expected Outcomes Progress workloads as tolerated to help improve endurance and increase confidence with exercise. Continue to progress workloads as tolerated to help improve cardiorespiratory fitness and decrease SOB with ADLs. Patient will add home exercise as tolerated to help build stamina to resume swimming and water aerobics. Patient will continue exercise at home upon completion of the cardiac rehab program. Patient will swim and walk in the pool as her mode of exercise upon completion of the cardiac rehab program.    [place] Name 12/05/21 [phone]  Exercise Goal Re-Evaluation   Exercise Goals Review Increase Physical Activity;Able to understand and use rate of perceived exertion (RPE) scale;Understanding of Exercise  Prescription;Increase Strength and Stamina;Knowledge and understanding of Target Heart Rate Range (THRR)       Comments Patient completed the cardiac rehab program on 12/02/21 and made graudal progress, achieving 2.0 METs with exercise.       Expected Outcomes Patient will continue exercising swimming and walking in the pool to maintain health and fitness gains.                Nutrition & Weight - Outcomes:  Pre Biometrics - 10/06/21 1100       Pre Biometrics   Waist Circumference 51 inches    Hip Circumference 56 inches    Waist to Hip Ratio 0.91 %    Triceps Skinfold 33 mm    % Body Fat 50.2 %    Grip Strength 24 kg    Flexibility 10.5 in    Single Leg Stand 30 seconds             Post Biometrics - 12/02/21 1049        Post  Biometrics   Height 5' 6.25" (1.683 m)    Waist Circumference 50.5 inches    Hip Circumference 56.75 inches    Waist to Hip Ratio 0.89 %    Triceps Skinfold 34 mm    % Body Fat 50.4 %    Grip Strength 24 kg    Flexibility 10.5 in    Single Leg Stand 35 seconds             Nutrition:  Nutrition Therapy & Goals - 12/02/21 1112       Nutrition Therapy   Diet Heart Healthy Diet    Drug/Food Interactions Statins/Certain Fruits      Personal Nutrition Goals   Nutrition Goal Patient in include lean protein/plant protein, fruits, vegetables, whole grains, and low fat dairy as part of a heart healthy diet    Personal Goal #2 Patient to identify and limit food sources of saturated fat, trans fat, sodium, and refined carobhydrates    Personal Goal #3 Patient to limit to <1530m of sodium per day.    Comments Patient graduates today. She remains inconsistent with many dietary changes/goals. She continues many convenience type foods and eats out multiple times per week. She does have adequate nutriton knowledge of high sodium foods, high fiber foods, glycemic index, etc.      Intervention Plan   Intervention Prescribe, educate and counsel  regarding individualized specific dietary modifications aiming towards targeted core components such as weight, hypertension, lipid management, diabetes, heart failure and other comorbidities.    Expected Outcomes Long Term Goal: Adherence to prescribed nutrition plan.;Short Term Goal: Understand basic principles of dietary content, such as calories, fat, sodium, cholesterol and nutrients.             Nutrition Discharge:  Nutrition Assessments - 12/06/21 0901       Rate Your Plate Scores   Post Score 75             Education Questionnaire Score:  Knowledge Questionnaire Score - 12/05/21 1340       Knowledge Questionnaire Score   Pre Score 20/24    Post Score 22/24             Goals reviewed with patient; copy given to patient.Pt graduates from  Intensive/Traditional cardiac rehab program on 12/02/21  with completion of  23 exercise and education sessions. Pt maintained good attendance and progressed nicely during their participation in rehab as evidenced by increased MET level.   Medication list reconciled. Repeat  PHQ score-  0.  Pt has made significant lifestyle changes and should be commended for their success. Carolyn Salazar achieved their goals during cardiac rehab.   Pt plans to continue exercise at the [facility] water walking. Carolyn Salazar increased her distance by 311 feet. We are proud of Carolyn Salazar's progress! Carolyn Salazar reports feeling stronger after participating in cardiac rehab.Harrell Gave RN BSN

## 2021-12-02 ENCOUNTER — Encounter (HOSPITAL_COMMUNITY): Payer: Managed Care, Other (non HMO)

## 2021-12-02 ENCOUNTER — Encounter (HOSPITAL_COMMUNITY)
Admission: RE | Admit: 2021-12-02 | Discharge: 2021-12-02 | Disposition: A | Discharge status: Home / Self Care | Payer: Managed Care, Other (non HMO) | Source: Ambulatory Visit | Attending: Cardiology

## 2021-12-02 VITALS — BP 104/70 | HR 100 | Ht 66.25 in | Wt 250.7 lb

## 2021-12-02 DIAGNOSIS — I5022 Chronic systolic (congestive) heart failure: Secondary | ICD-10-CM | POA: Diagnosis not present

## 2021-12-26 ENCOUNTER — Other Ambulatory Visit: Payer: Self-pay | Admitting: Family Medicine

## 2021-12-26 ENCOUNTER — Other Ambulatory Visit: Payer: Self-pay | Admitting: Cardiovascular Disease

## 2021-12-26 DIAGNOSIS — E118 Type 2 diabetes mellitus with unspecified complications: Secondary | ICD-10-CM

## 2022-01-25 ENCOUNTER — Encounter: Payer: Self-pay | Admitting: Internal Medicine

## 2022-02-08 ENCOUNTER — Ambulatory Visit: Payer: Managed Care, Other (non HMO) | Attending: Cardiology | Admitting: Cardiology

## 2022-02-08 ENCOUNTER — Encounter: Payer: Self-pay | Admitting: Cardiology

## 2022-02-08 VITALS — BP 100/70 | HR 103 | Ht 66.0 in | Wt 248.4 lb

## 2022-02-08 DIAGNOSIS — R0989 Other specified symptoms and signs involving the circulatory and respiratory systems: Secondary | ICD-10-CM

## 2022-02-08 DIAGNOSIS — Z01812 Encounter for preprocedural laboratory examination: Secondary | ICD-10-CM

## 2022-02-08 DIAGNOSIS — Z79899 Other long term (current) drug therapy: Secondary | ICD-10-CM | POA: Diagnosis not present

## 2022-02-08 DIAGNOSIS — I428 Other cardiomyopathies: Secondary | ICD-10-CM

## 2022-02-08 DIAGNOSIS — Z9289 Personal history of other medical treatment: Secondary | ICD-10-CM | POA: Diagnosis not present

## 2022-02-08 DIAGNOSIS — E118 Type 2 diabetes mellitus with unspecified complications: Secondary | ICD-10-CM

## 2022-02-08 NOTE — Patient Instructions (Addendum)
Medication Instructions:  Your physician recommends that you continue on your current medications as directed. Please refer to the Current Medication list given to you today.  *If you need a refill on your cardiac medications before your next appointment, please call your pharmacy*   Lab Work: TODAY: BMET, Mag, CBC If you have labs (blood work) drawn today and your tests are completely normal, you will receive your results only by: [place] (if you have MyChart) OR A paper copy in the mail If you have any lab test that is abnormal or we need to change your treatment, we will call you to review the results.   Testing/Procedures:    You will be scheduled for Cardiac MRI. Please arrive 30-45 minutes prior to test start time. ?  [facility] [place], [place] [address] [phone] Please take advantage of the free valet parking available at the MAIN entrance (A entrance).  Proceed to the [facility] Radiology Department (First Floor) for check-in.   Magnetic resonance imaging (MRI) is a painless test that produces images of the inside of the body without using Xrays.  During an MRI, strong magnets and radio waves work together in a [employer], political party to form detailed images.   MRI images may provide more details about a medical condition than X-rays, CT scans, and ultrasounds can provide.  You may be given earphones to listen for instructions.  You may eat a light breakfast and take medications as ordered with the exception of HCTZ (fluid pill, other). Please avoid stimulants for 12 hr prior to test. (Ie. Caffeine, nicotine, chocolate, or antihistamine medications)  If a contrast material will be used, an IV will be inserted into one of your veins. Contrast material will be injected into your IV. It will leave your body through your urine within a day. You may be told to drink plenty of fluids to help flush the contrast material out of your system.  You  will be asked to remove all metal, including: Watch, jewelry, and other metal objects including hearing aids, hair pieces and dentures. Also wearable glucose monitoring systems (ie. Freestyle Libre and Omnipods) (Braces and fillings normally are not a problem.)   TEST WILL TAKE APPROXIMATELY 1 HOUR  PLEASE NOTIFY SCHEDULING AT LEAST 24 HOURS IN ADVANCE IF YOU ARE UNABLE TO KEEP YOUR APPOINTMENT. [phone]  Please call Marchia Bond, cardiac imaging nurse navigator with any questions/concerns. Marchia Bond RN Navigator Cardiac Imaging Gordy Clement RN Navigator Cardiac Imaging [facility] [phone] Office   Follow-Up: At [facility], you and your health needs are our priority.  As part of our continuing mission to provide you with exceptional heart care, we have created designated Provider Care Teams.  These Care Teams include your primary Cardiologist (physician) and Advanced Practice Providers (APPs -  Physician Assistants and Nurse Practitioners) who all work together to provide you with the care you need, when you need it.  We recommend signing up for the patient portal called "MyChart".  Sign up information is provided on this After Visit Summary.  MyChart is used to connect with patients for Virtual Visits (Telemedicine).  Patients are able to view lab/test results, encounter notes, upcoming appointments, etc.  Non-urgent messages can be sent to your provider as well.   To learn more about what you can do with MyChart, go to [email or web address].    Your next appointment:   6 month(s)  The format for your next appointment:  In Person  Provider:   Berniece Salines, DO     Other Instructions   Important Information About Sugar

## 2022-02-08 NOTE — Progress Notes (Signed)
Cardiology Office Note:    Date:  02/08/2022   ID:  Carolyn Salazar, DOB 06-Aug-1970, MRN [id number]  PCP:  Rita Ohara, MD  Cardiologist:  Berniece Salines, DO  Electrophysiologist:  None   Referring MD: Rita Ohara, MD   " I am having some palpitations"  History of Present Illness:    Carolyn Salazar is a 51 y.o. female with a hx of nonischemic cardiomyopathy which based on chart review going back to 2000 she had a ejection fraction of 20%, Catheterization was done then when she lived in [place], revealed normal coronaries.  In 2017 she had some chest pain.  Nuclear stress test was abnormal.  Diagnostic catheterization in March 2017 showed no significant coronary disease.  Her ejection fraction then was 45 to 50%, diabetes mellitus on metformin and Jardiance, hypertension, GERD.   I saw the patient on April 04, 2021 at that time she was present significant shortness of breath on exertion.  And daytime somnolence.  With this I recommend we get a echocardiogram to reassess her LV function.  We also talked about the benefits of sleep study at that time.  Data vitamin D level and the patient I refilled her Lasix.   I saw the patient on May 07, 2019 to discuss her echocardiogram result.  Prior to her visit I had transition the patient to [facility].  Given her echocardiogram results and the patient for irregular heart catheterization given the fact that she was still short of breath.  I saw the patient on June 07, 2021 at that time we discussed optimizing her current medical therapy.  I continue Entresto, carvedilol, Jardiance and added low-dose Aldactone 12.5 mg.  She is here today for follow-up visit.  At her last visit in March 2023 we talked about the fact that she could be experiencing some deconditioning.  I was unable to increase any of her medications due to low blood pressure so I kept her on her dose of Entresto 24-26 mg twice a day, Aldactone 12.5 mg daily, Jardiance 10 mg daily and  carvedilol 25 mg twice a day daily.     Since I saw the patient she tells me that she has gotten a divorce.  She is very happy about this.  No chest pain, no shortness of breath.  She looks very relaxed.  Past Medical History:  Diagnosis Date   Allergy    Arthritis    Hands, Knees RT>LT   BENIGN NEOPLASM OF SKIN SITE UNSPECIFIED    benign mole   Blood transfusion without reported diagnosis    BUNIONS, BILATERAL    CARDIOMYOPATHY 02/1999   EF 20% in 02/1999, improved over time-  EF 40-45% at cath 2017   Chest pain    normal coronaries 2000 and 2017 (after an abnormal Myoview)   CHF (congestive heart failure) (HCC)    DEPRESSION    DIABETES MELLITUS, TYPE II, CONTROLLED, MILD    DYSLIPIDEMIA    GERD (gastroesophageal reflux disease)    Hypotension    November, 1607   METABOLIC SYNDROME X    hypertriglycerides 04/2008, hyperglycemia   MIGRAINE HEADACHE    NASH (nonalcoholic steatohepatitis)    OBESITY    Shingles (herpes zoster) polyneuropathy    Sinus tachycardia     Past Surgical History:  Procedure Laterality Date   BUNIONECTOMY  2012   LEFT   BUNIONECTOMY Right    CARDIAC CATHETERIZATION  2000   no CAD   CARDIAC CATHETERIZATION N/A 08/25/2015   Procedure: Left  Heart Cath and Coronary Angiography;  Surgeon: Troy Sine, MD;  Location: [place] [facility];  Service: Cardiovascular;  Laterality: N/A;   heart biopsy     mole removed     right arm age 68   RIGHT/LEFT HEART CATH AND CORONARY ANGIOGRAPHY N/A 05/18/2021   Procedure: RIGHT/LEFT HEART CATH AND CORONARY ANGIOGRAPHY;  Surgeon: Burnell Blanks, MD;  Location: [place] [facility];  Service: Cardiovascular;  Laterality: N/A;   vein scope      Current Medications: Current Meds  Medication Sig   ARIPiprazole (ABILIFY) 15 MG tablet Take 15 mg by mouth daily.   aspirin-acetaminophen-caffeine (EXCEDRIN MIGRAINE) 250-250-65 MG tablet Take 3 tablets by mouth daily as needed for headache.   atorvastatin  (LIPITOR) 10 MG tablet TAKE 1 TABLET BY MOUTH EVERY DAY   b complex vitamins tablet Take 1 tablet by mouth daily.   carvedilol (COREG) 25 MG tablet TAKE 1 TABLET (25 MG TOTAL) BY MOUTH 2 (TWO) TIMES DAILY WITH A MEAL. KEEP FOLLOW UP APPOINTMENT FOR FUTURE REFILLS.   cetirizine (ZYRTEC) 10 MG tablet Take 10 mg by mouth daily.     Cholecalciferol (DIALYVITE VITAMIN D 5000) 125 MCG (5000 UT) capsule Take 5,000 Units by mouth daily.   Collagen-Boron-Hyaluronic Acid (MOVE FREE ULTRA JOINT HEALTH) 40-5-3.3 MG TABS Take 1 tablet by mouth daily.   Continuous Blood Gluc Sensor (FREESTYLE LIBRE 3 SENSOR) MISC 1 EACH BY DOES NOT APPLY ROUTE EVERY 14 (FOURTEEN) DAYS.   diclofenac Sodium (VOLTAREN) 1 % GEL Apply 1 application. topically daily as needed (pain).   empagliflozin (JARDIANCE) 25 MG TABS tablet Take 1 tablet (25 mg total) by mouth daily.   esomeprazole (NEXIUM) 20 MG capsule Take 20 mg by mouth daily.   Eszopiclone 3 MG TABS Take 3 mg by mouth at bedtime.   FLUoxetine (PROZAC) 40 MG capsule Take 80 mg by mouth daily.   furosemide (LASIX) 40 MG tablet Take 1 tablet (40 mg total) by mouth daily.   Glucose Blood (BLOOD GLUCOSE TEST STRIPS) STRP Test 1-2 times a day. Pt uses onetouch verio flex meter. Dx e11.9   glucose blood test strip Test once a day. Pt uses one touch verio meter   KLOR-CON M20 20 MEQ tablet TAKE 1 TABLET BY MOUTH EVERY DAY   lamoTRIgine (LAMICTAL) 200 MG tablet Take 400 mg by mouth daily.   Lancets (ONETOUCH DELICA PLUS HUDJSH70Y) MISC Test 1-2 times a day. Pt uses one touch verio flex meter dx e11.9   metFORMIN (GLUCOPHAGE) 1000 MG tablet Take 1 tablet (1,000 mg total) by mouth 2 (two) times daily with a meal.   olopatadine (PATANOL) 0.1 % ophthalmic solution Place 1 drop into both eyes 2 (two) times daily as needed for allergies.   Omega-3 Fatty Acids (FISH OIL) 1000 MG CAPS Take 3,000 mg by mouth daily.   Probiotic Product (PROBIOTIC PO) Take 2 capsules by mouth daily.    sacubitril-valsartan (ENTRESTO) 24-26 MG Take 1 tablet by mouth 2 (two) times daily.   spironolactone (ALDACTONE) 25 MG tablet Take 0.5 tablets (12.5 mg total) by mouth daily.   Ubrogepant (UBRELVY) 100 MG TABS Take 1 tablet at onset of migraine.  May repeat in 2 hours if needed (max dose 269m/24 hr)   Current Facility-Administered Medications for the 02/08/22 encounter (Office Visit) with TBerniece Salines DO  Medication   levonorgestrel (MIRENA) 20 MCG/24HR IUD     Allergies:   Sulfonamide derivatives   Social History   Socioeconomic History  Marital status: Married    Spouse name: Not on file   Number of children: 1   Years of education: 44   Highest education level: Not on file  Occupational History   Occupation: Disabled  Tobacco Use   Smoking status: Never   Smokeless tobacco: Never  Vaping Use   Vaping Use: Never used  Substance and Sexual Activity   Alcohol use: Not Currently    Comment: 1 glass of wine every other week.    Drug use: No   Sexual activity: Not Currently    Birth control/protection: Other-see comments    Comment: TUBAL LIGATION, 1st intercourse- 20, partners- 3  Other Topics Concern   Not on file  Social History Narrative   Separated 08/2021, Lives with 2 cats   1 daughter, married and lives in [place].      Epworth Sleepiness Scale = 10 (as of 07/21/2015)   Social Determinants of Health   Financial Resource Strain: Not on file  Food Insecurity: Not on file  Transportation Needs: Not on file  Physical Activity: Not on file  Stress: Not on file  Social Connections: Not on file     Family History: The patient's family history includes Arthritis in her sister and another family member; Cancer in her mother; Diabetes in her father and mother; Diabetes type II in her mother; Heart attack in her father; Heart failure in her father; Hyperlipidemia in her mother and another family member; Hypertension in an other family member. There is no history of Colon  cancer, Colon polyps, Esophageal cancer, Rectal cancer, or Stomach cancer.  ROS:   Review of Systems  Constitution: Negative for decreased appetite, fever and weight gain.  HENT: Negative for congestion, ear discharge, hoarse voice and sore throat.   Eyes: Negative for discharge, redness, vision loss in right eye and visual halos.  Cardiovascular: Negative for chest pain, dyspnea on exertion, leg swelling, orthopnea and palpitations.  Respiratory: Negative for cough, hemoptysis, shortness of breath and snoring.   Endocrine: Negative for heat intolerance and polyphagia.  Hematologic/Lymphatic: Negative for bleeding problem. Does not bruise/bleed easily.  Skin: Negative for flushing, nail changes, rash and suspicious lesions.  Musculoskeletal: Negative for arthritis, joint pain, muscle cramps, myalgias, neck pain and stiffness.  Gastrointestinal: Negative for abdominal pain, bowel incontinence, diarrhea and excessive appetite.  Genitourinary: Negative for decreased libido, genital sores and incomplete emptying.  Neurological: Negative for brief paralysis, focal weakness, headaches and loss of balance.  Psychiatric/Behavioral: Negative for altered mental status, depression and suicidal ideas.  Allergic/Immunologic: Negative for HIV exposure and persistent infections.    EKGs/Labs/Other Studies Reviewed:    The following studies were reviewed today:   EKG: None today  R/L heart catheterization No angiographic evidence of CAD Normal right and left heart pressures 3.   Non-ischemic cardiomyopathy   Recommendations: No further ischemic workup. Her cardiomyopathy is felt to be non-ischemic.      Transthoracic echocardiogram April 22, 2021 IMPRESSIONS     1. Left ventricular ejection fraction, by estimation, is 40%. The left  ventricle has moderately decreased function. The left ventricle  demonstrates regional wall motion abnormalities (see scoring  diagram/findings for  description). There is mild  asymmetric left ventricular hypertrophy of the infero-lateral segment.  Left ventricular diastolic parameters are consistent with Grade I  diastolic dysfunction (impaired relaxation).   2. Right ventricular systolic function is normal. The right ventricular  size is normal. Tricuspid regurgitation signal is inadequate for assessing  PA pressure.  3. The mitral valve is normal in structure. Trivial mitral valve  regurgitation. No evidence of mitral stenosis.   4. The aortic valve is tricuspid. Aortic valve regurgitation is not  visualized. No aortic stenosis is present.   5. The inferior vena cava is normal in size with greater than 50%  respiratory variability, suggesting right atrial pressure of 3 mmHg.   FINDINGS   Left Ventricle: Left ventricular ejection fraction, by estimation, is  40%. The left ventricle has moderately decreased function. The left  ventricle demonstrates regional wall motion abnormalities. The left  ventricular internal cavity size was normal in  size. There is mild asymmetric left ventricular hypertrophy of the  infero-lateral segment. Left ventricular diastolic parameters are  consistent with Grade I diastolic dysfunction (impaired relaxation).      LV Wall Scoring:  The apical lateral segment, apical septal segment, apical anterior  segment,  and apical inferior segment are hypokinetic.   Right Ventricle: The right ventricular size is normal. No increase in  right ventricular wall thickness. Right ventricular systolic function is  normal. Tricuspid regurgitation signal is inadequate for assessing PA  pressure.   Left Atrium: Left atrial size was normal in size.   Right Atrium: Right atrial size was normal in size.   Pericardium: There is no evidence of pericardial effusion.   Mitral Valve: The mitral valve is normal in structure. Trivial mitral  valve regurgitation. No evidence of mitral valve stenosis.   Tricuspid  Valve: The tricuspid valve is normal in structure. Tricuspid  valve regurgitation is trivial. No evidence of tricuspid stenosis.   Aortic Valve: The aortic valve is tricuspid. Aortic valve regurgitation is  not visualized. No aortic stenosis is present.   Pulmonic Valve: The pulmonic valve was normal in structure. Pulmonic valve  regurgitation is trivial. No evidence of pulmonic stenosis.   Aorta: The aortic root is normal in size and structure.   Venous: The inferior vena cava is normal in size with greater than 50%  respiratory variability, suggesting right atrial pressure of 3 mmHg.   IAS/Shunts: The interatrial septum was not well visualized  Recent Labs: 03/01/2021: ALT 22 05/13/2021: Platelets 396 05/18/2021: Hemoglobin 13.9 06/06/2021: TSH 2.900 06/07/2021: BUN 11; Creatinine, Ser 0.74; Magnesium 1.9; Potassium 3.8; Sodium 141  Recent Lipid Panel    Component Value Date/Time   CHOL 170 06/06/2021 0951   TRIG 206 (H) 06/06/2021 0951   HDL 47 06/06/2021 0951   CHOLHDL 3.6 06/06/2021 0951   CHOLHDL 4.5 03/29/2016 0907   VLDL 78 (H) 03/29/2016 0907   LDLCALC 88 06/06/2021 0951   LDLDIRECT 130.8 01/15/2014 1209    Physical Exam:    VS:  BP 100/70   Pulse (!) 103   Ht 5' 6"  (1.676 m)   Wt 248 lb 6.4 oz (112.7 kg)   SpO2 96%   BMI 40.09 kg/m     Wt Readings from Last 3 Encounters:  02/08/22 248 lb 6.4 oz (112.7 kg)  12/02/21 250 lb 10.6 oz (113.7 kg)  11/25/21 251 lb 5.2 oz (114 kg)     GEN: Well nourished, well developed in no acute distress HEENT: Normal NECK: No JVD; No carotid bruits LYMPHATICS: No lymphadenopathy CARDIAC: S1S2 noted,RRR, no murmurs, rubs, gallops RESPIRATORY:  Clear to auscultation without rales, wheezing or rhonchi  ABDOMEN: Soft, non-tender, non-distended, +bowel sounds, no guarding. EXTREMITIES: No edema, No cyanosis, no clubbing MUSCULOSKELETAL:  No deformity  SKIN: Warm and dry NEUROLOGIC:  Alert and oriented x 3,  non-focal  PSYCHIATRIC:  Normal affect, good insight  ASSESSMENT:    1. Depressed left ventricular ejection fraction   2. Medication management   3. Pre-procedure lab exam   4. History of sleep study   5. NICM (nonischemic cardiomyopathy) ([place])   6. Type 2 diabetes mellitus with complication, without long-term current use of insulin (HCC)     PLAN:     Clinically she appears to be at her baseline.  Unfortunately I am unable to increase her guideline directed medical therapy.  It is time for [place] to reassess her LV function.  I am going to do a cardiac MRI.  As noted above Her blood pressure is not going to support any increase in her guideline medical therapy so we will keep the patient on Entresto 24-26 mg twice a day, Aldactone 12.5 mg daily, Jardiance 10 mg daily, Coreg 25 mg twice daily.  She has had a sleep study and will like to discuss the results, I will review for her to our sleep clinic.   The patient understands the need to lose weight with diet and exercise. We have discussed specific strategies for this.  The patient is in agreement with the above plan. The patient left the office in stable condition.  The patient will follow up in 14-monthsooner if needed.   Medication Adjustments/Labs and Tests Ordered: Current medicines are reviewed at length with the patient today.  Concerns regarding medicines are outlined above.  Orders Placed This Encounter  Procedures   MR CARDIAC MORPHOLOGY W WO CONTRAST   Basic Metabolic Panel (BMET)   Magnesium   CBC with Differential/Platelet   Ambulatory referral to Sleep Studies   No orders of the defined types were placed in this encounter.   Patient Instructions  Medication Instructions:  Your physician recommends that you continue on your current medications as directed. Please refer to the Current Medication list given to you today.  *If you need a refill on your cardiac medications before your next appointment, please call your  pharmacy*   Lab Work: TODAY: BMET, Mag, CBC If you have labs (blood work) drawn today and your tests are completely normal, you will receive your results only by: [place](if you have MyChart) OR A paper copy in the mail If you have any lab test that is abnormal or we need to change your treatment, we will call you to review the results.   Testing/Procedures:    You will be scheduled for Cardiac MRI. Please arrive 30-45 minutes prior to test start time. ?  [facility] [place] [id number]([phone] take advantage of the free valet parking available at the MAIN entrance (A entrance).  [facility] (First [facility]) for check-in.   Magnetic resonance imaging (MRI) is a painless test that produces images of the inside of the body without using Xrays.  During an MRI, strong magnets and radio waves work together in a mResearch officer, political partyto form detailed images.   MRI images may provide more details about a medical condition than X-rays, CT scans, and ultrasounds can provide.  You may be given earphones to listen for instructions.  You may eat a light breakfast and take medications as ordered with the exception of HCTZ (fluid pill, other). Please avoid stimulants for 12 hr prior to test. (Ie. Caffeine, nicotine, chocolate, or antihistamine medications)  If a contrast material will be used, an IV will be inserted into one of  your veins. Contrast material will be injected into your IV. It will leave your body through your urine within a day. You may be told to drink plenty of fluids to help flush the contrast material out of your system.  You will be asked to remove all metal, including: Watch, jewelry, and other metal objects including hearing aids, hair pieces and dentures. Also wearable glucose monitoring systems (ie. Freestyle Libre and Omnipods) (Braces and fillings normally are not a problem.)   TEST  WILL TAKE APPROXIMATELY 1 HOUR  PLEASE NOTIFY SCHEDULING AT LEAST 24 HOURS IN ADVANCE IF YOU ARE UNABLE TO KEEP YOUR APPOINTMENT. [phone]  Please call Marchia Bond, cardiac imaging nurse navigator with any questions/concerns. Marchia Bond RN Navigator Cardiac Imaging Gordy Clement RN Navigator Cardiac Imaging [facility] [phone] Office   Follow-Up: At [facility], you and your health needs are our priority.  As part of our continuing mission to provide you with exceptional heart care, we have created designated Provider Care Teams.  These Care Teams include your primary Cardiologist (physician) and Advanced Practice Providers (APPs -  Physician Assistants and Nurse Practitioners) who all work together to provide you with the care you need, when you need it.  We recommend signing up for the patient portal called "MyChart".  Sign up information is provided on this After Visit Summary.  MyChart is used to connect with patients for Virtual Visits (Telemedicine).  Patients are able to view lab/test results, encounter notes, upcoming appointments, etc.  Non-urgent messages can be sent to your provider as well.   To learn more about what you can do with MyChart, go to [email or web address].    Your next appointment:   6 month(s)  The format for your next appointment:   In Person  Provider:   Berniece Salines, DO     Other Instructions   Important Information About Sugar        Adopting a Healthy Lifestyle.  Know what a healthy weight is for you (roughly BMI <25) and aim to maintain this   Aim for 7+ servings of fruits and vegetables daily   65-80+ fluid ounces of water or unsweet tea for healthy kidneys   Limit to max 1 drink of alcohol per day; avoid smoking/tobacco   Limit animal fats in diet for cholesterol and heart health - choose grass fed whenever available   Avoid highly processed foods, and foods high in saturated/trans  fats   Aim for low stress - take time to unwind and care for your mental health   Aim for 150 min of moderate intensity exercise weekly for heart health, and weights twice weekly for bone health   Aim for 7-9 hours of sleep daily   When it comes to diets, agreement about the perfect plan isnt easy to find, even among the experts. Experts at the [employer] developed an idea known as the Healthy Eating Plate. Just imagine a plate divided into logical, healthy portions.   The emphasis is on diet quality:   Load up on vegetables and fruits - one-half of your plate: Aim for color and variety, and remember that potatoes dont count.   Go for whole grains - one-quarter of your plate: Whole wheat, barley, wheat berries, quinoa, oats, brown rice, and foods made with them. If you want pasta, go with whole wheat pasta.   Protein power - one-quarter of your plate: Fish, chicken, beans, and nuts are all healthy, versatile  protein sources. Limit red meat.   The diet, however, does go beyond the plate, offering a few other suggestions.   Use healthy plant oils, such as olive, canola, soy, corn, sunflower and peanut. Check the labels, and avoid partially hydrogenated oil, which have unhealthy trans fats.   If youre thirsty, drink water. Coffee and tea are good in moderation, but skip sugary drinks and limit milk and dairy products to one or two daily servings.   The type of carbohydrate in the diet is more important than the amount. Some sources of carbohydrates, such as vegetables, fruits, whole grains, and beans-are healthier than others.   Finally, stay active  Signed, Berniece Salines, DO  02/08/2022 12:46 PM    [facility]

## 2022-02-09 LAB — CBC WITH DIFFERENTIAL/PLATELET
Basophils Absolute: 0.1 10*3/uL (ref 0.0–0.2)
Basos: 1 %
EOS (ABSOLUTE): 0.4 10*3/uL (ref 0.0–0.4)
Eos: 4 %
Hematocrit: 44.4 % (ref 34.0–46.6)
Hemoglobin: 14.5 g/dL (ref 11.1–15.9)
Immature Grans (Abs): 0 10*3/uL (ref 0.0–0.1)
Immature Granulocytes: 0 %
Lymphocytes Absolute: 3.1 10*3/uL (ref 0.7–3.1)
Lymphs: 31 %
MCH: 27.8 pg (ref 26.6–33.0)
MCHC: 32.7 g/dL (ref 31.5–35.7)
MCV: 85 fL (ref 79–97)
Monocytes Absolute: 0.6 10*3/uL (ref 0.1–0.9)
Monocytes: 6 %
Neutrophils Absolute: 5.9 10*3/uL (ref 1.4–7.0)
Neutrophils: 58 %
Platelets: 382 10*3/uL (ref 150–450)
RBC: 5.22 x10E6/uL (ref 3.77–5.28)
RDW: 13.9 % (ref 11.7–15.4)
WBC: 10 10*3/uL (ref 3.4–10.8)

## 2022-02-09 LAB — BASIC METABOLIC PANEL
BUN/Creatinine Ratio: 14 (ref 9–23)
BUN: 11 mg/dL (ref 6–24)
CO2: 23 mmol/L (ref 20–29)
Calcium: 9.4 mg/dL (ref 8.7–10.2)
Chloride: 101 mmol/L (ref 96–106)
Creatinine, Ser: 0.76 mg/dL (ref 0.57–1.00)
Glucose: 204 mg/dL — ABNORMAL HIGH (ref 70–99)
Potassium: 4.2 mmol/L (ref 3.5–5.2)
Sodium: 141 mmol/L (ref 134–144)
eGFR: 95 mL/min/{1.73_m2} (ref >59)

## 2022-02-09 LAB — MAGNESIUM: Magnesium: 1.9 mg/dL (ref 1.6–2.3)

## 2022-02-16 LAB — HM DIABETES EYE EXAM

## 2022-02-22 ENCOUNTER — Encounter: Payer: Self-pay | Admitting: *Deleted

## 2022-02-28 ENCOUNTER — Encounter: Payer: Self-pay | Admitting: Internal Medicine

## 2022-03-06 ENCOUNTER — Other Ambulatory Visit: Payer: Self-pay | Admitting: Family Medicine

## 2022-03-06 DIAGNOSIS — E118 Type 2 diabetes mellitus with unspecified complications: Secondary | ICD-10-CM

## 2022-03-15 NOTE — Patient Instructions (Incomplete)
  HEALTH MAINTENANCE RECOMMENDATIONS:  It is recommended that you get at least 30 minutes of aerobic exercise at least 5 days/week (for weight loss, you may need as much as 60-90 minutes). This can be any activity that gets your heart rate up. This can be divided in 10-15 minute intervals if needed, but try and build up your endurance at least once a week.  Weight bearing exercise is also recommended twice weekly.  Eat a healthy diet with lots of vegetables, fruits and fiber.  "Colorful" foods have a lot of vitamins (ie green vegetables, tomatoes, red peppers, etc).  Limit sweet tea, regular sodas and alcoholic beverages, all of which has a lot of calories and sugar.  Up to 1 alcoholic drink daily may be beneficial for women (unless trying to lose weight, watch sugars).  Drink a lot of water.  Calcium recommendations are 1200-1500 mg daily (1500 mg for postmenopausal women or women without ovaries), and vitamin D 1000 IU daily.  This should be obtained from diet and/or supplements (vitamins), and calcium should not be taken all at once, but in divided doses.  Monthly self breast exams and yearly mammograms for women over the age of 37 is recommended.  Sunscreen of at least SPF 30 should be used on all sun-exposed parts of the skin when outside between the hours of 10 am and 4 pm (not just when at beach or pool, but even with exercise, golf, tennis, and yard work!)  Use a sunscreen that says "broad spectrum" so it covers both UVA and UVB rays, and make sure to reapply every 1-2 hours.  Remember to change the batteries in your smoke detectors when changing your clock times in the spring and fall. Carbon monoxide detectors are recommended for your home.  Use your seat belt every time you are in a car, and please drive safely and not be distracted with cell phones and texting while driving.  I recommend getting the new shingles vaccine (Shingrix). You may want to check with your insurance to verify what  your out of pocket cost may be (usually covered as preventative, but better to verify to avoid any surprises, as this vaccine is expensive), and then schedule a nurse visit at our office when convenient (based on the possible side effects as discussed).   This is a series of 2 injections, spaced 2 months apart.  It doesn't have to be exactly 2 months apart (but can't be sooner), if that isn't feasible for your schedule, but try and get them close to 2 months (and definitely within 6 months of each other, or else the efficacy of the vaccine drops off). This should be separated from other vaccines by at least 2 weeks.  Please schedule your screening mammogram.  Please cut back on desserts and continue to try and exercise daily (go to your gym--exercise bike might be good for you).

## 2022-03-15 NOTE — Progress Notes (Signed)
Chief Complaint  Patient presents with   nonfasting cpe    Nonfasting cpe, no concerns, flu and covid shots today. Obgyn- lavoie last seen in 2021   Carolyn Salazar is a 51 y.o. female who presents for a complete physical and 6 month follow-up on chronic problems.    Diabetes:  Patient reports compliance with metformin and jardiance, and denies side effects. Occasional vaginal itching, not persistent, no discharge, no urinary complaints.  Last A1c was 6.4% in 08/2021. She has been using Freestyle Libre 3 monitor to help with controlling/monitoring her sugars. Hasn't had it on for a few weeks. Money has been tight since separation. She admits to eating more desserts for the last 5 months, expects her A1c to be higher. Per her Freestyle app on phone--morning sugars frequently 150 range, and >200 later in the day.  Above goal overall. Denies hypoglycemia. No polydipsia, polyuria. She checks her feet regularly and denies lesions/burning/concerns. She had diabetic eye exam last month, no retinopathy.   Nonischemic cardiomyopathy, under the care of Dr. [name], last seen a month ago.  She continues on Entresto, Aldactone, Jardiance and Coreg. Cardiac MRI was ordered, scheduled for December. She underwent cardiac rehab, through July. She is no longer afraid to go places, having less DOE.  She can now go to the grocery store and not get short of breath. Does some walking, not as active since rehab stopped. Denies headaches, dizziness, edema. Denies chest pain.   She had sleep study in March. When discussed with Dr. [name] last month, looks like she was going to refer to sleep doctor.  No appointments scheduled, pt hasn't heard anything. She states she never sleeps on her back (was told to for study). IMPRESSIONS - Moderate obstructive sleep apnea overall (AHI 18.2/h); however, sleep apnea was severe with supine sleep (AHI 30.4/h). - Moderate oxygen desaturation to a nadir of 84%. - Patient snored for  108.1 minutes (25.5%) during the sleep.   DIAGNOSIS - Obstructive Sleep Apnea (G47.33) - Nocturnal hypoxemia   RECOMMENDATIONS - In this patient with cardiovascular co-morbidities recommend therapeutic CPAP for treatment of her sleep disordered breathing. If unable to obtain an in-lab titration, initiate Auto-PAP with EPR of 3 at 7 - 18 cm of water.  - Effort should be made to optimize nasal and orophayngeal patency. - Positional therapy avoiding supine position during sleep. - Avoid alcohol, sedatives and other CNS depressants that may worsen sleep apnea and disrupt normal sleep architecture. - Sleep hygiene should be reviewed to assess factors that may improve sleep quality. - Weight management (BMI 39) and regular exercise should be initiated or continued. - Recommend a download and sleep clinic evaluation after one month of therapy.    Mixed hyperlipidemia:  Patient reports compliance with atorvastatin 61m and omega 3 fish oil 3000 mg daily.  She denies side effects. TG was elevated at 206 on this regimen in 05/2021, but sugars were also above goal (A1c >7).  No changes in meds were made. She is due for recheck. She isn't fasting today   She is trying to follow a lowfat diet.  Eating grilled chicken when eating out, trying to eat at home more. No alcohol, no juices. Eating more desserts (buying it and has it at home).  Lab Results  Component Value Date   CHOL 170 06/06/2021   HDL 47 06/06/2021   LDLCALC 88 06/06/2021   LDLDIRECT 130.8 01/15/2014   TRIG 206 (H) 06/06/2021   CHOLHDL 3.6 06/06/2021  Migraines:  Roselyn Meier was effective for her. Migraines are hormonal.  Migraines are usually once a month, for 1-2 days. No aura.   Vitamin D deficiency:  At one point she had increased dose to 10,000 IU daily.  Level in 05/2021 was normal at 58.8.  She was advised to continue dose at 5000 IU daily.  Due for recheck.  She is currently taking 5000 IU   Immunization History  Administered  Date(s) Administered   Influenza Whole 04/10/2007, 01/31/2011   Influenza,inj,Quad PF,6+ Mos 01/15/2014, 02/28/2016, 03/20/2018, 03/20/2019, 01/27/2020, 03/07/2021   Influenza-Unspecified 02/20/2012   PFIZER Comirnaty(Gray Top)Covid-19 Tri-Sucrose Vaccine 12/01/2020   PFIZER(Purple Top)SARS-COV-2 Vaccination 08/14/2019, 09/04/2019, 02/16/2020   Pfizer Covid-19 Vaccine Bivalent Booster 59yr & up 03/07/2021   Pneumococcal Polysaccharide-23 05/23/1999, 03/20/2019   Td 05/22/2008   Tdap 03/20/2019   Last Pap smear: 04/2019--ASCUS/HPV HR negative. Last GYN visit was 03/2021 (pap not repeated, though prev rec from 04/2019 was for pap 1 year) Has Mirena IUD (placed 05/2019 for menorrhagia) Last mammogram: 06/2020 Last colonoscopy: 01/2021 Dr. NSilverio Decamp ulceration noted. 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 Last DEXA: never Dentist: once a year Ophtho: yearly, last in September Exercise: Walks occasionally.  Was getting regular exercise in cardiac rehab through July.  Has a gym where she lives, plans to start going.  PMH, PSH, SH reviewed  Outpatient Encounter Medications as of 03/16/2022  Medication Sig Note   ARIPiprazole (ABILIFY) 15 MG tablet Take 15 mg by mouth daily.    aspirin-acetaminophen-caffeine (EXCEDRIN MIGRAINE) 250-250-65 MG tablet Take 3 tablets by mouth daily as needed for headache.    atorvastatin (LIPITOR) 10 MG tablet TAKE 1 TABLET BY MOUTH EVERY DAY    b complex vitamins tablet Take 1 tablet by mouth daily.    carvedilol (COREG) 25 MG tablet TAKE 1 TABLET (25 MG TOTAL) BY MOUTH 2 (TWO) TIMES DAILY WITH A MEAL. KEEP FOLLOW UP APPOINTMENT FOR FUTURE REFILLS.    cetirizine (ZYRTEC) 10 MG tablet Take 10 mg by mouth daily.      Cholecalciferol (DIALYVITE VITAMIN D 5000) 125 MCG (5000 UT) capsule Take 5,000 Units by mouth daily.     Collagen-Boron-Hyaluronic Acid (MOVE FREE ULTRA JOINT HEALTH) 40-5-3.3 MG TABS Take 1 tablet by mouth daily.    Continuous Blood Gluc Sensor (FREESTYLE LIBRE 3 SENSOR) MISC 1 EACH BY DOES NOT APPLY ROUTE EVERY 14 (FOURTEEN) DAYS.    diclofenac Sodium (VOLTAREN) 1 % GEL Apply 1 application. topically daily as needed (pain).    esomeprazole (NEXIUM) 20 MG capsule Take 20 mg by mouth daily.    Eszopiclone 3 MG TABS Take 3 mg by mouth at bedtime. 10/05/2021: Lunesta    FLUoxetine (PROZAC) 40 MG capsule Take 80 mg by mouth daily.    furosemide (LASIX) 40 MG tablet Take 1 tablet (40 mg total) by mouth daily.    JARDIANCE 25 MG TABS tablet TAKE 1 TABLET (25 MG TOTAL) BY MOUTH DAILY.    KLOR-CON M20 20 MEQ tablet TAKE 1 TABLET BY MOUTH EVERY DAY    lamoTRIgine (LAMICTAL) 200 MG tablet Take 400 mg by mouth daily.    metFORMIN (GLUCOPHAGE) 1000 MG tablet Take 1 tablet (1,000 mg total) by mouth 2 (two) times daily with a meal.    olopatadine (PATANOL) 0.1 % ophthalmic solution Place 1 drop into both eyes 2 (two) times daily as needed for allergies.    Omega-3 Fatty  Acids (FISH OIL) 1000 MG CAPS Take 3,000 mg by mouth daily.    Probiotic Product (PROBIOTIC PO) Take 1 capsule by mouth daily.    sacubitril-valsartan (ENTRESTO) 24-26 MG Take 1 tablet by mouth 2 (two) times daily.    spironolactone (ALDACTONE) 25 MG tablet Take 0.5 tablets (12.5 mg total) by mouth daily.    Ubrogepant (UBRELVY) 100 MG TABS Take 1 tablet at onset of migraine.  May repeat in 2 hours if needed (max dose 239m/24 hr)    [DISCONTINUED] Glucose Blood (BLOOD GLUCOSE TEST STRIPS) STRP Test 1-2 times a day. Pt uses onetouch verio flex meter. Dx e11.9    [DISCONTINUED] glucose blood test strip Test once a day. Pt uses one touch verio meter    [DISCONTINUED] Lancets (ONETOUCH DELICA PLUS [id number] MISC Test 1-2 times a day. Pt uses one touch verio flex meter dx e11.9    Facility-Administered Encounter Medications as of 03/16/2022   Medication   levonorgestrel (MIRENA) 20 MCG/24HR IUD   Allergies  Allergen Reactions   Sulfonamide Derivatives Swelling   ROS:  Denies fever, weight changes (slight gain), headaches,  vision changes, decreased hearing, ear pain, sore throat, breast concerns, chest pain, palpitations, dizziness, syncope, dyspnea on exertion (improved/stable), cough, swelling (intermittent), nausea, vomiting, diarrhea, constipation, abdominal pain, melena, hematochezia, indigestion/heartburn, hematuria, incontinence, dysuria, vaginal discharge, odor or itch, genital lesions, joint pains, numbness, tingling, weakness, tremor, suspicious skin lesions, abnormal bleeding/bruising, or enlarged lymph nodes. Moods are good, recently saw psych.   PHYSICAL EXAM:  BP 114/78   Pulse 68   Ht 5' 6"  (1.676 m)   Wt 246 lb 6.4 oz (111.8 kg)   BMI 39.77 kg/m   Wt Readings from Last 3 Encounters:  03/16/22 246 lb 6.4 oz (111.8 kg)  02/08/22 248 lb 6.4 oz (112.7 kg)  12/02/21 250 lb 10.6 oz (113.7 kg)   General Appearance:    Alert, cooperative, no distress, appears stated age  Head:    Normocephalic, without obvious abnormality, atraumatic  Eyes:    PERRL, conjunctiva/corneas clear, EOM's intact  Ears:    Normal TM's and external ear canals  Nose:   No drainage or sinus tenderness  Throat:   Normal mucosa  Neck:   Supple, no lymphadenopathy;  thyroid:  no enlargement/ tenderness/nodules; no JVD  Back:    Spine nontender, no curvature, ROM normal  Lungs:     Clear to auscultation bilaterally without wheezes, rales or ronchi; respirations unlabored  Chest Wall:    No tenderness or deformity   Heart:    Regular rate and rhythm, S1 and S2 normal, no murmur, rub or gallop  Breast Exam:    deferred to OB/GYN  Abdomen:     Soft, non-tender, nondistended, normoactive bowel sounds, no masses, no hepatosplenomegaly  Genitalia:    deferred to OB/GYN       Extremities:   No clubbing, cyanosis or edema  Pulses:   2+ and  symmetric all extremities  Skin:   Skin color, texture, turgor normal, no rashes or lesions  Lymph nodes:   Cervical, supraclavicular, and axillary nodes normal  Neurologic:   CNII-XII intact, normal strength, sensation and gait                                Psych:   Normal mood, affect, hygiene and grooming.                 Diabetic  foot exam--WHSS on both great toes (bunionectomies).  Callous on the left medial 1st MTP. Normal monofilament exam  Recent labs reviewed-- Had CBC, Mg and b-met in 01/2022.  Normal except glu 204 (nonfasting)     03/16/2022    1:46 PM 11/30/2021   12:04 PM 10/06/2021   11:45 AM 11/29/2017   11:26 AM 01/15/2014    5:31 PM  Depression screen PHQ 2/9  Decreased Interest 0 0 0 0 0  Down, Depressed, Hopeless 0 0 0 0 1  PHQ - 2 Score 0 0 0 0 1   Lab Results  Component Value Date   HGBA1C 7.0 (A) 03/16/2022    ASSESSMENT/PLAN:  Annual physical exam - Plan: POCT Urinalysis DIP (Proadvantage Device)  Type 2 diabetes mellitus with complication, without long-term current use of insulin (HCC) - Increased A1c related to eating more desserts. Counseled re: diet, exercise. Cont current meds. Monitor glu for feedback/accountability - Plan: HgB A1c, VITAMIN D 25 Hydroxy (Vit-D Deficiency, Fractures), Comprehensive metabolic panel  Hyperlipidemia associated with type 2 diabetes mellitus ([place]) - due for recheck, to return fasting. Suspect TG will be elevated related to poor dietary choices/elevated sugars - Plan: Lipid panel  NICM (nonischemic cardiomyopathy) (HCC) - stable, under care of cardiology. DOE improved after cardiac rehab. Encouraged ongoing exercise, wt loss  Chronic combined systolic and diastolic CHF (congestive heart failure) ([place]) - controlled on current regimen per cardiologist, with prn use of diuretic with weight gain/edema.  Moderate obstructive sleep apnea - refer for CPAP, with autotitration.  Weight loss and avoidance of sleeping on side  encouraged  Menstrual migraine without status migrainosus, not intractable - controlled with ubrelvy prn  Vitamin D deficiency - cont supplements - Plan: VITAMIN D 25 Hydroxy (Vit-D Deficiency, Fractures)  Medication monitoring encounter - Plan: Lipid panel, VITAMIN D 25 Hydroxy (Vit-D Deficiency, Fractures), Comprehensive metabolic panel  IHWTU-88 vaccine administered - Plan: Pfizer Fall 2023 Covid-19 Vaccine 84yr and older  Needs flu shot - Plan: Flu Vaccine QUAD 635moM (Fluarix, Fluzone & Alfiuria Quad PF)  Samples of Jardiance 2540m1 and Freestyle Libre 3 samples given   Discussed monthly self breast exams and yearly mammograms (past due, reminded to schedule); at least 30 minutes of aerobic activity at least 5 days/week, weight-bearing exercise at least 2x/week; proper sunscreen use reviewed; healthy diet, including goals of calcium and vitamin D intake and alcohol recommendations (less than or equal to 1 drink/day) reviewed; regular seatbelt use; changing batteries in smoke detectors.  Immunization recommendations discussed--flu shot and COVID booster given. Shingrix recommended, to return for [place] when convenient. Colonoscopy recommendations reviewed, UTD.   Return for fasting labs. Med check in 4 months.

## 2022-03-16 ENCOUNTER — Ambulatory Visit (INDEPENDENT_AMBULATORY_CARE_PROVIDER_SITE_OTHER): Payer: Managed Care, Other (non HMO) | Admitting: Family Medicine

## 2022-03-16 ENCOUNTER — Encounter: Payer: Self-pay | Admitting: Family Medicine

## 2022-03-16 VITALS — BP 114/78 | HR 68 | Ht 66.0 in | Wt 246.4 lb

## 2022-03-16 DIAGNOSIS — I428 Other cardiomyopathies: Secondary | ICD-10-CM

## 2022-03-16 DIAGNOSIS — Z0000 Encounter for general adult medical examination without abnormal findings: Secondary | ICD-10-CM

## 2022-03-16 DIAGNOSIS — E1169 Type 2 diabetes mellitus with other specified complication: Secondary | ICD-10-CM | POA: Diagnosis not present

## 2022-03-16 DIAGNOSIS — E785 Hyperlipidemia, unspecified: Secondary | ICD-10-CM

## 2022-03-16 DIAGNOSIS — E118 Type 2 diabetes mellitus with unspecified complications: Secondary | ICD-10-CM

## 2022-03-16 DIAGNOSIS — G43829 Menstrual migraine, not intractable, without status migrainosus: Secondary | ICD-10-CM

## 2022-03-16 DIAGNOSIS — Z23 Encounter for immunization: Secondary | ICD-10-CM

## 2022-03-16 DIAGNOSIS — I5042 Chronic combined systolic (congestive) and diastolic (congestive) heart failure: Secondary | ICD-10-CM

## 2022-03-16 DIAGNOSIS — G4733 Obstructive sleep apnea (adult) (pediatric): Secondary | ICD-10-CM

## 2022-03-16 DIAGNOSIS — E559 Vitamin D deficiency, unspecified: Secondary | ICD-10-CM

## 2022-03-16 DIAGNOSIS — Z5181 Encounter for therapeutic drug level monitoring: Secondary | ICD-10-CM

## 2022-03-16 LAB — POCT URINALYSIS DIP (PROADVANTAGE DEVICE)
Bilirubin, UA: NEGATIVE
Blood, UA: NEGATIVE
Glucose, UA: >=1000 mg/dL — AB
Ketones, POC UA: NEGATIVE mg/dL
Leukocytes, UA: NEGATIVE
Nitrite, UA: NEGATIVE
Protein Ur, POC: NEGATIVE mg/dL
Specific Gravity, Urine: 1.01
Urobilinogen, Ur: NEGATIVE
pH, UA: 6 (ref 5.0–8.0)

## 2022-03-16 LAB — POCT GLYCOSYLATED HEMOGLOBIN (HGB A1C): Hemoglobin A1C: 7 % — AB (ref 4.0–5.6)

## 2022-03-20 ENCOUNTER — Other Ambulatory Visit: Payer: Self-pay | Admitting: *Deleted

## 2022-03-20 DIAGNOSIS — G4733 Obstructive sleep apnea (adult) (pediatric): Secondary | ICD-10-CM

## 2022-03-22 ENCOUNTER — Other Ambulatory Visit: Payer: Managed Care, Other (non HMO)

## 2022-03-22 ENCOUNTER — Other Ambulatory Visit: Payer: Self-pay | Admitting: Cardiology

## 2022-03-22 DIAGNOSIS — E118 Type 2 diabetes mellitus with unspecified complications: Secondary | ICD-10-CM

## 2022-03-22 DIAGNOSIS — Z5181 Encounter for therapeutic drug level monitoring: Secondary | ICD-10-CM

## 2022-03-22 DIAGNOSIS — E1169 Type 2 diabetes mellitus with other specified complication: Secondary | ICD-10-CM

## 2022-03-22 DIAGNOSIS — E559 Vitamin D deficiency, unspecified: Secondary | ICD-10-CM

## 2022-03-23 LAB — VITAMIN D 25 HYDROXY (VIT D DEFICIENCY, FRACTURES): Vit D, 25-Hydroxy: 39.3 ng/mL (ref 30.0–100.0)

## 2022-03-23 LAB — COMPREHENSIVE METABOLIC PANEL
ALT: 26 IU/L (ref 0–32)
AST: 20 IU/L (ref 0–40)
Albumin/Globulin Ratio: 2.2 (ref 1.2–2.2)
Albumin: 4.3 g/dL (ref 3.9–4.9)
Alkaline Phosphatase: 97 IU/L (ref 44–121)
BUN/Creatinine Ratio: 19 (ref 9–23)
BUN: 13 mg/dL (ref 6–24)
Bilirubin Total: 0.7 mg/dL (ref 0.0–1.2)
CO2: 22 mmol/L (ref 20–29)
Calcium: 9.7 mg/dL (ref 8.7–10.2)
Chloride: 102 mmol/L (ref 96–106)
Creatinine, Ser: 0.67 mg/dL (ref 0.57–1.00)
Globulin, Total: 2 g/dL (ref 1.5–4.5)
Glucose: 177 mg/dL — ABNORMAL HIGH (ref 70–99)
Potassium: 3.7 mmol/L (ref 3.5–5.2)
Sodium: 140 mmol/L (ref 134–144)
Total Protein: 6.3 g/dL (ref 6.0–8.5)
eGFR: 106 mL/min/{1.73_m2} (ref >59)

## 2022-03-23 LAB — LIPID PANEL
Chol/HDL Ratio: 3.8 ratio (ref 0.0–4.4)
Cholesterol, Total: 153 mg/dL (ref 100–199)
HDL: 40 mg/dL (ref >39)
LDL Chol Calc (NIH): 69 mg/dL (ref 0–99)
Triglycerides: 271 mg/dL — ABNORMAL HIGH (ref 0–149)
VLDL Cholesterol Cal: 44 mg/dL — ABNORMAL HIGH (ref 5–40)

## 2022-04-02 ENCOUNTER — Other Ambulatory Visit: Payer: Self-pay | Admitting: Family Medicine

## 2022-04-02 DIAGNOSIS — G43829 Menstrual migraine, not intractable, without status migrainosus: Secondary | ICD-10-CM

## 2022-04-02 DIAGNOSIS — E118 Type 2 diabetes mellitus with unspecified complications: Secondary | ICD-10-CM

## 2022-04-03 NOTE — Telephone Encounter (Signed)
Is this okay to refill? 

## 2022-04-26 ENCOUNTER — Other Ambulatory Visit: Payer: Self-pay | Admitting: Cardiology

## 2022-04-26 ENCOUNTER — Other Ambulatory Visit: Payer: Self-pay | Admitting: Cardiovascular Disease

## 2022-04-26 NOTE — Telephone Encounter (Signed)
Patient of Dr. [name]. Please review for refill. Thank you!

## 2022-05-03 ENCOUNTER — Telehealth (HOSPITAL_COMMUNITY): Payer: Self-pay | Admitting: *Deleted

## 2022-05-03 NOTE — Telephone Encounter (Signed)
Reaching out to patient to offer assistance regarding upcoming cardiac imaging study; pt verbalizes understanding of appt date/time, parking situation and where to check in, , and verified current allergies; name and call back number provided for further questions should they arise  Gordy Clement RN Navigator Cardiac [facility] [phone] office [phone] cell   Patient aware to remove [place] monitor. She denies any other metal claustrophobia.

## 2022-05-04 ENCOUNTER — Other Ambulatory Visit: Payer: Self-pay | Admitting: Cardiology

## 2022-05-04 ENCOUNTER — Ambulatory Visit (HOSPITAL_COMMUNITY)
Admission: RE | Admit: 2022-05-04 | Discharge: 2022-05-04 | Disposition: A | Discharge status: Home / Self Care | Payer: Managed Care, Other (non HMO) | Source: Ambulatory Visit | Attending: Cardiology | Admitting: Cardiology

## 2022-05-04 DIAGNOSIS — R0989 Other specified symptoms and signs involving the circulatory and respiratory systems: Secondary | ICD-10-CM

## 2022-05-04 DIAGNOSIS — I428 Other cardiomyopathies: Secondary | ICD-10-CM | POA: Insufficient documentation

## 2022-05-04 MED ORDER — GADOBUTROL 1 MMOL/ML IV SOLN
14.0000 mL | Freq: Once | INTRAVENOUS | Status: AC | PRN
  Administered 2022-05-04: 14 mL via INTRAVENOUS

## 2022-05-19 ENCOUNTER — Other Ambulatory Visit: Payer: Self-pay | Admitting: Cardiology

## 2022-05-19 NOTE — Telephone Encounter (Signed)
Rx refill sent to pharmacy. 

## 2022-06-26 ENCOUNTER — Other Ambulatory Visit: Payer: Self-pay | Admitting: Family Medicine

## 2022-06-26 DIAGNOSIS — E118 Type 2 diabetes mellitus with unspecified complications: Secondary | ICD-10-CM

## 2022-07-01 ENCOUNTER — Other Ambulatory Visit: Payer: Self-pay | Admitting: Family Medicine

## 2022-07-01 DIAGNOSIS — E118 Type 2 diabetes mellitus with unspecified complications: Secondary | ICD-10-CM

## 2022-07-23 NOTE — Progress Notes (Unsigned)
No chief complaint on file.  Patient presents for 4 month follow-up on chronic problems.  Diabetes:  Patient reports compliance with metformin and jardiance, and denies side effects. Occasional vaginal itching, not persistent, no discharge, no urinary complaints.   Last A1c was up to 7.0% in 02/2022 (had been 6.4% in 08/2021.) She reported at that visit that she had been eating more desserts for the last 5 months, and not using the [facility] monitor as regularly due to tight finances since her separation.  Her fasting sugar in 03/2022 was 177. Today she reports  Sugars are running  Denies hypoglycemia. No polydipsia, polyuria. She checks her feet regularly and denies lesions/burning/concerns. She had diabetic eye exam in 01/2022, no retinopathy.  Nonischemic cardiomyopathy, under the care of Dr. [name].  She continues on Entresto, Aldactone, Jardiance and Coreg. Cardiac MRI was done in December: IMPRESSION: 1. Normal LV size with mild LV hypertrophy. EF 51%, diffuse hypokinesis. 2.  Normal RV size and systolic function, EF [id number]. 3. On delayed enhancement imaging, there was patchy mid-wall LGE in the basal septum. This could represent prior myocarditis. RV insertion site LGE can be seen with pressure/volume overload. 4.  Normal extracellular volume percentage.  Since undergoing cardiac rehab, she has less DOE, can go to the grocery store and not get short of breath. Does some walking, not as active since rehab stopped.  UPDATE Denies headaches, dizziness, edema. Denies chest pain.   Obstructive sleep apnea: She had sleep study in March 2023, revealing moderate OSA, but severe with supine sleep (pt states she never sleeps on her back, but was told to for the study).  Moderate oxygen desaturation down to 84%. She reported she was going to be referred to a sleep doctor, never heard anything; we went ahead and referred her for CPAP at her last visit/physical.   Mixed hyperlipidemia:  Patient  reports compliance with atorvastatin '10mg'$  and omega 3 fish oil, without side effects. Her fish oil dose was recommended to be increased to 4000 mg daily after her 03/2022 labs, where TG were up to 271 (had been 206 on 3000 mg fish oil dose in 05/2021; sugars were above goal at that time). She was encouraged to cut back on sweets/sugars/fatty/fried foods.  LDL has been at goal. She is due for recheck.  She reports  UPDATE DIET  Lab Results  Component Value Date   CHOL 153 03/22/2022   HDL 40 03/22/2022   LDLCALC 69 03/22/2022   LDLDIRECT 130.8 01/15/2014   TRIG 271 (H) 03/22/2022   CHOLHDL 3.8 03/22/2022   Migraines:  Roselyn Meier remains effective. Migraines are hormonal.  Migraines are usually once a month, for 1-2 days. No aura.   H/o Vitamin D deficiency:  Last check was normal, with a level of 39.3 in 03/2022, when taking 5000 IU daily.  She continues to take 5000 IU daily.   PMH, PSH, SH reviewed   ROS: no fever, chills, headaches (other than hormonal migraines), dizziness, URI symptoms, edema, n/v/d, urinary complaints.   No bleeding, bruising, rash.  Moods are good. No chest pain. No longer has palpitations. DOE improved.   PHYSICAL EXAM:  There were no vitals taken for this visit.  Wt Readings from Last 3 Encounters:  03/16/22 246 lb 6.4 oz (111.8 kg)  02/08/22 248 lb 6.4 oz (112.7 kg)  12/02/21 250 lb 10.6 oz (113.7 kg)   Pleasant female in no distress HEENT: conjunctiva and sclera are clear, EOMI Neck: No lymphadenopathy, thyromegaly or bruit  Heart: regular rate and rhythm, no murmur Lungs: clear, no wheezing, rales, ronchi Back: no spinal or CVA tenderness Abdomen: soft, nontender, no mass Extremities: no edema. 2+ pulses.  WHSS bilateral, though still has some deviation of L great toe.  Small callous noted medially (L foot).  Normal monofilament sensation Neuro: alert and oriented, normal gait Psych: normal mood, affect   ASSESSMENT/PLAN:  Did she get  CPAP?? Using?? I'm guessing not, or we would have been notified about need for FTF for compliance???  A1c Urine microalbumin needed today Lipids (can delay if A1c very high) Consider TSH   Discuss Shingrix--needs to get from pharmacy (medicare)

## 2022-07-23 NOTE — Patient Instructions (Incomplete)
Start Mounjaro. We titrate the dose up every 4 weeks, unless you are having issues tolerating it. Be sure to eat small quantities to limit the potential for nausea and vomiting. Cut out the 100 calorie packs--this has helped you lose weight, but is still high in sugar. Eat more fruit (2 servings/day), vegetables, and a high fiber diet.  Consider diabetes education, to help with food choices and understanding diabetes. Contact us if you would like the referral. Continue all of your other medications.  Hopefully we can find CPAP to be affordable with your new insurance.  Go to the [facility], try and exercise at least 30 minutes daily.  I recommend getting the new shingles vaccine (Shingrix). Since you have Medicare, you will need to get this from the pharmacy, as it is covered by Part D. This is a series of 2 injections, spaced 2 months apart.   This should be separated from other vaccines by at least 2 weeks.

## 2022-07-24 ENCOUNTER — Ambulatory Visit (INDEPENDENT_AMBULATORY_CARE_PROVIDER_SITE_OTHER): Payer: Medicare Other | Admitting: Family Medicine

## 2022-07-24 ENCOUNTER — Encounter: Payer: Self-pay | Admitting: Family Medicine

## 2022-07-24 VITALS — BP 120/68 | HR 84 | Ht 66.0 in | Wt 242.8 lb

## 2022-07-24 DIAGNOSIS — I5042 Chronic combined systolic (congestive) and diastolic (congestive) heart failure: Secondary | ICD-10-CM | POA: Diagnosis not present

## 2022-07-24 DIAGNOSIS — I428 Other cardiomyopathies: Secondary | ICD-10-CM | POA: Diagnosis not present

## 2022-07-24 DIAGNOSIS — E1169 Type 2 diabetes mellitus with other specified complication: Secondary | ICD-10-CM | POA: Diagnosis not present

## 2022-07-24 DIAGNOSIS — G4733 Obstructive sleep apnea (adult) (pediatric): Secondary | ICD-10-CM

## 2022-07-24 DIAGNOSIS — G43829 Menstrual migraine, not intractable, without status migrainosus: Secondary | ICD-10-CM

## 2022-07-24 DIAGNOSIS — E785 Hyperlipidemia, unspecified: Secondary | ICD-10-CM

## 2022-07-24 DIAGNOSIS — Z6839 Body mass index (BMI) 39.0-39.9, adult: Secondary | ICD-10-CM

## 2022-07-24 DIAGNOSIS — E118 Type 2 diabetes mellitus with unspecified complications: Secondary | ICD-10-CM | POA: Diagnosis not present

## 2022-07-24 LAB — POCT GLYCOSYLATED HEMOGLOBIN (HGB A1C): Hemoglobin A1C: 7.4 % — AB (ref 4.0–5.6)

## 2022-07-24 MED ORDER — UBRELVY 50 MG PO TABS: 50.0000 mg | ORAL_TABLET | Freq: Every day | ORAL | 0 refills | Status: DC | PRN

## 2022-07-24 MED ORDER — TIRZEPATIDE 5 MG/0.5ML ~~LOC~~ SOAJ: 5.0000 mg | SUBCUTANEOUS | 0 refills | Status: DC

## 2022-07-24 MED ORDER — UBRELVY 100 MG PO TABS: ORAL_TABLET | ORAL | 0 refills | Status: DC

## 2022-07-24 MED ORDER — TIRZEPATIDE 2.5 MG/0.5ML ~~LOC~~ SOAJ: 2.5000 mg | SUBCUTANEOUS | 0 refills | Status: DC

## 2022-07-25 LAB — MICROALBUMIN / CREATININE URINE RATIO
Creatinine, Urine: 40.7 mg/dL
Microalb/Creat Ratio: <7 mg/g creat (ref 0–29)
Microalbumin, Urine: <3 ug/mL

## 2022-08-14 ENCOUNTER — Ambulatory Visit: Payer: Medicare Other | Admitting: Cardiology

## 2022-08-19 ENCOUNTER — Other Ambulatory Visit: Payer: Self-pay | Admitting: Family Medicine

## 2022-09-05 ENCOUNTER — Ambulatory Visit: Payer: Medicare Other | Admitting: Cardiology

## 2022-09-07 ENCOUNTER — Other Ambulatory Visit: Payer: Self-pay | Admitting: Family Medicine

## 2022-09-07 DIAGNOSIS — E118 Type 2 diabetes mellitus with unspecified complications: Secondary | ICD-10-CM

## 2022-09-08 NOTE — Telephone Encounter (Signed)
Please increase to the 7.5 mg dose x 4 weeks. Pt to let us know how she is doing on that dose prior to her next refill (will need one more refill before her visit in June)

## 2022-09-11 NOTE — Telephone Encounter (Signed)
Refilled this 

## 2022-09-17 ENCOUNTER — Other Ambulatory Visit: Payer: Self-pay | Admitting: Cardiology

## 2022-09-29 ENCOUNTER — Other Ambulatory Visit: Payer: Self-pay | Admitting: Cardiology

## 2022-09-29 ENCOUNTER — Other Ambulatory Visit: Payer: Self-pay | Admitting: Family Medicine

## 2022-09-29 DIAGNOSIS — E118 Type 2 diabetes mellitus with unspecified complications: Secondary | ICD-10-CM

## 2022-09-29 NOTE — Telephone Encounter (Signed)
Refill to pharmacy 

## 2022-10-02 ENCOUNTER — Encounter: Payer: Self-pay | Admitting: *Deleted

## 2022-10-02 NOTE — Progress Notes (Signed)
[facility] Quality Team Note  Name: Carolyn Salazar Date of Birth: May 15, 1971 MRN: [id number] Date: 10/02/2022  [facility] Quality Team has reviewed this patient's chart, please see recommendations below:  [facility] Quality Other; Pt had UACR.  Needs EGFR in order to close gap.  Would it be possible to address at her next ov on 10/25/22.  Thank you for all of your help.

## 2022-10-10 ENCOUNTER — Other Ambulatory Visit: Payer: Self-pay | Admitting: Family Medicine

## 2022-10-10 DIAGNOSIS — E118 Type 2 diabetes mellitus with unspecified complications: Secondary | ICD-10-CM

## 2022-10-10 MED ORDER — TIRZEPATIDE 7.5 MG/0.5ML ~~LOC~~ SOAJ
7.5000 mg | SUBCUTANEOUS | 0 refills | Status: DC
Start: 2022-10-10 — End: 2022-11-01

## 2022-10-10 NOTE — Telephone Encounter (Signed)
Patient advised to stay on 7.5mg . Sent to pharmacy.

## 2022-10-22 ENCOUNTER — Other Ambulatory Visit: Payer: Self-pay | Admitting: Family Medicine

## 2022-10-23 ENCOUNTER — Telehealth: Payer: Self-pay | Admitting: *Deleted

## 2022-10-23 NOTE — Telephone Encounter (Signed)
Received PA on Freestyle Libre 3, faxed order to [facility].

## 2022-10-25 ENCOUNTER — Encounter: Payer: Medicare Other | Admitting: Family Medicine

## 2022-10-30 ENCOUNTER — Encounter: Payer: Medicare Other | Admitting: Family Medicine

## 2022-10-30 ENCOUNTER — Encounter: Payer: Self-pay | Admitting: *Deleted

## 2022-10-31 NOTE — Progress Notes (Unsigned)
No chief complaint on file.   Diabetes:  Greggory Keen was added to her regimen of metformin and Jardiance at her visit in March, due to A1c being above goal. A1c was 7.4%.  She was started on 2.5 mg and was titrated up to 7.5 mg weekly. She is tolerating these medications without side effects.    Sugars are running   She had reported via MyChart message in May that she had some sugars down to 50--realized she was taking metformin too long after eating.  She adjusted timing, and hasn't had further hypoglycemia.  No polydipsia, polyuria. She checks her feet regularly and denies lesions/burning/concerns. She had diabetic eye exam in 01/2022, no retinopathy.   Nonischemic cardiomyopathy, under the care of Dr. Servando Salina.  She continues on Entresto, Aldactone, Jardiance and Coreg. Cardiac MRI was done in December.  DOE improved after cardiac rehab, no longer getting short of breath at the grocery store.  At  her last visit she reported having new insurance, and planned to start Silver Sneakers at [employer], and use the recumbent bike.   UPDATE  Denies headaches, dizziness, edema. Denies chest pain.   Obstructive sleep apnea: She had sleep study in March 2023, revealing moderate OSA, but severe with supine sleep (pt states she never sleeps on her back, but was told to for the study).  Moderate oxygen desaturation down to 84%. She had never started CPAP, was very expensive, was waiting for her new insurance. UPDATE   Mixed hyperlipidemia:  Patient reports compliance with atorvastatin 10mg  and omega 3 fish oil, without side effects. Her fish oil dose was increased to 4000 mg daily after her 03/2022 labs, where TG were up to 271 (had been 206 on 3000 mg fish oil dose in 05/2021; sugars were above goal at that time).   LDL has been at goal. She was encouraged to cut back on sweets/sugars/fatty/fried foods.   Lab Results  Component Value Date   CHOL 153 03/22/2022   HDL 40 03/22/2022   LDLCALC 69 03/22/2022    LDLDIRECT 130.8 01/15/2014   TRIG 271 (H) 03/22/2022   CHOLHDL 3.8 03/22/2022    PMH, PSH, SH reviewed   ROS   PHYSICAL EXAM:  There were no vitals taken for this visit.  Wt Readings from Last 3 Encounters:  07/24/22 242 lb 12.8 oz (110.1 kg)  03/16/22 246 lb 6.4 oz (111.8 kg)  02/08/22 248 lb 6.4 oz (112.7 kg)      ASSESSMENT/PLAN:  A1c Did she ever get started on CPAP?? ?ever had HIV test? Consider adding to labs to close gap  Past due for mammo  ?further titrate mounjaro dose?  Physical/AWV due end of 02/2023

## 2022-11-01 ENCOUNTER — Ambulatory Visit (INDEPENDENT_AMBULATORY_CARE_PROVIDER_SITE_OTHER): Payer: Medicare Other | Admitting: Family Medicine

## 2022-11-01 ENCOUNTER — Encounter: Payer: Self-pay | Admitting: Family Medicine

## 2022-11-01 VITALS — BP 110/62 | HR 92 | Ht 66.0 in | Wt 218.6 lb

## 2022-11-01 DIAGNOSIS — I5042 Chronic combined systolic (congestive) and diastolic (congestive) heart failure: Secondary | ICD-10-CM

## 2022-11-01 DIAGNOSIS — I428 Other cardiomyopathies: Secondary | ICD-10-CM

## 2022-11-01 DIAGNOSIS — E781 Pure hyperglyceridemia: Secondary | ICD-10-CM | POA: Diagnosis not present

## 2022-11-01 DIAGNOSIS — E785 Hyperlipidemia, unspecified: Secondary | ICD-10-CM | POA: Diagnosis not present

## 2022-11-01 DIAGNOSIS — E118 Type 2 diabetes mellitus with unspecified complications: Secondary | ICD-10-CM | POA: Diagnosis not present

## 2022-11-01 DIAGNOSIS — E1169 Type 2 diabetes mellitus with other specified complication: Secondary | ICD-10-CM

## 2022-11-01 DIAGNOSIS — Z6835 Body mass index (BMI) 35.0-35.9, adult: Secondary | ICD-10-CM

## 2022-11-01 DIAGNOSIS — G4733 Obstructive sleep apnea (adult) (pediatric): Secondary | ICD-10-CM

## 2022-11-01 LAB — POCT GLYCOSYLATED HEMOGLOBIN (HGB A1C): Hemoglobin A1C: 5.3 % (ref 4.0–5.6)

## 2022-11-01 MED ORDER — TIRZEPATIDE 7.5 MG/0.5ML ~~LOC~~ SOAJ
7.5000 mg | SUBCUTANEOUS | 3 refills | Status: DC
Start: 2022-11-01 — End: 2023-02-26

## 2022-11-01 NOTE — Patient Instructions (Signed)
Stay on the mounjaro 7.5 mg dose. We will refer you to our pharmacist to see if there is any help or suggestions regarding your medications and the donut hole. Options that I can think of include switching the Mounjaro to a different GLP that has a patient assistance program, seeing if Marcelline Deist has a program (whereas you said the Jardiance didn't).  Try and resume regular exercise once you are moved. Remember to get weight-bearing exercise. We need to ensure that you are getting adequate protein intake into your diet, and adequate weight-bearing exercise so that you don't lose muscle mass.  Please schedule your mammogram.

## 2022-11-02 ENCOUNTER — Encounter: Payer: Self-pay | Admitting: Family Medicine

## 2022-11-02 DIAGNOSIS — G4733 Obstructive sleep apnea (adult) (pediatric): Secondary | ICD-10-CM | POA: Insufficient documentation

## 2022-11-02 LAB — CMP14+EGFR
ALT: 23 IU/L (ref 0–32)
AST: 22 IU/L (ref 0–40)
Albumin/Globulin Ratio: 2
Albumin: 4.3 g/dL (ref 3.8–4.9)
Alkaline Phosphatase: 66 IU/L (ref 44–121)
BUN/Creatinine Ratio: 9 (ref 9–23)
BUN: 8 mg/dL (ref 6–24)
Bilirubin Total: 0.9 mg/dL (ref 0.0–1.2)
CO2: 26 mmol/L (ref 20–29)
Calcium: 9.1 mg/dL (ref 8.7–10.2)
Chloride: 100 mmol/L (ref 96–106)
Creatinine, Ser: 0.88 mg/dL (ref 0.57–1.00)
Globulin, Total: 2.1 g/dL (ref 1.5–4.5)
Glucose: 86 mg/dL (ref 70–99)
Potassium: 3.8 mmol/L (ref 3.5–5.2)
Sodium: 141 mmol/L (ref 134–144)
Total Protein: 6.4 g/dL (ref 6.0–8.5)
eGFR: 80 mL/min/{1.73_m2} (ref >59)

## 2022-11-02 LAB — LIPID PANEL
Chol/HDL Ratio: 3 ratio (ref 0.0–4.4)
Cholesterol, Total: 143 mg/dL (ref 100–199)
HDL: 47 mg/dL (ref >39)
LDL Chol Calc (NIH): 71 mg/dL (ref 0–99)
Triglycerides: 142 mg/dL (ref 0–149)
VLDL Cholesterol Cal: 25 mg/dL (ref 5–40)

## 2022-11-03 ENCOUNTER — Telehealth: Payer: Self-pay | Admitting: *Deleted

## 2022-11-03 NOTE — Progress Notes (Signed)
  Care Coordination  Outreach Note  11/03/2022 Name: Carolyn Salazar MRN: [id number] DOB: 1971-05-13   Care Coordination Outreach Attempts: An unsuccessful telephone outreach was attempted today to offer the patient information about available care coordination services.  Follow Up Plan:  Additional outreach attempts will be made to offer the patient care coordination information and services.   Encounter Outcome:  No Answer  Burman Nieves, CCMA Care Coordination Care Guide Direct Dial: [phone]

## 2022-11-03 NOTE — Progress Notes (Signed)
  Care Coordination   Note   11/03/2022 Name: Edin Hewell MRN: [id number] DOB: [birth year]  Takeila Gowen is a 52 y.o. year old female who sees Joselyn Arrow, MD for primary care. I reached out to [employer] by phone today to offer care coordination services.  Ms. Solar was given information about Care Coordination services today including:   The Care Coordination services include support from the care team which includes your Nurse Coordinator, Clinical Social Worker, or Pharmacist.  The Care Coordination team is here to help remove barriers to the health concerns and goals most important to you. Care Coordination services are voluntary, and the patient may decline or stop services at any time by request to their care team member.   Care Coordination Consent Status: Patient agreed to services and verbal consent obtained.   Follow up plan:  Telephone appointment with care coordination team member scheduled for:  11/13/2022   Encounter Outcome:  Pt. Scheduled from referral   Burman Nieves, [facility] Care Coordination Care Guide Direct Dial: [phone]

## 2022-11-03 NOTE — Telephone Encounter (Signed)
Veronica,  I am unable to see your PA that you did since you are not listed under the [facility] in Covermymeds. If you will look into the reason for denial and see what need else is needed or why it was denied. Adam or laura should be able to help you to see what's next steps are to do.

## 2022-11-13 ENCOUNTER — Ambulatory Visit: Payer: Self-pay

## 2022-11-13 NOTE — Patient Outreach (Signed)
  Care Coordination   11/13/2022 Name: Merina Behrendt MRN: [id number] DOB: Mar 29, 1971   Care Coordination Outreach Attempts:  An unsuccessful telephone outreach was attempted for a scheduled appointment today.  Follow Up Plan:  Additional outreach attempts will be made to offer the patient care coordination information and services.   Encounter Outcome:  No Answer   Care Coordination Interventions:  No, not indicated    Delsa Sale, RN, BSN, CCM Care Management Coordinator [facility] Care Management  Direct Phone: [phone]

## 2022-11-16 ENCOUNTER — Ambulatory Visit: Payer: Self-pay

## 2022-11-16 DIAGNOSIS — E1169 Type 2 diabetes mellitus with other specified complication: Secondary | ICD-10-CM

## 2022-11-17 NOTE — Patient Instructions (Signed)
Visit Information  Thank you for taking time to visit with me today. Please don't hesitate to contact me if I can be of assistance to you.   Following are the goals we discussed today:   Goals Addressed             This Visit's Progress    To get help with cost of Dewayne Shorter and Entresto 1       Care Coordination Interventions: Patient interviewed about adult health maintenance status including  financial ability to pay for medications Determined patient is in the donut hole and is having financial difficulty paying for her Chrisandra Netters and Kasandra Knudsen [id number] Pharmacy referral requesting outreach to patient to provide resources that may help cover and or lower the cost of these medications         To maintain current A1c       Care Coordination Interventions: Provided education to patient about basic DM disease process Reviewed medications with patient and discussed importance of medication adherence Counseled on importance of regular laboratory monitoring as prescribed Review of patient status, including review of consultants reports, relevant laboratory and other test results, and medications completed Counseled on Diabetic diet, my plate method, 454 minutes of moderate intensity exercise/week Lab Results  Component Value Date   HGBA1C 5.3 11/01/2022            Our next appointment is by telephone on 11/30/22 at 2:30 PM  Please call the care guide team at [phone] if you need to cancel or reschedule your appointment.   If you are experiencing a Mental Health or Behavioral Health Crisis or need someone to talk to, please call 1-800-273-TALK (toll free, 24 hour hotline)  Patient verbalizes understanding of instructions and care plan provided today and agrees to view in MyChart. Active MyChart status and patient understanding of how to access instructions and care plan via MyChart confirmed with patient.     Delsa Sale, RN, BSN, CCM Care  Management Coordinator [facility] Care Management  Direct Phone: [phone]

## 2022-11-17 NOTE — Patient Outreach (Signed)
  Care Coordination   Initial Visit Note   11/16/2022 Name: Carolyn Salazar MRN: [id number] DOB: [birth year]  Carolyn Salazar is a 52 y.o. year old female who sees Carolyn Arrow, MD for primary care. I spoke with  Carolyn Salazar by phone today.  What matters to the patients health and wellness today?  Patient needs help with the cost of [place], [place], and Jardiance.     Goals Addressed             This Visit's Progress    To get help with cost of Mounjaro, Jardiance and Entresto 1       Care Coordination Interventions: Patient interviewed about adult health maintenance status including  financial ability to pay for medications Determined patient is in the donut hole and is having financial difficulty paying for her Carolyn Salazar and Carolyn Salazar [id number] Pharmacy referral requesting outreach to patient to provide resources that may help cover and or lower the cost of these medications         To maintain current A1c       Care Coordination Interventions: Provided education to patient about basic DM disease process Reviewed medications with patient and discussed importance of medication adherence Counseled on importance of regular laboratory monitoring as prescribed Review of patient status, including review of consultants reports, relevant laboratory and other test results, and medications completed Counseled on Diabetic diet, my plate method, 147 minutes of moderate intensity exercise/week Lab Results  Component Value Date   HGBA1C 5.3 11/01/2022        Interventions Today    Flowsheet Row Most Recent Value  Chronic Disease   Chronic disease during today's visit Diabetes, Other  [Hyperlipidemia]  General Interventions   General Interventions Discussed/Reviewed General Interventions Discussed, General Interventions Reviewed, Doctor Visits, Labs  Doctor Visits Discussed/Reviewed Doctor Visits Discussed, Doctor Visits Reviewed, PCP  Education Interventions    Education Provided Provided Education  Provided Verbal Education On Labs, Medication, Blood Sugar Monitoring, When to see the doctor  Labs Reviewed Lipid Profile, Hgb A1c  Pharmacy Interventions   Pharmacy Dicussed/Reviewed Pharmacy Topics Discussed, Pharmacy Topics Reviewed, Affording Medications, Referral to Pharmacist  Referral to Pharmacist Cannot afford medications  Carolyn Salazar, Jardiance (pt is in donut hole)]          SDOH assessments and interventions completed:  No     Care Coordination Interventions:  Yes, provided   Follow up plan: Referral made to [id number] to assist with cost of meds Follow up call scheduled for 11/30/22 @2 :30 PM    Encounter Outcome:  Pt. Visit Completed

## 2022-11-29 ENCOUNTER — Other Ambulatory Visit: Payer: Medicare Other

## 2022-11-29 NOTE — Progress Notes (Signed)
   11/29/2022  Patient ID: Carolyn Salazar, female   DOB: Oct 16, 1970, 52 y.o.   MRN: [id number]  S/O Telephone visit to assist patient with affordability of medications in response to referral from PCP, Dr. Lynelle Doctor  Medication Access/Adherence -Patient is prescribed the following medications that she is unable to afford due to being in Medicare coverage gap: Mounjaro 7.5mg  weekly- has been without medication for 1 week Ubrelvy 50mg  1-2 tablets daily as needed for migraine- not on formulary, so patient was never able to start therapy Jardiance 25mg  daily- has a couple weeks left from sample provided by Dr. Delford Field office  Sherryll Burger 24-26mg  BID- patient has 1 month remaining -Discussed resources to help with affordability, including Medicare Extra Help and manufacturer patient assistance plans  A/P  Medication Access/Adherence -Patient would qualify for Medicare Extra Help, so I am sending her the link to apply via MyChart message -She would also qualify for [employer] PAP for Ubrelvy since the medication is non-formulary.  Working with medication assistance team to initiate application for this program -LIS (Medicare Extra Help) approval can take several weeks, so I will see if Dr. Delford Field office has another sample of Jardiance 25mg  or Mounjaro.  If they do not have Mounjaro, patient endorses ability to pay for one more month to prevent going without any longer  Follow-up:  Will inform patient if additional samples are available at PCP office.  I am also providing my direct number in MyChart message, so she can keep me posted on LIS application or contact for further needs.  Lenna Gilford, PharmD, DPLA

## 2022-11-30 ENCOUNTER — Ambulatory Visit: Payer: Self-pay

## 2022-11-30 ENCOUNTER — Telehealth: Payer: Self-pay

## 2022-11-30 NOTE — Progress Notes (Signed)
   11/30/2022  Patient ID: Carolyn Salazar, female   DOB: November 03, 1970, 52 y.o.   MRN: [id number]  Outreach to inform office has jardiance sample but no mounjaro-left HIPAA compliant voicemail for patient to return my call.  Also sending a MyChart message.

## 2022-12-01 ENCOUNTER — Other Ambulatory Visit (HOSPITAL_COMMUNITY): Payer: Self-pay

## 2022-12-01 NOTE — Patient Instructions (Signed)
Visit Information  Thank you for taking time to visit with me today. Please don't hesitate to contact me if I can be of assistance to you.   Following are the goals we discussed today:   Goals Addressed             This Visit's Progress    COMPLETED: To get help with cost of Dewayne Shorter and Entresto 1       Care Coordination Interventions: Patient interviewed about adult health maintenance status including  financial ability to pay for medications Determined patient collaborated with Lenna Gilford, [facility] regarding patient assistance with cost of several medications Medication Access/Adherence -Patient is prescribed the following medications that she is unable to afford due to being in Medicare coverage gap: Mounjaro 7.5mg  weekly- has been without medication for 1 week Ubrelvy 50mg  1-2 tablets daily as needed for migraine- not on formulary, so patient was never able to start therapy Jardiance 25mg  daily- has a couple weeks left from sample provided by Dr. Delford Field office  Sherryll Burger 24-26mg  BID- patient has 1 month remaining -Discussed resources to help with affordability, including Medicare Extra Help and manufacturer patient assistance plans A/P Medication Access/Adherence -Patient would qualify for Medicare Extra Help, so I am sending her the link to apply via MyChart message -She would also qualify for [employer] PAP for Ubrelvy since the medication is non-formulary.  Working with medication assistance team to initiate application for this program -LIS (Medicare Extra Help) approval can take several weeks, so I will see if Dr. Delford Field office has another sample of Jardiance 25mg  or Mounjaro.  If they do not have Mounjaro, patient endorses ability to pay for one more month to prevent going without any longer Follow-up:  Will inform patient if additional samples are available at PCP office.  I am also providing my direct number in MyChart message, so she can keep me posted on LIS  application or contact for further needs. Determined patient verbalizes understanding of the recommendations provided and next steps Determined patient prefers to contact this RN if further assistance is needed in the future        Please call the care guide team at [phone] if you need to schedule a follow up call with this nurse care coordinator.   If you are experiencing a Mental Health or Behavioral Health Crisis or need someone to talk to, please call 1-800-273-TALK (toll free, 24 hour hotline)  Patient verbalizes understanding of instructions and care plan provided today and agrees to view in MyChart. Active MyChart status and patient understanding of how to access instructions and care plan via MyChart confirmed with patient.     Delsa Sale, RN, BSN, CCM Care Management Coordinator [facility] Care Management  Direct Phone: [phone]

## 2022-12-01 NOTE — Patient Outreach (Signed)
  Care Coordination   Follow Up Visit Note   12/01/2022 Name: Carolyn Salazar MRN: [id number] DOB: 1970/10/23  Carolyn Salazar is a 52 y.o. year old female who sees Joselyn Arrow, MD for primary care. I spoke with  Shanon Rosser by phone today.  What matters to the patients health and wellness today?  Patient will work with the pharmacy team for assistance with medication cost.     Goals Addressed             This Visit's Progress    COMPLETED: To get help with cost of Dewayne Shorter and Entresto 1       Care Coordination Interventions: Patient interviewed about adult health maintenance status including  financial ability to pay for medications Determined patient collaborated with Lenna Gilford, [facility] regarding patient assistance with cost of several medications Medication Access/Adherence -Patient is prescribed the following medications that she is unable to afford due to being in Medicare coverage gap: Mounjaro 7.5mg  weekly- has been without medication for 1 week Ubrelvy 50mg  1-2 tablets daily as needed for migraine- not on formulary, so patient was never able to start therapy Jardiance 25mg  daily- has a couple weeks left from sample provided by Dr. Delford Field office  Sherryll Burger 24-26mg  BID- patient has 1 month remaining -Discussed resources to help with affordability, including Medicare Extra Help and manufacturer patient assistance plans A/P Medication Access/Adherence -Patient would qualify for Medicare Extra Help, so I am sending her the link to apply via MyChart message -She would also qualify for [employer] PAP for Ubrelvy since the medication is non-formulary.  Working with medication assistance team to initiate application for this program -LIS (Medicare Extra Help) approval can take several weeks, so I will see if Dr. Delford Field office has another sample of Jardiance 25mg  or Mounjaro.  If they do not have Mounjaro, patient endorses ability to pay for one more month to prevent  going without any longer Follow-up:  Will inform patient if additional samples are available at PCP office.  I am also providing my direct number in MyChart message, so she can keep me posted on LIS application or contact for further needs. Determined patient verbalizes understanding of the recommendations provided and next steps Determined patient prefers to contact this RN if further assistance is needed in the future    Interventions Today    Flowsheet Row Most Recent Value  Chronic Disease   Chronic disease during today's visit Diabetes  General Interventions   General Interventions Discussed/Reviewed General Interventions Discussed, General Interventions Reviewed, Doctor Visits  Education Interventions   Education Provided Provided Education  Provided Verbal Education On Medication  Pharmacy Interventions   Pharmacy Dicussed/Reviewed Pharmacy Topics Discussed, Pharmacy Topics Reviewed, Affording Medications          SDOH assessments and interventions completed:  No     Care Coordination Interventions:  Yes, provided   Follow up plan: No further intervention required.   Encounter Outcome:  Pt. Visit Completed

## 2022-12-05 ENCOUNTER — Telehealth: Payer: Self-pay

## 2022-12-05 NOTE — Telephone Encounter (Signed)
-----   Message from Lenna Gilford sent at 11/29/2022  4:01 PM EDT ----- [name] afternoon,  Patient would qualify for [employer] PAP for her Carolyn Salazar.  Would you all mind to start this process, please?  Thank you!  Lenna Gilford, PharmD, DPLA

## 2022-12-05 NOTE — Telephone Encounter (Signed)
PAP application for (UBRELVY FROM [employer]  has been mailed to pt home.   I will fax PCP pages once I receive pt pages   Melanee Spry CPhT [facility] [phone]  PLEASE BE ADVISED

## 2022-12-15 NOTE — Telephone Encounter (Signed)
Message sent to pt.

## 2022-12-18 ENCOUNTER — Other Ambulatory Visit: Payer: Self-pay | Admitting: Cardiology

## 2022-12-18 NOTE — Telephone Encounter (Signed)
PAP application for (UBRELVY FROM [employer])  has been mailed to pt home. PT DID NOT GET 1ST BECAUSE  PT MOVE I WILL RE MAIL TO NEW ADDRESS   I will fax PCP pages once I receive pt pages.

## 2022-12-22 ENCOUNTER — Telehealth: Payer: Self-pay

## 2022-12-22 NOTE — Telephone Encounter (Signed)
Please be advised.  °

## 2022-12-24 ENCOUNTER — Other Ambulatory Visit: Payer: Self-pay | Admitting: Cardiology

## 2022-12-26 NOTE — Telephone Encounter (Signed)
PAP application for JARDIANCE([employer])  has been mailed to pt home.    I will fax PCP pages once I receive pt pages.   PLEASE BE ADVISED

## 2022-12-26 NOTE — Telephone Encounter (Signed)
PAP application for ENTRESTO([employer]OVARTIS)  has been mailed to pt home.   I will fax PCP pages once I receive pt pages.  PLEASE BE ADVISED

## 2022-12-29 ENCOUNTER — Ambulatory Visit: Payer: Medicare Other | Admitting: Cardiology

## 2023-01-01 ENCOUNTER — Other Ambulatory Visit: Payer: Self-pay | Admitting: Family Medicine

## 2023-01-01 ENCOUNTER — Telehealth: Payer: Self-pay

## 2023-01-01 NOTE — Progress Notes (Unsigned)
   01/01/2023  Patient ID: Carolyn Salazar, female   DOB: [birth year], 52 y.o.   MRN: [id number]  Patient outreach to see if patient has received PAP applications- still has not, call back wed and resend if not rec'd by then

## 2023-01-03 NOTE — Progress Notes (Signed)
   01/03/2023  Patient ID: Carolyn Salazar, female   DOB: Jul 03, 1970, 52 y.o.   MRN: [id number]  Patient outreach to follow-up to see if PAP application were received.  Patient states she has not received these, but was contacted by the medication assistance team; because the applications were returned to them.  Her city and zip code were not correct, so the applications are being re-sent.  I provided my direct number, so patient can notify me if these are not received by the end of next week.    Need to see if patient has applied for LIS through Medicare Extra Help during next conversation.  Lenna Gilford, PharmD, DPLA

## 2023-01-06 ENCOUNTER — Other Ambulatory Visit: Payer: Self-pay | Admitting: Cardiology

## 2023-01-14 ENCOUNTER — Other Ambulatory Visit: Payer: Self-pay | Admitting: Family Medicine

## 2023-01-14 DIAGNOSIS — E118 Type 2 diabetes mellitus with unspecified complications: Secondary | ICD-10-CM

## 2023-01-20 ENCOUNTER — Other Ambulatory Visit: Payer: Self-pay | Admitting: Cardiology

## 2023-01-23 ENCOUNTER — Other Ambulatory Visit: Payer: Self-pay | Admitting: Cardiology

## 2023-01-27 ENCOUNTER — Other Ambulatory Visit: Payer: Self-pay | Admitting: Cardiology

## 2023-01-29 ENCOUNTER — Other Ambulatory Visit: Payer: Self-pay | Admitting: Cardiology

## 2023-02-04 ENCOUNTER — Telehealth: Payer: Self-pay | Admitting: Family Medicine

## 2023-02-04 NOTE — Telephone Encounter (Signed)
P.A. UBRELVY

## 2023-02-05 ENCOUNTER — Telehealth: Payer: Self-pay

## 2023-02-05 MED ORDER — FREESTYLE LIBRE 3 PLUS SENSOR MISC: 1.0000 | 2 refills | Status: DC

## 2023-02-05 NOTE — Telephone Encounter (Signed)
Done

## 2023-02-05 NOTE — Telephone Encounter (Signed)
Drug change request faxed for freestyle libre 3 sensor. Last appt. 11/01/22. Next appt. 03/29/23

## 2023-02-06 ENCOUNTER — Other Ambulatory Visit: Payer: Self-pay | Admitting: Cardiology

## 2023-02-06 NOTE — Telephone Encounter (Signed)
RECEIVED PT PAGES FOR UBRELVY([employer]) AND FAXED PROVIDER PORTION TO OFFICE OF EVE KNAPP MD.   PLEASE BE ADVISED!

## 2023-02-09 NOTE — Telephone Encounter (Signed)
PAP: Application for UBRELVY has been submitted to PAP Companies: [employer], via fax  PLEASE BE ADVISED

## 2023-02-12 ENCOUNTER — Other Ambulatory Visit: Payer: Self-pay | Admitting: Cardiology

## 2023-02-14 ENCOUNTER — Telehealth: Payer: Self-pay

## 2023-02-14 NOTE — Progress Notes (Signed)
02/14/2023  Patient ID: Carolyn Salazar, female   DOB: [birth year], 52 y.o.   MRN: [id number]  Contacted patient to follow up on Jardiance/Entresto PAP. Did not reach, left voicemail to return call.  Sherrill Raring, PharmD Clinical Pharmacist [phone]

## 2023-02-19 NOTE — Telephone Encounter (Signed)
P.A. approved til 05/22/23

## 2023-02-20 NOTE — Telephone Encounter (Signed)
PAP: Patient assistance application for UBRELVY has been approved by PAP Companies: [employer] from 02/19/2023 to 05/21/2024. Medication should be delivered to PAP Delivery: Home For further shipping updates, please contact [employer] ([employer]) at [phone] Pt ID is: NO ID   PLEASE BE ADVISED I HAVE SCANNED LETTER OF APPROVAL IN MEDIA OF CHART.

## 2023-02-25 ENCOUNTER — Other Ambulatory Visit: Payer: Self-pay | Admitting: Family Medicine

## 2023-02-25 DIAGNOSIS — E118 Type 2 diabetes mellitus with unspecified complications: Secondary | ICD-10-CM

## 2023-02-26 ENCOUNTER — Other Ambulatory Visit: Payer: Self-pay | Admitting: Family Medicine

## 2023-02-26 DIAGNOSIS — E118 Type 2 diabetes mellitus with unspecified complications: Secondary | ICD-10-CM

## 2023-02-27 NOTE — Telephone Encounter (Signed)
Hey! Following up, did the applications come across via email yet?

## 2023-03-06 NOTE — Telephone Encounter (Signed)
RECEIVED PT PAGES AND FAXING PROVIDER PAGES TO OFFICE OF KARDIE, TOBB FOR ENTRESTO (NOVARTIS)  PLEASE BE ADVISED

## 2023-03-06 NOTE — Telephone Encounter (Signed)
RECEIVED PT PAGES AND FAXING PROVIDER PAGES TO OFFICE OF EVE, KNAPP for JARDIANCE ([employer] ([employer]).  PLEASE BE ADVISED

## 2023-03-08 ENCOUNTER — Encounter: Payer: Self-pay | Admitting: Cardiology

## 2023-03-08 ENCOUNTER — Ambulatory Visit: Payer: Medicare Other | Attending: Cardiology | Admitting: Cardiology

## 2023-03-08 VITALS — BP 88/65 | HR 103 | Ht 66.0 in | Wt 195.4 lb

## 2023-03-08 DIAGNOSIS — Z79899 Other long term (current) drug therapy: Secondary | ICD-10-CM | POA: Diagnosis not present

## 2023-03-08 DIAGNOSIS — I428 Other cardiomyopathies: Secondary | ICD-10-CM | POA: Diagnosis not present

## 2023-03-08 NOTE — Patient Instructions (Signed)
Medication Instructions:  DECREASE furosemide (lasix) to Tuesday, Thursday, Saturday  *If you need a refill on your cardiac medications before your next appointment, please call your pharmacy*   Lab Work: CMET and Magnesium TODAY   If you have labs (blood work) drawn today and your tests are completely normal, you will receive your results only by: MyChart Message (if you have MyChart) OR A paper copy in the mail If you have any lab test that is abnormal or we need to change your treatment, we will call you to review the results.   Testing/Procedures: Your physician has requested that you have an echocardiogram. Echocardiography is a painless test that uses sound waves to create images of your heart. It provides your doctor with information about the size and shape of your heart and how well your heart's chambers and valves are working. This procedure takes approximately one hour. There are no restrictions for this procedure. Please do NOT wear cologne, perfume, aftershave, or lotions (deodorant is allowed). Please arrive 15 minutes prior to your appointment time. DUE JANUARY 2025   Follow-Up: At [facility], you and your health needs are our priority.  As part of our continuing mission to provide you with exceptional heart care, we have created designated Provider Care Teams.  These Care Teams include your primary Cardiologist (physician) and Advanced Practice Providers (APPs -  Physician Assistants and Nurse Practitioners) who all work together to provide you with the care you need, when you need it.  We recommend signing up for the patient portal called "MyChart".  Sign up information is provided on this After Visit Summary.  MyChart is used to connect with patients for Virtual Visits (Telemedicine).  Patients are able to view lab/test results, encounter notes, upcoming appointments, etc.  Non-urgent messages can be sent to your provider as well.   To learn more about what you  can do with MyChart, go to [email or web address].    Your next appointment:    3 months with Dr. Servando Salina -- after echo

## 2023-03-08 NOTE — Telephone Encounter (Signed)
PAP: Application for Jardiance has been submitted to PAP Companies: [employer], via fax   PLEASE BE ADVISED
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
## 2023-03-09 ENCOUNTER — Encounter: Payer: Self-pay | Admitting: Cardiology

## 2023-03-09 LAB — COMPREHENSIVE METABOLIC PANEL
ALT: 23 [IU]/L (ref 0–32)
AST: 22 [IU]/L (ref 0–40)
Albumin: 4.2 g/dL (ref 3.8–4.9)
Alkaline Phosphatase: 89 [IU]/L (ref 44–121)
BUN/Creatinine Ratio: 13 (ref 9–23)
BUN: 11 mg/dL (ref 6–24)
Bilirubin Total: 0.7 mg/dL (ref 0.0–1.2)
CO2: 23 mmol/L (ref 20–29)
Calcium: 9.7 mg/dL (ref 8.7–10.2)
Chloride: 101 mmol/L (ref 96–106)
Creatinine, Ser: 0.82 mg/dL (ref 0.57–1.00)
Globulin, Total: 2.2 g/dL (ref 1.5–4.5)
Glucose: 100 mg/dL — ABNORMAL HIGH (ref 70–99)
Potassium: 3.8 mmol/L (ref 3.5–5.2)
Sodium: 141 mmol/L (ref 134–144)
Total Protein: 6.4 g/dL (ref 6.0–8.5)
eGFR: 87 mL/min/{1.73_m2} (ref >59)

## 2023-03-09 LAB — MAGNESIUM: Magnesium: 2 mg/dL (ref 1.6–2.3)

## 2023-03-09 NOTE — Telephone Encounter (Signed)
Spoke to patient Dr.Tobb advised to take potassium only on days you take Lasix.

## 2023-03-09 NOTE — Addendum Note (Signed)
Addended by: Neoma Laming on: 03/09/2023 03:51 PM   Modules accepted: Orders

## 2023-03-09 NOTE — Telephone Encounter (Signed)
Spoke to patient she stated Lasix was decreased to 40 mg 3 times a week.She wants to know if she needs to decrease Potassium to just three times a week.Message sent to Dr.Tobb for advice.

## 2023-03-15 ENCOUNTER — Telehealth: Payer: Self-pay | Admitting: Emergency Medicine

## 2023-03-15 NOTE — Telephone Encounter (Signed)
Faxed PAP for [employer]

## 2023-03-23 ENCOUNTER — Other Ambulatory Visit: Payer: Self-pay | Admitting: Cardiology

## 2023-03-23 NOTE — Telephone Encounter (Signed)
PAP: Application for Sherryll Burger has been submitted to PAP Companies: [employer], via fax    PLEASE BE ADVISED

## 2023-03-26 MED ORDER — FUROSEMIDE 40 MG PO TABS: 40.0000 mg | ORAL_TABLET | Freq: Every day | ORAL | 3 refills | Status: DC

## 2023-03-26 NOTE — Addendum Note (Signed)
Addended by: Adriana Simas, Orson Rho L on: 03/26/2023 02:57 PM   Modules accepted: Orders

## 2023-03-27 DIAGNOSIS — Z5181 Encounter for therapeutic drug level monitoring: Secondary | ICD-10-CM | POA: Diagnosis not present

## 2023-03-28 NOTE — Patient Instructions (Incomplete)
HEALTH MAINTENANCE RECOMMENDATIONS:  It is recommended that you get at least 30 minutes of aerobic exercise at least 5 days/week (for weight loss, you may need as much as 60-90 minutes). This can be any activity that gets your heart rate up. This can be divided in 10-15 minute intervals if needed, but try and build up your endurance at least once a week.  Weight bearing exercise is also recommended twice weekly.  Eat a healthy diet with lots of vegetables, fruits and fiber.  "Colorful" foods have a lot of vitamins (ie green vegetables, tomatoes, red peppers, etc).  Limit sweet tea, regular sodas and alcoholic beverages, all of which has a lot of calories and sugar.  Up to 1 alcoholic drink daily may be beneficial for women (unless trying to lose weight, watch sugars).  Drink a lot of water.  Calcium recommendations are 1200-1500 mg daily (1500 mg for postmenopausal women or women without ovaries), and vitamin D 1000 IU daily.  This should be obtained from diet and/or supplements (vitamins), and calcium should not be taken all at once, but in divided doses.  Monthly self breast exams and yearly mammograms for women over the age of 3 is recommended.  Sunscreen of at least SPF 30 should be used on all sun-exposed parts of the skin when outside between the hours of 10 am and 4 pm (not just when at beach or pool, but even with exercise, golf, tennis, and yard work!)  Use a sunscreen that says "broad spectrum" so it covers both UVA and UVB rays, and make sure to reapply every 1-2 hours.  Remember to change the batteries in your smoke detectors when changing your clock times in the spring and fall. Carbon monoxide detectors are recommended for your home.  Use your seat belt every time you are in a car, and please drive safely and not be distracted with cell phones and texting while driving.   Carolyn Salazar , Thank you for taking time to come for your Medicare Wellness Visit. I appreciate your ongoing  commitment to your health goals. Please review the following plan we discussed and let me know if I can assist you in the future.   This is a list of the screening recommended for you and due dates:  Health Maintenance  Topic Date Due   Zoster (Shingles) Vaccine (1 of 2) Never done   Mammogram  07/05/2022   Eye exam for diabetics  02/17/2023   Complete foot exam   03/17/2023   COVID-19 Vaccine (8 - 2023-24 season) 05/01/2023   Hemoglobin A1C  05/03/2023   Yearly kidney health urinalysis for diabetes  07/24/2023   Yearly kidney function blood test for diabetes  03/07/2024   Medicare Annual Wellness Visit  03/28/2024   Pap with HPV screening  04/28/2024   DTaP/Tdap/Td vaccine (3 - Td or Tdap) 03/19/2029   Colon Cancer Screening  02/15/2031   Flu Shot  Completed   Hepatitis C Screening  Completed   HIV Screening  Completed   HPV Vaccine  Aged Out    I recommend getting the new shingles vaccine (Shingrix). Since you have Medicare, you will need to get this from the pharmacy, as it is covered by Part D. This is a series of 2 injections, spaced 2 months apart.   This should be separated from other vaccines by at least 2 weeks.  Please schedule your yearly diabetic eye exam.  Please schedule routine GYN exam. Please schedule your mammogram (last was  in 2022)

## 2023-03-28 NOTE — Progress Notes (Signed)
Chief Complaint  Patient presents with   Medicare Wellness    Nonfasting AWV/CPE no pap. Needs to schedule with GYN, she overdue. Has not had DM eye exam, she will schedule. Also will schedule mammogram. No change in vit d supplement. Did not CPAP as she had discussed with you about waiting until she had lost more weight and possibly doing another sleep study. Reminded to get Shingrix from pharmacy.  Has had sinus drainage x 2 weeks and right ear pain.    Carolyn Salazar is a 52 y.o. female who presents for a complete physical and 6 month follow-up on chronic problems.  She had change of insurance this year, now on Medicare, so is Welcome to Medicare physical  Sinus drainage and R ear pain started a couple of weeks ago. She tried Sudafed twice daily without any improvement. +runny nose, sinus congestion just on the right side.  Slight R sided sore throat. No cough. Some pressure in the R cheek.  Mucus is mostly clear. She takes zyrtec daily, chronically.   Diabetes:  Greggory Keen was added to her regimen of metformin and Jardiance in March 2024, due to A1c being above goal. A1c was 7.4%.  At her visit in June, she was taking dose of 7.5mg  weekly, and A1c had improved to 5.3%. She continues on 7.5mg  dose. She is tolerating these medications without side effects.  Her appetite is decreased, eating smaller quantities. No nausea or vomiting.  +weight loss. +constipation, managed by high fiber diet.  Sugars are running 75-125 throughout the day.  No hypoglycemia, polydipsia, polyuria. She checks her feet regularly and denies lesions/burning/concerns. She had diabetic eye exam in 01/2022, no retinopathy.  Does not yet have appt scheduled.    Nonischemic cardiomyopathy, under the care of Dr. Servando Salina.  She last saw her a month ago, and was having some dizziness and hypotension. Her lasix was decreased to every other day (T/Th/Sat) to help improve BP.  She also continues on Entresto, Aldactone, Jardiance and  Coreg. No further dizziness,  BP is still low-normal, running 90/60 at home.  No edema. No headaches (other than menstrual migraines), chest pain, DOE. Cardiac MRI was done in December, and EF was improved, normal range.   She is to have a f/u echocardiogram in 05/2023 to assess EF.    Obstructive sleep apnea: She had sleep study in March 2023, revealing moderate OSA, but severe with supine sleep (pt states she never sleeps on her back, but was told to for the study).  Moderate oxygen desaturation down to 84%. She had never started CPAP, was very expensive, was waiting for her new insurance. Elected not to pursue that, and consider repeat sleep study after weight loss, since this might improve. Denies unrefreshed sleep. Daytime somnolence has improved. Occasionally takes a nap.     Mixed hyperlipidemia:  Patient reports compliance with atorvastatin 10mg  and  1 capsule of omega 3 fish oil daily, without side effects.  TG were finally in the normal range on this regimen (and her diet). She continues on same regimen, and tries to follow lowfat, low cholesterol diet. Lab Results  Component Value Date   CHOL 143 11/01/2022   HDL 47 11/01/2022   LDLCALC 71 11/01/2022   LDLDIRECT 130.8 01/15/2014   TRIG 142 11/01/2022   CHOLHDL 3.0 11/01/2022    Migraines:  Bernita Raisin is effective for her. Migraines are hormonal.  Migraines are usually once a month, for 1-2 days. No aura.  She has Mirena--no cycles, but still  gets hormonal changes/breast tenderness. She got Bernita Raisin recently through PAP.   Vitamin D deficiency: Last level was 39.3 in 03/2022, when taking 5000 IU daily. She continues to take 5000 IU daily.  She takes Nexium OTC 20 mg daily, for many years, for reflux.  H/o ulcer as a child. Tried to stop a couple of years ago, had recurrent symptoms. Has not missed any doses to know if she has had any recurrent symptoms recently (s/p significant weight loss).   Immunization History  Administered  Date(s) Administered   Influenza Whole 04/10/2007, 01/31/2011   Influenza,inj,Quad PF,6+ Mos 01/15/2014, 02/28/2016, 03/20/2018, 03/20/2019, 01/27/2020, 03/07/2021, 03/16/2022   Influenza-Unspecified 02/20/2012, 03/06/2023   PFIZER Comirnaty(Gray Top)Covid-19 Tri-Sucrose Vaccine 12/01/2020   PFIZER(Purple Top)SARS-COV-2 Vaccination 08/14/2019, 09/04/2019, 02/16/2020   Pfizer Covid-19 Vaccine Bivalent Booster 42yrs & up 03/07/2021   Pfizer(Comirnaty)Fall Seasonal Vaccine 12 years and older 03/16/2022, 03/06/2023   Pneumococcal Polysaccharide-23 05/23/1999, 03/20/2019   Td 05/22/2008   Tdap 03/20/2019   Last Pap smear: 04/2019--ASCUS/HPV HR negative. Last GYN visit was 03/2021 (pap not repeated, though prev rec from 04/2019 was for pap 1 year) Has Mirena IUD (placed 05/2019 for menorrhagia) Last mammogram: 06/2020 Last colonoscopy: 01/2021 Dr. Lavon Paganini; ulceration noted. 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 Last DEXA: never Dentist: once a year Ophtho: yearly, last in September 2023 Exercise:  seated stepper machine (using arms as well) at the gym 1-2x/week x 30 minutes.   No weight-bearing exercise   Patient Care Team: Joselyn Arrow, MD as PCP - General (Family Medicine) Thomasene Ripple, DO as PCP - Cardiology (Cardiology) Luis Abed, MD (Cardiology) Alm Bustard, MD as Consulting Physician (Gynecology) Deterding, Fayrene Fearing, MD (Nephrology) Beatrice Lecher, PA-C (Cardiology) Ok Edwards, MD (Inactive) (Obstetrics and Gynecology) Psych: Ellis Savage Ophtho: Dr. Carlynn Purl at [facility] Dentist: (previously Dr. Arbutus Ped, can't recall name)    Depression Screening: Flowsheet Row Office Visit from 03/29/2023 in [facility]  PHQ-2 Total Score 0         Falls screen:     03/29/2023    1:50 PM 11/01/2022    2:55 PM  03/16/2022    1:46 PM 10/06/2021    1:39 PM 09/07/2021    3:28 PM  Fall Risk   Falls in the past year? 0 0 0 0 0  Number falls in past yr: 0 0 0 0 0  Injury with Fall? 0 0 0 0 0  Risk for fall due to : No Fall Risks No Fall Risks No Fall Risks Other (Comment) No Fall Risks  Risk for fall due to: Comment    SOB   Follow up Falls evaluation completed Falls evaluation completed Falls evaluation completed Falls evaluation completed Falls evaluation completed     Functional Status Survey: Is the patient deaf or have difficulty hearing?: No Does the patient have difficulty seeing, even when wearing glasses/contacts?: No Does the patient have difficulty concentrating, remembering, or making decisions?: No Does the patient have difficulty walking or climbing stairs?: No Does the patient have difficulty dressing or bathing?: No Does the patient have difficulty doing errands alone such as visiting a doctor's office or shopping?: No  Mini-Cog Scoring: 5   Does not have a living will or healthcare power of attorney.  PMH, PSH, SH reviewed  Outpatient Encounter Medications as of 03/29/2023  Medication Sig Note   ARIPiprazole (ABILIFY) 15 MG tablet Take  15 mg by mouth daily.    atorvastatin (LIPITOR) 10 MG tablet TAKE 1 TABLET BY MOUTH EVERY DAY    b complex vitamins tablet Take 1 tablet by mouth daily.    carvedilol (COREG) 25 MG tablet Take 1 tablet (25 mg total) by mouth 2 (two) times daily with a meal.    cetirizine (ZYRTEC) 10 MG tablet Take 10 mg by mouth daily.      Cholecalciferol (DIALYVITE VITAMIN D 5000) 125 MCG (5000 UT) capsule Take 5,000 Units by mouth daily.    Collagen-Boron-Hyaluronic Acid (MOVE FREE ULTRA JOINT HEALTH) 40-5-3.3 MG TABS Take 1 tablet by mouth daily.    Continuous Glucose Sensor (FREESTYLE LIBRE 3 PLUS SENSOR) MISC 1 each by Does not apply route every 14 (fourteen) days. Change sensor every 15 days    ENTRESTO 24-26 MG TAKE 1 TABLET BY MOUTH TWICE DAILY     esomeprazole (NEXIUM) 20 MG capsule Take 20 mg by mouth daily.    Eszopiclone 3 MG TABS Take 3 mg by mouth at bedtime. 10/05/2021: Lunesta    FLUoxetine (PROZAC) 40 MG capsule Take 80 mg by mouth daily.    furosemide (LASIX) 40 MG tablet Take 1 tablet (40 mg total) by mouth daily. 03/29/2023: Tues, Thurs and Sat   JARDIANCE 25 MG TABS tablet TAKE 1 TABLET BY MOUTH DAILY    lamoTRIgine (LAMICTAL) 200 MG tablet Take 400 mg by mouth daily.    metFORMIN (GLUCOPHAGE) 1000 MG tablet TAKE 1 TABLET BY MOUTH TWICE DAILY WITH FOOD    Omega-3 Fatty Acids (FISH OIL) 1000 MG CAPS Take 1,000 mg by mouth daily.    potassium chloride SA (KLOR-CON M20) 20 MEQ tablet Take 20 meq three times a week on days you take Lasix    Probiotic Product (PROBIOTIC PO) Take 1 capsule by mouth daily.    spironolactone (ALDACTONE) 25 MG tablet TAKE 1/2 TABLET BY MOUTH DAILY    tirzepatide (MOUNJARO) 7.5 MG/0.5ML Pen ADMINISTER 7.5 MG UNDER THE SKIN 1 TIME A WEEK 03/29/2023: Takes on Monday   aspirin-acetaminophen-caffeine (EXCEDRIN MIGRAINE) 250-250-65 MG tablet Take 3 tablets by mouth daily as needed for headache. (Patient not taking: Reported on 03/29/2023) 03/29/2023: As needed   diclofenac Sodium (VOLTAREN) 1 % GEL Apply 1 application  topically daily as needed (pain). (Patient not taking: Reported on 03/29/2023) 03/29/2023: As needed   olopatadine (PATANOL) 0.1 % ophthalmic solution Place 1 drop into both eyes 2 (two) times daily as needed for allergies. (Patient not taking: Reported on 03/29/2023) 03/29/2023: As needed   Ubrogepant (UBRELVY) 50 MG TABS Take 1-2 tablets (50-100 mg total) by mouth daily as needed (migraine). (Patient not taking: Reported on 03/29/2023) 03/29/2023: As needed   Facility-Administered Encounter Medications as of 03/29/2023  Medication   levonorgestrel (MIRENA) 20 MCG/24HR IUD   Allergies  Allergen Reactions   Sulfonamide Derivatives Swelling     ROS:  Denies fever, headaches,  vision changes,  decreased hearing, sore throat, breast concerns, chest pain, palpitations, dizziness, syncope, dyspnea on exertion, cough, swelling, nausea, vomiting, diarrhea, constipation, abdominal pain, melena, hematochezia, indigestion/heartburn, hematuria, incontinence, dysuria, vaginal discharge, odor or itch, genital lesions, joint pains, numbness, tingling, weakness, tremor, suspicious skin lesions, abnormal bleeding/bruising, or enlarged lymph nodes.  Moods are good, recently saw psych. Hair loss since June (3 mos after stress of needing to move), starting to stabilize Intentional weight loss. Constipation managed with diet. No further dizziness.  Some fatigue. Congestion and R ear pain as per HPI   PHYSICAL  EXAM:  BP (!) 90/58   Pulse 60   Temp (!) 97.1 F (36.2 C) (Tympanic)   Ht 5\' 6"  (1.676 m)   Wt 191 lb 6.4 oz (86.8 kg)   BMI 30.89 kg/m   Pulse 108  Wt Readings from Last 3 Encounters:  03/29/23 191 lb 6.4 oz (86.8 kg)  03/08/23 195 lb 6.4 oz (88.6 kg)  11/01/22 218 lb 9.6 oz (99.2 kg)   General Appearance:    Alert, cooperative, no distress, appears stated age  Head:    Normocephalic, without obvious abnormality, atraumatic  Eyes:    PERRL, conjunctiva/corneas clear, EOM's intact  Ears:    Normal TM and external ear canal on L.  R TM obscured by cerumen  Nose:   Nasal mucosa with moderate edema, L>R, no erythema or purulence. Pale mucosa. Mildly tender sinuses x 4  Throat:   Normal mucosa  Neck:   Supple, no lymphadenopathy;  thyroid:  no enlargement/ tenderness/nodules; no JVD  Back:    Spine nontender, no curvature, ROM normal  Lungs:     Clear to auscultation bilaterally without wheezes, rales or ronchi; respirations unlabored  Chest Wall:    No tenderness or deformity   Heart:    tachycardic, rate of 108.  Regular rhythm, S1 and S2 normal, no murmur, rub or gallop  Breast Exam:    deferred to OB/GYN  Abdomen:     Soft, non-tender, nondistended, normoactive bowel sounds, no  masses, no hepatosplenomegaly  Genitalia:    deferred to OB/GYN       Extremities:   No clubbing, cyanosis or edema  Pulses:   2+ and symmetric all extremities  Skin:   Skin color, texture, turgor normal, no rashes or lesions  Lymph nodes:   Cervical, supraclavicular, and axillary nodes normal  Neurologic:   CNII-XII intact, normal strength, sensation and gait                                Psych:   Normal mood, affect, hygiene and grooming.                 Diabetic foot exam--WHSS on both great toes (bunionectomies).  Normal monofilament exam  Recent labs reviewed-- Mg and c-met in 02/2023 normal, glu 100.   Lab Results  Component Value Date   HGBA1C 4.6 03/29/2023     ASSESSMENT/PLAN:  Annual physical exam - Plan: TSH, CBC with Differential/Platelet  Medicare annual wellness visit, initial  Type 2 diabetes mellitus with complication, without long-term current use of insulin (HCC) - normal A1c, significant wt loss on Mounjaro. Cont 7.5mg  dose and jardiance.  Trial off metformin (if sugars rise, start 500mg  ER daily) - Plan: Continuous Glucose Sensor (FREESTYLE LIBRE 3 PLUS SENSOR) MISC, HgB A1c, TSH  Hyperlipidemia associated with type 2 diabetes mellitus (HCC) - lipids at goal on current regimen, continue statin  Hypertriglyceridemia - normal on current regimen. Cont statin and fish oil, along with lowfat diet  Chronic combined systolic and diastolic CHF (congestive heart failure) (HCC)  Mixed hyperlipidemia  NASH (nonalcoholic steatohepatitis) - LFTs normal.  Losing wt on Mounjaro and lipids now controlled  Nonischemic cardiomyopathy (HCC) - EF normal on recent MRI. planning echo soon.  Doing well on current regimen per cardiology, other than low BPs (asympt now)  Obstructive sleep apnea - expecting this to improve with weight loss.  Symptoms already improved. Consider repeat sleep study in future  Menstrual migraine without status migrainosus, not intractable - Bernita Raisin  is effective prn  Vitamin D deficiency - cont daily supplement  Seasonal allergic rhinitis, unspecified trigger - cont antihistamine; add inhaled steroid.  Sinus rinses and mucinex prn. If R ear pain doesn't improve, to return for ear lavage - Plan: fluticasone (FLONASE) 50 MCG/ACT nasal spray  Medication monitoring encounter - Plan: CBC with Differential/Platelet  Flonase for allergie Trial off metformin. To contact us if sugars rise after stopping.  Would change metformin to ER, and start with lower dose, if needed at all. Reminded to schedule GYN exam, mammogram and diabetic eye exam.   Discussed monthly self breast exams and yearly mammograms (past due, reminded to schedule); at least 30 minutes of aerobic activity at least 5 days/week, weight-bearing exercise at least 2x/week; proper sunscreen use reviewed; healthy diet, including goals of calcium and vitamin D intake and alcohol recommendations (less than or equal to 1 drink/day) reviewed; regular seatbelt use; changing batteries in smoke detectors.  Immunization recommendations discussed--continue yearly flu shots. Shingrix recommended, to get from pharmacy. Colonoscopy recommendations reviewed, UTD. Due to f/u with GYN.  We discussed living will and healthcare POA.  Forms given.    Med check in 4-5 mos

## 2023-03-29 ENCOUNTER — Encounter: Payer: Self-pay | Admitting: Family Medicine

## 2023-03-29 ENCOUNTER — Ambulatory Visit (INDEPENDENT_AMBULATORY_CARE_PROVIDER_SITE_OTHER): Payer: Medicare Other | Admitting: Family Medicine

## 2023-03-29 VITALS — BP 90/58 | HR 60 | Temp 97.1°F | Ht 66.0 in | Wt 191.4 lb

## 2023-03-29 DIAGNOSIS — I5042 Chronic combined systolic (congestive) and diastolic (congestive) heart failure: Secondary | ICD-10-CM | POA: Diagnosis not present

## 2023-03-29 DIAGNOSIS — E781 Pure hyperglyceridemia: Secondary | ICD-10-CM | POA: Diagnosis not present

## 2023-03-29 DIAGNOSIS — E118 Type 2 diabetes mellitus with unspecified complications: Secondary | ICD-10-CM

## 2023-03-29 DIAGNOSIS — E1169 Type 2 diabetes mellitus with other specified complication: Secondary | ICD-10-CM | POA: Diagnosis not present

## 2023-03-29 DIAGNOSIS — K7581 Nonalcoholic steatohepatitis (NASH): Secondary | ICD-10-CM

## 2023-03-29 DIAGNOSIS — I428 Other cardiomyopathies: Secondary | ICD-10-CM | POA: Diagnosis not present

## 2023-03-29 DIAGNOSIS — Z0000 Encounter for general adult medical examination without abnormal findings: Secondary | ICD-10-CM

## 2023-03-29 DIAGNOSIS — J302 Other seasonal allergic rhinitis: Secondary | ICD-10-CM

## 2023-03-29 DIAGNOSIS — G43829 Menstrual migraine, not intractable, without status migrainosus: Secondary | ICD-10-CM

## 2023-03-29 DIAGNOSIS — G4733 Obstructive sleep apnea (adult) (pediatric): Secondary | ICD-10-CM | POA: Diagnosis not present

## 2023-03-29 DIAGNOSIS — E785 Hyperlipidemia, unspecified: Secondary | ICD-10-CM

## 2023-03-29 DIAGNOSIS — Z5181 Encounter for therapeutic drug level monitoring: Secondary | ICD-10-CM | POA: Diagnosis not present

## 2023-03-29 DIAGNOSIS — E782 Mixed hyperlipidemia: Secondary | ICD-10-CM

## 2023-03-29 DIAGNOSIS — E559 Vitamin D deficiency, unspecified: Secondary | ICD-10-CM | POA: Diagnosis not present

## 2023-03-29 LAB — POCT GLYCOSYLATED HEMOGLOBIN (HGB A1C): Hemoglobin A1C: 4.6 % (ref 4.0–5.6)

## 2023-03-29 MED ORDER — FREESTYLE LIBRE 3 PLUS SENSOR MISC: 1.0000 | 1 refills | Status: DC

## 2023-03-29 MED ORDER — FLUTICASONE PROPIONATE 50 MCG/ACT NA SUSP
2.0000 | Freq: Every day | NASAL | 6 refills | Status: AC
Start: 2023-03-29 — End: ?

## 2023-03-30 ENCOUNTER — Encounter: Payer: Self-pay | Admitting: Family Medicine

## 2023-03-30 LAB — CBC WITH DIFFERENTIAL/PLATELET
Basophils Absolute: 0 10*3/uL (ref 0.0–0.2)
Basos: 0 %
EOS (ABSOLUTE): 0.3 10*3/uL (ref 0.0–0.4)
Eos: 3 %
Hematocrit: 47.3 % — ABNORMAL HIGH (ref 34.0–46.6)
Hemoglobin: 15.4 g/dL (ref 11.1–15.9)
Immature Grans (Abs): 0 10*3/uL (ref 0.0–0.1)
Immature Granulocytes: 0 %
Lymphocytes Absolute: 3.3 10*3/uL — ABNORMAL HIGH (ref 0.7–3.1)
Lymphs: 32 %
MCH: 29.3 pg (ref 26.6–33.0)
MCHC: 32.6 g/dL (ref 31.5–35.7)
MCV: 90 fL (ref 79–97)
Monocytes Absolute: 0.7 10*3/uL (ref 0.1–0.9)
Monocytes: 6 %
Neutrophils Absolute: 6 10*3/uL (ref 1.4–7.0)
Neutrophils: 59 %
Platelets: 418 10*3/uL (ref 150–450)
RBC: 5.26 x10E6/uL (ref 3.77–5.28)
RDW: 14.1 % (ref 11.7–15.4)
WBC: 10.3 10*3/uL (ref 3.4–10.8)

## 2023-03-30 LAB — TSH: TSH: 2.62 u[IU]/mL (ref 0.450–4.500)

## 2023-04-01 ENCOUNTER — Other Ambulatory Visit: Payer: Self-pay | Admitting: Family Medicine

## 2023-04-01 DIAGNOSIS — E118 Type 2 diabetes mellitus with unspecified complications: Secondary | ICD-10-CM

## 2023-04-02 MED ORDER — MOUNJARO 7.5 MG/0.5ML ~~LOC~~ SOAJ: 7.5000 mg | SUBCUTANEOUS | 2 refills | Status: DC

## 2023-04-03 NOTE — Telephone Encounter (Signed)
Received a fax from  [facility] regarding an approval for  [facility]  patient assistance [employer] 03/25/2024.

## 2023-04-04 ENCOUNTER — Other Ambulatory Visit: Payer: Self-pay | Admitting: *Deleted

## 2023-04-04 DIAGNOSIS — E118 Type 2 diabetes mellitus with unspecified complications: Secondary | ICD-10-CM

## 2023-04-04 MED ORDER — FREESTYLE LIBRE 3 PLUS SENSOR MISC: 1.0000 | 2 refills | Status: DC

## 2023-04-21 ENCOUNTER — Other Ambulatory Visit: Payer: Self-pay | Admitting: Cardiology

## 2023-05-07 ENCOUNTER — Other Ambulatory Visit (HOSPITAL_COMMUNITY): Payer: Self-pay

## 2023-05-07 ENCOUNTER — Telehealth: Payer: Self-pay

## 2023-05-07 NOTE — Telephone Encounter (Signed)
error 

## 2023-05-08 ENCOUNTER — Telehealth: Payer: Self-pay

## 2023-05-08 ENCOUNTER — Other Ambulatory Visit (HOSPITAL_COMMUNITY): Payer: Self-pay

## 2023-05-08 NOTE — Telephone Encounter (Signed)
Pharmacy Patient Advocate Encounter   Received notification from CoverMyMeds that prior authorization for [facility] 3 plus Sensor is required/requested.   Insurance verification completed.   The patient is insured through [facility] .   Per test claim: PA required; PA submitted to above mentioned insurance via CoverMyMeds Key/confirmation #/EOC Key: [id number]     Status is pending

## 2023-05-17 ENCOUNTER — Other Ambulatory Visit: Payer: Self-pay | Admitting: Cardiology

## 2023-05-17 NOTE — Telephone Encounter (Signed)
Pharmacy Patient Advocate Encounter  Received notification from [facility] that Prior Authorization for Freestyle lIbre 3 Sensor  has been CANCELLED due to    No Prior Authorization is needed at this time    PA #/Case ID/Reference #: Key: [id number]

## 2023-05-21 ENCOUNTER — Other Ambulatory Visit: Payer: Self-pay | Admitting: Family Medicine

## 2023-05-21 DIAGNOSIS — E118 Type 2 diabetes mellitus with unspecified complications: Secondary | ICD-10-CM

## 2023-05-29 ENCOUNTER — Other Ambulatory Visit: Payer: Self-pay | Admitting: Cardiology

## 2023-05-31 ENCOUNTER — Ambulatory Visit (HOSPITAL_COMMUNITY): Payer: Medicare Other | Attending: Cardiology

## 2023-05-31 DIAGNOSIS — I428 Other cardiomyopathies: Secondary | ICD-10-CM | POA: Insufficient documentation

## 2023-05-31 LAB — ECHOCARDIOGRAM COMPLETE
Area-P 1/2: 6.96 cm2
S' Lateral: 3.7 cm

## 2023-06-17 ENCOUNTER — Other Ambulatory Visit: Payer: Self-pay | Admitting: Family Medicine

## 2023-06-17 DIAGNOSIS — E118 Type 2 diabetes mellitus with unspecified complications: Secondary | ICD-10-CM

## 2023-06-22 ENCOUNTER — Encounter: Payer: Self-pay | Admitting: Cardiology

## 2023-06-22 ENCOUNTER — Ambulatory Visit: Payer: Medicare Other | Attending: Cardiology | Admitting: Cardiology

## 2023-06-22 VITALS — BP 89/62 | HR 95 | Ht 66.0 in | Wt 194.8 lb

## 2023-06-22 DIAGNOSIS — I428 Other cardiomyopathies: Secondary | ICD-10-CM

## 2023-06-22 DIAGNOSIS — E118 Type 2 diabetes mellitus with unspecified complications: Secondary | ICD-10-CM | POA: Diagnosis not present

## 2023-06-22 DIAGNOSIS — K7581 Nonalcoholic steatohepatitis (NASH): Secondary | ICD-10-CM | POA: Diagnosis not present

## 2023-06-22 DIAGNOSIS — E782 Mixed hyperlipidemia: Secondary | ICD-10-CM

## 2023-06-22 DIAGNOSIS — Z79899 Other long term (current) drug therapy: Secondary | ICD-10-CM | POA: Diagnosis not present

## 2023-06-22 DIAGNOSIS — I5042 Chronic combined systolic (congestive) and diastolic (congestive) heart failure: Secondary | ICD-10-CM

## 2023-06-22 DIAGNOSIS — G4733 Obstructive sleep apnea (adult) (pediatric): Secondary | ICD-10-CM

## 2023-06-22 MED ORDER — CARVEDILOL 12.5 MG PO TABS: 12.5000 mg | ORAL_TABLET | Freq: Two times a day (BID) | ORAL | 3 refills | Status: DC

## 2023-06-22 MED ORDER — FUROSEMIDE 40 MG PO TABS
40.0000 mg | ORAL_TABLET | ORAL | 0 refills | Status: AC
Start: 2023-06-22 — End: 2024-04-09

## 2023-06-22 NOTE — Progress Notes (Signed)
 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 Discuss with patient their interest in participating in the free health coaching program and refer to REF 2201/Care Navigation.

## 2023-06-22 NOTE — Patient Instructions (Signed)
Medication Instructions:  Your physician has recommended you make the following change in your medication:  STOP: Potassium  CHANGE: Lasix 40 mg once weekly DECREASE: Coreg 12.5 mg twice daily  *If you need a refill on your cardiac medications before your next appointment, please call your pharmacy*   Lab Work: CMET, Mag If you have labs (blood work) drawn today and your tests are completely normal, you will receive your results only by: MyChart Message (if you have MyChart) OR A paper copy in the mail If you have any lab test that is abnormal or we need to change your treatment, we will call you to review the results.   Follow-Up: At [facility], you and your health needs are our priority.  As part of our continuing mission to provide you with exceptional heart care, we have created designated Provider Care Teams.  These Care Teams include your primary Cardiologist (physician) and Advanced Practice Providers (APPs -  Physician Assistants and Nurse Practitioners) who all work together to provide you with the care you need, when you need it.    Your next appointment:   6 month(s)  Provider:   Thomasene Ripple, DO     Other Instructions:

## 2023-06-23 LAB — COMPREHENSIVE METABOLIC PANEL
ALT: 39 [IU]/L — ABNORMAL HIGH (ref 0–32)
AST: 28 [IU]/L (ref 0–40)
Albumin: 4.2 g/dL (ref 3.8–4.9)
Alkaline Phosphatase: 121 [IU]/L (ref 44–121)
BUN/Creatinine Ratio: 17 (ref 9–23)
BUN: 17 mg/dL (ref 6–24)
Bilirubin Total: 0.9 mg/dL (ref 0.0–1.2)
CO2: 24 mmol/L (ref 20–29)
Calcium: 9.6 mg/dL (ref 8.7–10.2)
Chloride: 102 mmol/L (ref 96–106)
Creatinine, Ser: 1.02 mg/dL — ABNORMAL HIGH (ref 0.57–1.00)
Globulin, Total: 2.2 g/dL (ref 1.5–4.5)
Glucose: 113 mg/dL — ABNORMAL HIGH (ref 70–99)
Potassium: 4.4 mmol/L (ref 3.5–5.2)
Sodium: 142 mmol/L (ref 134–144)
Total Protein: 6.4 g/dL (ref 6.0–8.5)
eGFR: 67 mL/min/{1.73_m2} (ref >59)

## 2023-06-23 LAB — MAGNESIUM: Magnesium: 2.2 mg/dL (ref 1.6–2.3)

## 2023-06-23 NOTE — Progress Notes (Unsigned)
Cardiology Office Note:    Date:  06/24/2023   ID:  Carolyn Salazar, DOB 03-16-1971, MRN [id number]  PCP:  Joselyn Arrow, MD  Cardiologist:  Thomasene Ripple, DO  Electrophysiologist:  None   Referring MD: Joselyn Arrow, MD   " I am having some palpitations"  History of Present Illness:    Carolyn Salazar is a 53 y.o. female with a hx of nonischemic cardiomyopathy which based on chart review going back to 2000 she had a ejection fraction of 20%, Catheterization was done then when she lived in [place], revealed normal coronaries.  In 2017 she had some chest pain.  Nuclear stress test was abnormal.  Diagnostic catheterization in March 2017 showed no significant coronary disease.  Her ejection fraction then was 45 to 50%, diabetes mellitus on metformin and Jardiance, hypertension, GERD.   Since her last visit with me she hsa been doing well. She is excited that her daughter has moved to [place].  Past Medical History:  Diagnosis Date   Allergy    Arthritis    Hands, Knees RT>LT   BENIGN NEOPLASM OF SKIN SITE UNSPECIFIED    benign mole   Blood transfusion without reported diagnosis    BUNIONS, BILATERAL    CARDIOMYOPATHY 02/1999   EF 20% in 02/1999, improved over time-  EF 40-45% at cath 2017   Chest pain    normal coronaries 2000 and 2017 (after an abnormal Myoview)   CHF (congestive heart failure) (HCC)    DEPRESSION    DIABETES MELLITUS, TYPE II, CONTROLLED, MILD    DYSLIPIDEMIA    GERD (gastroesophageal reflux disease)    Hypotension    November, 2012   METABOLIC SYNDROME X    hypertriglycerides 04/2008, hyperglycemia   MIGRAINE HEADACHE    NASH (nonalcoholic steatohepatitis)    OBESITY    Shingles (herpes zoster) polyneuropathy    Sinus tachycardia     Past Surgical History:  Procedure Laterality Date   BUNIONECTOMY  2012   LEFT   BUNIONECTOMY Right    CARDIAC CATHETERIZATION  2000   no CAD   CARDIAC CATHETERIZATION N/A 08/25/2015   Procedure: Left Heart Cath and  Coronary Angiography;  Surgeon: Lennette Bihari, MD;  Location: [facility];  Service: Cardiovascular;  Laterality: N/A;   heart biopsy     mole removed     right arm age 46   RIGHT/LEFT HEART CATH AND CORONARY ANGIOGRAPHY N/A 05/18/2021   Procedure: RIGHT/LEFT HEART CATH AND CORONARY ANGIOGRAPHY;  Surgeon: Kathleene Hazel, MD;  Location: [facility];  Service: Cardiovascular;  Laterality: N/A;   vein scope      Current Medications: Current Meds  Medication Sig   ARIPiprazole (ABILIFY) 15 MG tablet Take 15 mg by mouth daily.   aspirin-acetaminophen-caffeine (EXCEDRIN MIGRAINE) 250-250-65 MG tablet Take 3 tablets by mouth daily as needed for headache.   atorvastatin (LIPITOR) 10 MG tablet TAKE 1 TABLET BY MOUTH EVERY DAY   b complex vitamins tablet Take 1 tablet by mouth daily.   carvedilol (COREG) 12.5 MG tablet Take 1 tablet (12.5 mg total) by mouth 2 (two) times daily.   cetirizine (ZYRTEC) 10 MG tablet Take 10 mg by mouth daily.     Cholecalciferol (DIALYVITE VITAMIN D 5000) 125 MCG (5000 UT) capsule Take 5,000 Units by mouth daily.   Collagen-Boron-Hyaluronic Acid (MOVE FREE ULTRA JOINT HEALTH) 40-5-3.3 MG TABS Take 1 tablet by mouth daily.   Continuous Glucose Sensor (FREESTYLE LIBRE 3 PLUS SENSOR) MISC 1  each by Does not apply route every 14 (fourteen) days. Change sensor every 15 days   diclofenac Sodium (VOLTAREN) 1 % GEL Apply 1 application  topically daily as needed (pain).   ENTRESTO 24-26 MG TAKE 1 TABLET BY MOUTH TWICE DAILY   esomeprazole (NEXIUM) 20 MG capsule Take 20 mg by mouth daily.   Eszopiclone 3 MG TABS Take 3 mg by mouth at bedtime.   FLUoxetine (PROZAC) 40 MG capsule Take 80 mg by mouth daily.   fluticasone (FLONASE) 50 MCG/ACT nasal spray Place 2 sprays into both nostrils daily.   furosemide (LASIX) 40 MG tablet Take 1 tablet (40 mg total) by mouth once a week.   JARDIANCE 25 MG TABS tablet TAKE 1 TABLET BY MOUTH DAILY   lamoTRIgine  (LAMICTAL) 200 MG tablet Take 400 mg by mouth daily.   metFORMIN (GLUCOPHAGE) 1000 MG tablet TAKE 1 TABLET BY MOUTH TWICE DAILY WITH FOOD   olopatadine (PATANOL) 0.1 % ophthalmic solution Place 1 drop into both eyes 2 (two) times daily as needed for allergies.   Omega-3 Fatty Acids (FISH OIL) 1000 MG CAPS Take 1,000 mg by mouth daily.   Probiotic Product (PROBIOTIC PO) Take 1 capsule by mouth daily.   spironolactone (ALDACTONE) 25 MG tablet TAKE 1/2 TABLET BY MOUTH DAILY   tirzepatide (MOUNJARO) 7.5 MG/0.5ML Pen ADMINISTER 7.5 MG UNDER THE SKIN 1 TIME A WEEK   Ubrogepant (UBRELVY) 50 MG TABS Take 1-2 tablets (50-100 mg total) by mouth daily as needed (migraine).   [DISCONTINUED] carvedilol (COREG) 25 MG tablet TAKE 1 TABLET BY MOUTH TWICE DAILY WITH MEALS   [DISCONTINUED] furosemide (LASIX) 40 MG tablet Take 1 tablet (40 mg total) by mouth daily.   [DISCONTINUED] potassium chloride SA (KLOR-CON M) 20 MEQ tablet TAKE 1 TABLET BY MOUTH EVERY DAY   Current Facility-Administered Medications for the 06/22/23 encounter (Office Visit) with Thomasene Ripple, DO  Medication   levonorgestrel (MIRENA) 20 MCG/24HR IUD     Allergies:   Sulfonamide derivatives   Social History   Socioeconomic History   Marital status: Legally Carolyn Salazar    Spouse name: Not on file   Number of children: 1   Years of education: 16   Highest education level: Not on file  Occupational History   Occupation: Disabled  Tobacco Use   Smoking status: Never   Smokeless tobacco: Never  Vaping Use   Vaping status: Never Used  Substance and Sexual Activity   Alcohol use: Not Currently   Drug use: No   Sexual activity: Not Currently    Birth control/protection: Other-see comments    Comment: TUBAL LIGATION, 1st intercourse- 20, partners- 3  Other Topics Concern   Not on file  Social History Narrative   Carolyn Salazar 08/2021, Lives with 2 cats   1 daughter, married and lives in [place]. Moving back to [place]      Has 2 brothers  and a sister--all adopted (not blood-related)      Epworth Sleepiness Scale = 10 (as of 07/21/2015)      Disabled (related to heart/cardiomyopathy)      Updated 03/2023   Social Drivers of Health   Financial Resource Strain: Low Risk  (03/29/2023)   Overall Financial Resource Strain (CARDIA)    Difficulty of Paying Living Expenses: Not hard at all  Food Insecurity: No Food Insecurity (03/29/2023)   Hunger Vital Sign    Worried About Running Out of Food in the Last Year: Never true    Ran Out of Food  in the Last Year: Never true  Transportation Needs: No Transportation Needs (03/29/2023)   PRAPARE - Administrator, Civil Service (Medical): No    Lack of Transportation (Non-Medical): No  Physical Activity: Insufficiently Active (03/29/2023)   Exercise Vital Sign    Days of Exercise per Week: 3 days    Minutes of Exercise per Session: 30 min  Stress: No Stress Concern Present (03/29/2023)   [employer] of Occupational Health - Occupational Stress Questionnaire    Feeling of Stress : Only a little  Social Connections: Unknown (03/29/2023)   Social Connection and Isolation Panel [NHANES]    Frequency of Communication with Friends and Family: More than three times a week    Frequency of Social Gatherings with Friends and Family: Twice a week    Attends Religious Services: Patient declined    Database administrator or Organizations: No    Attends Engineer, structural: Not on file    Marital Status: Married     Family History: The patient's family history includes Arthritis in an other family member; Cancer in her mother; Diabetes in her daughter, father, and mother; Diabetes type II in her mother; Heart attack in her father; Heart failure in her father; Hyperlipidemia in her mother and another family member; Hypertension in an other family member. There is no history of Colon cancer, Colon polyps, Esophageal cancer, Rectal cancer, or Stomach cancer.  ROS:   Review  of Systems  Constitution: Negative for decreased appetite, fever and weight gain.  HENT: Negative for congestion, ear discharge, hoarse voice and sore throat.   Eyes: Negative for discharge, redness, vision loss in right eye and visual halos.  Cardiovascular: Negative for chest pain, dyspnea on exertion, leg swelling, orthopnea and palpitations.  Respiratory: Negative for cough, hemoptysis, shortness of breath and snoring.   Endocrine: Negative for heat intolerance and polyphagia.  Hematologic/Lymphatic: Negative for bleeding problem. Does not bruise/bleed easily.  Skin: Negative for flushing, nail changes, rash and suspicious lesions.  Musculoskeletal: Negative for arthritis, joint pain, muscle cramps, myalgias, neck pain and stiffness.  Gastrointestinal: Negative for abdominal pain, bowel incontinence, diarrhea and excessive appetite.  Genitourinary: Negative for decreased libido, genital sores and incomplete emptying.  Neurological: Negative for brief paralysis, focal weakness, headaches and loss of balance.  Psychiatric/Behavioral: Negative for altered mental status, depression and suicidal ideas.  Allergic/Immunologic: Negative for HIV exposure and persistent infections.    EKGs/Labs/Other Studies Reviewed:    The following studies were reviewed today:   EKG: EKG shows sinus tachycardia, heart rate 103 bpm.  R/L heart catheterization No angiographic evidence of CAD Normal right and left heart pressures 3.   Non-ischemic cardiomyopathy   Recommendations: No further ischemic workup. Her cardiomyopathy is felt to be non-ischemic.      Transthoracic echocardiogram April 22, 2021 IMPRESSIONS     1. Left ventricular ejection fraction, by estimation, is 40%. The left  ventricle has moderately decreased function. The left ventricle  demonstrates regional wall motion abnormalities (see scoring  diagram/findings for description). There is mild  asymmetric left ventricular  hypertrophy of the infero-lateral segment.  Left ventricular diastolic parameters are consistent with Grade I  diastolic dysfunction (impaired relaxation).   2. Right ventricular systolic function is normal. The right ventricular  size is normal. Tricuspid regurgitation signal is inadequate for assessing  PA pressure.   3. The mitral valve is normal in structure. Trivial mitral valve  regurgitation. No evidence of mitral stenosis.   4. The  aortic valve is tricuspid. Aortic valve regurgitation is not  visualized. No aortic stenosis is present.   5. The inferior vena cava is normal in size with greater than 50%  respiratory variability, suggesting right atrial pressure of 3 mmHg.   FINDINGS   Left Ventricle: Left ventricular ejection fraction, by estimation, is  40%. The left ventricle has moderately decreased function. The left  ventricle demonstrates regional wall motion abnormalities. The left  ventricular internal cavity size was normal in  size. There is mild asymmetric left ventricular hypertrophy of the  infero-lateral segment. Left ventricular diastolic parameters are  consistent with Grade I diastolic dysfunction (impaired relaxation).      LV Wall Scoring:  The apical lateral segment, apical septal segment, apical anterior  segment,  and apical inferior segment are hypokinetic.   Right Ventricle: The right ventricular size is normal. No increase in  right ventricular wall thickness. Right ventricular systolic function is  normal. Tricuspid regurgitation signal is inadequate for assessing PA  pressure.   Left Atrium: Left atrial size was normal in size.   Right Atrium: Right atrial size was normal in size.   Pericardium: There is no evidence of pericardial effusion.   Mitral Valve: The mitral valve is normal in structure. Trivial mitral  valve regurgitation. No evidence of mitral valve stenosis.   Tricuspid Valve: The tricuspid valve is normal in structure. Tricuspid   valve regurgitation is trivial. No evidence of tricuspid stenosis.   Aortic Valve: The aortic valve is tricuspid. Aortic valve regurgitation is  not visualized. No aortic stenosis is present.   Pulmonic Valve: The pulmonic valve was normal in structure. Pulmonic valve  regurgitation is trivial. No evidence of pulmonic stenosis.   Aorta: The aortic root is normal in size and structure.   Venous: The inferior vena cava is normal in size with greater than 50%  respiratory variability, suggesting right atrial pressure of 3 mmHg.   IAS/Shunts: The interatrial septum was not well visualized  Recent Labs: 03/29/2023: Hemoglobin 15.4; Platelets 418; TSH 2.620 06/22/2023: ALT 39; BUN 17; Creatinine, Ser 1.02; Magnesium 2.2; Potassium 4.4; Sodium 142  Recent Lipid Panel    Component Value Date/Time   CHOL 143 11/01/2022 1604   TRIG 142 11/01/2022 1604   HDL 47 11/01/2022 1604   CHOLHDL 3.0 11/01/2022 1604   CHOLHDL 4.5 03/29/2016 0907   VLDL 78 (H) 03/29/2016 0907   LDLCALC 71 11/01/2022 1604   LDLDIRECT 130.8 01/15/2014 1209    Physical Exam:    VS:  BP (!) 89/62 (BP Location: Right Arm, Patient Position: Sitting, Cuff Size: Normal)   Pulse 95   Ht 5\' 6"  (1.676 m)   Wt 194 lb 12.8 oz (88.4 kg)   SpO2 97%   BMI 31.44 kg/m     Wt Readings from Last 3 Encounters:  06/22/23 194 lb 12.8 oz (88.4 kg)  03/29/23 191 lb 6.4 oz (86.8 kg)  03/08/23 195 lb 6.4 oz (88.6 kg)     GEN: Well nourished, well developed in no acute distress HEENT: Normal NECK: No JVD; No carotid bruits LYMPHATICS: No lymphadenopathy CARDIAC: S1S2 noted,RRR, no murmurs, rubs, gallops RESPIRATORY:  Clear to auscultation without rales, wheezing or rhonchi  ABDOMEN: Soft, non-tender, non-distended, +bowel sounds, no guarding. EXTREMITIES: No edema, No cyanosis, no clubbing MUSCULOSKELETAL:  No deformity  SKIN: Warm and dry NEUROLOGIC:  Alert and oriented x 3, non-focal PSYCHIATRIC:  Normal affect, good  insight  ASSESSMENT:    1. Medication management  2. NICM (nonischemic cardiomyopathy) (HCC)   3. Chronic combined systolic and diastolic CHF (congestive heart failure) (HCC)   4. Obstructive sleep apnea   5. NASH (nonalcoholic steatohepatitis)   6. Controlled type 2 diabetes mellitus with complication, without long-term current use of insulin (HCC)   7. Mixed hyperlipidemia     PLAN:    Today will make come changes to her medication regimen due to her low blood pressure. Cut coreg to 12.5 mg BID and Lasix will be change to once weekly. Continue same dose of Jardiance, aldactone,   Lifestyle modification advised.   Continue with current dose of statin.    The patient understands the need to lose weight with diet and exercise. We have discussed specific strategies for this.  The patient is in agreement with the above plan. The patient left the office in stable condition.  The patient will follow up in 12 weeks or sooner if needed.   Medication Adjustments/Labs and Tests Ordered: Current medicines are reviewed at length with the patient today.  Concerns regarding medicines are outlined above.  Orders Placed This Encounter  Procedures   Comprehensive Metabolic Panel (CMET)   Magnesium   Meds ordered this encounter  Medications   furosemide (LASIX) 40 MG tablet    Sig: Take 1 tablet (40 mg total) by mouth once a week.    Dispense:  52 tablet    Refill:  0   carvedilol (COREG) 12.5 MG tablet    Sig: Take 1 tablet (12.5 mg total) by mouth 2 (two) times daily.    Dispense:  180 tablet    Refill:  3    Patient Instructions  Medication Instructions:  Your physician has recommended you make the following change in your medication:  STOP: Potassium  CHANGE: Lasix 40 mg once weekly DECREASE: Coreg 12.5 mg twice daily  *If you need a refill on your cardiac medications before your next appointment, please call your pharmacy*   Lab Work: CMET, Mag If you have labs (blood  work) drawn today and your tests are completely normal, you will receive your results only by: MyChart Message (if you have MyChart) OR A paper copy in the mail If you have any lab test that is abnormal or we need to change your treatment, we will call you to review the results.   Follow-Up: At [facility], you and your health needs are our priority.  As part of our continuing mission to provide you with exceptional heart care, we have created designated Provider Care Teams.  These Care Teams include your primary Cardiologist (physician) and Advanced Practice Providers (APPs -  Physician Assistants and Nurse Practitioners) who all work together to provide you with the care you need, when you need it.    Your next appointment:   6 month(s)  Provider:   Thomasene Ripple, DO     Other Instructions:      Adopting a Healthy Lifestyle.  Know what a healthy weight is for you (roughly BMI <25) and aim to maintain this   Aim for 7+ servings of fruits and vegetables daily   65-80+ fluid ounces of water or unsweet tea for healthy kidneys   Limit to max 1 drink of alcohol per day; avoid smoking/tobacco   Limit animal fats in diet for cholesterol and heart health - choose grass fed whenever available   Avoid highly processed foods, and foods high in saturated/trans fats   Aim for low stress - take time to unwind  and care for your mental health   Aim for 150 min of moderate intensity exercise weekly for heart health, and weights twice weekly for bone health   Aim for 7-9 hours of sleep daily   When it comes to diets, agreement about the perfect plan isnt easy to find, even among the experts. Experts at the [facility] [employer] developed an idea known as the Healthy Eating Plate. Just imagine a plate divided into logical, healthy portions.   The emphasis is on diet quality:   Load up on vegetables and fruits - one-half of your plate: Aim for color and variety, and  remember that potatoes dont count.   Go for whole grains - one-quarter of your plate: Whole wheat, barley, wheat berries, quinoa, oats, brown rice, and foods made with them. If you want pasta, go with whole wheat pasta.   Protein power - one-quarter of your plate: Fish, chicken, beans, and nuts are all healthy, versatile protein sources. Limit red meat.   The diet, however, does go beyond the plate, offering a few other suggestions.   Use healthy plant oils, such as olive, canola, soy, corn, sunflower and peanut. Check the labels, and avoid partially hydrogenated oil, which have unhealthy trans fats.   If youre thirsty, drink water. Coffee and tea are good in moderation, but skip sugary drinks and limit milk and dairy products to one or two daily servings.   The type of carbohydrate in the diet is more important than the amount. Some sources of carbohydrates, such as vegetables, fruits, whole grains, and beans-are healthier than others.   Finally, stay active  Signed, Thomasene Ripple, DO  06/24/2023 9:44 AM    [facility]

## 2023-06-26 ENCOUNTER — Encounter: Payer: Self-pay | Admitting: Cardiology

## 2023-07-19 LAB — HM DIABETES EYE EXAM

## 2023-07-29 NOTE — Progress Notes (Unsigned)
 No chief complaint on file.  Diabetes:  Greggory Keen was added to her regimen of metformin and Jardiance in March 2024, due to A1c being above goal. A1c was 7.4%.  At her visit in June, she was taking dose of 7.5mg  weekly, and A1c had improved to 5.3%, and down to 4.6 and most recent visit. She continues on 7.5mg  dose. She is tolerating these medications without side effects.  Her appetite is decreased, eating smaller quantities. No nausea or vomiting.  +constipation, managed by high fiber diet.   Sugars are running ***  No hypoglycemia, polydipsia, polyuria. She checks her feet regularly and denies lesions/burning/concerns. She had diabetic eye exam in 06/2023, no retinopathy.    Component Ref Range & Units (hover) 4 mo ago (03/29/23) 9 mo ago (11/01/22) 1 yr ago (07/24/22) 1 yr ago (03/16/22) 1 yr ago (09/07/21) 2 yr ago (06/06/21) 2 yr ago (03/01/21)  Hemoglobin A1C 4.6 5.3 7.4 Abnormal  7.0 Abnormal  6.4 Abnormal  7.4 High  R, CM 6.9 High  R, CM     Nonischemic cardiomyopathy, under the care of Dr. Servando Salina.  She last saw her in January.  She had echocardiogram in 05/2023, EF 40-45% Her carvedilol dose was decreased to 12.5 mg and lasix was decreased to just once a week, due to low blood pressures. She also continues on [place], Aldactone, Jardiance.  BPs are running  Dizziness? *** No headaches (other than menstrual migraines), chest pain, DOE.      Obstructive sleep apnea: She had sleep study in March 2023, revealing moderate OSA, but severe with supine sleep (pt states she never sleeps on her back, but was told to for the study).  Moderate oxygen desaturation down to 84%. She had never started CPAP, was very expensive, was waiting for her new insurance. Elected not to pursue that, and will consider repeat sleep study after weight loss, since this might improve. Denies unrefreshed sleep. Daytime somnolence has improved. Occasionally takes a nap.      Mixed hyperlipidemia:  Patient  reports compliance with atorvastatin 10mg  and  1 capsule of omega 3 fish oil daily, without side effects. Lipids were at goal on this regimen in 10/2022. She continues on same regimen, and tries to follow lowfat, low cholesterol diet.  Meds rx'd by cardiologist. Lab Results  Component Value Date   CHOL 143 11/01/2022   HDL 47 11/01/2022   LDLCALC 71 11/01/2022   LDLDIRECT 130.8 01/15/2014   TRIG 142 11/01/2022   CHOLHDL 3.0 11/01/2022      Migraines:  Bernita Raisin is effective for her. Migraines are hormonal.  Migraines are usually once a month, for 1-2 days. No aura.  She has Mirena--no cycles, but still gets hormonal changes/breast tenderness. Gets Ubrelvy through PAP.     PMH, PSH, SH reviewed    ROS: No f/c, URI symptoms, cough. No chest pain or palpitations. No n/v, bowel changes, urinary complaints.  No bleeding, bruising, rashes  Constipation? Dizziness? DOE Edema?   PHYSICAL EXAM:  There were no vitals taken for this visit.  Wt Readings from Last 3 Encounters:  06/22/23 194 lb 12.8 oz (88.4 kg)  03/29/23 191 lb 6.4 oz (86.8 kg)  03/08/23 195 lb 6.4 oz (88.6 kg)   03/08/23 195 lb 6.4 oz (88.6 kg) 11/01/22 218 lb 9.6 oz (99.2 kg)  07/24/22 242 lb 12.8 oz (110.1 kg)  03/16/22 246 lb 6.4 oz (111.8 kg)   Pleasant, well-appearing female in no distress HEENT: conjunctiva and sclera are clear, EOMI.  OP clear Neck: no lymphadenopathy, thyromegaly or carotid bruit Heart: regular rate and rhythm Lungs: clear bilaterally Back no spinal or CVA tenderness Abdomen: soft, nontender, no organomegaly or mass Extremities: no edema Psych: appears calm, reportedly anxious. Full range of affect. Normal eye contact, speech, hygiene and grooming Neuro: alert and oriented, cranial nerves grossly intact. Normal gait    ASSESSMENT/PLAN:  Prevnar-20 today  If she is using a CGM, please get report printed  A1c Urine microalbumin  Lipids due in June--meds rx'd by Dr. Servando Salina, so  likely she will check this. She doesn't have any f/u with her scheduled--needs to schedule.  RF jardiance, mounjaro. Is she still taking metformin??  Probably can stop     Sleep apnea--revisit sleep study?? Weight seems to have stabilized

## 2023-07-30 ENCOUNTER — Encounter: Payer: Self-pay | Admitting: Family Medicine

## 2023-07-30 ENCOUNTER — Ambulatory Visit (INDEPENDENT_AMBULATORY_CARE_PROVIDER_SITE_OTHER): Payer: Medicare Other | Admitting: Family Medicine

## 2023-07-30 VITALS — BP 90/50 | HR 84 | Ht 66.0 in | Wt 199.8 lb

## 2023-07-30 DIAGNOSIS — E118 Type 2 diabetes mellitus with unspecified complications: Secondary | ICD-10-CM | POA: Diagnosis not present

## 2023-07-30 DIAGNOSIS — Z23 Encounter for immunization: Secondary | ICD-10-CM

## 2023-07-30 DIAGNOSIS — E1169 Type 2 diabetes mellitus with other specified complication: Secondary | ICD-10-CM

## 2023-07-30 DIAGNOSIS — E781 Pure hyperglyceridemia: Secondary | ICD-10-CM

## 2023-07-30 DIAGNOSIS — E785 Hyperlipidemia, unspecified: Secondary | ICD-10-CM | POA: Diagnosis not present

## 2023-07-30 DIAGNOSIS — E782 Mixed hyperlipidemia: Secondary | ICD-10-CM

## 2023-07-30 DIAGNOSIS — I5042 Chronic combined systolic (congestive) and diastolic (congestive) heart failure: Secondary | ICD-10-CM | POA: Diagnosis not present

## 2023-07-30 DIAGNOSIS — G4733 Obstructive sleep apnea (adult) (pediatric): Secondary | ICD-10-CM

## 2023-07-30 DIAGNOSIS — G43829 Menstrual migraine, not intractable, without status migrainosus: Secondary | ICD-10-CM

## 2023-07-30 DIAGNOSIS — I428 Other cardiomyopathies: Secondary | ICD-10-CM

## 2023-07-30 LAB — POCT GLYCOSYLATED HEMOGLOBIN (HGB A1C): Hemoglobin A1C: 4.8 % (ref 4.0–5.6)

## 2023-07-30 MED ORDER — BLOOD GLUCOSE MONITORING SUPPL DEVI: 1.0000 | Freq: Three times a day (TID) | 0 refills | Status: AC

## 2023-07-30 MED ORDER — TIRZEPATIDE 10 MG/0.5ML ~~LOC~~ SOAJ: 10.0000 mg | SUBCUTANEOUS | 2 refills | Status: DC

## 2023-07-30 MED ORDER — LANCET DEVICE MISC: 1.0000 | Freq: Three times a day (TID) | 0 refills | Status: AC

## 2023-07-30 MED ORDER — BLOOD GLUCOSE TEST VI STRP: 1.0000 | ORAL_STRIP | Freq: Every day | 2 refills | Status: AC

## 2023-07-30 MED ORDER — EMPAGLIFLOZIN 25 MG PO TABS
25.0000 mg | ORAL_TABLET | Freq: Every day | ORAL | 1 refills | Status: DC
Start: 2023-07-30 — End: 2024-02-29

## 2023-07-30 MED ORDER — LANCETS MISC. MISC: 1.0000 | Freq: Every day | 2 refills | Status: AC

## 2023-07-30 NOTE — Patient Instructions (Signed)
  We are increasing your Mounjaro to 10 mg today (meaning, in 3 weeks when you are due for your next refill).  This is to help further with appetite and weight loss (not diabetes--that is extremely well controlled). Contact us in 1-2 months from starting the 10 mg dose, and let us know how you are doing.  If you see decreased appetite and weight loss, then you don't need to further titrate up. We can stay at the 10 mg dose for 2-3 months and then re-evaluate. If you notice no significant improvement in appetite or weight after a month at the 10 mg dose, we can further increase to 12.5 mg weekly dose.  Be sure to limit the portions of nuts. Try and limit/avoid diet sodas (occasional is okay). Switch the Nexium to before dinner (to see if this helps decrease the nightly heartburn). If you end up with more heartburn during the day, you may either need it twice daily, or a prescription dose. An alternative would be nexium before breakfast, and famotidine before dinner.

## 2023-07-31 ENCOUNTER — Encounter: Payer: Self-pay | Admitting: Family Medicine

## 2023-07-31 LAB — MICROALBUMIN / CREATININE URINE RATIO
Creatinine, Urine: 89.6 mg/dL
Microalb/Creat Ratio: 7 mg/g{creat} (ref 0–29)
Microalbumin, Urine: 6.7 ug/mL

## 2023-08-15 ENCOUNTER — Other Ambulatory Visit: Payer: Self-pay | Admitting: Family Medicine

## 2023-08-15 DIAGNOSIS — Z0000 Encounter for general adult medical examination without abnormal findings: Secondary | ICD-10-CM

## 2023-08-22 ENCOUNTER — Ambulatory Visit
Admission: RE | Admit: 2023-08-22 | Discharge: 2023-08-22 | Disposition: A | Discharge status: Home / Self Care | Source: Ambulatory Visit | Attending: Family Medicine | Admitting: Family Medicine

## 2023-08-22 DIAGNOSIS — Z1231 Encounter for screening mammogram for malignant neoplasm of breast: Secondary | ICD-10-CM | POA: Diagnosis not present

## 2023-08-22 DIAGNOSIS — Z0000 Encounter for general adult medical examination without abnormal findings: Secondary | ICD-10-CM

## 2023-10-20 ENCOUNTER — Other Ambulatory Visit: Payer: Self-pay | Admitting: Cardiology

## 2023-10-22 ENCOUNTER — Other Ambulatory Visit: Payer: Self-pay | Admitting: Cardiology

## 2023-10-25 ENCOUNTER — Other Ambulatory Visit: Payer: Self-pay

## 2023-10-25 MED ORDER — SPIRONOLACTONE 25 MG PO TABS: 12.5000 mg | ORAL_TABLET | Freq: Every day | ORAL | 1 refills | Status: DC

## 2023-11-11 ENCOUNTER — Other Ambulatory Visit: Payer: Self-pay | Admitting: Family Medicine

## 2023-11-11 DIAGNOSIS — E118 Type 2 diabetes mellitus with unspecified complications: Secondary | ICD-10-CM

## 2023-12-04 NOTE — Progress Notes (Unsigned)
 No chief complaint on file.  Patient presents for 4 month follow-up on chronic problems.  Diabetes:  Mounjaro  was added to her regimen of metformin  and Jardiance  in March 2024, due to A1c being above goal. A1c was 7.4%. Metformin  was later stopped (when A1c normal).  Mounjaro  dose was titrated up to 10 mg at her visit in March, to further help with weight loss. She continues on Jardiance .  Further help with appetite with increased dose? N/v? *** +constipation, managed by high fiber diet (metamucil gummies 2x/d).  Did she get glucometer? Checking sugars? ***  She checks her feet regularly and denies lesions/burning/concerns. She had diabetic eye exam in 06/2023, no retinopathy.      Lab Results  Component Value Date   HGBA1C 4.8 07/30/2023     Obesity: Mounjaro  dose was increased to 10 mg in March to help further with weight loss (diabetes was well controlled on the 7.5 mg dose). She was advised to contact us  in 1-2 months if no decrease in appetite or any weight loss, to further titrate the dose (we didn't hear from her). We talked about limiting portions of nuts, limiting diet sodas.    GERD:  in March we discussed switching nexium to before dinner (to see if helps with nightly heartburn), but if more heartburn during the day, could take it twice daily, or nexium before breakfast and famotidine before dinner.   Nonischemic cardiomyopathy, under the care of Dr. Sheena.  She last saw her in January.  She had echocardiogram in 05/2023, EF 40-45% Her carvedilol  dose was decreased to 12.5 mg and lasix  was decreased to just once a week, due to low blood pressures. She also continues on Entresto , Aldactone , Jardiance .   BPs are not checked at home. She hasn't had any dizziness since med changes were made by cardiologist. No headaches (other than menstrual migraines), chest pain, DOE. Occasional tension headache.  BP Readings from Last 3 Encounters:  07/30/23 (!) 90/50  06/22/23 (!)  89/62  03/29/23 (!) 90/58      Obstructive sleep apnea: She had sleep study in March 2023, revealing moderate OSA, but severe with supine sleep (pt states she never sleeps on her back, but was told to for the study).  Moderate oxygen desaturation down to 84%. She had never started CPAP, was very expensive, was waiting for her new insurance. Elected not to pursue that. She has lost weight. She denies unrefreshed sleep. Daytime somnolence has improved. Occasionally takes a nap.      Mixed hyperlipidemia:  Patient reports compliance with atorvastatin  10mg  and  1 capsule of omega 3 fish oil daily, without side effects. Lipids were at goal on this regimen in 10/2022, due for recheck. She continues on same regimen, and tries to follow lowfat, low cholesterol diet.  Meds rx'd by cardiologist.  Lab Results  Component Value Date   CHOL 143 11/01/2022   HDL 47 11/01/2022   LDLCALC 71 11/01/2022   LDLDIRECT 130.8 01/15/2014   TRIG 142 11/01/2022   CHOLHDL 3.0 11/01/2022     Migraines:  Ubrelvy  is effective for her. Migraines are hormonal.  Migraines are usually once a month, for 1-2 days. No aura.  She has Mirena --no cycles, but still gets hormonal changes/breast tenderness. Gets Ubrelvy  through PAP.     PMH, PSH, SH reviewed   ROS: No f/c, URI symptoms, cough. No chest pain or palpitations. Denies DOE, edema. Denies dizziness  No n/v, bowel changes, urinary complaints.  Constipation is well controlled. No  bleeding, bruising, rashes  Heartburn nightly?    PHYSICAL EXAM:  There were no vitals taken for this visit.  Wt Readings from Last 3 Encounters:  07/30/23 199 lb 12.8 oz (90.6 kg)  06/22/23 194 lb 12.8 oz (88.4 kg)  03/29/23 191 lb 6.4 oz (86.8 kg)   03/29/23 191 lb 6.4 oz (86.8 kg)  10/17/24195 lb 6.4 oz (88.6 kg) 11/01/22 218 lb 9.6 oz (99.2 kg)  07/24/22 242 lb 12.8 oz (110.1 kg)  03/16/22 246 lb 6.4 oz (111.8 kg)   Pleasant, well-appearing female in no  distress HEENT: conjunctiva and sclera are clear, EOMI. OP clear Neck: no lymphadenopathy, thyromegaly or carotid bruit Heart: regular rate and rhythm Lungs: clear bilaterally Back no spinal or CVA tenderness Abdomen: soft, nontender, no organomegaly or mass Extremities: no edema Psych: Normal mood, full range of affect. Normal eye contact, speech, hygiene and grooming Neuro: alert and oriented, cranial nerves grossly intact. Normal gait     ASSESSMENT/PLAN:  A1c not needed--can wait until November CPE (was very low, dose increased for wt loss, not DM)   Did she get glucometer? Checking sugars?  Needs to schedule f/u with cardiologist Dr. Sheena  F/u as scheduled for physical in November

## 2023-12-05 ENCOUNTER — Ambulatory Visit (INDEPENDENT_AMBULATORY_CARE_PROVIDER_SITE_OTHER): Admitting: Family Medicine

## 2023-12-05 ENCOUNTER — Encounter: Payer: Self-pay | Admitting: Family Medicine

## 2023-12-05 VITALS — BP 116/60 | HR 76 | Ht 66.0 in | Wt 187.6 lb

## 2023-12-05 DIAGNOSIS — I5042 Chronic combined systolic (congestive) and diastolic (congestive) heart failure: Secondary | ICD-10-CM

## 2023-12-05 DIAGNOSIS — E1169 Type 2 diabetes mellitus with other specified complication: Secondary | ICD-10-CM

## 2023-12-05 DIAGNOSIS — E118 Type 2 diabetes mellitus with unspecified complications: Secondary | ICD-10-CM

## 2023-12-05 DIAGNOSIS — G4733 Obstructive sleep apnea (adult) (pediatric): Secondary | ICD-10-CM | POA: Diagnosis not present

## 2023-12-05 DIAGNOSIS — E66812 Obesity, class 2: Secondary | ICD-10-CM

## 2023-12-05 DIAGNOSIS — I428 Other cardiomyopathies: Secondary | ICD-10-CM

## 2023-12-05 DIAGNOSIS — Z6839 Body mass index (BMI) 39.0-39.9, adult: Secondary | ICD-10-CM

## 2023-12-05 DIAGNOSIS — E785 Hyperlipidemia, unspecified: Secondary | ICD-10-CM | POA: Diagnosis not present

## 2023-12-05 MED ORDER — MOUNJARO 10 MG/0.5ML ~~LOC~~ SOAJ: 10.0000 mg | SUBCUTANEOUS | 4 refills | Status: DC

## 2023-12-05 NOTE — Patient Instructions (Addendum)
 Please schedule routine follow-up with Dr. Sheena.  Please be sure to get some strength training and weight-bearing exercise at least 2x/week, along with eating adequate protein, in order to prevent loss of muscle mass.  Please cut back on the caffeinated diet sodas and increase your water intake.

## 2023-12-06 ENCOUNTER — Ambulatory Visit: Payer: Self-pay | Admitting: Family Medicine

## 2023-12-06 LAB — COMPREHENSIVE METABOLIC PANEL WITH GFR
ALT: 40 IU/L — ABNORMAL HIGH (ref 0–32)
AST: 26 IU/L (ref 0–40)
Albumin: 4.3 g/dL (ref 3.8–4.9)
Alkaline Phosphatase: 114 IU/L (ref 44–121)
BUN/Creatinine Ratio: 13 (ref 9–23)
BUN: 13 mg/dL (ref 6–24)
Bilirubin Total: 1.1 mg/dL (ref 0.0–1.2)
CO2: 24 mmol/L (ref 20–29)
Calcium: 9.5 mg/dL (ref 8.7–10.2)
Chloride: 102 mmol/L (ref 96–106)
Creatinine, Ser: 1.01 mg/dL — ABNORMAL HIGH (ref 0.57–1.00)
Globulin, Total: 2.5 g/dL (ref 1.5–4.5)
Glucose: 86 mg/dL (ref 70–99)
Potassium: 4.6 mmol/L (ref 3.5–5.2)
Sodium: 139 mmol/L (ref 134–144)
Total Protein: 6.8 g/dL (ref 6.0–8.5)
eGFR: 67 mL/min/1.73 (ref >59)

## 2023-12-06 LAB — LIPID PANEL
Chol/HDL Ratio: 3.3 ratio (ref 0.0–4.4)
Cholesterol, Total: 143 mg/dL (ref 100–199)
HDL: 44 mg/dL (ref >39)
LDL Chol Calc (NIH): 75 mg/dL (ref 0–99)
Triglycerides: 134 mg/dL (ref 0–149)
VLDL Cholesterol Cal: 24 mg/dL (ref 5–40)

## 2023-12-17 ENCOUNTER — Other Ambulatory Visit: Payer: Self-pay | Admitting: Cardiology

## 2023-12-19 ENCOUNTER — Other Ambulatory Visit: Payer: Self-pay

## 2023-12-19 MED ORDER — SACUBITRIL-VALSARTAN 24-26 MG PO TABS
1.0000 | ORAL_TABLET | Freq: Two times a day (BID) | ORAL | 1 refills | Status: AC
Start: 2023-12-19 — End: ?

## 2024-01-09 ENCOUNTER — Telehealth: Payer: Self-pay

## 2024-01-09 NOTE — Telephone Encounter (Signed)
 Received a request from [facility] (Ubrelvy )requesting a new rx,the request can be faxed to [facility] at [phone] I have faxed the request form to provider office today and have index the request form.

## 2024-01-09 NOTE — Telephone Encounter (Signed)
 Patient does not need Entresto , was sent by cards in July. There was a message attached to it for ubrelvy .

## 2024-01-09 NOTE — Telephone Encounter (Signed)
 [facility] IS requesting refill Ubrelvy  50mg   #10

## 2024-01-29 ENCOUNTER — Encounter: Payer: Self-pay | Admitting: Family Medicine

## 2024-01-29 NOTE — Telephone Encounter (Signed)
 Scheduled patient for 2:00 01/30/24 with Dr. Randol.

## 2024-01-29 NOTE — Progress Notes (Unsigned)
 No chief complaint on file.     PMH, PSH, SH reviewed   ROS:    PHYSICAL EXAM:  There were no vitals taken for this visit.      ASSESSMENT/PLAN:

## 2024-01-30 ENCOUNTER — Ambulatory Visit (INDEPENDENT_AMBULATORY_CARE_PROVIDER_SITE_OTHER): Admitting: Family Medicine

## 2024-01-30 ENCOUNTER — Encounter: Payer: Self-pay | Admitting: Family Medicine

## 2024-01-30 VITALS — BP 108/58 | HR 84 | Ht 66.0 in | Wt 185.0 lb

## 2024-01-30 DIAGNOSIS — E66812 Obesity, class 2: Secondary | ICD-10-CM

## 2024-01-30 DIAGNOSIS — I5042 Chronic combined systolic (congestive) and diastolic (congestive) heart failure: Secondary | ICD-10-CM | POA: Diagnosis not present

## 2024-01-30 DIAGNOSIS — E1169 Type 2 diabetes mellitus with other specified complication: Secondary | ICD-10-CM

## 2024-01-30 DIAGNOSIS — Z23 Encounter for immunization: Secondary | ICD-10-CM

## 2024-01-30 DIAGNOSIS — M109 Gout, unspecified: Secondary | ICD-10-CM

## 2024-01-30 DIAGNOSIS — Z6839 Body mass index (BMI) 39.0-39.9, adult: Secondary | ICD-10-CM | POA: Diagnosis not present

## 2024-01-30 MED ORDER — IBUPROFEN 800 MG PO TABS: 800.0000 mg | ORAL_TABLET | Freq: Three times a day (TID) | ORAL | 0 refills | Status: DC | PRN

## 2024-01-30 NOTE — Patient Instructions (Addendum)
 ACUTE IDIOPATHIC GOUT FLARE OF LEFT BIG TOE: You have a gout flare in your left big toe with classic symptoms. No dietary or medication triggers were identified. -We will order a uric acid level test to check your uric acid levels. -You are prescribed ibuprofen  800 mg to be taken every 8 hours with food as needed for pain. -Follow a low purine diet and be aware of potential triggers that could cause gout flares. -If your uric acid levels are high outside of a flare, we may consider using allopurinol for prevention. -If the pain continues and your uric acid levels are normal, we may consider an x-ray.  We gave you a flu shot today, and your first shingles shot. We realized after that you should have gotten the shingles vaccine from the pharmacy (not our office) You rsecond dose will be due in 2 months. You will need to get this from the pharmacy.

## 2024-01-31 ENCOUNTER — Ambulatory Visit: Payer: Self-pay | Admitting: Family Medicine

## 2024-01-31 LAB — URIC ACID: Uric Acid: 3.3 mg/dL (ref 3.0–7.2)

## 2024-02-29 ENCOUNTER — Other Ambulatory Visit: Payer: Self-pay | Admitting: Family Medicine

## 2024-02-29 DIAGNOSIS — E118 Type 2 diabetes mellitus with unspecified complications: Secondary | ICD-10-CM

## 2024-03-06 ENCOUNTER — Encounter: Payer: Self-pay | Admitting: Family Medicine

## 2024-03-06 NOTE — Telephone Encounter (Signed)
 Hello!  FYI, patient has initiated the Ubrelvy  renewal process.  PCP has completed prescriber portion. Patient is coming by office this week to complete her portion of Ubrelvy  application. Forwarding to CPhT for awareness and so spreadsheet can be updated.

## 2024-03-11 ENCOUNTER — Ambulatory Visit: Attending: Cardiology | Admitting: Cardiology

## 2024-03-11 ENCOUNTER — Encounter: Payer: Self-pay | Admitting: Cardiology

## 2024-03-11 VITALS — BP 98/60 | HR 102 | Ht 66.0 in | Wt 184.0 lb

## 2024-03-11 DIAGNOSIS — I952 Hypotension due to drugs: Secondary | ICD-10-CM

## 2024-03-11 DIAGNOSIS — Z79899 Other long term (current) drug therapy: Secondary | ICD-10-CM

## 2024-03-11 DIAGNOSIS — I428 Other cardiomyopathies: Secondary | ICD-10-CM

## 2024-03-11 NOTE — Progress Notes (Signed)
 Cardiology Office Note:    Date:  03/11/2024   ID:  Jillienne Egner, DOB 18-Feb-1971, MRN [id number]  PCP:  Randol Dawes, MD  Cardiologist:  Alva Kuenzel, DO  Electrophysiologist:  None   Referring MD: Randol Dawes, MD    I am having some palpitations  History of Present Illness:    Cuba Natarajan is a 53 y.o. female with a hx of nonischemic cardiomyopathy which based on chart review going back to 2000 she had a ejection fraction of 20%, Catheterization was done then when she lived in [place] , revealed normal coronaries.  In 2017 she had some chest pain.  Nuclear stress test was abnormal.  Diagnostic catheterization in March 2017 showed no significant coronary disease.  Her ejection fraction then was 45 to 50%, diabetes mellitus on metformin  and Jardiance , hypertension, GERD.   Since her last visit with me she hsa been doing well.  She tell me she is in a good space.  She recently for her birthday in April went to dinner for with her daughter.  She denies any chest pain or shortness of breath.  Denies any lightheadedness or dizziness or any syncope episode.  Past Medical History:  Diagnosis Date   Allergy    Arthritis    Hands, Knees RT>LT   BENIGN NEOPLASM OF SKIN SITE UNSPECIFIED    benign mole   Blood transfusion without reported diagnosis    BUNIONS, BILATERAL    CARDIOMYOPATHY 02/1999   EF 20% in 02/1999, improved over time-  EF 40-45% at cath 2017   Chest pain    normal coronaries 2000 and 2017 (after an abnormal Myoview)   CHF (congestive heart failure) (HCC)    DEPRESSION    DIABETES MELLITUS, TYPE II, CONTROLLED, MILD    DYSLIPIDEMIA    GERD (gastroesophageal reflux disease)    Hypotension    November, 2012   METABOLIC SYNDROME X    hypertriglycerides 04/2008, hyperglycemia   MIGRAINE HEADACHE    NASH (nonalcoholic steatohepatitis)    OBESITY    Shingles (herpes zoster) polyneuropathy    Sinus tachycardia     Past Surgical History:  Procedure Laterality Date    BUNIONECTOMY  2012   LEFT   BUNIONECTOMY Right    CARDIAC CATHETERIZATION  2000   no CAD   CARDIAC CATHETERIZATION N/A 08/25/2015   Procedure: Left Heart Cath and Coronary Angiography;  Surgeon: Debby DELENA Sor, MD;  Location: [facility];  Service: Cardiovascular;  Laterality: N/A;   heart biopsy     mole removed     right arm age 80   RIGHT/LEFT HEART CATH AND CORONARY ANGIOGRAPHY N/A 05/18/2021   Procedure: RIGHT/LEFT HEART CATH AND CORONARY ANGIOGRAPHY;  Surgeon: Verlin Lonni BIRCH, MD;  Location: [facility];  Service: Cardiovascular;  Laterality: N/A;   vein scope      Current Medications: Current Meds  Medication Sig   ARIPiprazole (ABILIFY) 15 MG tablet Take 15 mg by mouth daily.   aspirin -acetaminophen -caffeine (EXCEDRIN MIGRAINE) 250-250-65 MG tablet Take 3 tablets by mouth daily as needed for headache.   atorvastatin  (LIPITOR) 10 MG tablet TAKE 1 TABLET BY MOUTH EVERY DAY   b complex vitamins tablet Take 1 tablet by mouth daily.   Blood Glucose Monitoring Suppl DEVI 1 each by Does not apply route in the morning, at noon, and at bedtime. May substitute to any manufacturer covered by patient's insurance.   carvedilol  (COREG ) 12.5 MG tablet Take 1 tablet (12.5 mg total) by mouth 2 (  two) times daily.   cetirizine (ZYRTEC) 10 MG tablet Take 10 mg by mouth daily.     Cholecalciferol (DIALYVITE VITAMIN D  5000) 125 MCG (5000 UT) capsule Take 5,000 Units by mouth daily.   Collagen-Boron-Hyaluronic Acid (MOVE FREE ULTRA JOINT HEALTH) 40-5-3.3 MG TABS Take 1 tablet by mouth daily.   empagliflozin  (JARDIANCE ) 25 MG TABS tablet TAKE 1 TABLET(25 MG) BY MOUTH DAILY   esomeprazole (NEXIUM) 20 MG capsule Take 20 mg by mouth daily.   Eszopiclone 3 MG TABS Take 3 mg by mouth at bedtime.   FLUoxetine  (PROZAC ) 40 MG capsule Take 80 mg by mouth daily.   fluticasone  (FLONASE ) 50 MCG/ACT nasal spray Place 2 sprays into both nostrils daily.   furosemide  (LASIX ) 40 MG tablet  Take 1 tablet (40 mg total) by mouth once a week.   ibuprofen  (ADVIL ) 800 MG tablet Take 1 tablet (800 mg total) by mouth every 8 (eight) hours as needed for moderate pain (pain score 4-6). Take with food   Ibuprofen  200 MG CAPS Take 200 mg by mouth as needed.   lamoTRIgine  (LAMICTAL ) 200 MG tablet Take 400 mg by mouth daily.   olopatadine (PATANOL) 0.1 % ophthalmic solution Place 1 drop into both eyes 2 (two) times daily as needed for allergies.   Omega-3 Fatty Acids (FISH OIL) 1000 MG CAPS Take 1,000 mg by mouth daily.   Probiotic Product (PROBIOTIC PO) Take 1 capsule by mouth daily.   sacubitril -valsartan  (ENTRESTO ) 24-26 MG Take 1 tablet by mouth 2 (two) times daily.   spironolactone  (ALDACTONE ) 25 MG tablet Take 0.5 tablets (12.5 mg total) by mouth daily.   tirzepatide  (MOUNJARO ) 10 MG/0.5ML Pen Inject 10 mg into the skin once a week.   Ubrogepant  (UBRELVY ) 50 MG TABS Take 1-2 tablets (50-100 mg total) by mouth daily as needed (migraine).   Current Facility-Administered Medications for the 03/11/24 encounter (Office Visit) with Estreya Clay, DO  Medication   levonorgestrel  (MIRENA ) 20 MCG/24HR IUD     Allergies:   Sulfonamide derivatives   Social History   Socioeconomic History   Marital status: Legally Separated    Spouse name: Not on file   Number of children: 1   Years of education: 16   Highest education level: Not on file  Occupational History   Occupation: Disabled  Tobacco Use   Smoking status: Never   Smokeless tobacco: Never  Vaping Use   Vaping status: Never Used  Substance and Sexual Activity   Alcohol use: Not Currently   Drug use: No   Sexual activity: Not Currently    Birth control/protection: Other-see comments    Comment: TUBAL LIGATION, 1st intercourse- 20, partners- 3  Other Topics Concern   Not on file  Social History Narrative   Separated 08/2021,    Lives with 2 cats   1 daughter, married, moved back to [place] (      Has 2 brothers and a  sister--all adopted (not blood-related)      Epworth Sleepiness Scale = 10 (as of 07/21/2015)      Disabled (related to heart/cardiomyopathy)      Updated 07/2023   Social Drivers of Health   Financial Resource Strain: Low Risk  (03/29/2023)   Overall Financial Resource Strain (CARDIA)    Difficulty of Paying Living Expenses: Not hard at all  Food Insecurity: No Food Insecurity (03/29/2023)   Hunger Vital Sign    Worried About Running Out of Food in the Last Year: Never true  Ran Out of Food in the Last Year: Never true  Transportation Needs: No Transportation Needs (03/29/2023)   PRAPARE - Administrator, Civil Service (Medical): No    Lack of Transportation (Non-Medical): No  Physical Activity: Insufficiently Active (03/29/2023)   Exercise Vital Sign    Days of Exercise per Week: 3 days    Minutes of Exercise per Session: 30 min  Stress: No Stress Concern Present (03/29/2023)   [employer] of Occupational Health - Occupational Stress Questionnaire    Feeling of Stress : Only a little  Social Connections: Unknown (03/29/2023)   Social Connection and Isolation Panel    Frequency of Communication with Friends and Family: More than three times a week    Frequency of Social Gatherings with Friends and Family: Twice a week    Attends Religious Services: Patient declined    Database administrator or Organizations: No    Attends Engineer, structural: Not on file    Marital Status: Married     Family History: The patient's family history includes Arthritis in an other family member; Cancer in her mother; Diabetes in her daughter, father, and mother; Diabetes type II in her mother; Heart attack in her father; Heart failure in her father; Hyperlipidemia in her mother and another family member; Hypertension in an other family member. There is no history of Colon cancer, Colon polyps, Esophageal cancer, Rectal cancer, or Stomach cancer.  ROS:   Review of Systems   Constitution: Negative for decreased appetite, fever and weight gain.  HENT: Negative for congestion, ear discharge, hoarse voice and sore throat.   Eyes: Negative for discharge, redness, vision loss in right eye and visual halos.  Cardiovascular: Negative for chest pain, dyspnea on exertion, leg swelling, orthopnea and palpitations.  Respiratory: Negative for cough, hemoptysis, shortness of breath and snoring.   Endocrine: Negative for heat intolerance and polyphagia.  Hematologic/Lymphatic: Negative for bleeding problem. Does not bruise/bleed easily.  Skin: Negative for flushing, nail changes, rash and suspicious lesions.  Musculoskeletal: Negative for arthritis, joint pain, muscle cramps, myalgias, neck pain and stiffness.  Gastrointestinal: Negative for abdominal pain, bowel incontinence, diarrhea and excessive appetite.  Genitourinary: Negative for decreased libido, genital sores and incomplete emptying.  Neurological: Negative for brief paralysis, focal weakness, headaches and loss of balance.  Psychiatric/Behavioral: Negative for altered mental status, depression and suicidal ideas.  Allergic/Immunologic: Negative for HIV exposure and persistent infections.    EKGs/Labs/Other Studies Reviewed:    The following studies were reviewed today:   EKG: EKG shows sinus tachycardia, heart rate 103 bpm.  R/L heart catheterization No angiographic evidence of CAD Normal right and left heart pressures 3.   Non-ischemic cardiomyopathy   Recommendations: No further ischemic workup. Her cardiomyopathy is felt to be non-ischemic.      Transthoracic echocardiogram April 22, 2021 IMPRESSIONS     1. Left ventricular ejection fraction, by estimation, is 40%. The left  ventricle has moderately decreased function. The left ventricle  demonstrates regional wall motion abnormalities (see scoring  diagram/findings for description). There is mild  asymmetric left ventricular hypertrophy of the  infero-lateral segment.  Left ventricular diastolic parameters are consistent with Grade I  diastolic dysfunction (impaired relaxation).   2. Right ventricular systolic function is normal. The right ventricular  size is normal. Tricuspid regurgitation signal is inadequate for assessing  PA pressure.   3. The mitral valve is normal in structure. Trivial mitral valve  regurgitation. No evidence of mitral stenosis.  4. The aortic valve is tricuspid. Aortic valve regurgitation is not  visualized. No aortic stenosis is present.   5. The inferior vena cava is normal in size with greater than 50%  respiratory variability, suggesting right atrial pressure of 3 mmHg.   FINDINGS   Left Ventricle: Left ventricular ejection fraction, by estimation, is  40%. The left ventricle has moderately decreased function. The left  ventricle demonstrates regional wall motion abnormalities. The left  ventricular internal cavity size was normal in  size. There is mild asymmetric left ventricular hypertrophy of the  infero-lateral segment. Left ventricular diastolic parameters are  consistent with Grade I diastolic dysfunction (impaired relaxation).      LV Wall Scoring:  The apical lateral segment, apical septal segment, apical anterior  segment,  and apical inferior segment are hypokinetic.   Right Ventricle: The right ventricular size is normal. No increase in  right ventricular wall thickness. Right ventricular systolic function is  normal. Tricuspid regurgitation signal is inadequate for assessing PA  pressure.   Left Atrium: Left atrial size was normal in size.   Right Atrium: Right atrial size was normal in size.   Pericardium: There is no evidence of pericardial effusion.   Mitral Valve: The mitral valve is normal in structure. Trivial mitral  valve regurgitation. No evidence of mitral valve stenosis.   Tricuspid Valve: The tricuspid valve is normal in structure. Tricuspid  valve  regurgitation is trivial. No evidence of tricuspid stenosis.   Aortic Valve: The aortic valve is tricuspid. Aortic valve regurgitation is  not visualized. No aortic stenosis is present.   Pulmonic Valve: The pulmonic valve was normal in structure. Pulmonic valve  regurgitation is trivial. No evidence of pulmonic stenosis.   Aorta: The aortic root is normal in size and structure.   Venous: The inferior vena cava is normal in size with greater than 50%  respiratory variability, suggesting right atrial pressure of 3 mmHg.   IAS/Shunts: The interatrial septum was not well visualized  Recent Labs: 03/29/2023: Hemoglobin 15.4; Platelets 418; TSH 2.620 06/22/2023: Magnesium 2.2 12/05/2023: ALT 40; BUN 13; Creatinine, Ser 1.01; Potassium 4.6; Sodium 139  Recent Lipid Panel    Component Value Date/Time   CHOL 143 12/05/2023 1421   TRIG 134 12/05/2023 1421   HDL 44 12/05/2023 1421   CHOLHDL 3.3 12/05/2023 1421   CHOLHDL 4.5 03/29/2016 0907   VLDL 78 (H) 03/29/2016 0907   LDLCALC 75 12/05/2023 1421   LDLDIRECT 130.8 01/15/2014 1209    Physical Exam:    VS:  BP 98/60 (BP Location: Left Arm, Patient Position: Sitting, Cuff Size: Normal)   Pulse (!) 102   Ht 5' 6 (1.676 m)   Wt 184 lb (83.5 kg)   SpO2 96%   BMI 29.70 kg/m     Wt Readings from Last 3 Encounters:  03/11/24 184 lb (83.5 kg)  01/30/24 185 lb (83.9 kg)  12/05/23 187 lb 9.6 oz (85.1 kg)     GEN: Well nourished, well developed in no acute distress HEENT: Normal NECK: No JVD; No carotid bruits LYMPHATICS: No lymphadenopathy CARDIAC: S1S2 noted,RRR, no murmurs, rubs, gallops RESPIRATORY:  Clear to auscultation without rales, wheezing or rhonchi  ABDOMEN: Soft, non-tender, non-distended, +bowel sounds, no guarding. EXTREMITIES: No edema, No cyanosis, no clubbing MUSCULOSKELETAL:  No deformity  SKIN: Warm and dry NEUROLOGIC:  Alert and oriented x 3, non-focal PSYCHIATRIC:  Normal affect, good insight  ASSESSMENT:     1. Nonischemic cardiomyopathy (HCC)   2. Medication  management   3. Hypotension due to drugs     PLAN:   No changes made in her medication regimen today.  Systolic blood pressure is 98 mmHg but she is completely asymptomatic we will monitor her closely. She is going to need a repeat echocardiogram in January 2026 this has been scheduled  Lifestyle modification advised.   Continue with current dose of statin.    The patient understands the need to lose weight with diet and exercise. We have discussed specific strategies for this.  The patient is in agreement with the above plan. The patient left the office in stable condition.  The patient will follow up in 12months or sooner if needed.   Medication Adjustments/Labs and Tests Ordered: Current medicines are reviewed at length with the patient today.  Concerns regarding medicines are outlined above.  Orders Placed This Encounter  Procedures   Comp Met (CMET)   Magnesium   EKG 12-Lead   ECHOCARDIOGRAM COMPLETE   No orders of the defined types were placed in this encounter.   Patient Instructions  Medication Instructions:  Your physician recommends that you continue on your current medications as directed. Please refer to the Current Medication list given to you today.  *If you need a refill on your cardiac medications before your next appointment, please call your pharmacy*  Lab Work: CMET, Mag If you have labs (blood work) drawn today and your tests are completely normal, you will receive your results only by: MyChart Message (if you have MyChart) OR A paper copy in the mail If you have any lab test that is abnormal or we need to change your treatment, we will call you to review the results.  Testing/Procedures: Your physician has requested that you have an echocardiogram - after Jan 9th. Echocardiography is a painless test that uses sound waves to create images of your heart. It provides your doctor with information  about the size and shape of your heart and how well your heart's chambers and valves are working. This procedure takes approximately one hour. There are no restrictions for this procedure. Please do NOT wear cologne, perfume, aftershave, or lotions (deodorant is allowed). Please arrive 15 minutes prior to your appointment time.  Please note: We ask at that you not bring children with you during ultrasound (echo/ vascular) testing. Due to room size and safety concerns, children are not allowed in the ultrasound rooms during exams. Our front office staff cannot provide observation of children in our lobby area while testing is being conducted. An adult accompanying a patient to their appointment will only be allowed in the ultrasound room at the discretion of the ultrasound technician under special circumstances. We apologize for any inconvenience.   Follow-Up: At [facility], you and your health needs are our priority.  As part of our continuing mission to provide you with exceptional heart care, our providers are all part of one team.  This team includes your primary Cardiologist (physician) and Advanced Practice Providers or APPs (Physician Assistants and Nurse Practitioners) who all work together to provide you with the care you need, when you need it.  Your next appointment:   1 year(s)  Provider:   Valory Wetherby, DO         Adopting a Healthy Lifestyle.  Know what a healthy weight is for you (roughly BMI <25) and aim to maintain this   Aim for 7+ servings of fruits and vegetables daily   65-80+ fluid ounces of water or unsweet  tea for healthy kidneys   Limit to max 1 drink of alcohol per day; avoid smoking/tobacco   Limit animal fats in diet for cholesterol and heart health - choose grass fed whenever available   Avoid highly processed foods, and foods high in saturated/trans fats   Aim for low stress - take time to unwind and care for your mental health   Aim for 150  min of moderate intensity exercise weekly for heart health, and weights twice weekly for bone health   Aim for 7-9 hours of sleep daily   When it comes to diets, agreement about the perfect plan isnt easy to find, even among the experts. Experts at the [facility] of [employer] developed an idea known as the Healthy Eating Plate. Just imagine a plate divided into logical, healthy portions.   The emphasis is on diet quality:   Load up on vegetables and fruits - one-half of your plate: Aim for color and variety, and remember that potatoes dont count.   Go for whole grains - one-quarter of your plate: Whole wheat, barley, wheat berries, quinoa, oats, brown rice, and foods made with them. If you want pasta, go with whole wheat pasta.   Protein power - one-quarter of your plate: Fish, chicken, beans, and nuts are all healthy, versatile protein sources. Limit red meat.   The diet, however, does go beyond the plate, offering a few other suggestions.   Use healthy plant oils, such as olive, canola, soy, corn, sunflower and peanut. Check the labels, and avoid partially hydrogenated oil, which have unhealthy trans fats.   If youre thirsty, drink water. Coffee and tea are good in moderation, but skip sugary drinks and limit milk and dairy products to one or two daily servings.   The type of carbohydrate in the diet is more important than the amount. Some sources of carbohydrates, such as vegetables, fruits, whole grains, and beans-are healthier than others.   Finally, stay active  Signed, Dub Huntsman, DO  03/11/2024 5:01 PM    [facility]

## 2024-03-11 NOTE — Patient Instructions (Signed)
 Medication Instructions:  Your physician recommends that you continue on your current medications as directed. Please refer to the Current Medication list given to you today.  *If you need a refill on your cardiac medications before your next appointment, please call your pharmacy*  Lab Work: CMET, Mag If you have labs (blood work) drawn today and your tests are completely normal, you will receive your results only by: MyChart Message (if you have MyChart) OR A paper copy in the mail If you have any lab test that is abnormal or we need to change your treatment, we will call you to review the results.  Testing/Procedures: Your physician has requested that you have an echocardiogram - after Jan 9th. Echocardiography is a painless test that uses sound waves to create images of your heart. It provides your doctor with information about the size and shape of your heart and how well your heart's chambers and valves are working. This procedure takes approximately one hour. There are no restrictions for this procedure. Please do NOT wear cologne, perfume, aftershave, or lotions (deodorant is allowed). Please arrive 15 minutes prior to your appointment time.  Please note: We ask at that you not bring children with you during ultrasound (echo/ vascular) testing. Due to room size and safety concerns, children are not allowed in the ultrasound rooms during exams. Our front office staff cannot provide observation of children in our lobby area while testing is being conducted. An adult accompanying a patient to their appointment will only be allowed in the ultrasound room at the discretion of the ultrasound technician under special circumstances. We apologize for any inconvenience.   Follow-Up: At [facility], you and your health needs are our priority.  As part of our continuing mission to provide you with exceptional heart care, our providers are all part of one team.  This team includes your  primary Cardiologist (physician) and Advanced Practice Providers or APPs (Physician Assistants and Nurse Practitioners) who all work together to provide you with the care you need, when you need it.  Your next appointment:   1 year(s)  Provider:   Kardie Tobb, DO

## 2024-03-12 LAB — COMPREHENSIVE METABOLIC PANEL WITH GFR
ALT: 42 IU/L — ABNORMAL HIGH (ref 0–32)
AST: 29 IU/L (ref 0–40)
Albumin: 4.5 g/dL (ref 3.8–4.9)
Alkaline Phosphatase: 129 IU/L (ref 49–135)
BUN/Creatinine Ratio: 13 (ref 9–23)
BUN: 14 mg/dL (ref 6–24)
Bilirubin Total: 1 mg/dL (ref 0.0–1.2)
CO2: 25 mmol/L (ref 20–29)
Calcium: 10.1 mg/dL (ref 8.7–10.2)
Chloride: 100 mmol/L (ref 96–106)
Creatinine, Ser: 1.11 mg/dL — ABNORMAL HIGH (ref 0.57–1.00)
Globulin, Total: 2.5 g/dL (ref 1.5–4.5)
Glucose: 99 mg/dL (ref 70–99)
Potassium: 4.8 mmol/L (ref 3.5–5.2)
Sodium: 140 mmol/L (ref 134–144)
Total Protein: 7 g/dL (ref 6.0–8.5)
eGFR: 60 mL/min/1.73 (ref >59)

## 2024-03-12 LAB — MAGNESIUM: Magnesium: 2.3 mg/dL (ref 1.6–2.3)

## 2024-03-13 ENCOUNTER — Telehealth: Payer: Self-pay

## 2024-03-13 ENCOUNTER — Ambulatory Visit: Payer: Self-pay | Admitting: Cardiology

## 2024-03-13 NOTE — Progress Notes (Signed)
   03/13/2024  Patient ID: Carolyn Salazar, female   DOB: 05-02-1971, 53 y.o.   MRN: [id number]  Completed application for Ubrelvy  PAP through [employer] has been submitted via fax, pending response.  Jon VEAR Lindau, PharmD Clinical Pharmacist [phone]

## 2024-03-18 ENCOUNTER — Telehealth: Payer: Self-pay | Admitting: Family Medicine

## 2024-03-18 NOTE — Telephone Encounter (Unsigned)
 Copied from CRM [id number]. Topic: General - Other >> Mar 18, 2024  4:37 PM Donee H wrote: Reason for CRM: Patient called requesting direct line to speak to someone regarding prescription assistance or have them give her a call. Please follow up with patient on request. She stated it's usually for financial assistance for medication help.  [phone]

## 2024-03-19 ENCOUNTER — Telehealth: Payer: Self-pay

## 2024-03-19 NOTE — Telephone Encounter (Signed)
 Call [employer] company pt requesting to call regarding her re-enrollment application for 2026,spoke with a representative explain they talked with pt on 10/28 let pt know she needs to provide with proof of income and submitted to [employer] to continue with application.

## 2024-03-19 NOTE — Telephone Encounter (Signed)
 Please see note & follow up with patient

## 2024-03-19 NOTE — Telephone Encounter (Signed)
 Attempted to return patient's call, had to leave a voicemail.

## 2024-04-03 ENCOUNTER — Telehealth: Payer: Self-pay

## 2024-04-03 NOTE — Progress Notes (Signed)
   04/03/2024  Patient ID: Carolyn Salazar, female   DOB: [birth year], 53 y.o.   MRN: [id number]  [employer] requested patient's proof of income, which patient has provided. Faxed into company for processing, pending response.  Jon VEAR Lindau, PharmD Clinical Pharmacist [phone]

## 2024-04-08 NOTE — Patient Instructions (Incomplete)
 HEALTH MAINTENANCE RECOMMENDATIONS:  It is recommended that you get at least 30 minutes of aerobic exercise at least 5 days/week (for weight loss, you may need as much as 60-90 minutes). This can be any activity that gets your heart rate up. This can be divided in 10-15 minute intervals if needed, but try and build up your endurance at least once a week.  Weight bearing exercise is also recommended twice weekly.  Eat a healthy diet with lots of vegetables, fruits and fiber.  Colorful foods have a lot of vitamins (ie green vegetables, tomatoes, red peppers, etc).  Limit sweet tea, regular sodas and alcoholic beverages, all of which has a lot of calories and sugar.  Up to 1 alcoholic drink daily may be beneficial for women (unless trying to lose weight, watch sugars).  Drink a lot of water.  Calcium  recommendations are 1200-1500 mg daily (1500 mg for postmenopausal women or women without ovaries), and vitamin D  1000 IU daily.  This should be obtained from diet and/or supplements (vitamins), and calcium  should not be taken all at once, but in divided doses.  Monthly self breast exams and yearly mammograms for women over the age of 34 is recommended.  Sunscreen of at least SPF 30 should be used on all sun-exposed parts of the skin when outside between the hours of 10 am and 4 pm (not just when at beach or pool, but even with exercise, golf, tennis, and yard work!)  Use a sunscreen that says broad spectrum so it covers both UVA and UVB rays, and make sure to reapply every 1-2 hours.  Remember to change the batteries in your smoke detectors when changing your clock times in the spring and fall. Carbon monoxide detectors are recommended for your home.  Use your seat belt every time you are in a car, and please drive safely and not be distracted with cell phones and texting while driving.   Ms. Yeh , Thank you for taking time to come for your Medicare Wellness Visit. I appreciate your ongoing  commitment to your health goals. Please review the following plan we discussed and let me know if I can assist you in the future.   This is a list of the screening recommended for you and due dates:  Health Maintenance  Topic Date Due   Hepatitis B Vaccine (1 of 3 - 19+ 3-dose series) Never done   COVID-19 Vaccine (8 - 2025-26 season) 01/21/2024   Hemoglobin A1C  01/30/2024   Zoster (Shingles) Vaccine (2 of 2) 03/26/2024   Medicare Annual Wellness Visit  03/28/2024   Complete foot exam   03/28/2024   Pap with HPV screening  04/28/2024   Eye exam for diabetics  07/18/2024   Yearly kidney health urinalysis for diabetes  07/29/2024   Yearly kidney function blood test for diabetes  03/11/2025   Breast Cancer Screening  08/21/2025   DTaP/Tdap/Td vaccine (3 - Td or Tdap) 03/19/2029   Colon Cancer Screening  02/15/2031   Pneumococcal Vaccine for age over 53  Completed   Flu Shot  Completed   Hepatitis C Screening  Completed   HIV Screening  Completed   HPV Vaccine  Aged Out   Meningitis B Vaccine  Aged Out    Please get the 2nd shingles vaccine from the pharmacy.  Please schedule your GYN exam (you are likely due for pap smear, as well as for routine visit).  Please bring us  copies of your Living Will and Healthcare Power  of Attorney once completed and notarized so that it can be scanned into your medical chart.  I think it is a good idea to periodically check blood sugars (or at least have the ability to do so if you aren't feeling well, or change diet or medications.  Let us  know which test strips are covered by your insurance, and we can then send in a prescription to your pharmacy for the meter, strips, lancets.  I truly am not sure if RSV is recommended now vs waiting until age 22. With your underlying heart issues, if it is covered, it might be a good idea.  You can check with the pharmacy to see if it is covered (and if not, get at 60).

## 2024-04-08 NOTE — Progress Notes (Unsigned)
 No chief complaint on file.  Carolyn Salazar is a 53 y.o. female who presents for a complete physical and follow-up on chronic problems.   She is also being seen for initial AWV, see separate note.  She was last seen by me in September with report of gout flare in her left great toe.  Uric acid at that time was very low at 3.3.  We had discussed possibly rechecking level when not in a flare, and to consider x-rays if ongoing pain. She reports ***   Diabetes:  Mounjaro  was added to her regimen of metformin  and Jardiance  in March 2024, due to A1c being above goal at 7.4%. Metformin  was later stopped (when A1c normal).  Mounjaro  dose was titrated up to 10 mg at her visit in March, to further help with weight loss (she had noted increased appetite/hunger). She continues on Jardiance .  Appetite remains well controlled on the 10 mg dose. ***UPDATE Denies any nausea or vomiting. Constipation is still managed by high fiber diet (metamucil gummies 2x/d). Last A1c was 4.8% in 07/2023.  Sugars are running *** CGM is no longer covered by her insurance. She hasn't been checking sugars, didn't get glucometer. ***UPDATE  No hypoglycemia, polydipsia, polyuria. She checks her feet regularly and denies lesions/burning/concerns. She had diabetic eye exam in 06/2023, no retinopathy.   Obesity: In July she reported cutting back on nut portions, but was still drinking 32 oz of Diet Pepsi daily. (Plus 32 ounces of water daily.) She was encouraged to cut back on diet sodas. Today she reports ***  Wt Readings from Last 3 Encounters:  03/11/24 184 lb (83.5 kg)  01/30/24 185 lb (83.9 kg)  12/05/23 187 lb 9.6 oz (85.1 kg)      Nonischemic cardiomyopathy, under the care of Dr. Sheena.  She last saw her in October, no changes were made to her medications.  Last echocardiogram was in 05/2023, EF 40-45%, to be repeated in 05/2024. She continues on Entresto , Aldactone , Jardiance , carvedilol  12.5mg , and lasix  just once  a week.   BPs are not checked at home. She denies dizziness, headaches (other than menstrual migraines or occasional tension HA), chest pain, DOE.     Obstructive sleep apnea: She had sleep study in March 2023, revealing moderate OSA, but severe with supine sleep (pt states she never sleeps on her back, but was told to for the study).  Moderate oxygen desaturation down to 84%. She had never started CPAP, was very expensive, was waiting for her new insurance. Elected not to pursue that, and consider repeat sleep study after weight loss, since this might improve. She has lost weight. She denies unrefreshed sleep. Daytime somnolence has improved. Occasionally takes a nap.  Unaware of snoring--at last visit she reported she would ask a friend she had stayed in a hotel room with if she had noticed apnea. ***UPDATE   Mixed hyperlipidemia:  Patient reports compliance with atorvastatin  10mg  and  1 capsule of omega 3 fish oil daily, without side effects.   Lipids were at goal on last check in July.  Lab Results  Component Value Date   CHOL 143 12/05/2023   HDL 44 12/05/2023   LDLCALC 75 12/05/2023   LDLDIRECT 130.8 01/15/2014   TRIG 134 12/05/2023   CHOLHDL 3.3 12/05/2023    Migraines:  Ubrelvy  is effective for her. Migraines are hormonal.  Migraines are usually once a month, for 1-2 days. No aura.  She has Mirena --no cycles, but still gets hormonal changes/breast tenderness. ***  She gets Ubrelvy  through PAP.   Vitamin D  deficiency: Last level was 39.3 in 03/2022, when taking 5000 IU daily. She continues to take 5000 IU daily.  GERD:  This is controlled with taking Nexium 20 mg once daily in the morning.   ***UPDATE   Immunization History  Administered Date(s) Administered   Influenza Whole 04/10/2007, 01/31/2011   Influenza, Seasonal, Injecte, Preservative Fre 01/30/2024   Influenza,inj,Quad PF,6+ Mos 01/15/2014, 02/28/2016, 03/20/2018, 03/20/2019, 01/27/2020, 03/07/2021, 03/16/2022    Influenza-Unspecified 02/20/2012, 03/06/2023   PFIZER Comirnaty(Gray Top)Covid-19 Tri-Sucrose Vaccine 12/01/2020   PFIZER(Purple Top)SARS-COV-2 Vaccination 08/14/2019, 09/04/2019, 02/16/2020   PNEUMOCOCCAL CONJUGATE-20 07/30/2023   Pfizer Covid-19 Vaccine Bivalent Booster 50yrs & up 03/07/2021   Pfizer(Comirnaty)Fall Seasonal Vaccine 12 years and older 03/16/2022, 03/06/2023   Pneumococcal Polysaccharide-23 05/23/1999, 03/20/2019   Td 05/22/2008   Tdap 03/20/2019   Zoster Recombinant(Shingrix ) 01/30/2024   Last Pap smear: 04/2019--ASCUS/HPV HR negative. Last GYN visit was 03/2021 (pap not repeated, though prev rec from 04/2019 was for pap 1 year) Has Mirena  IUD (placed 05/2019 for menorrhagia) Last mammogram: 08/2023 Last colonoscopy: 01/2021 Dr. Shila; ulceration noted. 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 Last DEXA: never Dentist: once a year Ophtho: yearly Exercise:    seated stepper machine (using arms as well) at the gym 1-2x/week x 30 minutes.   No weight-bearing exercise  Does not have a living will or healthcare power of attorney.   PMH, PSH, SH reviewed      ROS:  Denies fever, headaches,  vision changes, decreased hearing, sore throat, breast concerns, chest pain, palpitations, dizziness, syncope, dyspnea on exertion, cough, swelling, nausea, vomiting, diarrhea, constipation, abdominal pain, melena, hematochezia, indigestion/heartburn, hematuria, incontinence, dysuria, vaginal discharge, odor or itch, genital lesions, joint pains, numbness, tingling, weakness, tremor, suspicious skin lesions, abnormal bleeding/bruising, or enlarged lymph nodes.  Moods are good, recently saw psych.  Constipation managed with diet and fiber gummies.    PHYSICAL EXAM:  There were no vitals taken for this visit.   Wt Readings from Last 3  Encounters:  03/11/24 184 lb (83.5 kg)  01/30/24 185 lb (83.9 kg)  12/05/23 187 lb 9.6 oz (85.1 kg)   General Appearance:    Alert, cooperative, no distress, appears stated age  Head:    Normocephalic, without obvious abnormality, atraumatic  Eyes:    PERRL, conjunctiva/corneas clear, EOM's intact  Ears:    Normal TM and external ear canal on L.  R TM obscured by cerumen  Nose:   Nasal mucosa with moderate edema, L>R, no erythema or purulence. Pale mucosa. Mildly tender sinuses x 4  Throat:   Normal mucosa  Neck:   Supple, no lymphadenopathy;  thyroid :  no enlargement/ tenderness/nodules; no JVD  Back:    Spine nontender, no curvature, ROM normal  Lungs:     Clear to auscultation bilaterally without wheezes, rales or ronchi; respirations unlabored  Chest Wall:    No tenderness or deformity   Heart:    tachycardic, rate of 108.  Regular rhythm, S1 and S2 normal, no murmur, rub or gallop  Breast Exam:    deferred to OB/GYN  Abdomen:     Soft, non-tender, nondistended, normoactive bowel sounds, no masses, no hepatosplenomegaly  Genitalia:    deferred to OB/GYN       Extremities:   No clubbing, cyanosis or edema  Pulses:   2+ and symmetric all extremities  Skin:  Skin color, texture, turgor normal, no rashes or lesions  Lymph nodes:   Cervical, supraclavicular, and axillary nodes normal  Neurologic:   CNII-XII intact, normal strength, sensation and gait                                Psych:   Normal mood, affect, hygiene and grooming.                 Diabetic foot exam--WHSS on both great toes (bunionectomies).  Normal monofilament exam  ***FOOT EXAM  Lab Results  Component Value Date   HGBA1C 4.8 07/30/2023     ASSESSMENT/PLAN:  Has she seen GYN?  We have no records Need any pap since 2020. Remind to schedule if last was 2022 (last she told us ).  Needs to get 2nd dose of shingrix  from the pharmacy, 2 weeks from today (if she hasn't already--we gave first in office, by  mistake). If she did get, verify date before giving COVID  Doesn't need c-met--just done by cardiology 4 weeks ago. Does need uric acid level rechecked Needs x-rays of L foot/great toe if she has had any ongoing or recurrent pain  Did she complete living will, HCPOA?  Forms given last year  ?keep at 10mg  or increase to 12.5 mounjaro ? ?when to repeat sleep study?  Discussed monthly self breast exams and yearly mammograms (past due, reminded to schedule); at least 30 minutes of aerobic activity at least 5 days/week, weight-bearing exercise at least 2x/week; proper sunscreen use reviewed; healthy diet, including goals of calcium  and vitamin D  intake and alcohol recommendations (less than or equal to 1 drink/day) reviewed; regular seatbelt use; changing batteries in smoke detectors.  Immunization recommendations discussed--continue yearly flu shots. COVID booster ***  Needs to get 2nd dose of Shingrix  from pharmacy. Colonoscopy recommendations reviewed, UTD. Due to f/u with GYN. ??***  We discussed living will and healthcare POA.  Forms given last year  Med check in 4-5 mos

## 2024-04-08 NOTE — Progress Notes (Unsigned)
 No chief complaint on file.    Subjective:   Carolyn Salazar is a 53 y.o. female who presents for a Medicare Annual Wellness Visit.  Allergies (verified) Sulfonamide derivatives   History: Past Medical History:  Diagnosis Date   Allergy    Arthritis    Hands, Knees RT>LT   BENIGN NEOPLASM OF SKIN SITE UNSPECIFIED    benign mole   Blood transfusion without reported diagnosis    BUNIONS, BILATERAL    CARDIOMYOPATHY 02/1999   EF 20% in 02/1999, improved over time-  EF 40-45% at cath 2017   Chest pain    normal coronaries 2000 and 2017 (after an abnormal Myoview)   CHF (congestive heart failure) (HCC)    DEPRESSION    DIABETES MELLITUS, TYPE II, CONTROLLED, MILD    DYSLIPIDEMIA    GERD (gastroesophageal reflux disease)    Hypotension    November, 2012   METABOLIC SYNDROME X    hypertriglycerides 04/2008, hyperglycemia   MIGRAINE HEADACHE    NASH (nonalcoholic steatohepatitis)    OBESITY    Shingles (herpes zoster) polyneuropathy    Sinus tachycardia    Past Surgical History:  Procedure Laterality Date   BUNIONECTOMY  2012   LEFT   BUNIONECTOMY Right    CARDIAC CATHETERIZATION  2000   no CAD   CARDIAC CATHETERIZATION N/A 08/25/2015   Procedure: Left Heart Cath and Coronary Angiography;  Surgeon: Debby DELENA Sor, MD;  Location: [facility];  Service: Cardiovascular;  Laterality: N/A;   heart biopsy     mole removed     right arm age 36   RIGHT/LEFT HEART CATH AND CORONARY ANGIOGRAPHY N/A 05/18/2021   Procedure: RIGHT/LEFT HEART CATH AND CORONARY ANGIOGRAPHY;  Surgeon: Verlin Lonni BIRCH, MD;  Location: [facility];  Service: Cardiovascular;  Laterality: N/A;   vein scope     Family History  Problem Relation Age of Onset   Diabetes Mother    Cancer Mother        Ovarian, benign mass   Diabetes type II Mother    Hyperlipidemia Mother    Diabetes Father    Heart failure Father    Heart attack Father    Hypertension Other        Parent    Hyperlipidemia Other        parent   Arthritis Other        parent, grandparent   Diabetes Daughter    Colon cancer Neg Hx    Colon polyps Neg Hx    Esophageal cancer Neg Hx    Rectal cancer Neg Hx    Stomach cancer Neg Hx    Social History   Occupational History   Occupation: Disabled  Tobacco Use   Smoking status: Never   Smokeless tobacco: Never  Vaping Use   Vaping status: Never Used  Substance and Sexual Activity   Alcohol use: Not Currently   Drug use: No   Sexual activity: Not Currently    Birth control/protection: Other-see comments    Comment: TUBAL LIGATION, 1st intercourse- 20, partners- 3   Tobacco Counseling Counseling given: Not Answered  SDOH Screenings   Food Insecurity: No Food Insecurity (03/29/2023)  Housing: Low Risk  (03/29/2023)  Transportation Needs: No Transportation Needs (03/29/2023)  Utilities: Not At Risk (03/29/2023)  Depression (PHQ2-9): Low Risk  (03/29/2023)  Financial Resource Strain: Low Risk  (03/29/2023)  Physical Activity: Insufficiently Active (03/29/2023)  Social Connections: Unknown (03/29/2023)  Stress: No Stress Concern Present (03/29/2023)  Tobacco Use: Low Risk  (03/11/2024)   See flowsheets for full screening details  Depression Screen     Goals Addressed   None    No data recorded      Objective:    There were no vitals filed for this visit. There is no height or weight on file to calculate BMI.  Current Medications (verified) Outpatient Encounter Medications as of 04/09/2024  Medication Sig   ARIPiprazole (ABILIFY) 15 MG tablet Take 15 mg by mouth daily.   aspirin -acetaminophen -caffeine (EXCEDRIN MIGRAINE) 250-250-65 MG tablet Take 3 tablets by mouth daily as needed for headache.   atorvastatin  (LIPITOR) 10 MG tablet TAKE 1 TABLET BY MOUTH EVERY DAY   b complex vitamins tablet Take 1 tablet by mouth daily.   Blood Glucose Monitoring Suppl DEVI 1 each by Does not apply route in the morning, at noon, and at bedtime.  May substitute to any manufacturer covered by patient's insurance.   carvedilol  (COREG ) 12.5 MG tablet Take 1 tablet (12.5 mg total) by mouth 2 (two) times daily.   cetirizine (ZYRTEC) 10 MG tablet Take 10 mg by mouth daily.     Cholecalciferol (DIALYVITE VITAMIN D  5000) 125 MCG (5000 UT) capsule Take 5,000 Units by mouth daily.   Collagen-Boron-Hyaluronic Acid (MOVE FREE ULTRA JOINT HEALTH) 40-5-3.3 MG TABS Take 1 tablet by mouth daily.   empagliflozin  (JARDIANCE ) 25 MG TABS tablet TAKE 1 TABLET(25 MG) BY MOUTH DAILY   esomeprazole (NEXIUM) 20 MG capsule Take 20 mg by mouth daily.   Eszopiclone 3 MG TABS Take 3 mg by mouth at bedtime.   FLUoxetine  (PROZAC ) 40 MG capsule Take 80 mg by mouth daily.   fluticasone  (FLONASE ) 50 MCG/ACT nasal spray Place 2 sprays into both nostrils daily.   furosemide  (LASIX ) 40 MG tablet Take 1 tablet (40 mg total) by mouth once a week.   ibuprofen  (ADVIL ) 800 MG tablet Take 1 tablet (800 mg total) by mouth every 8 (eight) hours as needed for moderate pain (pain score 4-6). Take with food   Ibuprofen  200 MG CAPS Take 200 mg by mouth as needed.   lamoTRIgine  (LAMICTAL ) 200 MG tablet Take 400 mg by mouth daily.   olopatadine (PATANOL) 0.1 % ophthalmic solution Place 1 drop into both eyes 2 (two) times daily as needed for allergies.   Omega-3 Fatty Acids (FISH OIL) 1000 MG CAPS Take 1,000 mg by mouth daily.   Probiotic Product (PROBIOTIC PO) Take 1 capsule by mouth daily.   sacubitril -valsartan  (ENTRESTO ) 24-26 MG Take 1 tablet by mouth 2 (two) times daily.   spironolactone  (ALDACTONE ) 25 MG tablet Take 0.5 tablets (12.5 mg total) by mouth daily.   tirzepatide  (MOUNJARO ) 10 MG/0.5ML Pen Inject 10 mg into the skin once a week.   Ubrogepant  (UBRELVY ) 50 MG TABS Take 1-2 tablets (50-100 mg total) by mouth daily as needed (migraine).   Facility-Administered Encounter Medications as of 04/09/2024  Medication   levonorgestrel  (MIRENA ) 20 MCG/24HR IUD   Hearing/Vision  screen No results found. Immunizations and Health Maintenance Health Maintenance  Topic Date Due   Hepatitis B Vaccines 19-59 Average Risk (1 of 3 - 19+ 3-dose series) Never done   COVID-19 Vaccine (8 - 2025-26 season) 01/21/2024   HEMOGLOBIN A1C  01/30/2024   Zoster Vaccines- Shingrix  (2 of 2) 03/26/2024   Medicare Annual Wellness (AWV)  03/28/2024   FOOT EXAM  03/28/2024   Cervical Cancer Screening (HPV/Pap Cotest)  04/28/2024   OPHTHALMOLOGY EXAM  07/18/2024   Diabetic kidney  evaluation - Urine ACR  07/29/2024   Diabetic kidney evaluation - eGFR measurement  03/11/2025   Mammogram  08/21/2025   DTaP/Tdap/Td (3 - Td or Tdap) 03/19/2029   Colonoscopy  02/15/2031   Pneumococcal Vaccine: 50+ Years  Completed   Influenza Vaccine  Completed   Hepatitis C Screening  Completed   HIV Screening  Completed   HPV VACCINES  Aged Out   Meningococcal B Vaccine  Aged Out        Assessment/Plan:  This is a routine wellness examination for [place].  Patient Care Team: Randol Dawes, MD as PCP - General (Family Medicine) Tobb, Kardie, DO as PCP - Cardiology (Cardiology) Micky Reyes BIRCH, MD (Cardiology) Lauretha Victory DEL, MD as Consulting Physician (Gynecology) Deterding, Lynwood, MD (Nephrology) Lelon Glendia DASEN, PA-C (Cardiology) Winfred Curlee DEL, MD (Inactive) (Obstetrics and Gynecology) Francella Fairy SAILOR, OD (Optometry) Shila Gustav GAILS, MD as Consulting Physician (Gastroenterology) Morgan Clayborne CROME, RN as [facility] Management Darden Planas, NP as Nurse Practitioner  I have personally reviewed and noted the following in the patient's chart:   Medical and social history Use of alcohol, tobacco or illicit drugs  Current medications and supplements including opioid prescriptions. Functional ability and status Nutritional status Physical activity Advanced directives List of other physicians Hospitalizations, surgeries, and ER visits in previous 12 months Vitals Screenings to include  cognitive, depression, and falls Referrals and appointments  No orders of the defined types were placed in this encounter.  In addition, I have reviewed and discussed with patient certain preventive protocols, quality metrics, and best practice recommendations. A written personalized care plan for preventive services as well as general preventive health recommendations were provided to patient.   Lucienne JULIANNA Fuse, RMA   04/08/2024   No follow-ups on file.  After Visit Summary: (In Person-Printed) AVS printed and given to the patient  Nurse Notes: ***

## 2024-04-09 ENCOUNTER — Ambulatory Visit: Payer: Self-pay | Admitting: Family Medicine

## 2024-04-09 ENCOUNTER — Encounter: Payer: Self-pay | Admitting: Family Medicine

## 2024-04-09 VITALS — BP 94/60 | HR 84 | Ht 66.0 in | Wt 183.0 lb

## 2024-04-09 DIAGNOSIS — I5042 Chronic combined systolic (congestive) and diastolic (congestive) heart failure: Secondary | ICD-10-CM | POA: Diagnosis not present

## 2024-04-09 DIAGNOSIS — K7581 Nonalcoholic steatohepatitis (NASH): Secondary | ICD-10-CM

## 2024-04-09 DIAGNOSIS — E118 Type 2 diabetes mellitus with unspecified complications: Secondary | ICD-10-CM

## 2024-04-09 DIAGNOSIS — Z23 Encounter for immunization: Secondary | ICD-10-CM

## 2024-04-09 DIAGNOSIS — G4733 Obstructive sleep apnea (adult) (pediatric): Secondary | ICD-10-CM

## 2024-04-09 DIAGNOSIS — E559 Vitamin D deficiency, unspecified: Secondary | ICD-10-CM

## 2024-04-09 DIAGNOSIS — E1169 Type 2 diabetes mellitus with other specified complication: Secondary | ICD-10-CM

## 2024-04-09 DIAGNOSIS — E785 Hyperlipidemia, unspecified: Secondary | ICD-10-CM

## 2024-04-09 DIAGNOSIS — Z7984 Long term (current) use of oral hypoglycemic drugs: Secondary | ICD-10-CM

## 2024-04-09 DIAGNOSIS — Z79899 Other long term (current) drug therapy: Secondary | ICD-10-CM

## 2024-04-09 DIAGNOSIS — E782 Mixed hyperlipidemia: Secondary | ICD-10-CM

## 2024-04-09 DIAGNOSIS — Z6835 Body mass index (BMI) 35.0-35.9, adult: Secondary | ICD-10-CM

## 2024-04-09 DIAGNOSIS — E66812 Obesity, class 2: Secondary | ICD-10-CM

## 2024-04-09 DIAGNOSIS — Z0000 Encounter for general adult medical examination without abnormal findings: Secondary | ICD-10-CM | POA: Diagnosis not present

## 2024-04-09 DIAGNOSIS — M109 Gout, unspecified: Secondary | ICD-10-CM

## 2024-04-09 DIAGNOSIS — Z6839 Body mass index (BMI) 39.0-39.9, adult: Secondary | ICD-10-CM

## 2024-04-09 DIAGNOSIS — I428 Other cardiomyopathies: Secondary | ICD-10-CM

## 2024-04-09 DIAGNOSIS — Z7985 Long-term (current) use of injectable non-insulin antidiabetic drugs: Secondary | ICD-10-CM

## 2024-04-09 DIAGNOSIS — G43829 Menstrual migraine, not intractable, without status migrainosus: Secondary | ICD-10-CM

## 2024-04-09 LAB — POCT GLYCOSYLATED HEMOGLOBIN (HGB A1C): Hemoglobin A1C: 4.7 % (ref 4.0–5.6)

## 2024-04-09 LAB — LDL CHOLESTEROL, DIRECT

## 2024-04-10 ENCOUNTER — Other Ambulatory Visit: Payer: Self-pay | Admitting: *Deleted

## 2024-04-10 ENCOUNTER — Telehealth: Payer: Self-pay

## 2024-04-10 ENCOUNTER — Ambulatory Visit: Payer: Self-pay | Admitting: Family Medicine

## 2024-04-10 DIAGNOSIS — G43829 Menstrual migraine, not intractable, without status migrainosus: Secondary | ICD-10-CM

## 2024-04-10 LAB — CBC WITH DIFFERENTIAL/PLATELET
Basophils Absolute: 0.1 x10E3/uL (ref 0.0–0.2)
Basos: 1 %
EOS (ABSOLUTE): 0.3 x10E3/uL (ref 0.0–0.4)
Eos: 3 %
Hematocrit: 42.6 % (ref 34.0–46.6)
Hemoglobin: 14.1 g/dL (ref 11.1–15.9)
Immature Grans (Abs): 0 x10E3/uL (ref 0.0–0.1)
Immature Granulocytes: 0 %
Lymphocytes Absolute: 3.8 x10E3/uL — ABNORMAL HIGH (ref 0.7–3.1)
Lymphs: 39 %
MCH: 29.7 pg (ref 26.6–33.0)
MCHC: 33.1 g/dL (ref 31.5–35.7)
MCV: 90 fL (ref 79–97)
Monocytes Absolute: 0.6 x10E3/uL (ref 0.1–0.9)
Monocytes: 7 %
Neutrophils Absolute: 5.1 x10E3/uL (ref 1.4–7.0)
Neutrophils: 50 %
Platelets: 333 x10E3/uL (ref 150–450)
RBC: 4.75 x10E6/uL (ref 3.77–5.28)
RDW: 12.4 % (ref 11.7–15.4)
WBC: 9.9 x10E3/uL (ref 3.4–10.8)

## 2024-04-10 LAB — COMPREHENSIVE METABOLIC PANEL WITH GFR
ALT: 34 IU/L — ABNORMAL HIGH (ref 0–32)
AST: 25 IU/L (ref 0–40)
Albumin: 4.4 g/dL (ref 3.8–4.9)
Alkaline Phosphatase: 105 IU/L (ref 49–135)
BUN/Creatinine Ratio: 22 (ref 9–23)
BUN: 20 mg/dL (ref 6–24)
Bilirubin Total: 0.9 mg/dL (ref 0.0–1.2)
CO2: 21 mmol/L (ref 20–29)
Calcium: 10.3 mg/dL — ABNORMAL HIGH (ref 8.7–10.2)
Chloride: 102 mmol/L (ref 96–106)
Creatinine, Ser: 0.92 mg/dL (ref 0.57–1.00)
Globulin, Total: 2.3 g/dL (ref 1.5–4.5)
Glucose: 88 mg/dL (ref 70–99)
Potassium: 4.6 mmol/L (ref 3.5–5.2)
Sodium: 138 mmol/L (ref 134–144)
Total Protein: 6.7 g/dL (ref 6.0–8.5)
eGFR: 75 mL/min/1.73 (ref >59)

## 2024-04-10 LAB — T4, FREE: Free T4: 1.23 ng/dL (ref 0.82–1.77)

## 2024-04-10 LAB — LIPID PANEL
Chol/HDL Ratio: 3.4 ratio (ref 0.0–4.4)
Cholesterol, Total: 156 mg/dL (ref 100–199)
HDL: 46 mg/dL (ref >39)
LDL Chol Calc (NIH): 86 mg/dL (ref 0–99)
Triglycerides: 139 mg/dL (ref 0–149)
VLDL Cholesterol Cal: 24 mg/dL (ref 5–40)

## 2024-04-10 LAB — LDL CHOLESTEROL, DIRECT: LDL Direct: 85 mg/dL (ref 0–99)

## 2024-04-10 LAB — VITAMIN D 25 HYDROXY (VIT D DEFICIENCY, FRACTURES): Vit D, 25-Hydroxy: 90.4 ng/mL (ref 30.0–100.0)

## 2024-04-10 LAB — URIC ACID: Uric Acid: 3.4 mg/dL (ref 3.0–7.2)

## 2024-04-10 LAB — TSH: TSH: 1.96 u[IU]/mL (ref 0.450–4.500)

## 2024-04-10 MED ORDER — UBRELVY 50 MG PO TABS: 50.0000 mg | ORAL_TABLET | Freq: Every day | ORAL | 0 refills | Status: DC | PRN

## 2024-04-10 NOTE — Progress Notes (Signed)
   04/10/2024  Patient ID: Carolyn Salazar, female   DOB: 31-Jan-1971, 53 y.o.   MRN: [id number]  Received denial letter from [employer] for patient's Ubrelvy . Patient reports she now has medicaid, test claim shows rx would be no charge for patient at local pharmacy.  Will collaborate with prescriber for refill needs.  Jon VEAR Lindau, PharmD Clinical Pharmacist [phone]

## 2024-04-21 ENCOUNTER — Other Ambulatory Visit: Payer: Self-pay | Admitting: Cardiology

## 2024-05-03 ENCOUNTER — Other Ambulatory Visit: Payer: Self-pay | Admitting: Family Medicine

## 2024-06-03 ENCOUNTER — Encounter: Payer: Self-pay | Admitting: Family Medicine

## 2024-06-03 ENCOUNTER — Other Ambulatory Visit: Payer: Self-pay | Admitting: Family Medicine

## 2024-06-03 DIAGNOSIS — E118 Type 2 diabetes mellitus with unspecified complications: Secondary | ICD-10-CM

## 2024-06-04 ENCOUNTER — Ambulatory Visit (HOSPITAL_COMMUNITY)
Admission: RE | Admit: 2024-06-04 | Discharge: 2024-06-04 | Disposition: A | Discharge status: Home / Self Care | Source: Ambulatory Visit | Attending: Cardiology | Admitting: Cardiology

## 2024-06-04 ENCOUNTER — Telehealth: Payer: Self-pay | Admitting: *Deleted

## 2024-06-04 DIAGNOSIS — I428 Other cardiomyopathies: Secondary | ICD-10-CM | POA: Diagnosis present

## 2024-06-04 LAB — ECHOCARDIOGRAM COMPLETE: S' Lateral: 3.85 cm

## 2024-06-04 NOTE — Telephone Encounter (Signed)
 Patient advised.

## 2024-06-04 NOTE — Telephone Encounter (Signed)
 Her sugars have been extremely well controlled Lab Results  Component Value Date   HGBA1C 4.7 04/09/2024   She likely will be fine (though her appetite might increase). She should check her sugars. Hopefully she will be able to get it soon.

## 2024-06-04 NOTE — Telephone Encounter (Signed)
 Patient is out of Mounjaro , her medicaid is in process. She should have soon but wants to know what she should take in place?

## 2024-06-12 ENCOUNTER — Encounter: Payer: Self-pay | Admitting: Family Medicine

## 2024-06-23 ENCOUNTER — Other Ambulatory Visit: Payer: Self-pay | Admitting: Cardiology

## 2024-06-25 ENCOUNTER — Other Ambulatory Visit: Payer: Self-pay | Admitting: Family Medicine

## 2024-06-25 DIAGNOSIS — G43829 Menstrual migraine, not intractable, without status migrainosus: Secondary | ICD-10-CM

## 2024-06-25 NOTE — Telephone Encounter (Signed)
 Is this okay to refill?

## 2024-10-08 ENCOUNTER — Ambulatory Visit: Admitting: Family Medicine

## 2025-04-13 ENCOUNTER — Encounter: Admitting: Family Medicine
